# Patient Record
Sex: Female | Born: 1940 | Race: White | Hispanic: No | Marital: Married | Smoking: Never smoker
Health system: Southern US, Community
[De-identification: ages and names within clinical notes are randomized; demographics above are authoritative.]

## PROBLEM LIST (undated history)

## (undated) DIAGNOSIS — J309 Allergic rhinitis, unspecified: Secondary | ICD-10-CM

## (undated) DIAGNOSIS — K5790 Diverticulosis of intestine, part unspecified, without perforation or abscess without bleeding: Secondary | ICD-10-CM

## (undated) DIAGNOSIS — M797 Fibromyalgia: Secondary | ICD-10-CM

## (undated) DIAGNOSIS — E785 Hyperlipidemia, unspecified: Secondary | ICD-10-CM

## (undated) DIAGNOSIS — F329 Major depressive disorder, single episode, unspecified: Secondary | ICD-10-CM

## (undated) DIAGNOSIS — I2510 Atherosclerotic heart disease of native coronary artery without angina pectoris: Secondary | ICD-10-CM

## (undated) DIAGNOSIS — T887XXA Unspecified adverse effect of drug or medicament, initial encounter: Secondary | ICD-10-CM

## (undated) DIAGNOSIS — T4145XA Adverse effect of unspecified anesthetic, initial encounter: Secondary | ICD-10-CM

## (undated) DIAGNOSIS — R0989 Other specified symptoms and signs involving the circulatory and respiratory systems: Secondary | ICD-10-CM

## (undated) DIAGNOSIS — K635 Polyp of colon: Secondary | ICD-10-CM

## (undated) DIAGNOSIS — K219 Gastro-esophageal reflux disease without esophagitis: Secondary | ICD-10-CM

## (undated) DIAGNOSIS — K589 Irritable bowel syndrome without diarrhea: Secondary | ICD-10-CM

## (undated) DIAGNOSIS — R635 Abnormal weight gain: Secondary | ICD-10-CM

## (undated) DIAGNOSIS — T8859XA Other complications of anesthesia, initial encounter: Secondary | ICD-10-CM

## (undated) DIAGNOSIS — K432 Incisional hernia without obstruction or gangrene: Secondary | ICD-10-CM

## (undated) DIAGNOSIS — F32A Depression, unspecified: Secondary | ICD-10-CM

## (undated) DIAGNOSIS — G43909 Migraine, unspecified, not intractable, without status migrainosus: Secondary | ICD-10-CM

## (undated) DIAGNOSIS — R251 Tremor, unspecified: Secondary | ICD-10-CM

## (undated) HISTORY — PX: OTHER SURGICAL HISTORY: SHX169

## (undated) HISTORY — DX: Tremor, unspecified: R25.1

## (undated) HISTORY — DX: Atherosclerotic heart disease of native coronary artery without angina pectoris: I25.10

## (undated) HISTORY — PX: TONSILLECTOMY: SUR1361

## (undated) HISTORY — DX: Incisional hernia without obstruction or gangrene: K43.2

## (undated) HISTORY — DX: Abnormal weight gain: R63.5

## (undated) HISTORY — PX: CATARACT EXTRACTION: SUR2

## (undated) HISTORY — DX: Hyperlipidemia, unspecified: E78.5

## (undated) HISTORY — PX: ABDOMINAL SURGERY: SHX537

## (undated) HISTORY — DX: Migraine, unspecified, not intractable, without status migrainosus: G43.909

## (undated) HISTORY — DX: Other specified symptoms and signs involving the circulatory and respiratory systems: R09.89

## (undated) HISTORY — DX: Irritable bowel syndrome, unspecified: K58.9

## (undated) HISTORY — PX: BLADDER SURGERY: SHX569

## (undated) HISTORY — DX: Unspecified adverse effect of drug or medicament, initial encounter: T88.7XXA

## (undated) HISTORY — DX: Fibromyalgia: M79.7

## (undated) HISTORY — PX: PARTIAL HYSTERECTOMY: SHX80

## (undated) HISTORY — DX: Polyp of colon: K63.5

## (undated) HISTORY — DX: Diverticulosis of intestine, part unspecified, without perforation or abscess without bleeding: K57.90

## (undated) HISTORY — DX: Gastro-esophageal reflux disease without esophagitis: K21.9

## (undated) HISTORY — DX: Allergic rhinitis, unspecified: J30.9

---
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
## 1977-04-08 HISTORY — PX: ABDOMINAL HYSTERECTOMY: SHX81

## 1996-02-09 ENCOUNTER — Encounter: Payer: Self-pay | Admitting: Gastroenterology

## 1997-08-15 ENCOUNTER — Other Ambulatory Visit: Admission: RE | Admit: 1997-08-15 | Discharge: 1997-08-15 | Payer: Self-pay | Admitting: Obstetrics and Gynecology

## 1997-12-13 ENCOUNTER — Inpatient Hospital Stay (HOSPITAL_COMMUNITY): Admission: EM | Admit: 1997-12-13 | Discharge: 1997-12-16 | Payer: Self-pay | Admitting: Emergency Medicine

## 1998-02-07 ENCOUNTER — Encounter (HOSPITAL_COMMUNITY): Admission: RE | Admit: 1998-02-07 | Discharge: 1998-05-08 | Payer: Self-pay | Admitting: Cardiology

## 1998-05-09 ENCOUNTER — Encounter (HOSPITAL_COMMUNITY): Admission: RE | Admit: 1998-05-09 | Discharge: 1998-08-07 | Payer: Self-pay | Admitting: Cardiology

## 1998-08-24 ENCOUNTER — Other Ambulatory Visit: Admission: RE | Admit: 1998-08-24 | Discharge: 1998-08-24 | Payer: Self-pay | Admitting: Obstetrics and Gynecology

## 1998-09-13 ENCOUNTER — Emergency Department (HOSPITAL_COMMUNITY): Admission: EM | Admit: 1998-09-13 | Discharge: 1998-09-13 | Payer: Self-pay | Admitting: Emergency Medicine

## 1998-09-13 ENCOUNTER — Encounter: Payer: Self-pay | Admitting: *Deleted

## 1998-10-11 ENCOUNTER — Other Ambulatory Visit: Admission: RE | Admit: 1998-10-11 | Discharge: 1998-10-11 | Payer: Self-pay | Admitting: Gastroenterology

## 1998-10-11 ENCOUNTER — Encounter (INDEPENDENT_AMBULATORY_CARE_PROVIDER_SITE_OTHER): Payer: Self-pay | Admitting: Specialist

## 1998-10-11 ENCOUNTER — Encounter: Payer: Self-pay | Admitting: Gastroenterology

## 2000-11-16 ENCOUNTER — Encounter: Payer: Self-pay | Admitting: Internal Medicine

## 2000-11-16 ENCOUNTER — Emergency Department (HOSPITAL_COMMUNITY): Admission: EM | Admit: 2000-11-16 | Discharge: 2000-11-16 | Payer: Self-pay | Admitting: Emergency Medicine

## 2000-12-16 ENCOUNTER — Ambulatory Visit (HOSPITAL_COMMUNITY): Admission: RE | Admit: 2000-12-16 | Discharge: 2000-12-16 | Payer: Self-pay | Admitting: Gastroenterology

## 2000-12-16 ENCOUNTER — Encounter: Payer: Self-pay | Admitting: Gastroenterology

## 2000-12-23 ENCOUNTER — Encounter: Payer: Self-pay | Admitting: Gastroenterology

## 2000-12-23 ENCOUNTER — Ambulatory Visit (HOSPITAL_COMMUNITY): Admission: RE | Admit: 2000-12-23 | Discharge: 2000-12-23 | Payer: Self-pay | Admitting: Gastroenterology

## 2000-12-25 ENCOUNTER — Other Ambulatory Visit: Admission: RE | Admit: 2000-12-25 | Discharge: 2000-12-25 | Payer: Self-pay | Admitting: Obstetrics and Gynecology

## 2001-01-03 ENCOUNTER — Emergency Department (HOSPITAL_COMMUNITY): Admission: EM | Admit: 2001-01-03 | Discharge: 2001-01-04 | Payer: Self-pay | Admitting: Emergency Medicine

## 2001-01-05 ENCOUNTER — Observation Stay (HOSPITAL_COMMUNITY): Admission: EM | Admit: 2001-01-05 | Discharge: 2001-01-06 | Payer: Self-pay | Admitting: Emergency Medicine

## 2001-01-06 ENCOUNTER — Encounter: Payer: Self-pay | Admitting: Internal Medicine

## 2001-01-19 ENCOUNTER — Emergency Department (HOSPITAL_COMMUNITY): Admission: EM | Admit: 2001-01-19 | Discharge: 2001-01-19 | Payer: Self-pay | Admitting: Emergency Medicine

## 2001-01-19 ENCOUNTER — Encounter: Payer: Self-pay | Admitting: Emergency Medicine

## 2002-05-18 ENCOUNTER — Ambulatory Visit (HOSPITAL_COMMUNITY): Admission: RE | Admit: 2002-05-18 | Discharge: 2002-05-18 | Payer: Self-pay | Admitting: Gastroenterology

## 2003-03-07 ENCOUNTER — Other Ambulatory Visit: Admission: RE | Admit: 2003-03-07 | Discharge: 2003-03-07 | Payer: Self-pay | Admitting: Obstetrics and Gynecology

## 2003-05-29 ENCOUNTER — Emergency Department (HOSPITAL_COMMUNITY): Admission: EM | Admit: 2003-05-29 | Discharge: 2003-05-29 | Payer: Self-pay | Admitting: Family Medicine

## 2004-03-08 ENCOUNTER — Ambulatory Visit: Payer: Self-pay | Admitting: Internal Medicine

## 2004-04-06 ENCOUNTER — Ambulatory Visit: Payer: Self-pay | Admitting: Internal Medicine

## 2004-04-10 ENCOUNTER — Ambulatory Visit: Payer: Self-pay | Admitting: Internal Medicine

## 2004-08-29 ENCOUNTER — Ambulatory Visit: Payer: Self-pay | Admitting: *Deleted

## 2004-09-06 ENCOUNTER — Ambulatory Visit: Payer: Self-pay | Admitting: Cardiology

## 2004-09-06 ENCOUNTER — Ambulatory Visit: Payer: Self-pay

## 2004-09-24 ENCOUNTER — Encounter: Admission: RE | Admit: 2004-09-24 | Discharge: 2004-09-24 | Payer: Self-pay | Admitting: Urology

## 2004-09-26 ENCOUNTER — Ambulatory Visit (HOSPITAL_COMMUNITY): Admission: RE | Admit: 2004-09-26 | Discharge: 2004-09-26 | Payer: Self-pay | Admitting: Urology

## 2004-09-26 ENCOUNTER — Ambulatory Visit (HOSPITAL_BASED_OUTPATIENT_CLINIC_OR_DEPARTMENT_OTHER): Admission: RE | Admit: 2004-09-26 | Discharge: 2004-09-26 | Payer: Self-pay | Admitting: Urology

## 2005-01-23 ENCOUNTER — Ambulatory Visit: Payer: Self-pay | Admitting: Internal Medicine

## 2005-02-04 ENCOUNTER — Ambulatory Visit: Payer: Self-pay | Admitting: Internal Medicine

## 2005-03-05 ENCOUNTER — Ambulatory Visit: Payer: Self-pay | Admitting: Internal Medicine

## 2005-03-22 ENCOUNTER — Ambulatory Visit: Payer: Self-pay | Admitting: Internal Medicine

## 2005-06-14 ENCOUNTER — Ambulatory Visit: Payer: Self-pay | Admitting: Internal Medicine

## 2005-07-30 ENCOUNTER — Ambulatory Visit: Payer: Self-pay | Admitting: Cardiovascular Disease

## 2005-08-01 ENCOUNTER — Inpatient Hospital Stay (HOSPITAL_BASED_OUTPATIENT_CLINIC_OR_DEPARTMENT_OTHER): Admission: RE | Admit: 2005-08-01 | Discharge: 2005-08-01 | Payer: Self-pay | Admitting: Cardiology

## 2005-08-01 ENCOUNTER — Ambulatory Visit: Payer: Self-pay | Admitting: Cardiology

## 2005-08-07 ENCOUNTER — Encounter: Payer: Self-pay | Admitting: Emergency Medicine

## 2005-08-07 ENCOUNTER — Ambulatory Visit: Payer: Self-pay

## 2005-08-26 ENCOUNTER — Ambulatory Visit: Payer: Self-pay | Admitting: Cardiology

## 2005-09-23 ENCOUNTER — Ambulatory Visit: Payer: Self-pay | Admitting: Internal Medicine

## 2005-09-24 ENCOUNTER — Ambulatory Visit: Payer: Self-pay | Admitting: Internal Medicine

## 2005-10-15 ENCOUNTER — Ambulatory Visit (HOSPITAL_COMMUNITY): Admission: RE | Admit: 2005-10-15 | Discharge: 2005-10-15 | Payer: Self-pay | Admitting: Gastroenterology

## 2005-11-27 ENCOUNTER — Ambulatory Visit: Payer: Self-pay | Admitting: Cardiology

## 2006-01-15 ENCOUNTER — Ambulatory Visit: Payer: Self-pay | Admitting: Cardiology

## 2006-01-21 ENCOUNTER — Ambulatory Visit: Payer: Self-pay | Admitting: Cardiology

## 2006-02-24 ENCOUNTER — Ambulatory Visit: Payer: Self-pay | Admitting: Internal Medicine

## 2006-06-06 ENCOUNTER — Ambulatory Visit: Payer: Self-pay | Admitting: Internal Medicine

## 2006-07-09 ENCOUNTER — Ambulatory Visit: Payer: Self-pay | Admitting: Cardiology

## 2006-07-16 ENCOUNTER — Ambulatory Visit: Payer: Self-pay

## 2006-08-13 ENCOUNTER — Ambulatory Visit: Payer: Self-pay

## 2006-08-13 LAB — CONVERTED CEMR LAB
Albumin: 3.9 g/dL (ref 3.5–5.2)
Bilirubin, Direct: 0.1 mg/dL (ref 0.0–0.3)
HDL: 34.6 mg/dL — ABNORMAL LOW (ref >39.0)
LDL Cholesterol: 50 mg/dL (ref 0–99)
Total Bilirubin: 0.6 mg/dL (ref 0.3–1.2)
Total CHOL/HDL Ratio: 3.3
Total Protein: 6.6 g/dL (ref 6.0–8.3)

## 2006-09-10 ENCOUNTER — Encounter: Payer: Self-pay | Admitting: Cardiology

## 2006-09-17 ENCOUNTER — Encounter: Admission: RE | Admit: 2006-09-17 | Discharge: 2006-09-17 | Payer: Self-pay | Admitting: Gastroenterology

## 2006-10-15 ENCOUNTER — Encounter: Payer: Self-pay | Admitting: Internal Medicine

## 2006-11-03 ENCOUNTER — Encounter: Payer: Self-pay | Admitting: Internal Medicine

## 2006-11-25 ENCOUNTER — Ambulatory Visit: Payer: Self-pay | Admitting: Internal Medicine

## 2006-11-25 DIAGNOSIS — K5730 Diverticulosis of large intestine without perforation or abscess without bleeding: Secondary | ICD-10-CM

## 2006-11-25 DIAGNOSIS — K120 Recurrent oral aphthae: Secondary | ICD-10-CM | POA: Insufficient documentation

## 2006-11-26 ENCOUNTER — Telehealth (INDEPENDENT_AMBULATORY_CARE_PROVIDER_SITE_OTHER): Payer: Self-pay | Admitting: *Deleted

## 2007-02-09 ENCOUNTER — Encounter: Admission: RE | Admit: 2007-02-09 | Discharge: 2007-02-09 | Payer: Self-pay | Admitting: Neurology

## 2007-03-02 ENCOUNTER — Telehealth (INDEPENDENT_AMBULATORY_CARE_PROVIDER_SITE_OTHER): Payer: Self-pay | Admitting: *Deleted

## 2007-03-03 ENCOUNTER — Telehealth (INDEPENDENT_AMBULATORY_CARE_PROVIDER_SITE_OTHER): Payer: Self-pay | Admitting: *Deleted

## 2007-03-11 ENCOUNTER — Encounter: Payer: Self-pay | Admitting: Internal Medicine

## 2007-03-12 ENCOUNTER — Ambulatory Visit: Payer: Self-pay | Admitting: Internal Medicine

## 2007-03-12 DIAGNOSIS — J309 Allergic rhinitis, unspecified: Secondary | ICD-10-CM | POA: Insufficient documentation

## 2007-03-12 DIAGNOSIS — G43909 Migraine, unspecified, not intractable, without status migrainosus: Secondary | ICD-10-CM | POA: Insufficient documentation

## 2007-03-12 DIAGNOSIS — E860 Dehydration: Secondary | ICD-10-CM

## 2007-03-12 DIAGNOSIS — R635 Abnormal weight gain: Secondary | ICD-10-CM

## 2007-03-12 DIAGNOSIS — T887XXA Unspecified adverse effect of drug or medicament, initial encounter: Secondary | ICD-10-CM

## 2007-03-12 DIAGNOSIS — I2109 ST elevation (STEMI) myocardial infarction involving other coronary artery of anterior wall: Secondary | ICD-10-CM | POA: Insufficient documentation

## 2007-03-12 DIAGNOSIS — R0989 Other specified symptoms and signs involving the circulatory and respiratory systems: Secondary | ICD-10-CM

## 2007-03-12 DIAGNOSIS — R197 Diarrhea, unspecified: Secondary | ICD-10-CM | POA: Insufficient documentation

## 2007-03-12 DIAGNOSIS — K5900 Constipation, unspecified: Secondary | ICD-10-CM | POA: Insufficient documentation

## 2007-03-16 LAB — CONVERTED CEMR LAB: TSH: 1.92 microintl units/mL (ref 0.35–5.50)

## 2007-06-30 ENCOUNTER — Ambulatory Visit: Payer: Self-pay | Admitting: Internal Medicine

## 2007-07-07 ENCOUNTER — Telehealth (INDEPENDENT_AMBULATORY_CARE_PROVIDER_SITE_OTHER): Payer: Self-pay | Admitting: *Deleted

## 2007-07-09 ENCOUNTER — Ambulatory Visit (HOSPITAL_COMMUNITY): Admission: RE | Admit: 2007-07-09 | Discharge: 2007-07-09 | Payer: Self-pay | Admitting: Internal Medicine

## 2007-07-10 ENCOUNTER — Ambulatory Visit: Payer: Self-pay | Admitting: Internal Medicine

## 2007-07-10 DIAGNOSIS — R519 Headache, unspecified: Secondary | ICD-10-CM | POA: Insufficient documentation

## 2007-07-10 DIAGNOSIS — I2510 Atherosclerotic heart disease of native coronary artery without angina pectoris: Secondary | ICD-10-CM

## 2007-07-10 DIAGNOSIS — R51 Headache: Secondary | ICD-10-CM

## 2007-07-10 DIAGNOSIS — Z9861 Coronary angioplasty status: Secondary | ICD-10-CM

## 2007-07-16 ENCOUNTER — Ambulatory Visit: Payer: Self-pay | Admitting: Cardiology

## 2007-07-19 LAB — CONVERTED CEMR LAB
ALT: 14 units/L (ref 0–35)
Albumin: 3.6 g/dL (ref 3.5–5.2)
BUN: 11 mg/dL (ref 6–23)
Bilirubin, Direct: 0.1 mg/dL (ref 0.0–0.3)
CO2: 31 meq/L (ref 19–32)
Cholesterol: 120 mg/dL (ref 0–200)
Eosinophils Absolute: 0.1 10*3/uL (ref 0.0–0.7)
HCT: 42.3 % (ref 36.0–46.0)
MCHC: 32.2 g/dL (ref 30.0–36.0)
Monocytes Absolute: 0.7 10*3/uL (ref 0.1–1.0)
Neutro Abs: 3.1 10*3/uL (ref 1.4–7.7)
Neutrophils Relative %: 53.9 % (ref 43.0–77.0)
Platelets: 203 10*3/uL (ref 150–400)
RBC: 4.43 M/uL (ref 3.87–5.11)
TSH: 1.93 microintl units/mL (ref 0.35–5.50)
Total Bilirubin: 0.6 mg/dL (ref 0.3–1.2)
VLDL: 32 mg/dL (ref 0–40)
WBC: 5.7 10*3/uL (ref 4.5–10.5)

## 2007-07-20 ENCOUNTER — Encounter (INDEPENDENT_AMBULATORY_CARE_PROVIDER_SITE_OTHER): Payer: Self-pay | Admitting: *Deleted

## 2007-08-24 ENCOUNTER — Telehealth: Payer: Self-pay | Admitting: Internal Medicine

## 2007-08-26 DIAGNOSIS — K219 Gastro-esophageal reflux disease without esophagitis: Secondary | ICD-10-CM | POA: Insufficient documentation

## 2007-08-27 ENCOUNTER — Ambulatory Visit: Payer: Self-pay | Admitting: Internal Medicine

## 2007-08-27 DIAGNOSIS — K589 Irritable bowel syndrome without diarrhea: Secondary | ICD-10-CM | POA: Insufficient documentation

## 2007-09-01 ENCOUNTER — Ambulatory Visit: Payer: Self-pay | Admitting: Internal Medicine

## 2007-09-03 ENCOUNTER — Encounter: Payer: Self-pay | Admitting: Internal Medicine

## 2007-09-04 ENCOUNTER — Encounter: Payer: Self-pay | Admitting: Internal Medicine

## 2007-09-17 ENCOUNTER — Telehealth (INDEPENDENT_AMBULATORY_CARE_PROVIDER_SITE_OTHER): Payer: Self-pay | Admitting: *Deleted

## 2007-09-17 ENCOUNTER — Encounter: Payer: Self-pay | Admitting: Internal Medicine

## 2007-10-02 ENCOUNTER — Telehealth (INDEPENDENT_AMBULATORY_CARE_PROVIDER_SITE_OTHER): Payer: Self-pay | Admitting: *Deleted

## 2007-10-30 ENCOUNTER — Telehealth (INDEPENDENT_AMBULATORY_CARE_PROVIDER_SITE_OTHER): Payer: Self-pay | Admitting: *Deleted

## 2007-12-17 ENCOUNTER — Telehealth (INDEPENDENT_AMBULATORY_CARE_PROVIDER_SITE_OTHER): Payer: Self-pay | Admitting: *Deleted

## 2008-01-06 ENCOUNTER — Ambulatory Visit: Payer: Self-pay | Admitting: Cardiology

## 2008-01-21 ENCOUNTER — Encounter: Payer: Self-pay | Admitting: Internal Medicine

## 2008-02-04 ENCOUNTER — Encounter: Payer: Self-pay | Admitting: Internal Medicine

## 2008-02-23 ENCOUNTER — Telehealth (INDEPENDENT_AMBULATORY_CARE_PROVIDER_SITE_OTHER): Payer: Self-pay | Admitting: *Deleted

## 2008-03-15 ENCOUNTER — Ambulatory Visit: Payer: Self-pay | Admitting: Internal Medicine

## 2008-03-25 ENCOUNTER — Emergency Department (HOSPITAL_COMMUNITY): Admission: EM | Admit: 2008-03-25 | Discharge: 2008-03-25 | Payer: Self-pay | Admitting: Emergency Medicine

## 2008-03-28 ENCOUNTER — Encounter: Payer: Self-pay | Admitting: Internal Medicine

## 2008-06-08 ENCOUNTER — Telehealth: Payer: Self-pay | Admitting: Internal Medicine

## 2008-06-08 DIAGNOSIS — N6019 Diffuse cystic mastopathy of unspecified breast: Secondary | ICD-10-CM

## 2008-06-14 ENCOUNTER — Encounter: Payer: Self-pay | Admitting: Internal Medicine

## 2008-06-15 ENCOUNTER — Encounter: Payer: Self-pay | Admitting: Internal Medicine

## 2008-06-29 ENCOUNTER — Encounter: Payer: Self-pay | Admitting: Internal Medicine

## 2008-07-11 ENCOUNTER — Ambulatory Visit: Payer: Self-pay | Admitting: Cardiology

## 2008-07-11 ENCOUNTER — Encounter: Payer: Self-pay | Admitting: Cardiology

## 2008-07-11 DIAGNOSIS — E785 Hyperlipidemia, unspecified: Secondary | ICD-10-CM

## 2008-07-14 ENCOUNTER — Ambulatory Visit: Payer: Self-pay | Admitting: Internal Medicine

## 2008-07-14 DIAGNOSIS — F411 Generalized anxiety disorder: Secondary | ICD-10-CM

## 2008-07-15 ENCOUNTER — Ambulatory Visit: Payer: Self-pay | Admitting: Internal Medicine

## 2008-07-17 LAB — CONVERTED CEMR LAB
ALT: 24 units/L (ref 0–35)
AST: 20 units/L (ref 0–37)
Alkaline Phosphatase: 64 units/L (ref 39–117)
Bilirubin, Direct: 0.1 mg/dL (ref 0.0–0.3)
Direct LDL: 111.1 mg/dL
Hgb A1c MFr Bld: 5.4 % (ref 4.6–6.5)
Potassium: 4.2 meq/L (ref 3.5–5.1)
Total Protein: 6.9 g/dL (ref 6.0–8.3)
Triglycerides: 201 mg/dL — ABNORMAL HIGH (ref 0.0–149.0)
VLDL: 40.2 mg/dL — ABNORMAL HIGH (ref 0.0–40.0)

## 2008-07-18 ENCOUNTER — Encounter (INDEPENDENT_AMBULATORY_CARE_PROVIDER_SITE_OTHER): Payer: Self-pay | Admitting: *Deleted

## 2008-08-02 ENCOUNTER — Encounter: Payer: Self-pay | Admitting: Internal Medicine

## 2008-08-11 ENCOUNTER — Encounter: Payer: Self-pay | Admitting: Internal Medicine

## 2008-08-16 ENCOUNTER — Encounter: Payer: Self-pay | Admitting: Internal Medicine

## 2008-08-22 ENCOUNTER — Encounter: Payer: Self-pay | Admitting: Internal Medicine

## 2008-09-22 ENCOUNTER — Encounter: Payer: Self-pay | Admitting: Internal Medicine

## 2008-09-26 ENCOUNTER — Ambulatory Visit: Payer: Self-pay | Admitting: Internal Medicine

## 2008-09-26 DIAGNOSIS — D179 Benign lipomatous neoplasm, unspecified: Secondary | ICD-10-CM | POA: Insufficient documentation

## 2008-10-06 ENCOUNTER — Telehealth (INDEPENDENT_AMBULATORY_CARE_PROVIDER_SITE_OTHER): Payer: Self-pay | Admitting: *Deleted

## 2008-10-21 ENCOUNTER — Ambulatory Visit: Payer: Self-pay | Admitting: Internal Medicine

## 2008-10-30 LAB — CONVERTED CEMR LAB
Cholesterol: 175 mg/dL (ref 0–200)
LDL Cholesterol: 107 mg/dL — ABNORMAL HIGH (ref 0–99)
Triglycerides: 151 mg/dL — ABNORMAL HIGH (ref 0.0–149.0)
VLDL: 30.2 mg/dL (ref 0.0–40.0)

## 2008-10-31 ENCOUNTER — Telehealth: Payer: Self-pay | Admitting: Cardiology

## 2008-11-02 ENCOUNTER — Encounter (INDEPENDENT_AMBULATORY_CARE_PROVIDER_SITE_OTHER): Payer: Self-pay | Admitting: *Deleted

## 2008-11-16 ENCOUNTER — Ambulatory Visit: Payer: Self-pay | Admitting: Internal Medicine

## 2008-11-16 DIAGNOSIS — S8010XA Contusion of unspecified lower leg, initial encounter: Secondary | ICD-10-CM

## 2008-11-18 ENCOUNTER — Telehealth (INDEPENDENT_AMBULATORY_CARE_PROVIDER_SITE_OTHER): Payer: Self-pay | Admitting: *Deleted

## 2008-11-25 ENCOUNTER — Telehealth (INDEPENDENT_AMBULATORY_CARE_PROVIDER_SITE_OTHER): Payer: Self-pay | Admitting: *Deleted

## 2008-11-25 ENCOUNTER — Ambulatory Visit: Payer: Self-pay | Admitting: Internal Medicine

## 2008-11-30 ENCOUNTER — Telehealth (INDEPENDENT_AMBULATORY_CARE_PROVIDER_SITE_OTHER): Payer: Self-pay | Admitting: *Deleted

## 2008-12-15 ENCOUNTER — Telehealth (INDEPENDENT_AMBULATORY_CARE_PROVIDER_SITE_OTHER): Payer: Self-pay | Admitting: *Deleted

## 2009-01-05 ENCOUNTER — Encounter: Payer: Self-pay | Admitting: Internal Medicine

## 2009-01-30 ENCOUNTER — Ambulatory Visit: Payer: Self-pay | Admitting: Internal Medicine

## 2009-01-30 DIAGNOSIS — M79609 Pain in unspecified limb: Secondary | ICD-10-CM

## 2009-02-02 ENCOUNTER — Ambulatory Visit: Payer: Self-pay | Admitting: Internal Medicine

## 2009-02-03 ENCOUNTER — Encounter (INDEPENDENT_AMBULATORY_CARE_PROVIDER_SITE_OTHER): Payer: Self-pay | Admitting: *Deleted

## 2009-02-03 ENCOUNTER — Encounter: Payer: Self-pay | Admitting: Internal Medicine

## 2009-02-03 LAB — CONVERTED CEMR LAB
ALT: 17 units/L (ref 0–35)
Bilirubin, Direct: 0 mg/dL (ref 0.0–0.3)
Direct LDL: 86.7 mg/dL
HDL: 33.2 mg/dL — ABNORMAL LOW (ref >39.00)
Total Bilirubin: 0.5 mg/dL (ref 0.3–1.2)

## 2009-02-06 ENCOUNTER — Ambulatory Visit: Payer: Self-pay

## 2009-02-06 ENCOUNTER — Encounter: Payer: Self-pay | Admitting: Internal Medicine

## 2009-02-09 ENCOUNTER — Ambulatory Visit: Payer: Self-pay | Admitting: Internal Medicine

## 2009-02-13 ENCOUNTER — Encounter (INDEPENDENT_AMBULATORY_CARE_PROVIDER_SITE_OTHER): Payer: Self-pay | Admitting: *Deleted

## 2009-02-15 ENCOUNTER — Encounter: Payer: Self-pay | Admitting: Internal Medicine

## 2009-02-21 ENCOUNTER — Telehealth: Payer: Self-pay | Admitting: Internal Medicine

## 2009-02-22 ENCOUNTER — Telehealth: Payer: Self-pay | Admitting: Internal Medicine

## 2009-02-23 ENCOUNTER — Telehealth: Payer: Self-pay | Admitting: Internal Medicine

## 2009-02-23 ENCOUNTER — Ambulatory Visit: Payer: Self-pay | Admitting: Internal Medicine

## 2009-04-08 HISTORY — PX: COLONOSCOPY: SHX174

## 2009-04-14 ENCOUNTER — Ambulatory Visit: Payer: Self-pay | Admitting: Internal Medicine

## 2009-05-08 ENCOUNTER — Telehealth: Payer: Self-pay | Admitting: Internal Medicine

## 2009-05-11 ENCOUNTER — Telehealth: Payer: Self-pay | Admitting: Internal Medicine

## 2009-05-15 ENCOUNTER — Ambulatory Visit: Payer: Self-pay | Admitting: Internal Medicine

## 2009-05-16 LAB — CONVERTED CEMR LAB
ALT: 18 units/L (ref 0–35)
AST: 16 units/L (ref 0–37)
Albumin: 3.5 g/dL (ref 3.5–5.2)
Alkaline Phosphatase: 65 units/L (ref 39–117)
BUN: 10 mg/dL (ref 6–23)
Bilirubin, Direct: 0.1 mg/dL (ref 0.0–0.3)
HDL: 41.1 mg/dL (ref >39.00)
LDL Cholesterol: 40 mg/dL (ref 0–99)
Total Bilirubin: 0.5 mg/dL (ref 0.3–1.2)
Total CHOL/HDL Ratio: 3
Total Protein: 6.7 g/dL (ref 6.0–8.3)
VLDL: 25.2 mg/dL (ref 0.0–40.0)

## 2009-05-19 ENCOUNTER — Ambulatory Visit: Payer: Self-pay | Admitting: Internal Medicine

## 2009-05-19 DIAGNOSIS — R7309 Other abnormal glucose: Secondary | ICD-10-CM

## 2009-05-23 ENCOUNTER — Telehealth (INDEPENDENT_AMBULATORY_CARE_PROVIDER_SITE_OTHER): Payer: Self-pay | Admitting: *Deleted

## 2009-05-24 ENCOUNTER — Telehealth: Payer: Self-pay | Admitting: Cardiology

## 2009-05-31 ENCOUNTER — Ambulatory Visit: Payer: Self-pay | Admitting: Cardiology

## 2009-06-16 ENCOUNTER — Encounter: Payer: Self-pay | Admitting: Internal Medicine

## 2009-07-12 ENCOUNTER — Telehealth (INDEPENDENT_AMBULATORY_CARE_PROVIDER_SITE_OTHER): Payer: Self-pay | Admitting: Radiology

## 2009-07-12 ENCOUNTER — Encounter: Payer: Self-pay | Admitting: Cardiology

## 2009-07-17 ENCOUNTER — Encounter: Payer: Self-pay | Admitting: Internal Medicine

## 2009-08-14 ENCOUNTER — Ambulatory Visit: Payer: Self-pay | Admitting: Internal Medicine

## 2009-08-14 DIAGNOSIS — I998 Other disorder of circulatory system: Secondary | ICD-10-CM | POA: Insufficient documentation

## 2009-08-14 DIAGNOSIS — L989 Disorder of the skin and subcutaneous tissue, unspecified: Secondary | ICD-10-CM | POA: Insufficient documentation

## 2009-08-28 ENCOUNTER — Encounter: Payer: Self-pay | Admitting: Internal Medicine

## 2009-09-07 ENCOUNTER — Telehealth (INDEPENDENT_AMBULATORY_CARE_PROVIDER_SITE_OTHER): Payer: Self-pay | Admitting: *Deleted

## 2009-09-08 ENCOUNTER — Telehealth (INDEPENDENT_AMBULATORY_CARE_PROVIDER_SITE_OTHER): Payer: Self-pay | Admitting: *Deleted

## 2009-09-14 ENCOUNTER — Telehealth (INDEPENDENT_AMBULATORY_CARE_PROVIDER_SITE_OTHER): Payer: Self-pay | Admitting: *Deleted

## 2009-09-15 ENCOUNTER — Ambulatory Visit: Payer: Self-pay | Admitting: Internal Medicine

## 2009-09-18 LAB — CONVERTED CEMR LAB
BUN: 12 mg/dL (ref 6–23)
Eosinophils Absolute: 0.1 10*3/uL (ref 0.0–0.7)
Eosinophils Relative: 1.4 % (ref 0.0–5.0)
HCT: 40.7 % (ref 36.0–46.0)
Hemoglobin: 13.9 g/dL (ref 12.0–15.0)
Lymphocytes Relative: 29.2 % (ref 12.0–46.0)
Lymphs Abs: 2.3 10*3/uL (ref 0.7–4.0)
Monocytes Relative: 13.2 % — ABNORMAL HIGH (ref 3.0–12.0)
RBC: 4.07 M/uL (ref 3.87–5.11)

## 2009-09-21 ENCOUNTER — Encounter: Payer: Self-pay | Admitting: Internal Medicine

## 2009-11-09 ENCOUNTER — Telehealth: Payer: Self-pay | Admitting: Cardiology

## 2009-12-26 ENCOUNTER — Ambulatory Visit: Payer: Self-pay | Admitting: Internal Medicine

## 2009-12-27 ENCOUNTER — Telehealth: Payer: Self-pay | Admitting: Cardiology

## 2010-01-03 ENCOUNTER — Encounter (INDEPENDENT_AMBULATORY_CARE_PROVIDER_SITE_OTHER): Payer: Self-pay | Admitting: *Deleted

## 2010-01-05 ENCOUNTER — Telehealth (INDEPENDENT_AMBULATORY_CARE_PROVIDER_SITE_OTHER): Payer: Self-pay | Admitting: *Deleted

## 2010-01-16 ENCOUNTER — Ambulatory Visit: Payer: Self-pay | Admitting: Internal Medicine

## 2010-01-16 DIAGNOSIS — E559 Vitamin D deficiency, unspecified: Secondary | ICD-10-CM | POA: Insufficient documentation

## 2010-01-16 LAB — CONVERTED CEMR LAB: Rapid Strep: NEGATIVE

## 2010-01-18 ENCOUNTER — Ambulatory Visit: Payer: Self-pay | Admitting: Internal Medicine

## 2010-01-19 LAB — CONVERTED CEMR LAB
Basophils Absolute: 0 10*3/uL (ref 0.0–0.1)
Basophils Relative: 0.3 % (ref 0.0–3.0)
Eosinophils Absolute: 0.1 10*3/uL (ref 0.0–0.7)
Lymphocytes Relative: 24.5 % (ref 12.0–46.0)
Lymphs Abs: 2.3 10*3/uL (ref 0.7–4.0)
MCV: 100.7 fL — ABNORMAL HIGH (ref 78.0–100.0)
Monocytes Absolute: 1.1 10*3/uL — ABNORMAL HIGH (ref 0.1–1.0)
Monocytes Relative: 11.4 % (ref 3.0–12.0)
Platelets: 201 10*3/uL (ref 150.0–400.0)
Vit D, 25-Hydroxy: 60 ng/mL (ref 30–89)

## 2010-01-26 ENCOUNTER — Telehealth: Payer: Self-pay | Admitting: Internal Medicine

## 2010-01-30 ENCOUNTER — Telehealth: Payer: Self-pay | Admitting: Cardiology

## 2010-01-31 ENCOUNTER — Encounter: Payer: Self-pay | Admitting: Internal Medicine

## 2010-02-01 DIAGNOSIS — I517 Cardiomegaly: Secondary | ICD-10-CM

## 2010-02-07 ENCOUNTER — Telehealth: Payer: Self-pay | Admitting: Internal Medicine

## 2010-02-12 ENCOUNTER — Ambulatory Visit: Payer: Self-pay

## 2010-02-12 ENCOUNTER — Encounter: Payer: Self-pay | Admitting: Cardiology

## 2010-02-12 ENCOUNTER — Ambulatory Visit (HOSPITAL_COMMUNITY)
Admission: RE | Admit: 2010-02-12 | Discharge: 2010-02-12 | Payer: Self-pay | Source: Home / Self Care | Admitting: Cardiology

## 2010-02-12 ENCOUNTER — Telehealth (INDEPENDENT_AMBULATORY_CARE_PROVIDER_SITE_OTHER): Payer: Self-pay | Admitting: *Deleted

## 2010-02-20 ENCOUNTER — Telehealth: Payer: Self-pay | Admitting: Cardiology

## 2010-03-06 ENCOUNTER — Telehealth: Payer: Self-pay | Admitting: Cardiology

## 2010-03-07 ENCOUNTER — Encounter: Payer: Self-pay | Admitting: Cardiology

## 2010-03-16 ENCOUNTER — Telehealth: Payer: Self-pay | Admitting: Internal Medicine

## 2010-03-23 ENCOUNTER — Telehealth: Payer: Self-pay | Admitting: Internal Medicine

## 2010-05-10 NOTE — Progress Notes (Signed)
Summary: tigan refill due to eye surgery--needs to be called in today  Phone Note Refill Request Message from:  Patient on March 16, 2010 3:54 PM  Refills Requested: Medication #1:  TIGAN 300 MG  CAPS as needed nausea Had cataract surgery on Tuesday=dr Stonecipher---med tech says symptoms of vision changing can cause patient to be dizzy and nauseous---[facility] [address]  Initial call taken by: Jerolyn Shin,  March 16, 2010 3:56 PM  Follow-up for Phone Call        Patient notified. Follow-up by: Lucious Groves CMA,  March 16, 2010 4:19 PM    Prescriptions: TIGAN 300 MG  CAPS (TRIMETHOBENZAMIDE HCL) as needed nausea  #30 Each x 0   Entered by:   Lucious Groves CMA   Authorized by:   Marga Melnick MD   Signed by:   Lucious Groves CMA on 03/16/2010   Method used:   Faxed to ...       [facility]* (retail)       [address]       [place], [place]  [id number]       Ph: [phone]       Fax: [phone]   RxID:   [id number]

## 2010-05-10 NOTE — Assessment & Plan Note (Signed)
Summary: cough/cbs   Vital Signs:  Patient profile:   70 year old female Weight:      160 pounds BMI:     30.34 Temp:     99.1 degrees F oral Pulse rate:   76 / minute Resp:     15 per minute BP sitting:   112 / 68  (left arm) Cuff size:   large  Vitals Entered By: Shonna Chock CMA (December 26, 2009 12:12 PM) CC: Cough x 1 week, URI symptoms   Primary Care Provider:  Marga Melnick MD  CC:  Cough x 1 week and URI symptoms.  History of Present Illness: URI Symptoms      This is a 70 year old woman who presents with URI symptoms over 5 days.  The patient reports nasal congestion and productive cough with green sputum, but denies significant   purulent nasal discharge until  this am, sore throat, and earache.  Associated symptoms include low-grade fever (<100.5 degrees).  The patient denies dyspnea and wheezing.  The patient also reports R frontal  headache.  & R  unilateral facial pain and tooth pain.  The patient denies the following risk factors for Strep sinusitis: tender adenopathy.  Rx: Neti pot . She is on Keflex for Rosacea.  Current Medications (verified): 1)  Lorazepam 2 Mg  Tabs (Lorazepam) .Marland Kitchen.. 1 Po Am, 1 1/2 @ Night: Prescribed By Dr Jennelle Human Only 2)  Nexium 40 Mg  Pack (Esomeprazole Magnesium) .... Take One Tablet Once A Day 3)  Coq10 100mg  .... 1 By Mouth Two Times A Day 4)  Vit B .... 1 By Mouth Two Times A Day 5)  Fish Oil 1000 Mg Caps (Omega-3 Fatty Acids) .Marland Kitchen.. 1 By Mouth Once Daily 6)  Zyprexa 2.5 Mg  Tabs (Olanzapine) .... Take 1 Tablet By Mouth Once A Day Except 1/2 T/th/sat 7)  Tigan 300 Mg  Caps (Trimethobenzamide Hcl) .... As Needed Nausea 8)  Melatonin 1mg  .... As Needed 9)  Benadryl .... Prn 10)  Bupropion 300mg  .... 1 Tab Qd 11)  Calcium 500mg  .... 1 Daily 12)  Depakote 125mg  .... 1 Tab Bid 13)  Pepcid Ac .... Prn 14)  Singulair 10 Mg Tabs (Montelukast Sodium) .Marland Kitchen.. 1 Once Daily 15)  Crestor 20 Mg Tabs (Rosuvastatin Calcium) .Marland Kitchen.. 1 Once Daily Except 1/2  Tues , Th  & Sat 16)  Metformin Hcl 500 Mg Xr24h-Tab (Metformin Hcl) .Marland Kitchen.. 1 Once Daily With Largest Meal **labs Due** 17)  Diclofenac Sodium Cr 100 Mg Xr24h-Tab (Diclofenac Sodium) .Marland Kitchen.. 1 By Mouth Once Daily As Needed 18)  Zetia 10 Mg Tabs (Ezetimibe) .Marland Kitchen.. 1 By Mouth At Bedtime 19)  Tylenol Extra Strength 500 Mg Tabs (Acetaminophen) .... As Needed (Almost Daily) 20)  Excedrin Migraine 250-250-65 Mg Tabs (Aspirin-Acetaminophen-Caffeine) .... As Needed  Allergies: 1)  ! * Emycin 2)  ! * [cm 3)  ! * Pain Meds 4)  ! Zithromax 5)  ! * Dynabac 6)  ! Thorazine 7)  ! Lidocaine 8)  ! Keflex 9)  ! Levsin 10)  ! * Adderall 11)  ! * Ketek 12)  ! * Topamax 13)  ! Flagyl 14)  ! Cipro 15)  ! Penicillin 16)  ! Lipitor 17)  ! Doxycycline 18)  ! Zetia (Ezetimibe)  Physical Exam  General:  in no acute distress; alert,appropriate and cooperative throughout examination Eyes:  No corneal or conjunctival inflammation noted. EOMI. Perrla.  Vision grossly normal. Ears:  External ear exam shows no  significant lesions or deformities.  Otoscopic examination reveals clear canals, tympanic membranes are intact bilaterally without bulging, retraction, inflammation or discharge. Hearing is grossly normal bilaterally. Nose:  External nasal examination shows no deformity or inflammation. Nasal mucosa are pink and moist without lesions or exudates. Mouth:  Oral mucosa and oropharynx without lesions or exudates.  Teeth in good repair. Lungs:  Normal respiratory effort, chest expands symmetrically. Lungs are clear to auscultation, no crackles or wheezes. Skin:  Mild Rosacae of maxilla Cervical Nodes:  No lymphadenopathy noted Axillary Nodes:  No palpable lymphadenopathy   Impression & Recommendations:  Problem # 1:  SINUSITIS- ACUTE-NOS (ICD-461.9)  Her updated medication list for this problem includes:    Smz-tmp Ds 800-160 Mg Tabs (Sulfamethoxazole-trimethoprim) .Marland Kitchen... 1 two times a day with 8 oz of  water  Problem # 2:  BRONCHITIS-ACUTE (ICD-466.0)  Her updated medication list for this problem includes:    Singulair 10 Mg Tabs (Montelukast sodium) .Marland Kitchen... 1 once daily    Smz-tmp Ds 800-160 Mg Tabs (Sulfamethoxazole-trimethoprim) .Marland Kitchen... 1 two times a day with 8 oz of water  Complete Medication List: 1)  Lorazepam 2 Mg Tabs (Lorazepam) .Marland Kitchen.. 1 po am, 1 1/2 @ night: prescribed by dr cottle only 2)  Nexium 40 Mg Pack (Esomeprazole magnesium) .... Take one tablet once a day 3)  Coq10 100mg   .... 1 by mouth two times a day 4)  Vit B  .... 1 by mouth two times a day 5)  Fish Oil 1000 Mg Caps (Omega-3 fatty acids) .Marland Kitchen.. 1 by mouth once daily 6)  Zyprexa 2.5 Mg Tabs (Olanzapine) .... Take 1 tablet by mouth once a day except 1/2 t/th/sat 7)  Tigan 300 Mg Caps (Trimethobenzamide hcl) .... As needed nausea 8)  Melatonin 1mg   .... As needed 9)  Benadryl  .... Prn 10)  Bupropion 300mg   .... 1 tab qd 11)  Calcium 500mg   .... 1 daily 12)  Depakote 125mg   .... 1 tab bid 13)  Pepcid Ac  .... Prn 14)  Singulair 10 Mg Tabs (Montelukast sodium) .Marland Kitchen.. 1 once daily 15)  Crestor 20 Mg Tabs (Rosuvastatin calcium) .Marland Kitchen.. 1 once daily except 1/2 tues , th  & sat 16)  Metformin Hcl 500 Mg Xr24h-tab (Metformin hcl) .Marland Kitchen.. 1 once daily with largest meal **labs due** 17)  Diclofenac Sodium Cr 100 Mg Xr24h-tab (Diclofenac sodium) .Marland Kitchen.. 1 by mouth once daily as needed 18)  Zetia 10 Mg Tabs (Ezetimibe) .Marland Kitchen.. 1 by mouth at bedtime 19)  Tylenol Extra Strength 500 Mg Tabs (Acetaminophen) .... As needed (almost daily) 20)  Excedrin Migraine 250-250-65 Mg Tabs (Aspirin-acetaminophen-caffeine) .... As needed 21)  Smz-tmp Ds 800-160 Mg Tabs (Sulfamethoxazole-trimethoprim) .Marland Kitchen.. 1 two times a day with 8 oz of water  Patient Instructions: 1)  Hold Keflex while on Sulfa.Use Nasonex two times a day to nostrils. 2)  Drink as much NON dairy  fluid as you can tolerate for the next few days. Prescriptions: SMZ-TMP DS 800-160 MG TABS  (SULFAMETHOXAZOLE-TRIMETHOPRIM) 1 two times a day with 8 oz of water  #20 x 0   Entered and Authorized by:   Marga Melnick MD   Signed by:   Marga Melnick MD on 12/26/2009   Method used:   Faxed to ...       [facility]* (retail)       [address]       [place], [place]  [id number]       Ph: [phone]  Fax: [phone]   RxID:   [id number]

## 2010-05-10 NOTE — Assessment & Plan Note (Signed)
Summary: 1 YR F/U      Allergies Added:   Visit Type:  Follow-up 1 year fu Primary Provider:  Marga Melnick MD  CC:  pt fell and is going to have leg looked a this Friday.  History of Present Illness: The patient is 70 years old and return for management of CAD. 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 Her last catheterization was in 2007 at which time she had 70% narrowing in the ostial to proximal circumflex artery. She has normal LV function. She had a negative Myoview scan after that catheterization procedure.  She's been doing fairly well. She denied chest pain but she does have shortness breath with exertion. She has tried to start dysphagia more exercise recently.  Her other major problem is hyperlipidemia with low HDL but she had a maximum lipid profile recently. Her total cholesterol is 106, her HDL was 41, and her LDL was 40. Other problems include GERD and fibromyalgia. She's been in town for multiple medications including aspirin and Plavix.  Current Medications (verified): 1)  Lorazepam 2 Mg  Tabs (Lorazepam) .Marland Kitchen.. 1 Po Am, 1 1/2 @ Night: Prescribed By Dr Jennelle Human Only 2)  Nexium 40 Mg  Pack (Esomeprazole Magnesium) .... Take One Tablet Once A Day 3)  Otc Niacin .Marland Kitchen.. 1 Tab Daily 4)  Coq10 100mg  .... 1 By Mouth Two Times A Day 5)  Vit B .... 1 By Mouth Two Times A Day 6)  Vit D 1,000mg  .... 2 Qd 7)  Fish Oil 1000 Mg Caps (Omega-3 Fatty Acids) .Marland Kitchen.. 1 By Mouth Once Daily 8)  Gabapentin 100 Mg  Caps (Gabapentin) .... As Needed Pain Can Take Up To 3 Tablets Daily 9)  Zyprexa 2.5 Mg  Tabs (Olanzapine) .... Take 1 Tablet By Mouth Once A Day 10)  Tigan 300 Mg  Caps (Trimethobenzamide Hcl) .... As Needed Nausea 11)  Melatonin 1mg  .... As Needed 12)  Benadryl .... Prn 13)  Bupropion 300mg  .... 1 Tab Qd 14)  Calcium 500mg  .... 1 Daily 15)  Depakote 125mg  .... 1 Tab Bid 16)  Pepcid Ac .... Prn 17)  Singulair 10 Mg Tabs (Montelukast Sodium) .Marland Kitchen.. 1 Once Daily 18)  Crestor 20  Mg Tabs (Rosuvastatin Calcium) .Marland Kitchen.. 1 Once Daily Except 1/2 Tues , Th  & Sat 19)  Anusol-Hc 25 Mg  Supp (Hydrocortisone Acetate) .... Anusol One Hs 20)  Metformin Hcl 500 Mg Xr24h-Tab (Metformin Hcl) .Marland Kitchen.. 1 Once Daily With Largest Meal  Allergies (verified): 1)  ! * Emycin 2)  ! * [cm 3)  ! * Pain Meds 4)  ! Zithromax 5)  ! * Dynabac 6)  ! Thorazine 7)  ! Lidocaine 8)  ! Keflex 9)  ! Levsin 10)  ! * Adderall 11)  ! * Ketek 12)  ! * Topamax 13)  ! Flagyl 14)  ! Cipro 15)  ! Penicillin 16)  ! Lipitor 17)  ! Doxycycline 18)  ! Zetia (Ezetimibe)  Past History:  Past Medical History: Reviewed history from 05/19/2009 and no changes required. 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 2. Hyperlipidemia with low HDL. 3. INTOLERANCE TO ASPIRIN AND PLAVIX. 4. Irritable bowel syndrome. 5. Gastroesophageal reflux disease. 6. Fibromyalgia. GERD FIBROMYALGIA Irritable Bowel Syndrome  Review of Systems       ROS is negative except as outlined in HPI.   Vital Signs:  Patient profile:   70 year old female Height:      61 inches Weight:      167 pounds Pulse rate:   80 / minute BP sitting:   129 / 74  (left arm) Cuff size:   large  Vitals Entered By: Burnett Kanaris, CNA (May 31, 2009 3:29 PM)  Physical Exam  Additional Exam:  Gen. Well-nourished, in no distress   Neck: No JVD, thyroid not enlarged, no carotid bruits Lungs: No tachypnea,  clear without rales, rhonchi or wheezes Cardiovascular: Rhythm regular, PMI not displaced,  heart sounds  normal, no murmurs or gallops, no peripheral edema, pulses normal in all 4 extremities. Abdomen: BS normal, abdomen soft and non-tender without masses or organomegaly, no hepatosplenomegaly. MS: No deformities, no cyanosis or clubbing   Neuro:  No focal sns   Skin:  no lesions    Impression & Recommendations:  Problem # 1:  CAD (ICD-414.00) She had a remote DMI and has 70% stenosis in her circumflex artery at last catheterization in 2007. She does have shortness of breath and is going on a trip and we plan to evaluate her to see if this is an ischemic equivalent. We'll plan to deliver a stress adenosine Myoview scan. If this is negative we'll plan followup in a year. Orders: EKG w/ Interpretation (93000) Nuclear Stress Test (Nuc Stress Test)  Problem # 2:  HYPERLIPIDEMIA-MIXED (ICD-272.4) She has a history of low HDL but her last lipid profile was quite good with an HDL of 41. We will continue current therapy. Her updated medication list for this problem includes:    Crestor 20 Mg Tabs (Rosuvastatin calcium) .Marland Kitchen... 1 once daily except 1/2 tues , th  & sat  Patient Instructions: 1)  Your physician wants you to follow-up in: 1 year with Dr. Shirlee Latch. You will receive a reminder letter in the mail two months in advance. If you don't receive a letter, please call our office to schedule the follow-up appointment. 2)  Your physician has requested that you have an exercise stress myoview.  For further information please visit [email or web address]  Please follow instruction sheet, as given.

## 2010-05-10 NOTE — Letter (Signed)
Summary: [facility]  [facility]   Imported By: Lanelle Bal 03/29/2010 12:26:07  _____________________________________________________________________  External Attachment:    Type:   Image     Comment:   External Document

## 2010-05-10 NOTE — Progress Notes (Signed)
Summary: abdominal pain  Phone Note Call from Patient Call back at Home Phone [phone]   Caller: Patient Summary of Call: patient has had abdominal pain since monday [id number]- she had diverticulitis years ago - she also started taking metformin & it gave her dirrehea - she said she hasnt taken it since monday Initial call taken by: Okey Regal Spring,  September 14, 2009 3:37 PM  Follow-up for Phone Call        spoke with pt who has not been taking metfromin since monday; still has abdominal pain, loose stool gotten better, ov scheduled .Kandice Hams  September 14, 2009 4:08 PM  Follow-up by: Kandice Hams,  September 14, 2009 4:08 PM

## 2010-05-10 NOTE — Progress Notes (Signed)
Summary: problems w/ med   Phone Note Call from Patient   Caller: Patient Summary of Call: Patient called says that antibiotic was causing some GI upset such as reflux she only had 1 day left day she has stopped medication now feels better just w/ dry cough will continue w/ recommendations and call if no better in 1 week  Initial call taken by: Doristine Devoid CMA,  January 05, 2010 3:45 PM   New Allergies: ! * SULFAMETHOXAZOLE New Allergies: ! * SULFAMETHOXAZOLE

## 2010-05-10 NOTE — Progress Notes (Signed)
Summary: ?lab order  Phone Note Call from Patient   Summary of Call: Patient is going to lab in am for repeat lipid panel.  states that she also needs to have labs drawn before starting metformin..... does she need alt, ast?  a1c? Doristine Devoid  May 11, 2009 4:51 PM discussed with hop also bun, crea, K, hepatic Shary Decamp  May 11, 2009 5:05 PM

## 2010-05-10 NOTE — Progress Notes (Signed)
Summary: LAB RESULTS   Phone Note Call from Patient Call back at Home Phone [phone]   Caller: Patient Reason for Call: Talk to Nurse, Lab or Test Results Summary of Call: RE ECHO RESULTS Initial call taken by: Roe Coombs,  February 20, 2010 1:22 PM  Follow-up for Phone Call        Phone Call Completed PT AWARE ECHO LOOKS OKAY INFORMED NOT REVIEWED BY DR Juanda Chance  AT THIIS TIME IF ANY ADDITIONAL INFO GIVEN WILL CALL BACK PT VERBALIZED UNDERSTANDING  Follow-up by: Scherrie Bateman, LPN,  February 20, 2010 1:37 PM

## 2010-05-10 NOTE — Progress Notes (Signed)
Summary: Refill Request  Phone Note Refill Request Call back at Home Phone [phone] Message from:  Patient  Refills Requested: Medication #1:  CRESTOR 20 MG TABS 1 once daily EXCEPT 1/2 Tues [employer]   Method Requested: Fax to Local Pharmacy Initial call taken by: Shonna Chock,  September 07, 2009 4:02 PM  Follow-up for Phone Call        Patient with new insurance and needs a new RX sent to [facility] for [employer]. Patient left additional info: she has tried Pravachol and Lipitor in the past and Crestor is the only thing that works for her and her husband.  Noted Follow-up by: Shonna Chock,  September 07, 2009 4:04 PM    Prescriptions: CRESTOR 20 MG TABS (ROSUVASTATIN CALCIUM) 1 once daily EXCEPT 1/2 Tues , Th  & Sat  #30 x 5   Entered by:   Shonna Chock   Authorized by:   Marga Melnick MD   Signed by:   Shonna Chock on 09/07/2009   Method used:   Electronically to        [facility]* (retail)       [address]       [place], [place]  [address]       Ph: [phone]       Fax: [phone]   RxID:   [id number]

## 2010-05-10 NOTE — Progress Notes (Signed)
Summary: has flu symptoms--too late for Tamiflu??  Phone Note Call from Patient Call back at Home Phone [phone]   Caller: Patient Summary of Call: patient called to report that spouse had flu at beginning of week;  now she is starting symptoms at noon today =achy joints, stomach pain, diahhrea, throwing up, chills,        Is it too late for Tamiflu??    can it be called into [place] [facility] [address]  please call patient at [phone] Initial call taken by: Jerolyn Shin,  March 23, 2010 4:48 PM  Follow-up for Phone Call        Patient notified. Follow-up by: Lucious Groves CMA,  March 23, 2010 5:11 PM    New/Updated Medications: TAMIFLU 75 MG CAPS (OSELTAMIVIR PHOSPHATE) 1 by mouth two times a day for five days Prescriptions: TAMIFLU 75 MG CAPS (OSELTAMIVIR PHOSPHATE) 1 by mouth two times a day for five days  #10 x 0   Entered by:   Lucious Groves CMA   Authorized by:   Marga Melnick MD   Signed by:   Lucious Groves CMA on 03/23/2010   Method used:   Faxed to ...       [facility]* (retail)       [address]       [place], [place]  [id number]       Ph: [phone]       Fax: [phone]   RxID:   [id number]

## 2010-05-10 NOTE — Progress Notes (Signed)
Summary: clearance   Phone Note Outgoing Call   Call placed by: Sherri Rad, RN, BSN,  March 06, 2010 6:40 PM Call placed to: Patient Summary of Call: Call placed to the pt in response to a request for cardiac clearance for cataract surgery for the pt for 03/13/10. Per Dr. Juanda Chance, he would like to do a stress test prior to surgery. I have called and explained this to the pt. She states she would like to walk on the treadmill for her nuclear test, but she is having problems with her ankle. She states that when she has had the chemical test done, both her and at the [facility] clinic, that she instantly gets a severe headache. I explained we will need to review this with Dr. Juanda Chance. I explained I will be out of the office tomorrow, but would ask one of our triage nurses to call Dr. Juanda Chance at the hospital and just review this with him since her surgery is early next week. I explained she should be hearing from one of our triage nurses tomorrow about  her clearance. She verbalizes understanding and is agreeable. Initial call taken by: Sherri Rad, RN, BSN,  March 06, 2010 6:44 PM  Follow-up for Phone Call        Pt. is cleared per Dr. Juanda Chance for her eye surgery without having the planned stress test. I will fax this clearance to Dr. Larina Bras Sypher with [facility] in [place].  Whitney Maeola Sarah RN  March 07, 2010 9:20 AM  Follow-up by: Whitney Maeola Sarah RN,  March 07, 2010 9:20 AM

## 2010-05-10 NOTE — Medication Information (Signed)
Summary: Step Therapy Letter Regarding Crestor/[employer]  Step Therapy Letter Regarding Crestor/[employer]   Imported By: Lanelle Bal 09/12/2009 07:49:12  _____________________________________________________________________  External Attachment:    Type:   Image     Comment:   External Document

## 2010-05-10 NOTE — Letter (Signed)
Summary: Clearance Letter  [facility], Main Office  [address]   [place], [place] [address]   Phone: [phone]  Fax: [phone]    March 07, 2010  Re:     [name] Address:   [name]     [place], [place]  [address] DOB:     20-Feb-1941 MRN:     [id number]   To whom it may concern,  [name] is cleared from a cardiac standpoint to have cataract surgery. If you have any further questions, you may contact the above number.   Sincerely,  Whitney Maeola Sarah RN Dr. Charlies Constable  Appended Document: Clearance Letter Faxed to [facility] per Nanakuli Aaron-RN @ [phone] on 03/07/10. The pt is aware she is ok for surgery per Whitney.

## 2010-05-10 NOTE — Consult Note (Signed)
Summary: [facility]   Imported By: Lanelle Bal 07/24/2009 12:43:55  _____________________________________________________________________  External Attachment:    Type:   Image     Comment:   External Document

## 2010-05-10 NOTE — Progress Notes (Signed)
Summary: Cancelling Myoview  Phone Note Call from Patient Call back at Home Phone [phone]   Caller: Patient Summary of Call: Patient called today to cancel her Myoview sched for 4/07 due to a "migraine". This is the second time she's cancelled with the original appt. being 06/08/09. She stated she would call back to resched. whe better. Just felt you should know. Initial call taken by: Leonia Corona, RT-N,  July 12, 2009 1:48 PM     Appended Document: Winfred Burn The pt has r/s or cancelled her myoview on three occasions- 06/08/09, 06/28/09, and 07/13/09. I will forward to Dr. Juanda Chance.

## 2010-05-10 NOTE — Progress Notes (Signed)
Summary: Request for New RX: Metformin  Phone Note Refill Request Message from:  Fax from Pharmacy [address] [facility] [address] fax [phone]  metformin  Initial call taken by: Barb Merino,  May 08, 2009 12:45 PM  Follow-up for Phone Call        Please check paper fax and make sure right med keyed in, this is not on med list.  Enrique Sack pulled paper faxe and this was requested by the Patient. Follow-up by: Shonna Chock,  May 08, 2009 1:23 PM  Additional Follow-up for Phone Call Additional follow up Details #1::        Spoke with patient: Dr.Cottle sent over a message in the fall recommending Metformin. I reviewed chart and seen the note patient was referring to. I printed and placed on ledge for Dr.Lezlie Ritchey to re-review and rx metformin then rx to be sent to [facility]. Patient informed RX will be avaliable tomorrow [facility], I will handle this before the end of the day Additional Follow-up by: Shonna Chock,  May 08, 2009 2:47 PM    Additional Follow-up for Phone Call Additional follow up Details #2::    done Follow-up by: Marga Melnick MD,  May 08, 2009 6:04 PM  New/Updated Medications: METFORMIN HCL 500 MG XR24H-TAB (METFORMIN HCL) 1 once daily with largest meal Prescriptions: METFORMIN HCL 500 MG XR24H-TAB (METFORMIN HCL) 1 once daily with largest meal  #30 x 2   Entered and Authorized by:   Marga Melnick MD   Signed by:   Marga Melnick MD on 05/08/2009   Method used:   Faxed to ...       [facility]* (retail)       [address]       [place], [place]  [id number]       Ph: [phone]       Fax: [phone]   RxID:   [id number]

## 2010-05-10 NOTE — Progress Notes (Signed)
Summary: echo order    Phone Note Call from Patient Call back at Home Phone [phone]   Caller: Patient Reason for Call: Talk to Nurse Details for Reason: pt would like to have echo prior to appt with bb.  Initial call taken by: Lorne Skeens,  May 24, 2009 1:32 PM  Follow-up for Phone Call        I called and spoke with the pt. She states she would like to have a heart ultrasound. She states she has had some chest conjestion and a little more SOB. She broke her foot back in 11/10 and has been slow to recover from that. I explained to the pt I would have to discuss with Dr. Juanda Chance if this is a test he would want to order. The pt states she is going to [place] on 06/17/09 with her daughter and grandchildren and does not want to have a heart attack while she is there. I made the pt aware that a heart ultrasound mainly assesses the heart valves and chambers vs. blood flow in the arteries, that this would need to be assess with a stress test. I will review with Dr. Juanda Chance and see what he wants to do. The pt is aware that Dr. Juanda Chance is out of the office until 2/23. I also explained to her that some testing is not being covered without an office visit prior and that we would need to check on this depending on what Dr. Juanda Chance would want to order.  She has [employer]. I will call her back next week after reviewing with Dr. Juanda Chance. The pt is agreeable.  Follow-up by: Sherri Rad, RN, BSN,  May 24, 2009 2:36 PM  Additional Follow-up for Phone Call Additional follow up Details #1::        If she can exercize a stess echo would  be good to do and we can probably do before ov.  If she can't exercize, get rest echo. BB Additional Follow-up by: Lenoria Farrier, MD, [facility],  May 27, 2009 9:49 AM    Additional Follow-up for Phone Call Additional follow up Details #2::    I have been out of the office since Dr. Juanda Chance responded to this message. The pt is scheduled for her regular f/u office  visit today. We will discuss with the pt at her visit.  Follow-up by: Sherri Rad, RN, BSN,  May 31, 2009 8:42 AM

## 2010-05-10 NOTE — Progress Notes (Signed)
Summary:  dizzy spell this am    Phone Note Call from Patient   Caller: Patient Reason for Call: Talk to Nurse Summary of Call: pt had dizzy spell and felt weak about 8a this morning, feels fine now other than some discomfort in her diaphram and stomach, [place]-wants to know if she should come over for a ekg? pls advise [phone] Initial call taken by: Glynda Jaeger,  November 09, 2009 10:33 AM  Follow-up for Phone Call        [facility]. Sherri Rad, RN, BSN  November 09, 2009 12:27 PM   I called the pt to f/u on her symptoms from thursday. She did not have any more problems over the weekend. She does not check her blood sugar at home. She does not think she had eaten that morning. She had taken a claritin and a lorazepam, which she is cutting back on. I advised the pt. to keep [place] updated and let us know should her symptoms reoccur or occur more frequently.  She is agreeable. Follow-up by: Sherri Rad, RN, BSN,  November 13, 2009 5:25 PM

## 2010-05-10 NOTE — Assessment & Plan Note (Signed)
Summary: FEVER,SORE THROAT,COUGH,//FD   Vital Signs:  Patient profile:   70 year old female Height:      61 inches Weight:      169 pounds Temp:     99.1 degrees F oral Pulse rate:   87 / minute Resp:     17 per minute BP sitting:   140 / 72  (left arm)  Vitals Entered By: Jeremy Johann CMA (April 14, 2009 2:59 PM) CC: fever,cough, drainage, little soreness in throat Comments REVIEWED MED LIST, PATIENT AGREED DOSE AND INSTRUCTION CORRECT    Primary Care Provider:  Marga Melnick MD  CC:  fever, cough, drainage, and little soreness in throat.  History of Present Illness: Onset 04/10/2009 as frontal headache w/o facial pain ;temp to 100.1 & purulence as of 01/05. Rx: Neti pot, Nasonex, Afrin. No PMH of asthma  Allergies: 1)  ! * Emycin 2)  ! * [cm 3)  ! * Pain Meds 4)  ! Zithromax 5)  ! * Dynabac 6)  ! Thorazine 7)  ! Lidocaine 8)  ! Keflex 9)  ! Levsin 10)  ! * Adderall 11)  ! * Ketek 12)  ! * Topamax 13)  ! Flagyl 14)  ! Cipro 15)  ! Penicillin 16)  ! Lipitor 17)  ! Doxycycline  Review of Systems General:  Complains of chills, fever, and sweats. ENT:  Denies ear discharge and earache. Resp:  Denies chest pain with inspiration, coughing up blood, shortness of breath, and wheezing.  Physical Exam  General:  Appears tired but well-nourished,in no acute distress; alert,appropriate and cooperative throughout examination Ears:  External ear exam shows no significant lesions or deformities.  Otoscopic examination reveals clear canals, tympanic membranes are intact bilaterally without bulging, retraction, inflammation or discharge. Hearing is grossly normal bilaterally. Nose:  External nasal examination shows no deformity or inflammation. Nasal mucosa are dry  without lesions or exudates. Hyponasal speech Mouth:  Oral mucosa and oropharynx without lesions or exudates.  Teeth in good repair. Lungs:  Normal respiratory effort, chest expands symmetrically. Lungs are  clear to auscultation, no crackles or wheezes but dry cough. Cervical Nodes:  No lymphadenopathy noted Axillary Nodes:  No palpable lymphadenopathy   Impression & Recommendations:  Problem # 1:  BRONCHITIS-ACUTE (ICD-466.0)  Her updated medication list for this problem includes:    Smz-tmp Ds 800-160 Mg Tabs (Sulfamethoxazole-trimethoprim) .Marland Kitchen... 1 two times a day    Singulair 10 Mg Tabs (Montelukast sodium) .Marland Kitchen... 1 once daily    Smz-tmp Ds 800-160 Mg Tabs (Sulfamethoxazole-trimethoprim) .Marland Kitchen... 1 two times a day with 8 oz water  Problem # 2:  URI (ICD-465.9)  Complete Medication List: 1)  Lorazepam 2 Mg Tabs (Lorazepam) .Marland Kitchen.. 1 po am, 1 1/2 @ night: prescribed by dr cottle only 2)  Zetia 10 Mg Tabs (Ezetimibe) .Marland Kitchen.. 1 by mouth qd 3)  Nexium 40 Mg Pack (Esomeprazole magnesium) .... Take one tablet once a day 4)  Otc Niacin  .Marland Kitchen.. 1 tab daily 5)  Coq10 100mg   .... 1 by mouth two times a day 6)  Vit B  .... 1 by mouth two times a day 7)  Vit D 1,000mg   .... 2 qd 8)  Omega Three 1,ooomg  .... Three times a day 9)  Gabapentin 100 Mg Caps (Gabapentin) .... As needed pain can take up to 3 tablets daily 10)  Zyprexa 2.5 Mg Tabs (Olanzapine) .... Take 1 tablet by mouth once a day 11)  Tigan 300 Mg Caps (  Trimethobenzamide hcl) .... As needed nausea 12)  Melatonin 1mg   .... As needed 13)  Benadryl  .... Prn 14)  Bupropion 300mg   .... 1 tab qd 15)  Calcium 500mg   .... 1 daily 16)  Depakote 125mg   .... 1 tab bid 17)  Pepcid Ac  .... Prn 18)  Bactroban 2 % Crea (Mupirocin calcium) .... Apply once daily after cleansing 19)  Smz-tmp Ds 800-160 Mg Tabs (Sulfamethoxazole-trimethoprim) .Marland Kitchen.. 1 two times a day 20)  Singulair 10 Mg Tabs (Montelukast sodium) .Marland Kitchen.. 1 once daily 21)  Nasonex 50 Mcg/act Susp (Mometasone furoate) .Marland Kitchen.. 1 spray two times a day 22)  Crestor 20 Mg Tabs (Rosuvastatin calcium) .Marland Kitchen.. 1 once daily 23)  Anusol-hc 25 Mg Supp (Hydrocortisone acetate) .... Anusol one hs 24)  Smz-tmp Ds  800-160 Mg Tabs (Sulfamethoxazole-trimethoprim) .Marland Kitchen.. 1 two times a day with 8 oz water  Patient Instructions: 1)  Neti pot once daily - two times a day as needed followed by Nasonex. 2)  Drink as much fluid as you can tolerate for the next few days. Prescriptions: SMZ-TMP DS 800-160 MG TABS (SULFAMETHOXAZOLE-TRIMETHOPRIM) 1 two times a day with 8 oz water  #20 x 0   Entered and Authorized by:   Marga Melnick MD   Signed by:   Marga Melnick MD on 04/14/2009   Method used:   Faxed to ...       [facility]* (retail)       [address]       [place], [place]  [id number]       Ph: [phone]       Fax: [phone]   RxID:   [id number]

## 2010-05-10 NOTE — Progress Notes (Signed)
Summary: ENT REFERRAL  Phone Note Outgoing Call   Call placed by: Magdalen Spatz [facility],  February 07, 2010 9:40 AM Call placed to: Patient Summary of Call: IN REFERENCE TO ENT REFERRAL......... [facility] GAVE PATIENT APPT FOR 02-12-2010 W/DR ROSEN, I CALLED S/W PT WHO STATED SHE CXLED ABOVE APPT & WENT TO AN ALLERGIST, SHE WILL HAVE THOSE NOTES FORWARDED TO DR. HOPPER & STATES ALL HER PROBLEMS ARE ALREADY CLEARING UP.   Initial call taken by: Magdalen Spatz [facility],  February 07, 2010 9:41 AM  Follow-up for Phone Call        cancell ENT appt please Follow-up by: Marga Melnick MD,  February 07, 2010 12:51 PM  Additional Follow-up for Phone Call Additional follow up Details #1::        ALREADY CANCELLED BY PT. Magdalen Spatz [facility]  February 07, 2010 1:19 PM

## 2010-05-10 NOTE — Assessment & Plan Note (Signed)
Summary: abdominal pain, diarrhea/alr   Vital Signs:  Patient profile:   70 year old female Weight:      159.2 pounds Temp:     98.7 degrees F oral Pulse rate:   76 / minute Resp:     15 per minute BP sitting:   110 / 80  (left arm) Cuff size:   large  Vitals Entered By: Shonna Chock (September 15, 2009 12:24 PM) CC: Abdominal pain and diarrhea since Monday evening. Patient was also very bloated "Felt like I needed to pass gas but I couldnt", after diarrhea episode  on Monday no BM until today Comments REVIEWED MED LIST, PATIENT AGREED DOSE AND INSTRUCTION CORRECT    Primary Care Provider:  Marga Melnick MD  CC:  Abdominal pain and diarrhea since Monday evening. Patient was also very bloated "Felt like I needed to pass gas but I couldnt" and after diarrhea episode  on Monday no BM until today.  History of Present Illness:  Abdominal Pain      This is a 70 year old woman who presents with Abdominal pain for 5 days butit has  improved  as diarrhea has resolved & "gas started moving" .  The patient reports diarrhea until 06/07 and anorexia, but denies nausea, vomiting, constipation, melena, hematochezia, and hematemesis.  The location of the pain is in L periumbilical area but she also  had RUQ & LLQ discomfort.  The pain is   described as intermittent and dull now , but with the diarrhea it was sharp & constant.  Associated symptoms include weight loss of 5 #.  The patient denies the following symptoms: fever, dysuria, chest pain, jaundice, dark urine, and vaginal bleeding.  The pain seemingly improved  with antacids and defecation. Three soft BMs this am.On  low residue diet this week. 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 Keflex 4 weeks ago for Rosacea.  Allergies: 1)  ! * Emycin 2)  ! * [cm 3)  ! * Pain Meds 4)  ! Zithromax 5)  ! * Dynabac 6)  ! Thorazine 7)  ! Lidocaine 8)  ! Keflex 9)  ! Levsin 10)  ! * Adderall 11)  ! * Ketek 12)  ! * Topamax 13)  !  Flagyl 14)  ! Cipro 15)  ! Penicillin 16)  ! Lipitor 17)  ! Doxycycline 18)  ! Zetia (Ezetimibe)  Physical Exam  General:  well-nourished,in no acute distress; alert,appropriate and cooperative throughout examination Eyes:  No corneal or conjunctival inflammation noted. No icterus Mouth:  Oral mucosa and oropharynx without lesions or exudates.  Tongue coated but moist Lungs:  Normal respiratory effort, chest expands symmetrically. Lungs are clear to auscultation, no crackles or wheezes. Heart:  Normal rate and regular rhythm. S1 and S2 normal without gallop, murmur, click, rub . Abdomen:  Bowel sounds positive,abdomen soft  but minimally tender without masses, organomegaly or hernias noted. Skin:  Intact without suspicious lesions or rashes. No jaundice. Slight tenting Cervical Nodes:  No lymphadenopathy noted Axillary Nodes:  No palpable lymphadenopathy Psych:  memory intact for recent and remote, normally interactive, and good eye contact.     Impression & Recommendations:  Problem # 1:  ABDOMINAL PAIN, PERIUMBILICAL (ICD-789.05)  also diffuse  Orders: Venipuncture (29562) TLB-CBC Platelet - w/Differential (85025-CBCD)  Problem # 2:  DIARRHEA (ICD-787.91)  resolving  Orders: Venipuncture (13086) TLB-CBC Platelet - w/Differential (85025-CBCD) TLB-Creatinine, Blood (82565-CREA) TLB-Potassium (K+) (84132-K) TLB-BUN (Urea Nitrogen) (84520-BUN)  Complete Medication  List: 1)  Lorazepam 2 Mg Tabs (Lorazepam) .Marland Kitchen.. 1 po am, 1 1/2 @ night: prescribed by dr cottle only 2)  Nexium 40 Mg Pack (Esomeprazole magnesium) .... Take one tablet once a day 3)  Coq10 100mg   .... 1 by mouth two times a day 4)  Vit B  .... 1 by mouth two times a day 5)  Fish Oil 1000 Mg Caps (Omega-3 fatty acids) .Marland Kitchen.. 1 by mouth once daily 6)  Zyprexa 2.5 Mg Tabs (Olanzapine) .... Take 1 tablet by mouth once a day 7)  Tigan 300 Mg Caps (Trimethobenzamide hcl) .... As needed nausea 8)  Melatonin 1mg   ....  As needed 9)  Benadryl  .... Prn 10)  Bupropion 300mg   .... 1 tab qd 11)  Calcium 500mg   .... 1 daily 12)  Depakote 125mg   .... 1 tab bid 13)  Pepcid Ac  .... Prn 14)  Singulair 10 Mg Tabs (Montelukast sodium) .Marland Kitchen.. 1 once daily 15)  Crestor 20 Mg Tabs (Rosuvastatin calcium) .Marland Kitchen.. 1 once daily except 1/2 tues , th  & sat 16)  Metformin Hcl 500 Mg Xr24h-tab (Metformin hcl) .Marland Kitchen.. 1 once daily with largest meal 17)  Diclofenac Sodium Cr 100 Mg Xr24h-tab (Diclofenac sodium) .Marland Kitchen.. 1 by mouth once daily  Patient Instructions: 1)  Drink clear liquids  and food as you  tolerate them as discussed. Align once daily until bowels are normal.

## 2010-05-10 NOTE — Consult Note (Signed)
Summary: [facility]  [facility]   Imported By: Lanelle Bal 06/23/2009 12:21:52  _____________________________________________________________________  External Attachment:    Type:   Image     Comment:   External Document

## 2010-05-10 NOTE — Progress Notes (Signed)
Summary: recurring issue  Phone Note Call from Patient Call back at Home Phone [phone]   Summary of Call: Patient left message on triage stating that she has been on ABX for one month and just finished them yesterday. Today she notice yellow mucous from the left nostril. Patient would like to know what to do for this recurring issue and if she should start Prednisone. Please advise.  Patient would also like to kow if she should continue Nasonex or begine the Fluticasone? Initial call taken by: Lucious Groves CMA,  January 26, 2010 11:21 AM  Follow-up for Phone Call        use Neti pot two times a day followed by either Flonase OR Nasonex. ENT consult needed ; does she have preference Follow-up by: Marga Melnick MD,  January 26, 2010 1:12 PM  Additional Follow-up for Phone Call Additional follow up Details #1::        REFERRAL IN PROCESS, WILL NOTIFY PATIENT OF APPT WHEN FAX REC'D. Additional Follow-up by: Magdalen Spatz [facility],  January 26, 2010 2:34 PM  New Problems: SINUSITIS, MAXILLARY (ICD-473.0)   New Problems: SINUSITIS, MAXILLARY (ICD-473.0)  Appended Document: recurring issue Patient notified and will call [facility] for appt.

## 2010-05-10 NOTE — Letter (Signed)
Summary: Generic Letter  Architectural technologist, Main Office  [address]   [place], [place] [address]   Phone: [phone]  Fax: [phone]        January 03, 2010 MRN: [id number]    [name] [address] [place], [place]  [address]    Dr. Charlann Boxer,   Ms. Espino is at acceptable cardiovascular risk to undergo excision of her right knee superficial mass under local with MAC anesthesia. If you have any further questions, please contact our office.         Sincerely,  Charlies Constable, MD Sherri Rad, RN, BSN  This letter has been electronically signed by your physician.  Appended Document: Generic Letter Faxed to [facility] at Dr. Nilsa Nutting office @ [phone].

## 2010-05-10 NOTE — Progress Notes (Signed)
Summary: want to talk to Dr.Brodie regarding surgery (talk w/ BB)   Phone Note Call from Patient Call back at Home Phone [phone]   Caller: Patient Summary of Call: Pt having surgery  and want to talk to Dr.Brodie (Knee surgery) Initial call taken by: Judie Grieve,  December 27, 2009 1:17 PM  Follow-up for Phone Call        I attempted to call the pt. Line busy. I will retry. Sherri Rad, RN, BSN  December 27, 2009 2:14 PM  [facility].  Follow-up by: Sherri Rad, RN, BSN,  December 27, 2009 6:22 PM  Additional Follow-up for Phone Call Additional follow up Details #1::        pt rtn your call  I spoke with pt. She has something on her knee cap that is moving around and getting bigger. Dr. Charlann Boxer is going to remove it with a "simple" procedure per the pt. She states she will be under light sedation (she cannot have any sedation with preservatives- her dentist uses carbacaine plain). Dr. Charlann Boxer needs clearance from our office for this. I explained to the pt I will need to contact Dr. Nilsa Nutting office about the details and review with Dr. Juanda Chance. I will let her know on 9/27 if she is cleared per Dr. Juanda Chance. She is agreeable. Additional Follow-up by: Sherri Rad, RN, BSN,  December 28, 2009 4:05 PM    Additional Follow-up for Phone Call Additional follow up Details #2::    I left a message for the surgery scheduler at Dr. Nilsa Nutting office to call. Sherri Rad, RN, BSN  December 28, 2009 4:28 PM   I spoke with Cordelia Pen at Dr. Nilsa Nutting office. The pt will be having an excision of a right knee superficial mass. This will be done with choice anesthesia for about 60 minutes. This should be a local w/ MAC anesthesia- basically a "twilight." I explained ot Cordelia Pen I will review with Dr. Juanda Chance and let them know something next week about her clearance. Sherry's fax # is [phone]. Sherri Rad, RN, BSN  December 28, 2009 4:46 PM    Appended Document: want to talk to Dr.Brodie regarding  surgery (talk w/ BB) Per Dr. Juanda Chance, pt ok for surgery. Letter of clearance faxed to [facility] @ Dr. Nilsa Nutting office [phone]). I have left a message for the pt. to call.

## 2010-05-10 NOTE — Progress Notes (Signed)
Summary: Refill Request  Phone Note Refill Request Message from:  Pharmacy on [facility] Fax #: [phone]  Refills Requested: Medication #1:  CRESTOR 20 MG TABS 1 once daily EXCEPT 1/2 Tues   Dosage confirmed as above?Dosage Confirmed Mrs. Fusco believes there has been a change in directions . Please verfy and update rx. Thanks!  Initial call taken by: Harold Barban,  May 23, 2009 11:50 AM    Prescriptions: CRESTOR 20 MG TABS (ROSUVASTATIN CALCIUM) 1 once daily EXCEPT 1/2 Tues , Th  & Sat  #30 x 2   Entered by:   Shonna Chock   Authorized by:   Marga Melnick MD   Signed by:   Shonna Chock on 05/23/2009   Method used:   Electronically to        [facility]* (retail)       [address]       [place], [place]  [id number]       Ph: [phone]       Fax: [phone]   RxID:   [id number]

## 2010-05-10 NOTE — Assessment & Plan Note (Signed)
Summary: DISCUSS LABS= LIPIDS///SPH   Vital Signs:  Patient profile:   70 year old female Weight:      166.8 pounds Pulse rate:   72 / minute Resp:     15 per minute BP sitting:   120 / 70  (left arm) Cuff size:   large  Vitals Entered By: Shonna Chock (May 19, 2009 10:34 AM) CC: Follow-up visit: discuss labs  Comments REVIEWED MED LIST, PATIENT AGREED DOSE AND INSTRUCTION CORRECT    Primary Care Provider:  Marga Melnick MD  CC:  Follow-up visit: discuss labs .  History of Present Illness: Labs reviewed & risks discussed; lipids are phenomenally good on Crestor 20 mg daily. A1c is non Diabetic. Chair execises 1X/week. Diet: < 25  grams  of sugar / day from High Fructose Corn Syrup.  Allergies: 1)  ! * Emycin 2)  ! * [cm 3)  ! * Pain Meds 4)  ! Zithromax 5)  ! * Dynabac 6)  ! Thorazine 7)  ! Lidocaine 8)  ! Keflex 9)  ! Levsin 10)  ! * Adderall 11)  ! * Ketek 12)  ! * Topamax 13)  ! Flagyl 14)  ! Cipro 15)  ! Penicillin 16)  ! Lipitor 17)  ! Doxycycline 18)  ! 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 2. Hyperlipidemia with low HDL. 3. INTOLERANCE TO ASPIRIN AND PLAVIX. 4. Irritable bowel syndrome. 5. Gastroesophageal reflux disease. 6. Fibromyalgia. GERD FIBROMYALGIA Irritable Bowel Syndrome  Review of Systems CV:  Denies chest pain or discomfort, leg cramps  with exertion, palpitations, and shortness of breath with exertion. GI:  Complains of gas; Bloating present" all my life"; Gyn exam neg 2 months ago.  Physical Exam  General:  well-nourished;alert,appropriate and cooperative throughout examination Heart:  Normal rate and regular rhythm. S1 and S2 normal without gallop, murmur, click, rub. S4 with slurring Pulses:  R and L carotid,radial,dorsalis pedis and posterior tibial pulses are full and equal bilaterally Extremities:  No clubbing, cyanosis, edema. Psych:  memory intact for recent and remote, normally interactive, and good eye contact.  Focused & motivated   Impression & Recommendations:  Problem # 1:  HYPERLIPIDEMIA-MIXED (ICD-272.4)  The following medications were removed from the medication list:    Zetia 10 Mg Tabs (Ezetimibe) .Marland Kitchen... 1 by mouth qd Her updated medication list for this problem includes:    Crestor 20 Mg Tabs (Rosuvastatin calcium) .Marland Kitchen... 1 once daily except 1/2 tues , th  & sat  Problem # 2:  HYPERGLYCEMIA, FASTING (ICD-790.29) A1c NON Diabetic The following medications were removed from the medication list:    Metformin Hcl 500 Mg Tabs (Metformin hcl) .Marland Kitchen... 1 by mouth once daily Her updated medication list for this problem includes:    Metformin Hcl 500 Mg Xr24h-tab (Metformin hcl) .Marland Kitchen... 1 once daily with largest meal  Problem # 3:  CAD (ICD-414.00)  Complete Medication List: 1)  Lorazepam 2 Mg Tabs (Lorazepam) .Marland Kitchen.. 1 po am, 1 1/2 @ night: prescribed by dr cottle only 2)  Nexium  40 Mg Pack (Esomeprazole magnesium) .... Take one tablet once a day 3)  Otc Niacin  .Marland Kitchen.. 1 tab daily 4)  Coq10 100mg   .... 1 by mouth two times a day 5)  Vit B  .... 1 by mouth two times a day 6)  Vit D 1,000mg   .... 2 qd 7)  Fish Oil 1000 Mg Caps (Omega-3 fatty acids) .Marland Kitchen.. 1 by mouth once daily 8)  Gabapentin 100 Mg Caps (Gabapentin) .... As needed pain can take up to 3 tablets daily 9)  Zyprexa 2.5 Mg Tabs (Olanzapine) .... Take  1 tablet by mouth once a day 10)  Tigan 300 Mg Caps (Trimethobenzamide hcl) .... As needed nausea 11)  Melatonin 1mg   .... As needed 12)  Benadryl  .... Prn 13)  Bupropion 300mg   .... 1 tab qd 14)  Calcium 500mg   .... 1 daily 15)  Depakote 125mg   .... 1 tab bid 16)  Pepcid Ac  .... Prn 17)  Bactroban 2 % Crea (Mupirocin calcium) .... Apply once daily after cleansing 18)  Singulair 10 Mg Tabs (Montelukast sodium) .Marland Kitchen.. 1 once daily 19)  Nasonex 50 Mcg/act Susp (Mometasone furoate) .Marland Kitchen.. 1 spray two times a day 20)  Crestor 20 Mg Tabs (Rosuvastatin calcium) .Marland Kitchen.. 1 once daily except 1/2 tues , th  & sat 21)  Anusol-hc 25 Mg Supp (Hydrocortisone acetate) .... Anusol one hs 22)  Metformin Hcl 500 Mg Xr24h-tab (Metformin hcl) .Marland Kitchen.. 1 once daily with largest meal  Patient Instructions: 1)  Please schedule a follow-up appointment in 4 months. 2)  Lipid Panel prior to visit, ICD-9:272.4 3)  HbgA1C prior to visit, ICD-9:790.29. Continue < 25 grams of sugar/day from HFCS

## 2010-05-10 NOTE — Assessment & Plan Note (Signed)
Summary: FOR SORE THROAT AND COUGH//PH   Vital Signs:  Patient profile:   70 year old female Weight:      158 pounds BMI:     29.96 Temp:     98.6 degrees F oral Pulse rate:   88 / minute Resp:     17 per minute BP sitting:   122 / 78  (left arm) Cuff size:   large  Vitals Entered By: Shonna Chock CMA (January 16, 2010 10:36 AM) CC: Ongoing concerns from last OV: Patient c/o cough and sore throat dur to drainage, URI symptoms Comments Taking Robitussin and Mucinex for current symptoms   Primary Care Provider:  Marga Melnick MD  CC:  Ongoing concerns from last OV: Patient c/o cough and sore throat dur to drainage and URI symptoms.  History of Present Illness: RTI Symptoms      This is a 70 year old woman who presents with RTI symptoms unchanged over 5  months.  The patient reports nasal congestion, purulent nasal discharge, sore throat, and productive cough with tan  material, but denies earache.  Associated symptoms include  some wheezing.  The patient denies fever and dyspnea.  The patient denies headache.  Risk factors for Strep sinusitis include bilateral facial pain.  The patient denies the following risk factors for Strep sinusitis: tooth pain and tender adenopathy.  Rx: Neti pot, gargles, Robitussin, Mucinex. S/P [facility] TMP X 10 days 09/20-30.Cataract surgery being defrred until RTI issues resolved.Despite history of  PCN allergy ; she states she has taken it recently w/o adverse reaction. She is unable to provide precise nature of multiple drug "allergies" .  Current Medications (verified): 1)  Lorazepam 2 Mg  Tabs (Lorazepam) .Marland Kitchen.. 1 Po Am, 1 1/2 @ Night: Prescribed By Dr Jennelle Human Only 2)  Nexium 40 Mg  Pack (Esomeprazole Magnesium) .... Take One Tablet Once A Day 3)  Coq10 100mg  .... 1 By Mouth Two Times A Day 4)  Vit B .... 1 By Mouth Two Times A Day 5)  Fish Oil 1000 Mg Caps (Omega-3 Fatty Acids) .Marland Kitchen.. 1 By Mouth Once Daily 6)  Zyprexa 2.5 Mg  Tabs (Olanzapine) .... Take 1  Tablet By Mouth Once A Day Except 1/2 /mont/th/sat 7)  Tigan 300 Mg  Caps (Trimethobenzamide Hcl) .... As Needed Nausea 8)  Melatonin 1mg  .... As Needed 9)  Benadryl .... Prn 10)  Bupropion 300mg  .... 1 Tab Qd 11)  Calcium 500mg  .... 1 Daily 12)  Depakote 125mg  .... 1 Tab Bid 13)  Pepcid Ac .... Prn 14)  Crestor 20 Mg Tabs (Rosuvastatin Calcium) .Marland Kitchen.. 1 Once Daily Except 1/2 Tues , Th  & Sat 15)  Metformin Hcl 500 Mg Xr24h-Tab (Metformin Hcl) .Marland Kitchen.. 1 Once Daily With Largest Meal **labs Due** 16)  Diclofenac Sodium Cr 100 Mg Xr24h-Tab (Diclofenac Sodium) .Marland Kitchen.. 1 By Mouth Once Daily As Needed 17)  Zetia 10 Mg Tabs (Ezetimibe) .Marland Kitchen.. 1 By Mouth At Bedtime 18)  Tylenol Extra Strength 500 Mg Tabs (Acetaminophen) .... As Needed (Almost Daily) 19)  Excedrin Migraine 250-250-65 Mg Tabs (Aspirin-Acetaminophen-Caffeine) .... As Needed  Allergies: 1)  ! * Emycin 2)  ! * [cm 3)  ! * Pain Meds 4)  ! Zithromax 5)  ! * Dynabac 6)  ! Thorazine 7)  ! Lidocaine 8)  ! Keflex 9)  ! Levsin 10)  ! * Adderall 11)  ! * Ketek 12)  ! * Topamax 13)  ! Flagyl 14)  ! Cipro 15)  !  Penicillin 16)  ! Lipitor 17)  ! Doxycycline 18)  ! Zetia (Ezetimibe) 19)  ! * Sulfamethoxazole  Review of Systems MS:  Vit D deficiency F/U requested. Diagnosis made by Dr Rosalio Macadamia.  Physical Exam  General:  in no acute distress; alert,appropriate and cooperative throughout examination Eyes:  No corneal or conjunctival inflammation noted. Ears:  External ear exam shows no significant lesions or deformities.  Otoscopic examination reveals clear canals, tympanic membranes are intact bilaterally without bulging, retraction, inflammation or discharge. Hearing is grossly normal bilaterally. Nose:  External nasal examination shows no deformity or inflammation. Nasal mucosa are pink and moist without lesions or exudates. Mouth:  Oral mucosa and oropharynx without lesions or exudates.  Teeth in good repair. Lungs:  Normal respiratory  effort, chest expands symmetrically. Lungs are clear to auscultation, no crackles or wheezes. Skin:  Facial Rosacea Cervical Nodes:  No lymphadenopathy noted Axillary Nodes:  No palpable lymphadenopathy   Impression & Recommendations:  Problem # 1:  BRONCHITIS-ACUTE (ICD-466.0) Actually a protracted bronchitis by history The following medications were removed from the medication list:    Singulair 10 Mg Tabs (Montelukast sodium) .Marland Kitchen... 1 once daily    Smz-tmp Ds 800-160 Mg Tabs (Sulfamethoxazole-trimethoprim) .Marland Kitchen... 1 two times a day with 8 oz of water Her updated medication list for this problem includes:    Clarithromycin 250 Mg Tabs (Clarithromycin) .Marland Kitchen... 2 once daily with a meal if [place] can define no true allergic reaction to emycin  Orders: Venipuncture (04540) TLB-CBC Platelet - w/Differential (85025-CBCD) T-2 View CXR (71020TC) Specimen Handling (98119)  Problem # 2:  URI (ICD-465.9)  Her updated medication list for this problem includes:    Diclofenac Sodium Cr 100 Mg Xr24h-tab (Diclofenac sodium) .Marland Kitchen... 1 by mouth once daily as needed    Tylenol Extra Strength 500 Mg Tabs (Acetaminophen) .Marland Kitchen... As needed (almost daily)  Orders: Venipuncture (14782) TLB-CBC Platelet - w/Differential (85025-CBCD) T-Sinuses Complete (70220TC) Specimen Handling (95621)  Problem # 3:  UNSPECIFIED VITAMIN D DEFICIENCY (ICD-268.9)  Verified by  Dr Rosalio Macadamia 5 months ago;recheck requested. She is unsure of her  present vit D dose ?  Orders: T-Vitamin D (25-Hydroxy) [phone]) Specimen Handling (62952)  Complete Medication List: 1)  Lorazepam 2 Mg Tabs (Lorazepam) .Marland Kitchen.. 1 po am, 1 1/2 @ night: prescribed by dr cottle only 2)  Nexium 40 Mg Pack (Esomeprazole magnesium) .... Take one tablet once a day 3)  Coq10 100mg   .... 1 by mouth two times a day 4)  Vit B  .... 1 by mouth two times a day 5)  Fish Oil 1000 Mg Caps (Omega-3 fatty acids) .Marland Kitchen.. 1 by mouth once daily 6)  Zyprexa 2.5 Mg  Tabs (Olanzapine) .... Take 1 tablet by mouth once a day except 1/2 /mont/th/sat 7)  Tigan 300 Mg Caps (Trimethobenzamide hcl) .... As needed nausea 8)  Melatonin 1mg   .... As needed 9)  Benadryl  .... Prn 10)  Bupropion 300mg   .... 1 tab qd 11)  Calcium 500mg   .... 1 daily 12)  Depakote 125mg   .... 1 tab bid 13)  Pepcid Ac  .... Prn 14)  Crestor 20 Mg Tabs (Rosuvastatin calcium) .Marland Kitchen.. 1 once daily except 1/2 tues , th  & sat 15)  Metformin Hcl 500 Mg Xr24h-tab (Metformin hcl) .Marland Kitchen.. 1 once daily with largest meal **labs due** 16)  Diclofenac Sodium Cr 100 Mg Xr24h-tab (Diclofenac sodium) .Marland Kitchen.. 1 by mouth once daily as needed 17)  Zetia 10 Mg Tabs (Ezetimibe) .Marland KitchenMarland KitchenMarland Kitchen 1  by mouth at bedtime 18)  Tylenol Extra Strength 500 Mg Tabs (Acetaminophen) .... As needed (almost daily) 19)  Excedrin Migraine 250-250-65 Mg Tabs (Aspirin-acetaminophen-caffeine) .... As needed 20)  Clarithromycin 250 Mg Tabs (Clarithromycin) .... 2 once daily with a meal if [place] can define no true allergic reaction to emycin  Other Orders: Rapid Strep (95621)  Patient Instructions: 1)  A print out is  critical of  any verified( rash, fever) drug allergies.Please discuss with your Pharmacist. Prescriptions: CLARITHROMYCIN 250 MG TABS (CLARITHROMYCIN) 2 once daily with a meal if [facility] can define no true allergic reaction to [employer]  #20 x 0   Entered by:   Lucious Groves CMA   Authorized by:   Marga Melnick MD   Signed by:   Lucious Groves CMA on 01/16/2010   Method used:   Faxed to ...       [facility]* (retail)       [address]       [place], [place]  [address]       Ph: [phone]       Fax: [phone]   RxID:   [id number] CLARITHROMYCIN 250 MG TABS (CLARITHROMYCIN) 2 once daily with a meal if [facility] can define no true allergic reaction to Emycin  #20 x 0   Entered and Authorized by:   Marga Melnick MD   Signed by:   Marga Melnick MD on 01/16/2010   Method used:   Print then Give to  Patient   RxID:   [id number]   Laboratory Results    Other Tests  Rapid Strep: negative  [facility] pharmacy called the office stating that the patient needs the above prescription delivered, however she cannot get there to give them the prescription she was given in office. Faxed directly to pharmacy. Lucious Groves CMA  January 16, 2010 2:28 PM

## 2010-05-10 NOTE — Assessment & Plan Note (Signed)
Summary: knot on knee/spot on calf/cbs   Vital Signs:  Patient profile:   70 year old female Weight:      162.2 pounds BMI:     30.76 Temp:     98.4 degrees F oral Pulse rate:   72 / minute Resp:     16 per minute BP sitting:   104 / 68  (left arm) Cuff size:   large  Vitals Entered By: Shonna Chock (Aug 14, 2009 10:26 AM) CC: Knot on knee  x several weeks and spot on calf x several months Comments REVIEWED MED LIST, PATIENT AGREED DOSE AND INSTRUCTION CORRECT    Primary Care Provider:  Marga Melnick MD  CC:  Knot on knee  x several weeks and spot on calf x several months.  History of Present Illness: She has had a  painless , unchanging " spot" on the RLE for several months  w/o trigger, bite or injury. Rx: none. Also she has had a knot @ R patellar X 2-3 weeks. She had injured that knee 11/10/2009 in a fall on [place]  wet [address] [place]. This was was complicated by infection responsive to oral & topical antibiotics.  Allergies: 1)  ! * Emycin 2)  ! * [cm 3)  ! * Pain Meds 4)  ! Zithromax 5)  ! * Dynabac 6)  ! Thorazine 7)  ! Lidocaine 8)  ! Keflex 9)  ! Levsin 10)  ! * Adderall 11)  ! * Ketek 12)  ! * Topamax 13)  ! Flagyl 14)  ! Cipro 15)  ! Penicillin 16)  ! Lipitor 17)  ! Doxycycline 18)  ! Zetia (Ezetimibe)  Review of Systems General:  Denies chills, fever, and sweats; Weight loss on CVE & improved nutrition. MS:  Complains of joint pain; R hip bursa injections ? X 6 by Dr Dareen Piano. Derm:  Complains of changes in color of skin and lesion(s); denies itching and poor wound healing.  Physical Exam  General:  well-nourished; alert,appropriate and cooperative throughout examination Extremities:  No clubbing, cyanosis, edema, or deformity noted with normal full range of motion of all joints.   No patellar effusion.  Skin:  1X 1 cm tannish flat lesion R lateral  calf. B B sized freely moveable   transilluminating phlebolith R patellar  area   Impression & Recommendations:  Problem # 1:  OTHER SPECIFIED CIRCULATORY SYSTEM DISORDERS (ICD-459.89) Phlebolith   Problem # 2:  SKIN LESION, BENIGN (ICD-709.9)  Complete Medication List: 1)  Lorazepam 2 Mg Tabs (Lorazepam) .Marland Kitchen.. 1 po am, 1 1/2 @ night: prescribed by dr cottle only 2)  Nexium 40 Mg Pack (Esomeprazole magnesium) .... Take one tablet once a day 3)  Coq10 100mg   .... 1 by mouth two times a day 4)  Vit B  .... 1 by mouth two times a day 5)  Fish Oil 1000 Mg Caps (Omega-3 fatty acids) .Marland Kitchen.. 1 by mouth once daily 6)  Zyprexa 2.5 Mg Tabs (Olanzapine) .... Take 1 tablet by mouth once a day 7)  Tigan 300 Mg Caps (Trimethobenzamide hcl) .... As needed nausea 8)  Melatonin 1mg   .... As needed 9)  Benadryl  .... Prn 10)  Bupropion 300mg   .... 1 tab qd 11)  Calcium 500mg   .... 1 daily 12)  Depakote 125mg   .... 1 tab bid 13)  Pepcid Ac  .... Prn 14)  Singulair 10 Mg Tabs (Montelukast sodium) .Marland Kitchen.. 1 once daily 15)  Crestor 20  Mg Tabs (Rosuvastatin calcium) .Marland Kitchen.. 1 once daily except 1/2 tues , th  & sat 16)  Metformin Hcl 500 Mg Xr24h-tab (Metformin hcl) .Marland Kitchen.. 1 once daily with largest meal 17)  Diclofenac Sodium Cr 100 Mg Xr24h-tab (Diclofenac sodium) .Marland Kitchen.. 1 by mouth once daily  Patient Instructions: 1)  Go to Web MD for PHLEBOLITH.  Appended Document: knot on knee/spot on calf/cbs Patient mentioned after her appointment that she is taking Keflex x 2week, started x 1 week ago

## 2010-05-10 NOTE — Progress Notes (Signed)
Summary: Prior Auth  Phone Note Refill Request Message from:  Pharmacy on September 08, 2009 8:52 AM  Refills Requested: Medication #1:  CRESTOR 20 MG TABS 1 once daily EXCEPT 1/2 Tues   Dosage confirmed as above?Dosage Confirmed Prior Auth [phone] [facility]  Initial call taken by: Harold Barban,  September 08, 2009 8:52 AM  Follow-up for Phone Call        Nedra Hai from her company loaded a claim that she can get refills for 1 year, no need for an authorization she states. Refaxed in rx. Has to be 26 pills. Army Fossa CMA  September 08, 2009 2:14 PM     Prescriptions: CRESTOR 20 MG TABS (ROSUVASTATIN CALCIUM) 1 once daily EXCEPT 1/2 Tues , Th  & Sat  #26 x 5   Entered by:   Army Fossa CMA   Authorized by:   Loreen Freud DO   Signed by:   Army Fossa CMA on 09/08/2009   Method used:   Electronically to        [facility]* (retail)       [address]       [place], [place]  [address]       Ph: [phone]       Fax: [phone]   RxID:   [id number]

## 2010-05-10 NOTE — Progress Notes (Signed)
Summary: refill  Phone Note Refill Request Message from:  Fax from Pharmacy on February 12, 2010 1:22 PM  Refills Requested: Medication #1:  MAGIC MOUTHWASH gargle 5 cc three times a day and swallow. [place] - fax [phone]  Initial call taken by: Okey Regal [place],  February 12, 2010 1:23 PM    Prescriptions: MAGIC MOUTHWASH gargle 5 cc three times a day and swallow  #90cc x 0   Entered by:   Shonna Chock CMA   Authorized by:   Marga Melnick MD   Signed by:   Shonna Chock CMA on 02/12/2010   Method used:   Faxed to ...       [facility]* (retail)       [address]       [place], [place]  [id number]       Ph: [phone]       Fax: [phone]   RxID:   [id number]

## 2010-05-10 NOTE — Progress Notes (Signed)
Summary: has mildly enlarged heart   Phone Note Call from Patient   Caller: Patient [phone] Reason for Call: Talk to Nurse Summary of Call: pt would like a call from heather, knows it will be 10-27,chest xray done 10-14 showed  mildly enlarged heart what to do? Initial call taken by: Glynda Jaeger,  January 30, 2010 11:27 AM  Follow-up for Phone Call        I reviewed the above with Dr. Juanda Chance. Per Dr. Juanda Chance- schedule echo. The pt is aware and agreeable. Echo scheduled for 02/12/10 @ 4:00pm. 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

## 2010-06-06 ENCOUNTER — Ambulatory Visit: Payer: Self-pay | Admitting: Cardiology

## 2010-06-13 ENCOUNTER — Telehealth (INDEPENDENT_AMBULATORY_CARE_PROVIDER_SITE_OTHER): Payer: Self-pay | Admitting: *Deleted

## 2010-06-19 NOTE — Progress Notes (Signed)
Summary: med causing memory loss  Phone Note Outgoing Call   Call placed by: Doristine Devoid CMA,  June 13, 2010 2:32 PM Call placed to: Patient Summary of Call: Patient called says that she heard information that crestor may cause memory loss and would lie for cholesterol medication be changed.  Initial call taken by: Doristine Devoid CMA,  June 13, 2010 2:34 PM  Follow-up for Phone Call        The agents in question are Lipitor & Zocor, not Crestor. With her PMH of CAD , stopping Crestor would be associated with incredibly high risk of coronary event. I take Crestor 20 mg once daily . My father dropped dead of MI @ 39. I want to live long enough to have memory loss !  Follow-up by: Marga Melnick MD,  June 14, 2010 5:09 AM  Additional Follow-up for Phone Call Additional follow up Details #1::        spoke w/ patient aware of information about medication ....Marland KitchenMarland KitchenDoristine Devoid CMA  June 14, 2010 9:46 AM

## 2010-06-20 ENCOUNTER — Ambulatory Visit (INDEPENDENT_AMBULATORY_CARE_PROVIDER_SITE_OTHER): Payer: Managed Care, Other (non HMO) | Admitting: Internal Medicine

## 2010-06-20 ENCOUNTER — Encounter: Payer: Self-pay | Admitting: Internal Medicine

## 2010-06-20 DIAGNOSIS — I2510 Atherosclerotic heart disease of native coronary artery without angina pectoris: Secondary | ICD-10-CM

## 2010-06-20 DIAGNOSIS — G252 Other specified forms of tremor: Secondary | ICD-10-CM

## 2010-06-20 DIAGNOSIS — G250 Essential tremor: Secondary | ICD-10-CM

## 2010-06-20 DIAGNOSIS — N951 Menopausal and female climacteric states: Secondary | ICD-10-CM

## 2010-06-20 DIAGNOSIS — R7309 Other abnormal glucose: Secondary | ICD-10-CM

## 2010-06-26 ENCOUNTER — Other Ambulatory Visit (INDEPENDENT_AMBULATORY_CARE_PROVIDER_SITE_OTHER): Payer: PRIVATE HEALTH INSURANCE

## 2010-06-26 DIAGNOSIS — E785 Hyperlipidemia, unspecified: Secondary | ICD-10-CM

## 2010-06-26 DIAGNOSIS — G250 Essential tremor: Secondary | ICD-10-CM

## 2010-06-26 DIAGNOSIS — R7309 Other abnormal glucose: Secondary | ICD-10-CM

## 2010-06-26 DIAGNOSIS — I2510 Atherosclerotic heart disease of native coronary artery without angina pectoris: Secondary | ICD-10-CM

## 2010-06-26 LAB — T3, FREE: T3, Free: 2.2 pg/mL — ABNORMAL LOW (ref 2.3–4.2)

## 2010-06-26 LAB — T4, FREE: Free T4: 0.81 ng/dL (ref 0.60–1.60)

## 2010-06-26 NOTE — Assessment & Plan Note (Signed)
Summary: Talk to Dr. Miles Costain   Vital Signs:  Patient profile:   70 year old female Weight:      163.8 pounds BMI:     31.06 Pulse rate:   84 / minute Resp:     15 per minute BP sitting:   126 / 74  (left arm) Cuff size:   large  Vitals Entered By: Shonna Chock CMA (June 20, 2010 3:27 PM) CC: Talk with Dr   Primary Care Provider:  Marga Melnick MD  CC:  Talk with Dr.  History of Present Illness:    [name]  is concerned about her cholesterol meds causing memory loss ;she questioned stopping Crestor . I discussed risk in context of PMH of MI 68. She will be seeing Dr Shirlee Latch. Dr Sandria Manly is treating her for essential tremor. I recommended  [facility] Heart Panel to optimally assess risks & options. Debbora Dus NP has recommended TFTs for hot flashes.   Other symptoms include chest pain/pressure @ rest but not exertionally.She has been unable to complete stress test because of "allergy" to medium used. She has appt with Dr Shirlee Latch 04/15.  The patient denies the following symptoms: dypsnea ( except with extreme exertion), syncope, and pedal edema.  Compliance with medications (by patient report) has been near 100%.  Dietary compliance has been good.  The patient reports no exercise due to ankle issues.  Adjunctive measures currently used by the patient include niacin, fish oil supplements, and Co-Q 10.    Current Medications (verified): 1)  Lorazepam 2 Mg  Tabs (Lorazepam) .Marland Kitchen.. 1 Po Am, 1 1/2 @ Night: Prescribed By Dr Jennelle Human Only 2)  Nexium 40 Mg  Pack (Esomeprazole Magnesium) .... Take One Tablet Once A Day 3)  Coq10 100mg  .... 1 By Mouth Two Times A Day 4)  Vit B .... 1 By Mouth Two Times A Day 5)  Fish Oil 1000 Mg Caps (Omega-3 Fatty Acids) .Marland Kitchen.. 1 By Mouth Once Daily 6)  Zyprexa 2.5 Mg  Tabs (Olanzapine) .... Alternates Between 1 Pill and 1/2 Pill 7)  Tigan 300 Mg  Caps (Trimethobenzamide Hcl) .... As Needed Nausea 8)  Melatonin 1mg  .... As Needed 9)  Benadryl .... Prn 10)  Bupropion 300mg   .... 1 Tab Qd 11)  Calcium 500mg  .... 1 Daily 12)  Depakote 125mg  .... 1 Tab Bid 13)  Pepcid Ac .... Prn 14)  Crestor 20 Mg Tabs (Rosuvastatin Calcium) .Marland Kitchen.. 1 Once Daily Except 1/2 Tues , Th  & Sat **appointment Due 05/2010** 15)  Metformin Hcl 500 Mg Xr24h-Tab (Metformin Hcl) .Marland Kitchen.. 1 Once Daily With Largest Meal **labs Due 05/2009** 16)  Diclofenac Sodium Cr 100 Mg Xr24h-Tab (Diclofenac Sodium) .Marland Kitchen.. 1 By Mouth Once Daily As Needed 17)  Zetia 10 Mg Tabs (Ezetimibe) .Marland Kitchen.. 1 By Mouth At Bedtime 18)  Tylenol Extra Strength 500 Mg Tabs (Acetaminophen) .... As Needed (Almost Daily) 19)  Excedrin Migraine 250-250-65 Mg Tabs (Aspirin-Acetaminophen-Caffeine) .... As Needed 20)  Fluticasone Propionate 50 Mcg/act Susp (Fluticasone Propionate) .Marland Kitchen.. 1 Spray Two Times A Day Into L Nostril After Using Neti Rinse Device 21)  Magic Mouthwash .... Gargle 5 Cc Three Times A Day and Swallow  Allergies: 1)  ! * Emycin 2)  ! * [cm 3)  ! * Pain Meds 4)  ! Zithromax 5)  ! * Dynabac 6)  ! Thorazine 7)  ! Lidocaine 8)  ! Keflex 9)  ! Levsin 10)  ! * Adderall 11)  ! * Ketek 12)  ! *  Topamax 13)  ! Flagyl 14)  ! Cipro 15)  ! Penicillin 16)  ! Lipitor 17)  ! Doxycycline 18)  ! Zetia (Ezetimibe) 19)  ! * Sulfamethoxazole  Review of Systems General:  Complains of fatigue; Weight down 10# with diet. Eyes:  Denies double vision and vision loss-both eyes. ENT:  Complains of difficulty swallowing and hoarseness. CV:  Denies palpitations. GI:  Denies constipation; stool changes with Metformin. Derm:  Denies changes in nail beds, dryness, and hair loss. Neuro:  Denies numbness and tingling. Endo:  Denies cold intolerance and heat intolerance.  Physical Exam  General:  in no acute distress; alert  and cooperative throughout examination Eyes:  No corneal or conjunctival inflammation noted. EOMI. Perrla.No lid lag Neck:  No deformities, masses, or tenderness noted. Lungs:  Normal respiratory effort, chest  expands symmetrically. Lungs are clear to auscultation, no crackles or wheezes. Heart:  Normal rate and regular rhythm. S1 and S2 normal without gallop, murmur, click, rub .S4 with slurring Pulses:  R and L carotid,radial,dorsalis pedis  pulses are full and equal bilaterally. Decreased PTP Extremities:  No clubbing, cyanosis, edema. Neurologic:  alert & oriented X3 and DTRs symmetrical and 1/2+; essential tremor LUE  Skin:  Intact without suspicious lesions or rashes Psych:  memory intact for recent and remote, normally interactive, good eye contact, and subdued.     Impression & Recommendations:  Problem # 1:  CAD (ICD-414.00) S/P MI 1999  Problem # 2:  HOT FLASHES (ICD-627.2)  Problem # 3:  HYPERLIPIDEMIA-MIXED (ICD-272.4)  Her updated medication list for this problem includes:    Crestor 20 Mg Tabs (Rosuvastatin calcium) .Marland Kitchen... 1 once daily except 1/2 tues , th  & sat **appointment due 05/2010**    Zetia 10 Mg Tabs (Ezetimibe) .Marland Kitchen... 1 by mouth at bedtime  Problem # 4:  HYPERGLYCEMIA, FASTING (ICD-790.29)  Her updated medication list for this problem includes:    Metformin Hcl 500 Mg Xr24h-tab (Metformin hcl) .Marland Kitchen... 1 once daily with largest meal **labs due 05/2009**  Problem # 5:  TREMOR, ESSENTIAL (ICD-333.1)  Complete Medication List: 1)  Lorazepam 2 Mg Tabs (Lorazepam) .Marland Kitchen.. 1 po am, 1 1/2 @ night: prescribed by dr cottle only 2)  Nexium 40 Mg Pack (Esomeprazole magnesium) .... Take one tablet once a day 3)  Coq10 100mg   .... 1 by mouth two times a day 4)  Vit B  .... 1 by mouth two times a day 5)  Fish Oil 1000 Mg Caps (Omega-3 fatty acids) .Marland Kitchen.. 1 by mouth once daily 6)  Zyprexa 2.5 Mg Tabs (Olanzapine) .... Alternates between 1 pill and 1/2 pill 7)  Tigan 300 Mg Caps (Trimethobenzamide hcl) .... As needed nausea 8)  Melatonin 1mg   .... As needed 9)  Benadryl  .... Prn 10)  Bupropion 300mg   .... 1 tab qd 11)  Calcium 500mg   .... 1 daily 12)  Depakote 125mg   .... 1 tab  bid 13)  Pepcid Ac  .... Prn 14)  Crestor 20 Mg Tabs (Rosuvastatin calcium) .Marland Kitchen.. 1 once daily except 1/2 tues , th  & sat **appointment due 05/2010** 15)  Metformin Hcl 500 Mg Xr24h-tab (Metformin hcl) .Marland Kitchen.. 1 once daily with largest meal **labs due 05/2009** 16)  Diclofenac Sodium Cr 100 Mg Xr24h-tab (Diclofenac sodium) .Marland Kitchen.. 1 by mouth once daily as needed 17)  Zetia 10 Mg Tabs (Ezetimibe) .Marland Kitchen.. 1 by mouth at bedtime 18)  Tylenol Extra Strength 500 Mg Tabs (Acetaminophen) .... As needed (almost daily) 19)  Excedrin Migraine 250-250-65 Mg Tabs (Aspirin-acetaminophen-caffeine) .... As needed 20)  Fluticasone Propionate 50 Mcg/act Susp (Fluticasone propionate) .Marland Kitchen.. 1 spray two times a day into l nostril after using neti rinse device 21)  Magic Mouthwash  .... Gargle 5 cc three times a day and swallow  Patient Instructions: 1)  Please consider fasting labs: 2)  [facility] Heart Lipid Panel (1304X), ICD-9:272.4, 414.00 3)  TSH , free T3, free T4, ICD-9: 333.1; 272.4 4)  HbgA1C, ICD-9:790.29. (Note : 31 min , > 50% as counselling )   Orders Added: 1)  Est. Patient Level III [04540]

## 2010-06-30 ENCOUNTER — Encounter: Payer: Self-pay | Admitting: Cardiology

## 2010-07-02 ENCOUNTER — Telehealth: Payer: Self-pay | Admitting: Internal Medicine

## 2010-07-02 NOTE — Telephone Encounter (Signed)
Patient would like to try Prilosec.  If O.K., they need strength and directions

## 2010-07-04 ENCOUNTER — Telehealth: Payer: Self-pay | Admitting: Internal Medicine

## 2010-07-04 MED ORDER — DICYCLOMINE HCL 20 MG PO TABS: 20.0000 mg | ORAL_TABLET | Freq: Two times a day (BID) | ORAL | Status: DC

## 2010-07-04 NOTE — Telephone Encounter (Signed)
Pt with severe IBS, please try Bentyl 20 mg, #20 ,no refill,  1 po bid, stay on full liquid diet x 24 hrs

## 2010-07-04 NOTE — Telephone Encounter (Signed)
Spoke with patient and gave her Dr. Regino Schultze recommendations. Reviewed full liquid diet with patient. Rx to pharmacy,

## 2010-07-04 NOTE — Telephone Encounter (Signed)
Calling to report that since last night, she has had lower abdominal pain below her belly button that goes all across her abdomen. Rate the pain at 7/8. Also, has some pain under her ribs. States "It just hurts." She first thought it was indigestion. States she has lots of gas and is having chills. She does not have a thermometer and does not know if she has a temp. States she has been having diarrhea 3-4/day for the last week. She thought this was due to her Metformin. Denies cramps, nausea, vomiting, recent antibiotic use or travel out of the country. States she is under a lot of stress due to a positive PET scan of her husband and he possibly has cancer. Last colonoscopy- 02/23/09- mod. Diverticulosis, internal hemorrhoids.hx IBS. Please, advise.

## 2010-07-04 NOTE — Telephone Encounter (Signed)
Patient has a headache and wants to know if she can take Tylenol with the Bentyl. Patient instructed this is okay.

## 2010-07-10 ENCOUNTER — Ambulatory Visit: Payer: Self-pay | Admitting: Cardiology

## 2010-07-10 MED ORDER — OMEPRAZOLE MAGNESIUM 20 MG PO TBEC: DELAYED_RELEASE_TABLET | ORAL | Status: DC

## 2010-07-10 NOTE — Telephone Encounter (Signed)
If she still wants to try the Prilosec the usual dose is  20 mg 30 minutes before breakfast. If this fails to control her symptoms totally; it could be increased to 30 minutes before theevening  meal as well dispense #60.

## 2010-07-10 NOTE — Telephone Encounter (Signed)
Spoke w/ pt aware of information.  

## 2010-07-11 ENCOUNTER — Other Ambulatory Visit: Payer: Self-pay | Admitting: Internal Medicine

## 2010-07-17 ENCOUNTER — Encounter: Payer: Self-pay | Admitting: Internal Medicine

## 2010-08-07 ENCOUNTER — Other Ambulatory Visit: Payer: Self-pay | Admitting: Internal Medicine

## 2010-08-07 ENCOUNTER — Ambulatory Visit: Payer: Self-pay | Admitting: Cardiology

## 2010-08-21 NOTE — Assessment & Plan Note (Signed)
[facility]                            CARDIOLOGY OFFICE NOTE   OREATHA, FABRY                         MRN:          [id number]  DATE:07/16/2007                            DOB:          12-12-1940    PRIMARY CARE PHYSICIAN:  Titus Dubin. Alwyn Ren, MD,FACP,FCCP   CLINICAL HISTORY:  [name] is 70 years old and returns for follow-up  management of her coronary heart disease.  She had a diaphragmatic wall  infarction in 1990 and treated with balloon angioplasty only.  Her last  catheterization was performed in April of 2007, at which time her right  coronary was widely patent, and she had 70% narrowing in the proximal  circumflex artery.  She subsequently had a negative Myoview scan.  She  has done fairly well from the standpoint of her heart with no recent  chest pain or palpitations.  She says gets short of breath with  exertion, and she attributes some of this to her weight.   PAST MEDICAL HISTORY:  1. Irritable bowel syndrome.  2. GERD.  3. Fibromyalgia.   CURRENT MEDICATIONS:  1. Sertraline.  2. Nexium.  3. Crestor 20 mg daily.  4. Vitamin D.  5. Tums.  6. CoQ10.  7. Niacin 500 mg daily.  8. Citrucel.  9. Zetia 10 mg daily.  10.Depakote.   PHYSICAL EXAMINATION:  VITAL SIGNS:  Blood pressure is 144/82 and pulse  71 and regular.  NECK:  There was no venous tension.  The carotid pulses were full  without bruits.  CHEST:  Clear.  CARDIAC:  Rhythm was regular.  I could hear no murmurs or gallops.  ABDOMEN:  Soft with normal bowel sounds.  There was no  hepatosplenomegaly.  EXTREMITIES:  Peripheral pulses were full with no peripheral edema.   LABORATORY DATA:  Her recent lipid profile by Dr. hopper was good with  the exception of an HDL of 30.   IMPRESSION:  1. 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  2. Hyperlipidemia with low HDL.  3. INTOLERANCE TO ASPIRIN AND PLAVIX.  4. Irritable bowel syndrome.  5. Gastroesophageal reflux disease.  6. Fibromyalgia.   RECOMMENDATIONS:  I think [name] is doing reasonably well.  And talked to  her about avoiding bad carbohydrates to try and reach her HDL, as well  as weight loss and exercise.  I also talked about increasing her niacin  to a gram if she thinks she can tolerate this.  Will plan to see her  back in follow up in the year.     Bruce Elvera Lennox Juanda Chance, MD, [facility]  Electronically Signed    BRB/MedQ  DD: 07/16/2007  DT: 07/16/2007  Job #: [id number]

## 2010-08-21 NOTE — Assessment & Plan Note (Signed)
[facility]                                 ON-CALL NOTE   [name], [name]                         MRN:          [id number]  DATE:03/25/2008                            DOB:          June 23, 1940     She called today from [phone].  She is a patient Dr. Regino Schultze, with  known history of coronary artery disease and angioplasty in the past.  She has been complaining of substernal chest discomfort over 1 week in  duration on and off, becoming progressively worse today after coming in  carrying groceries.  She describes it as pain on the right side of her  chest radiating to the left with some heartburn like symptoms.  Secondary to these symptoms, she called [place] for recommendations.  I have  advised the patient that she needs to be seen in the emergency room.  She states the chest pain has been ongoing and becoming worse with  associated shortness of breath.  She was reluctant to come stating that  she probably wanted to lay down first, however, I explained to her if it  was important enough to her that she called [place]  concerning this  discomfort she should probably be seen in the emergency room.  She  wishes to wait on her husband to get home and she states that she will  go to Urgent Care and will be seen in ER.     Bettey Mare. Lyman Bishop, NP  Electronically Signed    KML/MedQ  DD: 03/25/2008  DT: 03/26/2008  Job #: [id number]

## 2010-08-21 NOTE — Assessment & Plan Note (Signed)
[facility]                         GASTROENTEROLOGY OFFICE NOTE   DARON, BREEDING                         MRN:          [id number]  DATE:06/30/2007                            DOB:          [birth year]    [name] is a 70 year old white female with multiple medical and  psychological problems, migraine headaches, fibromyalgia, depression,  followed by several gastroenterologists in the past, initially Dr.  Jarold Motto until 2002 then Dr. Loreta Ave, whom she saw briefly, and then Dr.  Sherin Quarry for about 3 years.  She is now seeking another opinion of her  irritable bowel syndrome.  She has had numerous studies which I have  reviewed.  She had at least 2 sets of GI evaluations which both included  upper endoscopy and colonoscopy last done by Dr. Sherin Quarry in 2004.  She  would be due for another colonoscopy in 2014.  She has also multiple  intolerances to medications, laxatives, antispasmodics, psychotropic  agents.  She seeks another opinion, but her main complaint is lower  abdominal discomfort, gas, difficult evacuation of the stool.  Patient  describes a recent trip to [place] which lasted 5 days, during  which she did not have any bowel movement.  After returning home she has  had multiple bowel movements and currently is feeling much better.  She  had vaginal repair of cystocele in the past, rectocele repair, partial  hysterectomy in 1999 with anterior repair.  She also had hemorrhoidal  banding in the past.  She has to use manual abdominal pressure to  evacuate.  She does not do much exercise because she has some feet  problem.  Patient says she is unable to tolerate MiraLax because it is  too strong for her.  She takes Milk of Magnesia, as a standby, about  once a week.  She uses manual disimpaction at times.   MEDICATIONS:  1. Mucomyst.  2. Crestor 20 mg daily.  3. Vitamin D 1000 mg daily.  4. Tums.  5. CoQ 150 mg daily.  6. Niacin.  7.  Citrucel two tablets daily.  8. Zetia 10 mg daily.  9. Fluticasone.  10.Propionate Nasal Spray.  11.Lorazepam 2 mg daily.  12.Colace p.r.n.  13.Excedrin.  14.Tigan.  15.Lorazepam.   PAST HISTORY:  1. Diaphragmatic MI status post PTCA in 1999.  2. Chronic anxiety.  3. Depression.  4. Chronic headaches.  5. Allergies.   SURGERIES:  1. Hemorrhoidal banding.  2. Angioplasty.  3. Vaginal hysterectomy with anterior repair.  4. Bladder sling operation.  5. Sacropexy.   FAMILY HISTORY:  Positive for:  1. Heart disease in father, brother and sister.  2. Diabetes in half sister.  3. Alcoholism in sister.   SOCIAL HISTORY:  1. Married with 3 children.  2. She has Bachelor's Degree from [employer] in Teaching/Education.  3. Patient does not smoke or drink.   REVIEW OF SYSTEMS:  Patient is overweight.  She is failing to lose  weight.  Eyeglasses.  Allergies.  Urination changes.  Arthritic  complaints.  Shortness of breath.  Leakage of urine.  Confusion.  PHYSICAL EXAM:  Blood pressure 126/74, pulse 84, weight 170 pounds.  The  patient was rather anxious but alert and oriented, cooperative.  She was  clearly overweight.  Sclera nonicteric.  NECK:  Supple, no adenopathy.  LUNGS:  Clear to auscultation.  COR:  With quiet precordium, normal S1, normal S2.  ABDOMEN:  Obese but soft, somewhat protuberant with normoactive bowel  sounds.  There was significant tenderness in the left lower and left  middle quadrants.  No palpable mass or rebound.  RECTAL EXAM:  Normal, no external hemorrhoids.  Stool was soft,  Hemoccult negative.   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  She already had  pelvic surgery to tack up her bladder as well as rectum and this is  probably a recurrence of the symptoms.  She still is anxious and  depressed which  augments her problem together with the fibromyalgia.  She is up to date on her colonoscopy.   PLAN:  I had a long discussion with the patient about expectations from  my consultation.  She is aware of the fact that she has been difficult,  that her symptoms are likely to be chronic and that we will not be able  to completely eradicate them.  We will proceed with Sitzmarks study to  assess the colonic motility.  1. Do a Sitzmarks study.  2. Start Amitiza 24 mcg a day, she may increase it to two a day as      needed.  3. High fiber diet.  Continue all other medications.  4. I would like to see her again in approximately 2 months and      reassess her symptoms.     Janet Morton. Juanda Chance, MD  Electronically Signed    DMB/MedQ  DD: 06/30/2007  DT: 06/30/2007  Job #: [id number]   cc:   Titus Dubin. Alwyn Ren, MD,FACP,FCCP

## 2010-08-21 NOTE — Assessment & Plan Note (Signed)
[facility]                            CARDIOLOGY OFFICE NOTE   Janet, Walls                         MRN:          [id number]  DATE:01/06/2008                            DOB:          [birth year]    PRIMARY CARDIOLOGIST:  Everardo Beals. Juanda Chance, MD, [facility]   HISTORY OF PRESENT ILLNESS:  This is a 70 year old white female patient  of Dr. Charlies Constable who has a history of coronary artery disease status  post PMI in 1990  treated with balloon angioplasty only.  Her last cath  was in April 2007, at which time, her RCA was widely patent, and she had  70% narrowing in her proximal circumflex.  Subsequent Myoview was  negative.  She last saw Dr. Charlies Constable in April 2009,  at which time  he placed her on Plavix.  She has been intolerant to aspirin and has  never taken Plavix because, she thought it works same as aspirin.  She  did not fill this prescription initially and probably did not start  until May.  She comes in today because of 2 bruises, she has a bruise on  each breast.  This just came up yesterday and she does not remember  bumping or anything.  She has no bruises anywhere else.  She does have a  severe migraine today and her blood pressure is elevated, but she did  take some Excedrin Migraine before she arrived.   CURRENT MEDICATIONS:  1. Calcium 600 mg daily.  2. Gaviscon p.r.n.  3. B complex daily.  4. Stool softener daily.  5. Fish oil daily.  6. Bupropion 300 mg daily.  7. Pravastatin 20 mg nightly.  8. Plavix 75 mg daily.  9. Depakote 125 mg b.i.d.  10.Zyprexa 2.5 mg nightly.  11.Afrin nasal spray p.r.n.  12.Sodium chloride 5% p.r.n.  13.Claritin 10 mg daily.   PHYSICAL EXAMINATION:  GENERAL:  This is a pleasant 70 year old white  female, in no acute distress.  VITAL SIGNS:  Blood pressure is 148/99, pulse 80, weight 169.  NECK:  Without JVD, HJR bruit, or thyroid enlargement.  LUNGS:  Clear anterior, posterior, and lateral.  HEART:   Regular rate and rhythm at 80 beats per minute.  Normal S1 and  S2.  No murmur, rub, bruit, thrill or heave noted.  BREASTS:  She has a bruise on each breast blue and yellow very small in  size.  ABDOMEN:  Soft without organomegaly, masses, lesions, or abnormal  tenderness.  EXTREMITIES:  Without cyanosis, clubbing, or edema.  She has good distal  pulses.   EKG normal sinus rhythm, nonspecific ST-T wave changes.  No acute  change.   IMPRESSION:  1. Bruising on Plavix.  2. Coronary artery disease status post myocardial infarction in 1999      treated with balloon angioplasty with last catheterization in 2007.      3.  Nonobstructive disease, 70% proximal circumflex  3. Hypertension today, most likely secondary to migraine headache.  4. Hyperlipidemia.  5. Intolerance to aspirin.  6. Irritable bowel syndrome.  7. Gastroesophageal reflux  disease.  8. Fibromyalgia.   PLAN:  The bruising on her breast are very slight.  I do not think it  warrant stopping the Plavix at this time.  I have asked her watch for  continued bruising elsewhere and to call if there is any further  problems.  She will come back for blood pressure check next week, as she  has never had hypertension and I do suspect it is due to her migraine  headaches that she is currently having.  She will follow up with Dr.  Juanda Chance has previously scheduled or sooner if needed.      Jacolyn Reedy, PA-C  Electronically Signed      Everardo Beals. Juanda Chance, MD, [facility]  Electronically Signed   ML/MedQ  DD: 01/06/2008  DT: 01/07/2008  Job #: [id number]

## 2010-08-24 NOTE — Op Note (Signed)
NAME:  [name], [name]                  ACCOUNT NO.:  [id number]   MEDICAL RECORD NO.:  [id number]          PATIENT TYPE:  AMB   LOCATION:  [facility]                         FACILITY:  [facility]   PHYSICIAN:  Ronald L. Earlene Plater, M.D.  DATE OF BIRTH:  [birth year]   DATE OF PROCEDURE:  09/26/2004  DATE OF DISCHARGE:                                 OPERATIVE REPORT   DIAGNOSES:  Type 3 stress urinary incontinence.   OPERATIVE PROCEDURE:  Placement of [employer] suprapubic sling and  cystourethroscopy.   SURGEON:  Lucrezia Starch. Earlene Plater, M.D.   ANESTHESIA:  General endotracheal.   ESTIMATED BLOOD LOSS:  50 cc.   TUBES:  None, except Vaginal pack.   COMPLICATIONS:  None.   INDICATIONS FOR PROCEDURE:  [name] is a lovely 70 year old white female  with a complicated urologic history. She had abdominal cystoenterocele  repair with a Burch procedure in [place], [place], at the [facility].  She also had an vaginal sacropexy abdominally at that time. She has had  significant leakage, has been treated either two to three treatments with  Durasphere. It did help some, but the incontinence continued. She has been  worked up previously, and a sling has been recommended; however, she  underwent urodynamics which revealed a Valsalva leak point pressure of 19 cm  of water. She had fairly low sensation but a maximum bladder capacity of 486  cc and had a compliant bladder throughout the study, with no uninhibited  bladder contractions. After understanding the risks and alternatives, she  would like to proceed with a pubovaginal sling.   PROCEDURE IN DETAIL:  The patient was placed in supine position. After  proper general endotracheal anesthesia, was placed in the dorsal lithotomy  position, prepped and draped with Betadine in a sterile fashion. The labia  minora was sutured laterally with silk. A narrow Deaver was used as a  retractor. A 16-French Foley catheter was placed in the bladder, and the  bladder was drained. Approximately 1 cm vaginal incision was made vertically  at the urethrovesical junction after saline had been injected  subcutaneously. Small flaps were created laterally to the endopelvic fascia  bilaterally. Punch holes were then obtained one fingerbreadth superior and  two fingerbreadths lateral to the midline pubic symphysis, and utilizing the  passer needles, needles were passed to fingertip in both right and left  sides through the endopelvic fascia. Cystourethroscopy was then performed  with a 22.5-French Olympus panendoscope. Utilizing a 12 and 70 degree lens,  his bladder was completely filled and no punch holes or inner bladder injury  was noted. The sling was then placed in position with the proper tension so  that a hemostat could be admitted behind it. It was noted to be at the  urethrovesical junction. Again, good hemostasis was noted to be present. The  vaginal flap was closed with a running 2-0 Vicryl suture with a UR6 needle.  Cystourethroscopy was again  performed, and again the bladder was noted to have no perforations, and the  urethra appeared to be clear also. Bladder was drained. The  tape was cut at  the skin level. Skin closed and secured with Dermabond. A vaginal pack with  was placed with bacitracin ointment, and the patient was taken to the  recovery room stable.       RLD/MEDQ  D:  09/26/2004  T:  09/26/2004  Job:  [id number]

## 2010-08-24 NOTE — Discharge Summary (Signed)
[place]  Patient:    ADABELLE, GRIFFITHS Visit Number: [id number] MRN: [id number]          Service Type: EMS Location: [facility] Attending Physician:  Hanley Seamen Admit Date:  01/19/2001 Discharge Date: 01/19/2001   CC:         Titus Dubin. Alwyn Ren, M.D. [facility]  [facility]  Bruce R. Juanda Chance, M.D. [facility]  Candy Sledge, M.D.  Dr. Griffith Citron, [facility], [place], [phone]   Discharge Summary  DATE OF BIRTH:  03-02-1941  DISCHARGE DIAGNOSES: 1. Chronic nausea and vomiting. 2. Dehydration. 3. Hypokalemia. 4. Diarrhea. 5. Weakness. 6. Anxiety. 7. Coronary artery disease. 8. Probable irritable bowel syndrome.  DISCHARGE MEDICATIONS:  Resume home medicines.  CONSULTING PHYSICIANS:  GI, Dr. Marina Goodell.  PROCEDURE:  Small bowel follow through.  HISTORY OF PRESENT ILLNESS:  The patient is a 70 year old female with a history of chronic nausea and vomiting, food avoidance since August of 2002. She has numerous drug intolerances.  For further details, please see the rest of my history and physical.  HOSPITAL COURSE:  During the course of hospitalization, she was admitted for IV fluids and potassium replacement.  GI consult was called.  Dr. Marina Goodell saw her in consultation.  Small bowel follow through was done on January 06, 2001, and was normal.  She was found to be hypokalemic and this is to be replaced. She was feeling better and asked to be discharged so she could go to [facility] in [place] tomorrow for an appointment with a GI specialist specializing in nausea.  I will replace her potassium IV and I will be able to discharge her tonight.  In Dr. Broadus John opinion her nausea, weight loss associated with food avoidance was related to a functional disorder (anxiety and depression).  Formal psychiatry evaluation was suggested.  On the day of discharge, she is feeling better.  Her temperature was 98.1, blood pressure 100/52, pulse  70, respirations 20, O2 saturation 96% on room air.  Lungs clear, heart regular, HEENT moist, abdomen soft, sensitive diffusely.  No rebound and no masses.  The lower extremities are without edema. She was alert and oriented and cooperative.  LABORATORY DATA:  TSH 1.705, GGT 7, B12 460, folate 13.4, total protein 6.7, cholesterol total 204, LDL 148.  Her random cortisol was 7.1, CK 31, MB 0.3, troponin normal.  Her EKG showed normal sinus rhythm.  Small bowel follow through normal study.  Potassium 3.0.  Amylase and lipase normal.  Stool studies for Cryptospyridium, Giardia, ova and parasites pending. Attending Physician:  Hanley Seamen DD:  01/06/01 TD:  01/06/01 Job: [id number]

## 2010-08-24 NOTE — Assessment & Plan Note (Signed)
[facility]                            CARDIOLOGY OFFICE NOTE   [name], [name]                         MRN:          [id number]  DATE:07/09/2006                            DOB:          03-31-1941    PRIMARY CARE PHYSICIAN:  Titus Dubin. Alwyn Ren, MD,FACP,FCCP   CLINICAL HISTORY:  [name] is 70 years old and returns for management  of her coronary heart disease.  She had a diaphragmatic wall infarct in  1999 treated with balloon angioplasty.  In April of 2007, she had  exertional dyspnea and had a catheterization which showed 70 percent  stenosis in the circumflex artery.  She subsequently had a negative  Myoview scan.   She had done reasonably well from the standpoint of her heart since that  time.  However, she has been bothered by migraines over the last several  months and been on several medications which have caused side effects  and during this period she has been very inactive and gained weight.  She says she has shortness of breath with exertion and has occasional  chest pains.   PAST MEDICAL HISTORY:  1. Irritable bowel syndrome.  2. GERD.  3. Fibromyalgia.   ALLERGIES:  ASPIRIN, PLAVIX, AND MULTIPLE OTHER MEDICATIONS.   She has been able to take Crestor recently.   CURRENT MEDICATIONS:  Oxycodone, Flexeril, Nexium, Excedrin, Tigan,  lorazepam, Crestor, niacin, Citrucel.   PHYSICAL EXAMINATION:  VITAL SIGNS:  Blood pressure is 141/90 and the  pulse 82 and regular.  NECK:  There was no venous distention.  The carotid pulses were full  without bruits.  CHEST:  Clear.  CARDIAC:  Rhythm was regular.  No murmurs or gallops.  ABDOMEN:  Soft without organomegaly.  EXTREMITIES:  Peripheral pulses were full.  There is no peripheral  edema.   An electrocardiogram showed small inferior Q waves and nonspecific ST-T  changes.   IMPRESSION:  1. Coronary artery disease status post diaphragmatic wall infarct in      1999, treated with  balloon angioplasty with catheterization in 2007      showing 70 percent stenosis in the circumflex now stable.  2. Hyperlipidemia.  3. Intolerance to aspirin and Plavix.  4. Irritable bowel syndrome.  5. Gastroesophageal reflux disease.  6. Fibromyalgia.  7. Recent migraine headaches.   RECOMMENDATIONS:  [name] would like to start on an exercise program to try  and lose weight, and she is concerned about her coronary blockage.  We  will plan to evaluate her with an exercise rest/stress Myoview scan.  She also just restarted Crestor and we will plan to get a followup lipid  and liver profile in four to six weeks.  Her blood pressure is up  slightly today and we will get a blood pressure check with those visits.  If these tests are okay, I plan to see her back in followup in a year.     [name] Janet Chance, MD, [facility]  Electronically Signed    BRB/MedQ  DD: 07/09/2006  DT: 07/09/2006  Job #: [id number]   cc:  Titus Dubin. Alwyn Ren, MD,FACP,FCCP  Genene Churn. Love, M.D.

## 2010-08-24 NOTE — Cardiovascular Report (Signed)
NAME:  [name], [name] NO.:  [id number]   MEDICAL RECORD NO.:  [id number]          PATIENT TYPE:  OIB   LOCATION:  1965                         FACILITY:  [facility]   PHYSICIAN:  Charlies Constable, M.D. 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  She has done well since that time, but recently she  developed symptoms of exertional dyspnea with some discomfort in her chest.  She is seen by [name] __________ and [name] in the office and  scheduled for evaluation with catheterization and outpatient laboratory  today.   PROCEDURE:  The procedure was performed by the right femoral artery, an  arterial sheath, and 4-French preformed coronary catheters.  A front wall  anterior puncture was performed and Omnipaque contrast was used.  The  patient tolerated the procedure well and left the laboratory in satisfactory  condition.   RESULTS:  THE LEFT MAIN CORONARY ARTERY:  The left main coronary artery is  free of significant disease.   THE LEFT ANTERIOR DESCENDING ARTERY:  The left anterior descending artery  gave rise to 4 diagonals, 4 septal perforators and 1 distal diagonal branch.  These vessels were free of significant disease.   THE CIRCUMFLEX ARTERY:  The circumflex artery gave rise to a moderately  large ramus branch, a small very early marginal branch, a second fairly  early marginal branch, and a posterolateral branch.  There was 70% narrowing  after the first small marginal branch and just before the second larger  marginal branch.  There is also an ostial lesion in the AV portion of the  circumflex artery at the second marginal branch which is also about 70%.   THE RIGHT CORONARY ARTERY:  The right coronary artery  is a moderate-sized  vessel, gave rise to a right ventricular conus branch, a right ventricular  branch, a posterior descending branch and 2 posterolateral branches.  There  is 40% narrowing in the proximal right coronary.   THE LEFT VENTRICULOGRAM:  The left ventriculogram was performed to the RAO  which showed good wall motion with no areas of hypokinesis.  The estimated  ejection fraction was 60%.   Aortic pressure was 127/71 with a mean of 94, and left ventricular pressure  was 127/16.   CONCLUSION:  Coronary artery disease, status post prior diaphragmatic wall  infarction treated with PTCA of the right coronary with 50% narrowing in the  mid left anterior descending artery, 70% narrowing in the proximal  circumflex artery, and 40% narrowing in the proximal right coronary artery  with normal LV function.   RECOMMENDATIONS:  The lesion in the circumflex artery is a little bit  unusual.  There is a abrupt change in caliber in the vessel after the ramus  and very early marginal branch.  Depending on whether the lesion is compared  to  the proximal reference diameter or distal reference diameter greatly  affects the severity of the lesion, but we estimate it to be about 70%.  I  am not certain if this a flow-limiting lesions or not, and we will plan to  evaluate with  an exercise Cardiolite scan and then make a decision about intervention.  Intervention may be somewhat difficult since we have to cross a second  diagonal branch and the distal reference vessel is not extremely large.  She  also has a problem with statins and we will have to see what we can do to  encourage continued statin compliance.           ______________________________  Charlies Constable, M.D. [facility]     BB/MEDQ  D:  08/01/2005  T:  08/02/2005  Job:  [id number]   cc:   Titus Dubin. Alwyn Ren, M.D. [facility]  [phone] [place]  [place] [address]   Charlies Constable, M.D. [facility]  [address]  [address]  [place]  [place]  [address]

## 2010-08-24 NOTE — Assessment & Plan Note (Signed)
[name]                              CARDIOLOGY OFFICE NOTE   [name], [name]                         MRN:          [id number]  DATE:11/27/2005                            DOB:          03-05-1941    PRIMARY CARE PHYSICIAN:  [name].   CLINICAL HISTORY:  [name] is 70 year old and had a diaphragmatic wall  infarction in 1999, treated with balloon angioplasty.  She recently  developed symptoms of dyspnea on exertion and had a catheterization done  which showed 70% lesion in the proximal circumflex artery.  She subsequently  had a Cardiolite scan which showed no ischemia, and we have been managing  this medically.   She has hyperlipidemia, and she had been on Crestor, and her LDL had come  down nicely to 70, but she developed side effects from this and had to stop  this initially and then cut back to 1/2 of a 20 mg tablet every other day.  She had a lipid profile in June 2007 when she was off of the statin, and her  LDL was 144.  It had gotten down to 64 when she was taking the Crestor.  Also, her CRP had gone from 4 down to less than 1, so we think that the 4  may have been related to an infection at the time.   PAST MEDICAL HISTORY:  1. Irritable bowel syndrome.  2. GERD.  3. Fibromyalgia.  4. She has an intolerance to ASPIRIN.   PHYSICAL EXAMINATION:  VITAL SIGNS:  Blood pressure was 118/66, and the  pulse 68 and regular.  NECK:  There is no venous distention.  The carotid pulses were full, without  bruits.  CHEST:  Clear.  CARDIAC:  Rhythm was regular.  No murmurs or gallops.  ABDOMEN:  Soft, without organomegaly.  EXTREMITIES:  Peripheral pulses full.  There is no peripheral edema.   IMPRESSION:  1. Coronary artery disease, status post diaphragmatic wall infarction in      1999, treated with percutaneous transluminal coronary angioplasty, with      recent catheterization showing 70% stenosis in the  circumflex artery.  2. Hyperlipidemia.  3. Intolerance to ASPIRIN.  4. Irritable bowel syndrome.  5. Gastroesophageal reflux disease.  6. Fibromyalgia.   RECOMMENDATIONS:  [name] is back on her Crestor 20 mg 1/2 tablet every other  day, so we will recheck a lipid profile next week and also get a liver  profile.  It does not look like she will be able to tolerate a higher dose  than this, so hopefully this will give some improvement.  She is working  with a nutritionist named [name].  She also has been working out  at a fitness [facility] 6 days a week.  Will plan to see her back in October  2007 at her request.  [name] is getting married down near  [facility] next month.  Janet Elvera Lennox Juanda Chance, MD, [facility]    BRB/MedQ  DD:  11/27/2005  DT:  11/28/2005  Job #:  [id number]

## 2010-08-24 NOTE — Assessment & Plan Note (Signed)
[facility]                              CARDIOLOGY OFFICE NOTE   CAROLENE, GITTO                         MRN:          [id number]  DATE:01/21/2006                            DOB:          1940/12/08    PRIMARY CARE PHYSICIAN:  Marga Melnick, M.D.   CLINICAL HISTORY:  Ms. Romberg is 70 years old and returns for management of  her coronary heart disease.  She had a diaphragmatic wall infarction in 1999  treated with balloon angioplasty.  She had a recent catheterization  performed in April of this year because of symptoms of exertional dyspnea  and was found to have a 70% lesion in the circumflex artery.  She  subsequently had a negative Cardiolite and she has been managed medically.   She had had a great deal of difficulty with tolerance of medications.  Currently she is able to take Crestor 40 mg one-half tablet every-other day  for her hyperlipidemia.  She is allergic to ASPIRIN and is currently on  Plavix, although she ran out recently.   She said she had been doing fairly well from the standpoint of her heart.  She has had some shortness of breath with exertion and has gained about 4  pounds and attributes this to that partly.  She has had no chest pain, no  palpitations.   PAST MEDICAL HISTORY:  Significant for:  1. Irritable bowel syndrome.  2. GERD.  3. Fibromyalgia.   She is intolerant to ASPIRIN and other medications.   SOCIAL HISTORY:  Her daughter, French Ana, just recently got married.  She has  another son, Riley Lam, who works for the family business.   CURRENT MEDICATIONS:  Include lorazepam, Zyprexa, Nexium, Wellbutrin,  Citrucel, niacin, fish oil, Crestor, and Plavix.   EXAMINATION TODAY:  VITAL SIGNS:  The blood pressure is 136/83 and the pulse  59 and regular.  NECK:  There was no venous distention.  Carotid pulses were full without  bruits.  CHEST:  Clear.  CARDIAC:  Rhythm was regular.  I hear no murmurs or gallops.  ABDOMEN:  Soft without organomegaly.  EXTREMITIES:  Peripheral pulses are full.  There is no peripheral edema.   Electrocardiogram was normal.   IMPRESSION:  1. Coronary artery disease status post diaphragmatic wall infarction in      1999 treated with balloon angioplasty with recent catheterization      showing 70% stenosis of the circumflex artery, now stable.  2. Hyperlipidemia.  3. Intolerance to ASPIRIN.  4. Irritable bowel syndrome.  5. Gastroesophageal reflux disease.  6. Fibromyalgia.   RECOMMENDATIONS:  I think Donette is doing fairly well from her heart but her  secondary risk factor modification is not ideal.  I will encourage her to  continue with her current Crestor as this has made some improvement with an  LDL from 177 down to 120.  We will give her a prescription so she can resume  her Plavix since she cannot take aspirin.  Encouraged her to lose weight and  get back into a regular exercise program.  We will see her back in 6 months.   ADDENDUM:  She indicates she had some symptoms of altered mental status  where she is driving along and suddenly does not know where she is.  She has  had some intermittent confusion.  She requests a neurological appointment.  She has seen Dr. Shireen Quan in the past and requested with him and we just  learned he is in [place] and will discuss whether she wants to go there or  see the neurology group here.            ______________________________  Everardo Beals. Juanda Chance, MD, [facility]     BRB/MedQ  DD:  01/21/2006  DT:  01/22/2006  Job #:  [id number]

## 2010-08-24 NOTE — Assessment & Plan Note (Signed)
[facility]                                 ON-CALL NOTE   [name], [name]                         MRN:          [id number]  DATE:01/24/2008                            DOB:          03/17/1941    PRIMARY CARDIOLOGIST:  Everardo Beals. Juanda Chance, MD, [facility]   I was contacted by the ER staff this evening regarding [name].  She  had come in with chest pain felt to be atypical and had negative cardiac  enzymes.  The ER physician requested an outpatient followup.  I will  leave a message with the office regarding this.  She is currently  painfree and instructed to come back to the emergency room or call 911  if symptoms recur.      Theodore Demark, PA-C  Electronically Signed      Luis Abed, MD, [facility]  Electronically Signed   RB/MedQ  DD: 03/25/2008  DT: 03/26/2008  Job #: [id number]

## 2010-08-24 NOTE — H&P (Signed)
[place]  Patient:    [name], [name] Visit Number: [id number] MRN: [id number]          Service Type: MED Location: 1800 [id number] [id number] Attending Physician:  Sandi Raveling Dictated by:   Sonda Primes, M.D. [facility] Admit Date:  01/05/2001   CC:         Titus Dubin. Alwyn Ren, M.D. [facility], Jani Gravel. Juanda Chance, M.D. [facility]  Dora M. Juanda Chance, M.D. [facility]  Candy Sledge, M.D.   History and Physical  CHIEF COMPLAINT:  Headache, nausea, diarrhea, blood in the stool, abdominal pain.  HISTORY OF PRESENT ILLNESS:  The patient is a 70 year old white female with multiple medical complaints who presents to the ER for the fourth time with the complaint of nausea, inability to eat, weight loss, abdominal burning, and others.  She dates back her illness to August 2002, when she started to have diarrhea, nausea, and was not able to continue eating.  She has been getting progressively worse.  She took Imodium and Pepto-Bismol for several days for diarrhea and became constipated.  Yesterday she took Dulcolax and started to have loose stools today with blood.  Also, they were dark due to recent Pepto-Bismol.  PAST MEDICAL HISTORY:  1. Incontinence surgery in April 2002, which she says was unsuccessful, and     she has to get an additional procedure.  2. Migraine headaches.  3. Hysterectomy.  4. Coronary disease.  5. PTCA to RCA in 1999.  6. Hyperlipidemia.  7. History of diaphragmatic MI.  8. History of colitis when she was pregnant.  9. GERD. 10. Hyperlipidemia.  CURRENT MEDICATIONS:  1. Tranxene 7.5 mg b.i.d., t.i.d., q.i.d. p.r.n.  2. Vivelle-Dot 0.05 change every three days.  She has been on it for three     years.  ALLERGIES:  PHENERGAN, THORAZINE, COMPAZINE, PROTONIX, PENICILLIN, TIGAN, XANAX, ACIPHEX, ROBINUL, LEVSIN, DOXYCYCLINE, DYNABAC, ERYTHROMYCIN, GUAIFENEX, ASPIRIN, NIASPAN, PREVACID, FLAGYL.  She also has food allergies such as OATS, MILK,  CHICKEN, CAFFEINE (those are intolerances).  She states she was able to take in the past Fioricet, Carafate.  Does not recall if she has any reaction to morphine.  Tylenol does nothing for her headache.  SOCIAL HISTORY:  Does not smoke or drink alcohol.  FAMILY HISTORY:  Positive for GERD in many relatives.  Brother with many years disease.  No history of colitis in the family.  REVIEW OF SYSTEMS:  Weight loss of 30 pounds since August.  Constant nausea, indigestion, unable to eat.  Anxiety relieved with Tranxene.  Denies being depressed or denies any major stress in her life going on now.  Diarrhea alternating with constipation.  Migraine today.  Dark stools today, likely due to Pepto-Bismol.  Blood in the stool today.  She feels weak and dizzy.  There is no chest pain.  The rest is as above or negative.  PHYSICAL EXAMINATION:  VITAL SIGNS:  Temperature 97.7, blood pressure 151/87, heart rate 88, respirations 20, O2 saturation 98% on room air.  GENERAL:  She looks tired.  HEENT:  Slightly dryish oral mucosa.  Pupils are reactive.  Ears normal exam. Nose and throat normal.  NECK:  Supple.  No thyromegaly or bruit.  LUNGS:  Clear to auscultation and percussion.  HEART:  S1, S2.  No murmur, no gallop.  Slight tachycardia.  ABDOMEN:  Soft.  Sensitive in the lower one-half on both sides.  No masses felt.  RECTAL:  Deferred.  EXTREMITIES:  Lower extremities without  edema.  SKIN:  Clear.  No jaundice.  NEUROLOGIC:  Alert, oriented, cooperative.  There are no meningeal signs.  No icterus.  LABORATORY DATA:  Nothing available at this point.  ASSESSMENT AND PLAN: 1. Dehydration:  Will treat with IV fluids. 2. Abdominal pain:  Obtain GI consult.  The etiology remains unclear.  She had    a CT scan at [facility] at some time this summer.  Will obtain    the copy. 3. Weight loss of 30 pounds, inability to eat:  Will try Carafate, and try on    clear liquids. 4.  Coronary artery disease:  No chest pain.  Will rule out an MI.  Obtain    CK x 3, troponin x 3.  Obtain EKG. 5. Multiple drug/food intolerances:  Obtain laboratory work.  I wonder if she    could have celiac disease. 6. Migraine:  Will treat with IV morphine.  May try Fioricet in the future. 7. Multiple, multifactorial:  Plan as above.  Obtain TSH, B12, cortisol, and    other laboratories. 8. Nausea:  May need to try Zofran IV. 9. Gastroesophageal reflux disease:  Try Carafate q.i.d. and GI consult to    rule out peptic ulcer disease, celiac disease, etc. Dictated by:   Sonda Primes, M.D. [facility] Attending Physician:  Sandi Raveling DD:  01/05/01 TD:  01/05/01 Job: [id number]

## 2010-08-27 ENCOUNTER — Ambulatory Visit (INDEPENDENT_AMBULATORY_CARE_PROVIDER_SITE_OTHER): Payer: PRIVATE HEALTH INSURANCE | Admitting: Cardiology

## 2010-08-27 ENCOUNTER — Encounter: Payer: Self-pay | Admitting: Cardiology

## 2010-08-27 DIAGNOSIS — I371 Nonrheumatic pulmonary valve insufficiency: Secondary | ICD-10-CM | POA: Insufficient documentation

## 2010-08-27 DIAGNOSIS — E785 Hyperlipidemia, unspecified: Secondary | ICD-10-CM

## 2010-08-27 DIAGNOSIS — J984 Other disorders of lung: Secondary | ICD-10-CM

## 2010-08-27 DIAGNOSIS — I2510 Atherosclerotic heart disease of native coronary artery without angina pectoris: Secondary | ICD-10-CM

## 2010-08-27 DIAGNOSIS — E7800 Pure hypercholesterolemia, unspecified: Secondary | ICD-10-CM

## 2010-08-27 MED ORDER — ROSUVASTATIN CALCIUM 20 MG PO TABS: 20.0000 mg | ORAL_TABLET | Freq: Every day | ORAL | Status: DC

## 2010-08-27 MED ORDER — CLOPIDOGREL BISULFATE 75 MG PO TABS: 75.0000 mg | ORAL_TABLET | Freq: Every day | ORAL | Status: DC

## 2010-08-27 NOTE — Patient Instructions (Signed)
Your physician recommends that you schedule a follow-up appointment in: 1 year with Dr. Shirlee Latch Your physician has recommended you make the following change in your medication: Take Crestor 20 mg by mouth every day. Start Plavix 75 mg by mouth daily. Your physician recommends that you return for lab work in: 2 months--Lipid,Liver profile-272.0 Your physician has requested that you have an echocardiogram. Echocardiography is a painless test that uses sound waves to create images of your heart. It provides your doctor with information about the size and shape of your heart and how well your heart's chambers and valves are working. This procedure takes approximately one hour. There are no restrictions for this procedure. To be done in November 2012

## 2010-08-27 NOTE — Progress Notes (Signed)
PCP: Dr. Alwyn Ren  70 yo with history of CAD s/p inferior MI in 1999 and left heart cath in 4/07 with 70% proximal CFX stenosis but normal myoview presents for cardiology followup.  She has been seen by Dr. Juanda Chance in the past and sees me for the first time today.  She has been stable medically recently.  Her husband recently had surgery for lung cancer and is still weak with some shortness of breath.  Patient is not on aspirin as she has not tolerated even low dose aspirin due to GI intolerance.  She gets aching lower central chest pain at night when she is lying in bed periodically and about 30-60 minutes after meals.  No exertional chest pain.  She is mildly short of breath when climbing a flight of steps.  No dyspnea walking on flat ground.  No orthopnea or PND.    ECG: NSR, LAE, nonspecific T wave flattening  Labs (3/12): TSH normal, free T3 low, free T4 normal, LDL 90  PMH: 1. CAD: Inferior MI in 1999 treated with PTCA.  Left heart cath in 4/07 showed 70% proximal CFX stenosis with EF 60%.  Myoview was done post-cath and showed no ischemia or infarction.  Intolerant to even low dose ASA due to GI discomfort.  2. Echo (11/11): EF 60-65%, moderate pulmonic insufficiency. 3. Hyperlipidemia 4. GERD 5. Fibromyalgia 6. C-spine stenosis 7. Migraines 8. IBS  Allergies  Allergen Reactions  . Adderall   . Atorvastatin   . Azithromycin   . Cephalexin   . Chlorpromazine Hcl   . Ciprofloxacin     REACTION: SEVERE PAIN,DEHYDRATION,DIARRHEA  . Doxycycline   . Ezetimibe     REACTION: JOINT/MUSCLE ACHES  . Hyoscyamine Sulfate   . Lidocaine   . Metronidazole     REACTION: SORES IN MOUTH  . Penicillins   . Sulfamethoxazole     REACTION: GI upset-acid reflux  . Telithromycin   . Topiramate    SH: Married, husband has lung cancer.  3 children.  Never smoked.  Lives in [place].   FH: Father with MI.   ROS: All systems reviewed and negative except as per HPI.   Current Outpatient  Prescriptions  Medication Sig Dispense Refill  . Acetaminophen-Aspirin Buffered (EXCEDRIN BACK & BODY) 250-250 MG tablet Take 1 tablet by mouth every 4 (four) hours as needed.        . ACUVAIL 0.45 % SOLN Place 1 drop into the left eye 2 (two) times daily.       Marland Kitchen buPROPion (WELLBUTRIN XL) 300 MG 24 hr tablet Take 300 mg by mouth daily.        . calcium carbonate (TUMS - DOSED IN MG ELEMENTAL CALCIUM) 500 MG chewable tablet Chew 1 tablet by mouth daily.        . Coenzyme Q10 (COQ10) 100 MG CAPS 1 tab po qd       . DEPAKOTE SPRINKLES 125 MG capsule Take 125 mg by mouth every morning.       . diclofenac (VOLTAREN) 50 MG EC tablet Take 50 mg by mouth 2 (two) times daily.       Marland Kitchen dicyclomine (BENTYL) 20 MG tablet Take 1 tablet (20 mg total) by mouth 2 (two) times daily.  20 tablet  0  . diphenhydrAMINE (BENADRYL) 25 mg capsule Take 25 mg by mouth every 6 (six) hours as needed.        . DUREZOL 0.05 % EMUL       . famotidine (  PEPCID) 20 MG tablet Take 20 mg by mouth as needed.        Marland Kitchen LORazepam (ATIVAN) 2 MG tablet Take 2 mg by mouth every 6 (six) hours as needed.        . Melatonin 5 MG TABS Take 1 tablet by mouth at bedtime as needed.        Marland Kitchen OLANZapine (ZYPREXA) 2.5 MG tablet 1 tab po qd except Monday. Thursday and Saturday 1/2 tab       . Omega-3 Fatty Acids (FISH OIL) 1000 MG CAPS 1 tab po qd       . omeprazole (PRILOSEC OTC) 20 MG tablet Take one tablet 30 min prior to breakfast and may take 1 tablet at evening meal  60 tablet  1  . Polyethyl Glycol-Propyl Glycol (SYSTANE) 0.4-0.3 % SOLN Place 3 drops into the left eye 3 (three) times daily.        . rosuvastatin (CRESTOR) 20 MG tablet Take 1 tablet (20 mg total) by mouth daily.  30 tablet  11  . DISCONTD: CRESTOR 20 MG tablet TAKE 1 TABLET ONCE DAILY EXCEPT TAKE 1/2 TABLETDAILY ON TUESDAY AND     THURSDAY, AND SAT.  26 each  0  . clopidogrel (PLAVIX) 75 MG tablet Take 1 tablet (75 mg total) by mouth daily.  30 tablet  11  . DISCONTD:  esomeprazole (NEXIUM) 40 MG capsule Take 40 mg by mouth daily before breakfast.        . DISCONTD: ezetimibe (ZETIA) 10 MG tablet Take 10 mg by mouth daily.        Marland Kitchen DISCONTD: metFORMIN (GLUMETZA) 500 MG (MOD) 24 hr tablet Take 500 mg by mouth daily with breakfast.        . DISCONTD: omeprazole (PRILOSEC) 20 MG capsule         BP 114/76  Pulse 87  Ht 5' 2.5" (1.588 m)  Wt 163 lb 12.8 oz (74.299 kg)  BMI 29.48 kg/m2 General: NAD Neck: No JVD, no thyromegaly or thyroid nodule.  Lungs: Clear to auscultation bilaterally with normal respiratory effort. CV: Nondisplaced PMI.  Heart regular S1/S2, no S3/S4, no murmur.  No peripheral edema.  No carotid bruit.  Normal pedal pulses.  Abdomen: Soft, nontender, no hepatosplenomegaly, no distention.  Skin: Intact without lesions or rashes.  Neurologic: Alert and oriented x 3.  Psych: Normal affect. Extremities: No clubbing or cyanosis.  HEENT: Normal.

## 2010-08-27 NOTE — Assessment & Plan Note (Signed)
Moderate pulmonic insufficiency on last echo in 11/11.  I will get a repeat echo to follow this in 11/12. She does not have a loud pulmonic area murmur.

## 2010-08-27 NOTE — Assessment & Plan Note (Signed)
Patient has atypical chest pain that has been long-term and seems to be most likely to be due to GERD.  No exertional chest pain.  She has not been able to tolerate even low dose ASA due to GI upset.  Plavix is not on her medication intolerance list, and she has no history of CNS or GI bleeding.  Therefore, I will have her start Plavix in lieu of aspirin.  She will continue on statin.

## 2010-08-27 NOTE — Assessment & Plan Note (Signed)
LDL is above goal (<70) with patient taking Crestor 20 alternating with 10.  I will have her increase Crestor to 20 mg daily every day. Lipids/LFTs in 2 months.

## 2010-10-22 ENCOUNTER — Other Ambulatory Visit (INDEPENDENT_AMBULATORY_CARE_PROVIDER_SITE_OTHER): Payer: PRIVATE HEALTH INSURANCE | Admitting: *Deleted

## 2010-10-22 ENCOUNTER — Telehealth: Payer: Self-pay | Admitting: *Deleted

## 2010-10-22 ENCOUNTER — Other Ambulatory Visit: Payer: Self-pay | Admitting: Cardiology

## 2010-10-22 DIAGNOSIS — I2510 Atherosclerotic heart disease of native coronary artery without angina pectoris: Secondary | ICD-10-CM

## 2010-10-22 LAB — HEPATIC FUNCTION PANEL
AST: 19 U/L (ref 0–37)
Alkaline Phosphatase: 83 U/L (ref 39–117)
Total Bilirubin: 0.1 mg/dL — ABNORMAL LOW (ref 0.3–1.2)

## 2010-10-22 LAB — LIPID PANEL
Total CHOL/HDL Ratio: 4
VLDL: 49 mg/dL — ABNORMAL HIGH (ref 0.0–40.0)

## 2010-10-22 LAB — LDL CHOLESTEROL, DIRECT: Direct LDL: 87.5 mg/dL

## 2010-10-22 NOTE — Telephone Encounter (Signed)
Add on form faxed. Pt notified.

## 2010-10-22 NOTE — Telephone Encounter (Signed)
Add TSH 272.4

## 2010-10-22 NOTE — Telephone Encounter (Signed)
Pt left message stating that she just left cards and had labs drawn. Pt notes that she was supposed to have thyroid done due to her hot flashes. She was advised that our office should call over and add on the testing because it was not on the order. I see this was done in March with no recommended repeat date. Please advise.

## 2010-10-23 ENCOUNTER — Encounter: Payer: Self-pay | Admitting: Internal Medicine

## 2010-10-25 ENCOUNTER — Telehealth: Payer: Self-pay | Admitting: *Deleted

## 2010-10-25 DIAGNOSIS — E7800 Pure hypercholesterolemia, unspecified: Secondary | ICD-10-CM

## 2010-10-25 DIAGNOSIS — I2510 Atherosclerotic heart disease of native coronary artery without angina pectoris: Secondary | ICD-10-CM

## 2010-10-25 MED ORDER — CLOPIDOGREL BISULFATE 75 MG PO TABS: 75.0000 mg | ORAL_TABLET | Freq: Every day | ORAL | Status: DC

## 2010-10-25 MED ORDER — ROSUVASTATIN CALCIUM 40 MG PO TABS: 40.0000 mg | ORAL_TABLET | Freq: Every day | ORAL | Status: DC

## 2010-10-25 NOTE — Telephone Encounter (Signed)
Notes Recorded by Jacqlyn Krauss, RN on 10/25/2010 at 10:11 AM I discussed results with pt. Pt will increase Crestor to 40mg  daily and increase Fish Oil to 1000mg  twice a day. Pt will return for fasting lipid/liver profile 12/25/10. ------  Notes Recorded by Marca Ancona, MD on 10/22/2010 at 4:31 PM LDL better, still not at goal. If taking Crestor 20 mg daily would increase to 40 mg daily. Suggest 2000 mg fish oil daily for triglycerides and watch diet. Repeat lipids/LFTs in 2 months.

## 2010-10-29 ENCOUNTER — Other Ambulatory Visit: Payer: Managed Care, Other (non HMO) | Admitting: *Deleted

## 2010-11-08 ENCOUNTER — Other Ambulatory Visit: Payer: Self-pay | Admitting: Internal Medicine

## 2010-11-28 ENCOUNTER — Other Ambulatory Visit: Payer: Self-pay | Admitting: Internal Medicine

## 2010-11-28 ENCOUNTER — Other Ambulatory Visit: Payer: Self-pay | Admitting: *Deleted

## 2010-11-28 DIAGNOSIS — I2510 Atherosclerotic heart disease of native coronary artery without angina pectoris: Secondary | ICD-10-CM

## 2010-11-28 DIAGNOSIS — E7800 Pure hypercholesterolemia, unspecified: Secondary | ICD-10-CM

## 2010-11-28 MED ORDER — ROSUVASTATIN CALCIUM 40 MG PO TABS: 40.0000 mg | ORAL_TABLET | Freq: Every day | ORAL | Status: DC

## 2010-12-25 ENCOUNTER — Other Ambulatory Visit: Payer: PRIVATE HEALTH INSURANCE | Admitting: *Deleted

## 2010-12-27 ENCOUNTER — Emergency Department (HOSPITAL_COMMUNITY)
Admission: EM | Admit: 2010-12-27 | Discharge: 2010-12-27 | Disposition: A | Discharge status: Home / Self Care | Payer: PRIVATE HEALTH INSURANCE | Attending: Emergency Medicine | Admitting: Emergency Medicine

## 2010-12-27 ENCOUNTER — Telehealth: Payer: Self-pay | Admitting: *Deleted

## 2010-12-27 DIAGNOSIS — E785 Hyperlipidemia, unspecified: Secondary | ICD-10-CM | POA: Insufficient documentation

## 2010-12-27 DIAGNOSIS — R1032 Left lower quadrant pain: Secondary | ICD-10-CM | POA: Insufficient documentation

## 2010-12-27 DIAGNOSIS — I2510 Atherosclerotic heart disease of native coronary artery without angina pectoris: Secondary | ICD-10-CM | POA: Insufficient documentation

## 2010-12-27 DIAGNOSIS — K5732 Diverticulitis of large intestine without perforation or abscess without bleeding: Secondary | ICD-10-CM | POA: Insufficient documentation

## 2010-12-27 DIAGNOSIS — K219 Gastro-esophageal reflux disease without esophagitis: Secondary | ICD-10-CM | POA: Insufficient documentation

## 2010-12-27 DIAGNOSIS — Z79899 Other long term (current) drug therapy: Secondary | ICD-10-CM | POA: Insufficient documentation

## 2010-12-27 LAB — URINALYSIS, ROUTINE W REFLEX MICROSCOPIC
Bilirubin Urine: NEGATIVE
Glucose, UA: NEGATIVE mg/dL
Specific Gravity, Urine: 1.015 (ref 1.005–1.030)
pH: 6.5 (ref 5.0–8.0)

## 2010-12-27 LAB — DIFFERENTIAL
Basophils Relative: 0 % (ref 0–1)
Lymphocytes Relative: 23 % (ref 12–46)
Lymphs Abs: 1.8 10*3/uL (ref 0.7–4.0)
Monocytes Absolute: 1 10*3/uL (ref 0.1–1.0)
Monocytes Relative: 12 % (ref 3–12)
Neutro Abs: 5 10*3/uL (ref 1.7–7.7)

## 2010-12-27 LAB — CBC
HCT: 39.8 % (ref 36.0–46.0)
Hemoglobin: 13.7 g/dL (ref 12.0–15.0)
MCH: 32.8 pg (ref 26.0–34.0)
MCHC: 34.4 g/dL (ref 30.0–36.0)
MCV: 95.2 fL (ref 78.0–100.0)

## 2010-12-27 LAB — URINE MICROSCOPIC-ADD ON

## 2010-12-27 LAB — POCT I-STAT, CHEM 8
BUN: 9 mg/dL (ref 6–23)
Chloride: 103 mEq/L (ref 96–112)
Creatinine, Ser: 0.8 mg/dL (ref 0.50–1.10)
Sodium: 141 mEq/L (ref 135–145)
TCO2: 26 mmol/L (ref 0–100)

## 2010-12-27 NOTE — Telephone Encounter (Signed)
Pt c/o left lower abdominal/side pain with tenderness to touch. Pt notes that pain started early this AM. Pt denies any swelling, nausea, vomiting or fever. Pt advised ED for evaluation. Pt ok stating that she will go to [place].

## 2011-01-02 ENCOUNTER — Encounter: Payer: Self-pay | Admitting: Physician Assistant

## 2011-01-02 ENCOUNTER — Ambulatory Visit (INDEPENDENT_AMBULATORY_CARE_PROVIDER_SITE_OTHER): Payer: PRIVATE HEALTH INSURANCE | Admitting: Physician Assistant

## 2011-01-02 ENCOUNTER — Telehealth: Payer: Self-pay | Admitting: Internal Medicine

## 2011-01-02 VITALS — BP 122/60 | HR 80 | Ht 62.0 in | Wt 159.0 lb

## 2011-01-02 DIAGNOSIS — K5792 Diverticulitis of intestine, part unspecified, without perforation or abscess without bleeding: Secondary | ICD-10-CM

## 2011-01-02 DIAGNOSIS — K5732 Diverticulitis of large intestine without perforation or abscess without bleeding: Secondary | ICD-10-CM

## 2011-01-02 MED ORDER — HYDROCODONE-ACETAMINOPHEN 5-500 MG PO TABS: 1.0000 | ORAL_TABLET | Freq: Four times a day (QID) | ORAL | Status: DC | PRN

## 2011-01-02 MED ORDER — CEPHALEXIN 500 MG PO CAPS: 500.0000 mg | ORAL_CAPSULE | Freq: Four times a day (QID) | ORAL | Status: AC

## 2011-01-02 NOTE — Telephone Encounter (Signed)
Spoke with patient ans she went to the ED on 12/27/10 with left sided abdominal pain and was told it was diverticulitis and was given Keflex QID which she has been taking but is not feeling better. State she is still having left sided abdominal pain above the belly button that is worse when standing or moving. She is having constipation alternating with diarrhea. States yesterday she had cramps and diarrhea. She took Imodium which did help. Denies bleeding or fever.Scheduled patient with Mike Gip, PA today at 2:30 PM.

## 2011-01-02 NOTE — Progress Notes (Addendum)
Subjective:    Patient ID: [name], female    DOB: 11/25/1940, 70 y.o.   MRN: [id number]  HPI [name] is a pleasant 70 year old white female known to Dr. Lina Sar with history of IBS, diverticulosis, and hemorrhoids. She also has history of coronary artery disease is status post MI. Her last colonoscopy was done November 2010 showing a tortuous colon and moderate diverticulosis in the sigmoid, no polyps found, she did have internal hemorrhoids.  Patient comes in today with a one-week history of left lower quadrant pain radiating through to her back. Says this has been fairly constant over the past week and worse with standing walking sitting or any abrupt change in position. She says she will get sharp stabbing pains intermittently. She had not had any fever or chills, no nausea or vomiting. She has had some more frequent stools, but no overt diarrhea, no melena or hematochezia. She did go to the emergency room at [facility]  on Thursday 9/0, had labs done which were unremarkable as was urinalysis and she was treated with Keflex 500 mg 4 times daily for diverticulitis. She did not have any imaging done. She has also been taking Vicodin as needed for pain and eating very light.  Overall she feels that she is improving though her symptoms have not resolved. She says that it's worse her pain was a 7 or 8 and now she is at a 3 or 4 range.    Review of Systems  Constitutional: Negative.   HENT: Negative.   Eyes: Negative.   Respiratory: Negative.   Gastrointestinal: Positive for abdominal pain and diarrhea.  Genitourinary: Negative.   Musculoskeletal: Positive for back pain.  Skin: Negative.   Neurological: Positive for tremors.  Hematological: Negative.   Psychiatric/Behavioral: Negative.    Outpatient Prescriptions Prior to Visit  Medication Sig Dispense Refill  . acetaminophen (TYLENOL) 500 MG tablet Take 500 mg by mouth as needed. AS NEEDED ALMOST DAILY       . B Complex-C (B-COMPLEX  WITH VITAMIN C) tablet Take 1 tablet by mouth 2 (two) times daily.        Marland Kitchen buPROPion (WELLBUTRIN XL) 300 MG 24 hr tablet Take 300 mg by mouth daily.        . Coenzyme Q10 (COQ10) 100 MG CAPS 1 tab po qd       . DEPAKOTE SPRINKLES 125 MG capsule Take 125 mg by mouth every morning.       . diphenhydrAMINE (BENADRYL) 25 mg capsule Take 25 mg by mouth every 6 (six) hours as needed.        Marland Kitchen esomeprazole (NEXIUM) 40 MG capsule Take 40 mg by mouth daily.        . Famotidine (PEPCID AC PO) Take by mouth as needed.        . fish oil-omega-3 fatty acids 1000 MG capsule Take 2 capsules (2 g total) by mouth daily.      . Melatonin 5 MG TABS Take 1 tablet by mouth at bedtime as needed.        Marland Kitchen OLANZapine (ZYPREXA) 2.5 MG tablet Take 2.5 mg by mouth. As directed      . omeprazole (PRILOSEC OTC) 20 MG tablet Take one tablet 30 min prior to breakfast and may take 1 tablet at evening meal  60 tablet  1  . trimethobenzamide (TIGAN) 300 MG capsule Take 300 mg by mouth as needed.        . Acetaminophen-Aspirin Buffered (EXCEDRIN BACK &  BODY) 250-250 MG tablet Take 1 tablet by mouth every 4 (four) hours as needed.        . ACUVAIL 0.45 % SOLN Place 1 drop into the left eye 2 (two) times daily.       . Alum & Mag Hydroxide-Simeth (MAGIC MOUTHWASH) SOLN Take by mouth. GARGLE 5cc three times a day and swallow       . calcium carbonate (TUMS - DOSED IN MG ELEMENTAL CALCIUM) 500 MG chewable tablet Chew 1 tablet by mouth daily.        . clopidogrel (PLAVIX) 75 MG tablet Take 1 tablet (75 mg total) by mouth daily.  30 tablet  6  . diclofenac (VOLTAREN) 50 MG EC tablet Take 50 mg by mouth 2 (two) times daily.       . Diclofenac Sodium CR 100 MG 24 hr tablet Take 100 mg by mouth daily.        Marland Kitchen dicyclomine (BENTYL) 20 MG tablet Take 1 tablet (20 mg total) by mouth 2 (two) times daily.  20 tablet  0  . DUREZOL 0.05 % EMUL       . ezetimibe (ZETIA) 10 MG tablet Take 10 mg by mouth at bedtime.        . famotidine (PEPCID)  20 MG tablet Take 20 mg by mouth as needed.        . fluticasone (FLONASE) 50 MCG/ACT nasal spray Place 1 spray into the nose 2 (two) times daily. IN LEFT NOSTRIL AFTER USING nETI RINSE DEVICE       . LORazepam (ATIVAN) 2 MG tablet Take 2 mg by mouth every 6 (six) hours as needed.        . Melatonin 1 MG TABS Take 1 mg by mouth as needed.        . metFORMIN (GLUCOPHAGE-XR) 500 MG 24 hr tablet TAKE 1 TABLET ONCE DAILY WITH LARGEST MEAL.  30 tablet  7  . metFORMIN (GLUMETZA) 500 MG (MOD) 24 hr tablet Take 500 mg by mouth daily. 1 once daily with largest meal**LABS DUE 05/2009**       . Polyethyl Glycol-Propyl Glycol (SYSTANE) 0.4-0.3 % SOLN Place 3 drops into the left eye 3 (three) times daily.        Marland Kitchen PRILOSEC 20 MG capsule TAKE ONE CAPSULE 30 MINUTES PRIOR TO BREAKFAST AND MAY TAKE 1 CAP AT EVENING MEAL.  60 each  5  . rosuvastatin (CRESTOR) 40 MG tablet Take 1 tablet (40 mg total) by mouth daily.  30 tablet  3      Allergies  Allergen Reactions  . Adderall   . Atorvastatin   . Azithromycin   . Cephalexin   . Chlorpromazine Hcl   . Ciprofloxacin     REACTION: SEVERE PAIN,DEHYDRATION,DIARRHEA  . Doxycycline   . Ezetimibe     REACTION: JOINT/MUSCLE ACHES  . Hyoscyamine Sulfate   . Lidocaine   . Metronidazole     REACTION: SORES IN MOUTH  . Penicillins   . Sulfamethoxazole     REACTION: GI upset-acid reflux  . Telithromycin   . Topiramate      Objective:   Physical Exam Well-developed white female in no acute distress, blood pressure 122/60 pulse 80, alert and oriented x3, pleasant, HEENT; nontraumatic normocephalic EOMI PERRLA sclera anicteric Neck; supple no JVD, Cardiovascular; regular rate and rhythm with S1-S2 no murmur rub or gallop Pulmonary; clear bilaterally, Abdomen ;soft bowel sounds active, no palpable mass or hepatosplenomegaly, she is tender in the left  lower quadrant and mildly in the suprapubic region there is no guarding, no rebound, Rectal; not done, extremities  no clubbing cyanosis or edema, skin warm and dry benign, Psych; mood and affect normal an appropriate.        Assessment & Plan:  #35 70 year old female with acute diverticulitis, stable and resolving on current regimen of antibiotics. She has no worrisome signs or symptoms.  Plan; will extend her antibiotic course out through 14 days of Keflex 500 mg 4 times daily. Gradual diet advancement Patient advised to call back if her symptoms have not completely resolved when she finishes the antibiotics on that and will decide at that time if she needs imaging. Followup with Dr. Juanda Chance as needed  #2 IBS  Reviewed and agree with management. Carie Caddy. Pyrtle, M.D.  01/07/2011

## 2011-01-02 NOTE — Patient Instructions (Signed)
We sent the Keflex prescription electronically to [facility]. We did print the Hydrocodone-acetaminophen (VICODIN) 5/500 mg. Amy Esterwood PA-C did sign this and I did fax it to [employer].  If you are not completely better after finishing the Keflex please call us at [phone].

## 2011-01-02 NOTE — Telephone Encounter (Signed)
I agree with an OV

## 2011-01-04 ENCOUNTER — Ambulatory Visit (INDEPENDENT_AMBULATORY_CARE_PROVIDER_SITE_OTHER)
Admission: RE | Admit: 2011-01-04 | Discharge: 2011-01-04 | Disposition: A | Discharge status: Home / Self Care | Payer: PRIVATE HEALTH INSURANCE | Source: Ambulatory Visit | Attending: Physician Assistant | Admitting: Physician Assistant

## 2011-01-04 ENCOUNTER — Telehealth: Payer: Self-pay | Admitting: Physician Assistant

## 2011-01-04 DIAGNOSIS — R197 Diarrhea, unspecified: Secondary | ICD-10-CM

## 2011-01-04 MED ORDER — IOHEXOL 300 MG/ML  SOLN
100.0000 mL | Freq: Once | INTRAMUSCULAR | Status: AC | PRN
  Administered 2011-01-04: 100 mL via INTRAVENOUS

## 2011-01-04 NOTE — Telephone Encounter (Signed)
Telephone call 2 patient to discuss CT findings. CT of the abdomen and pelvis was done today, this shows extensive diverticulosis involving the sigmoid colon and a short segment of inflammatory change in the mid sigmoid consistent with acute diverticulitis no perforation or abscess she also has an 8 cm segment of proximal descending colon with wall thickening and hyperemia consistent with focal segmental colitis.   When patient was seen in the office on 926 she was not having any problems with diarrhea and says that she had onset of diarrhea last night. She says this started with a lot of abdominal cramping and urgency for bowel movement and inability to pass any stool and then finally had several episodes of soft to loose stools. She said she was up much of the night with these symptoms and eventually did see mucousy stool stool with" strings of blood". She has not had any fever or chills no nausea or vomiting. This morning she did have some diarrhea which is decreasing in frequency. She denies any severe abdominal pain just says she is "uncomfortable". She does feel that she can keep down fluids. She has the Flexible sigmoidoscopy home and also has Vicodin.  I suspect she had an episode of mild segmental ischemic colitis on I cannot rule out C. difficile colitis though thus far her course does not sound typical. I advised to stay on a full liquid diet, stay home and rest, continue the Keflex and use Vicodin as needed for pain. We will call and reassess her on Monday. She was advised in the interim that if she continues to have large volume diarrhea, and/or continued bleeding then she should call the doctor on call this weekend and may require admission.

## 2011-01-04 NOTE — Telephone Encounter (Signed)
Spoke with patient and she started with diarrhea, cramps, left sided abdominal pain and nausea last night. States she "stayed on the toilet all night." Also, states she is shaking a like a leaf. Denies any blood in stool. Spoke with Mike Gip, PA . CT abd, pelvis ordered r/o refactory diverticulitis. Scheduled at [facility] [address] CT at 2:30 PM today. NPO after 10:30 AM. Drink contrast at 12:30 PM and 1:30 PM. Patient aware and will go to CT to pick up her contrast since that is closer for her.

## 2011-01-07 ENCOUNTER — Telehealth: Payer: Self-pay | Admitting: *Deleted

## 2011-01-07 NOTE — Telephone Encounter (Signed)
Spoke with patient and she is resting a lot. The pain is not as bad as it was last week. She has eaten a little solid food today and thinks she has "cleaned out her colon from the dye last week.'

## 2011-01-07 NOTE — Telephone Encounter (Signed)
Message copied by Daphine Deutscher on Mon Jan 07, 2011  1:17 PM ------      Message from: [place], [place] S      Created: Fri Jan 04, 2011  3:17 PM       Please call pt Monday  For progress  report.thanks

## 2011-01-09 ENCOUNTER — Telehealth: Payer: Self-pay | Admitting: Physician Assistant

## 2011-01-09 NOTE — Telephone Encounter (Signed)
Patient states she has not had a bowel movement since her CT on 01/04/11. She will try Miralax daily or BID as needed.

## 2011-01-10 LAB — BASIC METABOLIC PANEL
Calcium: 9.1 mg/dL (ref 8.4–10.5)
GFR calc Af Amer: >60 mL/min (ref >60)
GFR calc non Af Amer: >60 mL/min (ref >60)
Sodium: 133 mEq/L — ABNORMAL LOW (ref 135–145)

## 2011-01-10 LAB — DIFFERENTIAL
Basophils Absolute: 0 10*3/uL (ref 0.0–0.1)
Lymphocytes Relative: 26 % (ref 12–46)
Monocytes Absolute: 0.9 10*3/uL (ref 0.1–1.0)
Monocytes Relative: 11 % (ref 3–12)
Neutro Abs: 5.1 10*3/uL (ref 1.7–7.7)
Neutrophils Relative %: 62 % (ref 43–77)

## 2011-01-10 LAB — PROTIME-INR: INR: 0.9 (ref 0.00–1.49)

## 2011-01-10 LAB — POCT CARDIAC MARKERS
Troponin i, poc: <0.05 ng/mL (ref 0.00–0.09)
Troponin i, poc: <0.05 ng/mL (ref 0.00–0.09)

## 2011-01-10 LAB — CBC
Hemoglobin: 13.9 g/dL (ref 12.0–15.0)
RBC: 4.17 MIL/uL (ref 3.87–5.11)

## 2011-01-10 LAB — APTT: aPTT: 29 seconds (ref 24–37)

## 2011-01-10 NOTE — Telephone Encounter (Signed)
Ok

## 2011-02-18 ENCOUNTER — Ambulatory Visit (HOSPITAL_COMMUNITY): Payer: PRIVATE HEALTH INSURANCE | Attending: Cardiology | Admitting: Radiology

## 2011-02-18 DIAGNOSIS — I252 Old myocardial infarction: Secondary | ICD-10-CM | POA: Insufficient documentation

## 2011-02-18 DIAGNOSIS — E7800 Pure hypercholesterolemia, unspecified: Secondary | ICD-10-CM

## 2011-02-18 DIAGNOSIS — I379 Nonrheumatic pulmonary valve disorder, unspecified: Secondary | ICD-10-CM | POA: Insufficient documentation

## 2011-02-18 DIAGNOSIS — I079 Rheumatic tricuspid valve disease, unspecified: Secondary | ICD-10-CM | POA: Insufficient documentation

## 2011-02-18 DIAGNOSIS — E785 Hyperlipidemia, unspecified: Secondary | ICD-10-CM | POA: Insufficient documentation

## 2011-02-18 DIAGNOSIS — R011 Cardiac murmur, unspecified: Secondary | ICD-10-CM | POA: Insufficient documentation

## 2011-02-18 DIAGNOSIS — I059 Rheumatic mitral valve disease, unspecified: Secondary | ICD-10-CM | POA: Insufficient documentation

## 2011-03-04 ENCOUNTER — Encounter: Payer: Self-pay | Admitting: Internal Medicine

## 2011-03-04 ENCOUNTER — Ambulatory Visit (INDEPENDENT_AMBULATORY_CARE_PROVIDER_SITE_OTHER): Payer: Self-pay | Admitting: Internal Medicine

## 2011-03-04 DIAGNOSIS — H109 Unspecified conjunctivitis: Secondary | ICD-10-CM

## 2011-03-04 DIAGNOSIS — J321 Chronic frontal sinusitis: Secondary | ICD-10-CM

## 2011-03-04 LAB — CBC WITH DIFFERENTIAL/PLATELET
Basophils Absolute: 0 10*3/uL (ref 0.0–0.1)
Basophils Relative: 0.2 % (ref 0.0–3.0)
Eosinophils Relative: 0.7 % (ref 0.0–5.0)
HCT: 39.8 % (ref 36.0–46.0)
Hemoglobin: 13.6 g/dL (ref 12.0–15.0)
Lymphocytes Relative: 28.5 % (ref 12.0–46.0)
Lymphs Abs: 2.6 10*3/uL (ref 0.7–4.0)
Monocytes Relative: 10.1 % (ref 3.0–12.0)
Neutro Abs: 5.4 10*3/uL (ref 1.4–7.7)
RBC: 4.01 Mil/uL (ref 3.87–5.11)
RDW: 13.5 % (ref 11.5–14.6)

## 2011-03-04 MED ORDER — CEFUROXIME AXETIL 500 MG PO TABS: 500.0000 mg | ORAL_TABLET | Freq: Two times a day (BID) | ORAL | Status: AC

## 2011-03-04 NOTE — Patient Instructions (Signed)
Plain Mucinex for thick secretions ;force NON dairy fluids. Use a Neti pot daily as needed for sinus congestion  

## 2011-03-04 NOTE — Progress Notes (Signed)
  Subjective:    Patient ID: [name], female    DOB: [birth year], 70 y.o.   MRN: [id number]  HPI Eye Problem: Onset/symptoms:discomfort since cataract surgery 1 year ago Progression of symptoms:eryhthema & edema with crusting Treatments/response: She has been treated by Dr. Randon Goldsmith group ( Dr. Jimmey Ralph) and has also seen  Dr. Dione Booze. The ophthalmologist question relationship to possible sinus issues. Present symptoms: Fever/chills/sweats:yes Frontal headache:L frontal  Facial pain:no Nasal purulence:yes, green after use of Neti pot Sore throat:no Dental pain:no Lymphadenopathy:no Cough/sputum; scant white to yellow Associated extrinsic/allergic symptoms:itchy eyes/ sneezing:frequent sneezing in past 6 months Past medical history: Seasonal allergies; no but perennial allergies/asthma:no Smoking history:never           Review of Systems  Her dermatologist has prescribed Keflex without ill effect.     Objective:   Physical Exam General appearance is of good health and nourishment; no acute distress or increased work of breathing is present.  No  lymphadenopathy about the head, neck, or axilla noted.   Eyes: Conjunctival inflammation w/o  lid edema is present. There is mild edema of the left upper lid. There is marked tearing of the eye. Extraocular motion is intact, but with accommodation there is slight drift of the right eye to the right. There is no scleral icterus. She is able to read the wall chart at 6 feet without lenses. Ears:  External ear exam shows no significant lesions or deformities.  Otoscopic examination reveals clear canals, tympanic membranes are intact bilaterally without bulging, retraction, inflammation or discharge.  Nose:  External nasal examination shows no deformity or inflammation. Nasal mucosa are pink and moist without lesions or exudates. No septal dislocation .No obstruction to airflow.   Oral exam: Dental hygiene is good; lips and gums are healthy  appearing.There is no oropharyngeal erythema or exudate noted. Osteoma of hard palate  Neck:  No deformities, thyromegaly, masses, or tenderness noted.   Supple with full range of motion without pain.   Heart:  Normal rate and regular rhythm. S1 and S2 normal without gallop,  click, rub or other extra sounds.  Grade 1/6 systolic murmur  Lungs:Chest clear to auscultation; no wheezes, rhonchi,rales ,or rubs present.No increased work of breathing.    Extremities:  No cyanosis, edema, or clubbing  noted   Neuro: oriented X 3; slight tremor L hand   Skin: Warm & dry w/o jaundice          Assessment & Plan:  #1 protracted conjunctivitis following cataract surgery; possible concomitant sinusitis was questioned  #2 history as noted may suggest left frontal sinusitis  #3 history of multiple drug allergies; she has been tolerant of cephalexin. This should allow employment cefuroxime for possible sinusitis.

## 2011-03-05 ENCOUNTER — Ambulatory Visit (INDEPENDENT_AMBULATORY_CARE_PROVIDER_SITE_OTHER)
Admission: RE | Admit: 2011-03-05 | Discharge: 2011-03-05 | Disposition: A | Discharge status: Home / Self Care | Payer: PRIVATE HEALTH INSURANCE | Source: Ambulatory Visit | Attending: Internal Medicine | Admitting: Internal Medicine

## 2011-03-05 DIAGNOSIS — J321 Chronic frontal sinusitis: Secondary | ICD-10-CM

## 2011-03-05 DIAGNOSIS — H109 Unspecified conjunctivitis: Secondary | ICD-10-CM

## 2011-03-07 ENCOUNTER — Other Ambulatory Visit: Payer: Self-pay

## 2011-03-25 ENCOUNTER — Telehealth: Payer: Self-pay

## 2011-03-25 ENCOUNTER — Ambulatory Visit (INDEPENDENT_AMBULATORY_CARE_PROVIDER_SITE_OTHER): Payer: PRIVATE HEALTH INSURANCE | Admitting: Internal Medicine

## 2011-03-25 DIAGNOSIS — J069 Acute upper respiratory infection, unspecified: Secondary | ICD-10-CM

## 2011-03-25 DIAGNOSIS — J209 Acute bronchitis, unspecified: Secondary | ICD-10-CM

## 2011-03-25 MED ORDER — BUDESONIDE-FORMOTEROL FUMARATE 160-4.5 MCG/ACT IN AERO: 2.0000 | INHALATION_SPRAY | Freq: Two times a day (BID) | RESPIRATORY_TRACT | Status: DC

## 2011-03-25 NOTE — Patient Instructions (Signed)
Symbicort one inhalation every 12 hours; gargle and spit after use .

## 2011-03-25 NOTE — Telephone Encounter (Signed)
[facility] Triage Call Report Triage Record Num: [id number] Operator: Griselda Miner Patient Name: [name] Call Date & Time: 03/23/2011 3:40:56PM Patient Phone: [phone] PCP: Marga Melnick Patient Gender: Female PCP Fax : [phone] Patient DOB: 01/19/1941 Practice Name: [facility] Reason for Call: Caller: [place]/Patient; PCP: Marga Melnick; CB#: [phone]; Call regarding Cough/Congestion; Afebrile, feels like having "weird noises" from chest-sounds like water mixing with bubbles and or like angry kittens., denies chest pain, eating and drinking well. Has been lying around feeling bad. Missed appt to see provider yesterday. State congestion is deep and is coughing up a little yellow white mucus. Is taking mucinex.Is on Doxycycline for eye infection. Denies emergent symptoms. Guideline used was cough adult with dispositon to see provider in 24 hours.RN encouraged patient with the chest congestion and sound discriptions to go be seen--patients says wants to see her regular provider on Monday. Protocol(s) Used: Cough - Adult Recommended Outcome per Protocol: See Provider within 24 hours Reason for Outcome: Productive cough with colored sputum (other than clear or white sputum) Care Advice: ~ Use a cool mist humidifier to moisten air. Be sure to clean according to manufacturer's instructions. Limit or avoid exposure to irritants and allergens (e.g. air pollution, smoke/smoking, chemicals, dust, pollen, pet dander, etc.) ~ Call provider if fever greater than 101.5 F (38.6 C) or 100.5 F (38.1C) in an immunocompromised patient (such as diabetes, HIV/AIDS, renal disease, chemotherapy, organ transplant, or chronic steroid use) has not improved in 24 hours. ~ Increase fluids to 8-12 eight oz (1.6 to 2.4 liters) glasses per day, half of them to be water. Soups, popsicles, fruit juices, non-caffeinated sodas (unless restricting sodium intake), jello, broths, decaf  teas, etc. are all okay. Warm fluids can be soothing. ~ ~ HEALTH PROMOTION / MAINTENANCE ~ SYMPTOM / CONDITION MANAGEMENT ~ CAUTIONS Coughing up mucus or phlegm helps to get rid of an infection. A productive cough should not be stopped. A cough medicine with guaifenesin (Robitussin, Mucinex) can help loosen the mucus. Cough medicine with dextromethorphan (DM) should be avoided. Drinking lots of fluids can help loosen the mucus too, especially warm fluids. ~ 03/23/2011 4:10:49PM Page 1 of 1 CAN_[email or web address]_  Patient was seen today

## 2011-03-25 NOTE — Progress Notes (Signed)
  Subjective:    Patient ID: [name], female    DOB: 01-Jan-1941, 70 y.o.   MRN: [id number]  HPI  Respiratory Tract Infection: Onset of exacerbation:12/13 as nasal congestion & frontal headache Triggers (environmental, infectious, allergic):no other than chronic eye infection Severity/impact:wheezing & coughing "all day long" Rescue inhaler use:no Maintenance medications/ response:no Nocturnal dyspnea:some Associated symptoms: Allergic:No itchy eyes, sneezing, postnasal drainage,  angioedema Constitution:No fever, chills, sweats ENT: No facial pain,  sore throat, dental pain. Purulence nasally Cardiovascular: No palpitations, edema, calf swelling/ pain Respiratory: Yellow sputum. No pleuritic chest pain GI: Minor  Heartburn/dyspepsia,no dysphagia Derm: No rash, urticaria/hives Heme/lymph: No lymphadenopathy  Past medical history: Seasonal allergies :yes ; age of onset of asthma:no Smoking history:second hand smoke   Review of Systems     Objective:   Physical Exam General appearance is of good health and nourishment; no acute distress or increased work of breathing is present.  No  lymphadenopathy about the head, neck, or axilla noted.   Eyes: No conjunctival inflammation or lid edema is present. .  Ears:  External ear exam shows no significant lesions or deformities.  Otoscopic examination reveals clear canals, tympanic membranes are intact bilaterally without bulging, retraction, inflammation or discharge.  Nose:  External nasal examination shows no deformity or inflammation. Nasal mucosa are pink and moist without lesions or exudates. No septal dislocation .No obstruction to airflow.   Oral exam: Dental hygiene is good; lips and gums are healthy appearing.There is no oropharyngeal erythema or exudate noted. Slightly hoarse    Heart:  Normal rate and regular rhythm. S1 and S2 normal without gallop, murmur, click, rub or other extra sounds.   Lungs:Chest clear to  auscultation; no wheezes, rhonchi,rales ,or rubs present.No increased work of breathing.  Dry cough  Extremities:  No cyanosis, edema, or clubbing  noted    Skin: Warm & dry           Assessment & Plan:  #1 bronchitis, subjectively she noted wheezing. Clinically no reactive airways is present. By history she has done well with inhaled Symbicort.  #2 upper respiratory infection, rule out rhinosinusitis  #3 multiple drug intolerances which limit therapeutic options  Plan: See orders and recommendations.

## 2011-04-26 ENCOUNTER — Other Ambulatory Visit: Payer: Self-pay | Admitting: Internal Medicine

## 2011-05-22 ENCOUNTER — Telehealth: Payer: Self-pay | Admitting: Internal Medicine

## 2011-05-22 NOTE — Telephone Encounter (Signed)
Before she sees Amy, please put on bowl rwst, full liquids  And obtain CBC with diff

## 2011-05-22 NOTE — Telephone Encounter (Signed)
Patient calling to report diarrhea alternating with constipation for last couple weeks. Today, she started to have left sided abdominal pain and bloating.She is worried she is having a diverticulitis flare. Denies fever, nausea or vomiting. Hx diverticulitis, IBS. Scheduled to see Mike Gip, PA on 05/23/11 at 1:30 PM.

## 2011-05-23 ENCOUNTER — Encounter: Payer: Self-pay | Admitting: Physician Assistant

## 2011-05-23 ENCOUNTER — Other Ambulatory Visit (INDEPENDENT_AMBULATORY_CARE_PROVIDER_SITE_OTHER): Payer: PRIVATE HEALTH INSURANCE

## 2011-05-23 ENCOUNTER — Ambulatory Visit (INDEPENDENT_AMBULATORY_CARE_PROVIDER_SITE_OTHER): Payer: PRIVATE HEALTH INSURANCE | Admitting: Physician Assistant

## 2011-05-23 DIAGNOSIS — R109 Unspecified abdominal pain: Secondary | ICD-10-CM

## 2011-05-23 DIAGNOSIS — K5900 Constipation, unspecified: Secondary | ICD-10-CM

## 2011-05-23 DIAGNOSIS — R197 Diarrhea, unspecified: Secondary | ICD-10-CM

## 2011-05-23 LAB — CBC WITH DIFFERENTIAL/PLATELET
Basophils Absolute: 0 10*3/uL (ref 0.0–0.1)
Eosinophils Absolute: 0.1 10*3/uL (ref 0.0–0.7)
HCT: 39.2 % (ref 36.0–46.0)
Hemoglobin: 13.2 g/dL (ref 12.0–15.0)
Lymphs Abs: 1.9 10*3/uL (ref 0.7–4.0)
MCHC: 33.8 g/dL (ref 30.0–36.0)
MCV: 99.7 fl (ref 78.0–100.0)
Monocytes Absolute: 0.7 10*3/uL (ref 0.1–1.0)
Neutro Abs: 3.3 10*3/uL (ref 1.4–7.7)
Platelets: 181 10*3/uL (ref 150.0–400.0)
RDW: 12.6 % (ref 11.5–14.6)

## 2011-05-23 MED ORDER — DICYCLOMINE HCL 10 MG PO CAPS: 10.0000 mg | ORAL_CAPSULE | Freq: Two times a day (BID) | ORAL | Status: DC

## 2011-05-23 MED ORDER — ALIGN PO CAPS: 1.0000 | ORAL_CAPSULE | Freq: Every day | ORAL | Status: DC

## 2011-05-23 NOTE — Telephone Encounter (Signed)
Spoke with patient and gave her Dr. Regino Schultze recommendations. She will have lab prior to OV.

## 2011-05-23 NOTE — Progress Notes (Signed)
Subjective:    Patient ID: [name], female    DOB: 25-Jul-1940, 71 y.o.   MRN: [id number]  HPI [name] is a pleasant 71 year old female known to Dr. Lina Walls. She does have history of IBS diverticulosis and diverticulitis. She has history of coronary artery disease and is status post MI. She last underwent colonoscopy in November of 2010 at that time with finding of moderate diverticulosis in the left colon and a tortuous sigmoid. She was last seen here in September of 2012 at which time she had an acute episode of diverticulitis which was documented on CT scan. She was also felt to have a segment of segmental colitis in the proximal descending colon. She was treated with a long course of Keflex and her symptoms resolved.  She comes in today with complaints of lower abdominal pain and diarrhea intermittently over the past 2 weeks. She says she is having episodes of 2-3 days worth of loose stools with 4-5 bowel movements per day and then may have a day where she doesn't have any bowel movement or has small round formed  stools. On the days that she is having diarrhea she usually has pain ,on the other days feels better. She has not had any documented fever though yesterday says she had a temp of 99 8 at home. Her appetite has been decreased and she's been somewhat nauseated especially in the mornings. 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 She is being seen by an ophthalmologist Dr. Delaney Walls who has treated her with 3 courses of doxycycline. She finished her last course of doxycycline several days ago and says that her I has started flaring up again over the past couple of days. She also feels that she's having some ongoing problems with sinusitis    Review of Systems  Constitutional: Positive for appetite change.  HENT: Positive for congestion.   Eyes: Positive for discharge.  Respiratory: Negative.     Cardiovascular: Negative.   Gastrointestinal: Positive for abdominal pain and diarrhea.  Genitourinary: Negative.   Musculoskeletal: Negative.   Hematological: Negative.   Psychiatric/Behavioral: Negative.    Outpatient Prescriptions Prior to Visit  Medication Sig Dispense Refill  . Alum Hydroxide-Mag Carbonate (GAVISCON PO) Take by mouth as needed.        . Coenzyme Q10 (COQ10) 100 MG CAPS 1 tab po qd       . DEPAKOTE SPRINKLES 125 MG capsule Take 125 mg by mouth every morning.       . Famotidine (PEPCID AC PO) Take by mouth as needed.        . fish oil-omega-3 fatty acids 1000 MG capsule Take 2 capsules (2 g total) by mouth daily.      . Melatonin 5 MG TABS Take 1 tablet by mouth at bedtime as needed.        Marland Kitchen NEXIUM 40 MG capsule TAKE (1) CAPSULE DAILY.  30 each  5  . OLANZapine (ZYPREXA) 2.5 MG tablet Take 2.5 mg by mouth. As directed      . rosuvastatin (CRESTOR) 20 MG tablet Take 40 mg by mouth daily.       Marland Kitchen acetaminophen (TYLENOL) 500 MG tablet Take 500 mg by mouth as needed. AS NEEDED ALMOST DAILY       . B Complex-C (B-COMPLEX WITH VITAMIN C) tablet Take 1 tablet by mouth 2 (two) times daily.        Marland Kitchen  budesonide-formoterol (SYMBICORT) 160-4.5 MCG/ACT inhaler Inhale 2 puffs into the lungs 2 (two) times daily.  1 Inhaler  3  . buPROPion (WELLBUTRIN XL) 300 MG 24 hr tablet Take 300 mg by mouth daily.        . diphenhydrAMINE (BENADRYL) 25 mg capsule Take 25 mg by mouth every 6 (six) hours as needed.         Allergies  Allergen Reactions  . Amphetamine-Dextroamphet Er   . Atorvastatin   . Azithromycin   . Cephalexin   . Chlorpromazine Hcl   . Ciprofloxacin     REACTION: SEVERE PAIN,DEHYDRATION,DIARRHEA  . Doxycycline   . Ezetimibe     REACTION: JOINT/MUSCLE ACHES  . Hyoscyamine Sulfate   . Lidocaine   . Metronidazole     REACTION: SORES IN MOUTH  . Penicillins   . Sulfamethoxazole     REACTION: GI upset-acid reflux  . Telithromycin   . Topiramate    Active  Ambulatory Problems    Diagnosis Date Noted  . LIPOMAS, MULTIPLE 09/26/2008  . UNSPECIFIED VITAMIN D DEFICIENCY 01/16/2010  . HYPERLIPIDEMIA-MIXED 07/11/2008  . DEHYDRATION 03/12/2007  . ANXIETY STATE, UNSPECIFIED 07/14/2008  . Unspecified Migraine without Mention of Intractable Migraine 03/12/2007  . ACUT MI ANTEROLAT WALL SUBSQT EPIS CARE 03/12/2007  . CAD 07/10/2007  . CARDIOMEGALY, MILD 02/01/2010  . OTHER SPECIFIED CIRCULATORY SYSTEM DISORDERS 08/14/2009  . ALLERGIC RHINITIS 03/12/2007  . 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 GERD 08/26/2007  . DIVERTICULOSIS 11/25/2006  . CONSTIPATION 03/12/2007  . IRRITABLE BOWEL SYNDROME 08/27/2007  . FIBROCYSTIC BREAST DISEASE 06/08/2008  . SKIN LESION, BENIGN 08/14/2009  . LEG PAIN 01/30/2009  . HEADACHE 07/10/2007  . ABDOMINAL BRUIT 03/12/2007  . HYPERGLYCEMIA, FASTING 05/19/2009  . CONTUSION, LOWER LEG, RIGHT 11/16/2008  . UNS ADVRS EFF UNS RX MEDICINAL&BIOLOGICAL SBSTNC 03/12/2007  . TREMOR, ESSENTIAL 06/20/2010  . HOT FLASHES 06/20/2010  . Pulmonic insufficiency 08/27/2010   Resolved Ambulatory Problems    Diagnosis Date Noted  . WEIGHT GAIN 03/12/2007  . Diarrhea 03/12/2007   Past Medical History  Diagnosis Date  . Abdominal bruit   . Diverticular disease   . CAD (coronary artery disease)   . Hyperlipemia   . IBS (irritable bowel syndrome)   . GERD (gastroesophageal reflux disease)   . Fibromyalgia   . Aspirin allergy   . Diabetes mellitus        Objective:   Physical Exam well-developed white female in no acute distress, pleasant. ; nontraumatic normocephalic EOMI PERRLA she does have erythema and some edema of the conjunctiva of the left eye including the base of the eyelashes with minimal amount of exudate. ;supple no JVD, Cardiovascular; regular rate and rhythm with S1-S2 no murmur gallop, Pulmonary; clear bilaterally, Abdomen ;soft bowel sounds are present she  has tenderness bilaterally in the lower quadrants no guarding  no rebound no palpable masses or hepatosplenomegaly, Rectal not done, Extremities no clubbing cyanosis or edema, Psych;l affect normal and appropriate        Assessment & Plan:  #71 year old female with history of IBS and diverticulosis with 2 week history of intermittent abdominal pain and diarrhea in the setting of prolonged antibiotic use (doxycycline) for an eye infection. Do not feel clinically at this time that she has diverticulitis though cannot  absolutely rule out out, I suspect she is having an exacerbation of IBS possibly exacerbated by the antibiotics. Will also rule out C. difficile. Plan; stool for lactoferrinand and C. difficile PCR Start Align   1  by mouth daily x30 days Start trial of Bentyl 10 mg 2-3 times daily as needed and needed for cramping and diarrhea Advised patient to see either her ophthalmologist or were primary care tomorrow for further treatment of her recurrent eye infection and possible sinusitis. Have asked her to call us back early next week with a progress report.

## 2011-05-23 NOTE — Patient Instructions (Signed)
Amy would like you to go down to the basement to get some lab work done today.  We have sent the following medications to your pharmacy for you to pick up: Bentyl and Align; take both as prescribed.  Amy would like to see you back in the office early next month for a follow up

## 2011-05-23 NOTE — Telephone Encounter (Signed)
Left a message for patient to call me. 

## 2011-05-24 ENCOUNTER — Telehealth: Payer: Self-pay | Admitting: *Deleted

## 2011-05-24 NOTE — Progress Notes (Signed)
Agree with initial assessment and plans 

## 2011-05-24 NOTE — Telephone Encounter (Signed)
Message copied by Daphine Deutscher on Fri May 24, 2011  8:52 AM ------      Message from: Hart Carwin      Created: Thu May 23, 2011 10:18 PM       please call pt with normal CBC

## 2011-05-24 NOTE — Telephone Encounter (Signed)
Spoke with patient and gave her results as per Dr. Brodie. 

## 2011-05-27 ENCOUNTER — Telehealth: Payer: Self-pay | Admitting: Cardiology

## 2011-05-27 ENCOUNTER — Telehealth: Payer: Self-pay | Admitting: Internal Medicine

## 2011-05-27 DIAGNOSIS — E7800 Pure hypercholesterolemia, unspecified: Secondary | ICD-10-CM

## 2011-05-27 DIAGNOSIS — I2510 Atherosclerotic heart disease of native coronary artery without angina pectoris: Secondary | ICD-10-CM

## 2011-05-27 NOTE — Telephone Encounter (Signed)
Spoke with patient and gave her last lipid results and BP on 05/23/11.

## 2011-05-27 NOTE — Telephone Encounter (Signed)
New problem:  Patient calling, filling out policy for insurance the question regarding  angio plasty & valve leakage.

## 2011-05-27 NOTE — Telephone Encounter (Signed)
Talked with pt. Pt asking about information from records from 1999. Referred pt to HIM. Pt requesting appt for liver profile. Pt states Dr Dareen Piano has requested  a liver profile every 6 months. Pt was due for fasting lipid liver profile in Sept 2012. I will make appt for pt for fasting lipid /liver profile this Friday Feb 22.

## 2011-05-29 ENCOUNTER — Other Ambulatory Visit: Payer: PRIVATE HEALTH INSURANCE

## 2011-05-29 DIAGNOSIS — R197 Diarrhea, unspecified: Secondary | ICD-10-CM

## 2011-05-31 ENCOUNTER — Other Ambulatory Visit: Payer: PRIVATE HEALTH INSURANCE

## 2011-05-31 ENCOUNTER — Other Ambulatory Visit (INDEPENDENT_AMBULATORY_CARE_PROVIDER_SITE_OTHER): Payer: PRIVATE HEALTH INSURANCE

## 2011-05-31 DIAGNOSIS — E7800 Pure hypercholesterolemia, unspecified: Secondary | ICD-10-CM

## 2011-05-31 DIAGNOSIS — I2510 Atherosclerotic heart disease of native coronary artery without angina pectoris: Secondary | ICD-10-CM

## 2011-05-31 LAB — LIPID PANEL: Total CHOL/HDL Ratio: 4

## 2011-05-31 LAB — LDL CHOLESTEROL, DIRECT: Direct LDL: 83.9 mg/dL

## 2011-05-31 LAB — HEPATIC FUNCTION PANEL
AST: 24 U/L (ref 0–37)
Total Bilirubin: 0.5 mg/dL (ref 0.3–1.2)

## 2011-05-31 LAB — CLOSTRIDIUM DIFFICILE BY PCR: Toxigenic C. Difficile by PCR: NOT DETECTED

## 2011-06-06 ENCOUNTER — Other Ambulatory Visit: Payer: Self-pay | Admitting: Dermatology

## 2011-06-06 ENCOUNTER — Telehealth: Payer: Self-pay | Admitting: *Deleted

## 2011-06-06 MED ORDER — ROSUVASTATIN CALCIUM 20 MG PO TABS: 20.0000 mg | ORAL_TABLET | Freq: Every day | ORAL | Status: DC

## 2011-06-06 NOTE — Telephone Encounter (Signed)
Notes Recorded by Jacqlyn Krauss, RN on 06/06/2011 at 5:50 PM Discussed with pt. She agreed to take fish oil 2000mg  daily. ------  Notes Recorded by Jacqlyn Krauss, RN on 06/06/2011 at 11:05 AM NA ------  Notes Recorded by Jacqlyn Krauss, RN on 06/05/2011 at 2:29 PM [facility] ------  Notes Recorded by Marca Ancona, MD on 06/02/2011 at 11:24 PM TGs high, would take fish oil 2000 mg daily. LDL is acceptable.

## 2011-07-15 ENCOUNTER — Encounter: Payer: Self-pay | Admitting: Internal Medicine

## 2011-07-15 ENCOUNTER — Ambulatory Visit (INDEPENDENT_AMBULATORY_CARE_PROVIDER_SITE_OTHER): Payer: PRIVATE HEALTH INSURANCE | Admitting: Internal Medicine

## 2011-07-15 VITALS — BP 126/78 | HR 80 | Temp 98.8°F | Resp 14 | Wt 160.0 lb

## 2011-07-15 DIAGNOSIS — H109 Unspecified conjunctivitis: Secondary | ICD-10-CM

## 2011-07-15 DIAGNOSIS — J209 Acute bronchitis, unspecified: Secondary | ICD-10-CM

## 2011-07-15 DIAGNOSIS — J019 Acute sinusitis, unspecified: Secondary | ICD-10-CM

## 2011-07-15 MED ORDER — CLARITHROMYCIN ER 500 MG PO TB24: 1000.0000 mg | ORAL_TABLET | Freq: Every day | ORAL | Status: AC

## 2011-07-15 NOTE — Progress Notes (Signed)
  Subjective:    Patient ID: [name], female    DOB: Feb 20, 1941, 71 y.o.   MRN: [id number]  HPI Respiratory tract infection Onset/symptoms:07/10/11 as head congestion & ST Exposures (illness/environmental/extrinsic):husband ill Progression of symptoms:to ear pressure & eye redness Treatments/response:Neti pot , Tylenol, Robitussin DM , Mucinex with partial response Present symptoms: Fever/chills/sweats:temp to 101 on 4/7 Frontal headache:yes Facial pain:yes Nasal purulence:green Dental pain:some Lymphadenopathy:bilaterally Wheezing/shortness of breath:no Cough/sputum/hemoptysis:green sputum            Review of Systems She describes some scant green from the left eye with crusting. She's had blurred vision but no double vision or frank loss of vision.  She's been treated by her ophthalmologist with 3 rounds of doxycycline without complete resolution of the "follicular conjunctivitis".  She received "mycin" eyedrops as well for 2 weeks. She does have some mycin drops left. She has a followup ophthalmologic visit in one month      Objective:   Physical Exam General appearance:good health ;well nourished; no acute distress or increased work of breathing is present.  No  lymphadenopathy about the head, neck, or axilla noted.   Eyes: OS  conjunctival inflammation  is present. There is mild erythema and ptosis of the left upper. Extraocular motion is intact as is vision.  Ears:  External ear exam shows no significant lesions or deformities.  Otoscopic examination reveals clear canals, tympanic membranes are intact bilaterally without bulging, retraction, inflammation or discharge.  Nose:  External nasal examination shows no deformity or inflammation. Nasal mucosa are dry without lesions or exudates. No septal dislocation or deviation.No obstruction to airflow.   Oral exam: Dental hygiene is good; lips and gums are healthy appearing.There is no oropharyngeal erythema or exudate  noted.   Neck:  No deformities, masses, or tenderness noted.   Supple with full range of motion without pain.   Heart:  Normal rate and regular rhythm. S1 and S2 normal without gallop, murmur, click, rub.S4 with slurring at  LSB  Lungs:Chest clear to auscultation; no wheezes, rhonchi,rales ,or rubs present.No increased work of breathing.    Extremities:  No cyanosis, edema, or clubbing  noted    Skin: Warm & dry           Assessment & Plan:  #1 upper respiratory infection with pharyngitis, rhinosinusitis #2 conjunctivitis # 3 bronchitis , acute w/o bronchospasm  #4 probable true allergic reactions with metronidazole and penicillin. "Reactions" to their agents. The more GI intolerance.  Plan: See orders and recommendations

## 2011-07-15 NOTE — Patient Instructions (Addendum)
Plain Mucinex for thick secretions ;force NON dairy fluids . Use a Neti pot daily as needed for sinus congestion .Nasal cleansing in the shower as discussed. Make sure that all residual soap is removed to prevent irritation.  Please try to go on My Chart within the next 24 hours to allow me to release the results directly to you.

## 2011-07-17 ENCOUNTER — Ambulatory Visit (INDEPENDENT_AMBULATORY_CARE_PROVIDER_SITE_OTHER)
Admission: RE | Admit: 2011-07-17 | Discharge: 2011-07-17 | Disposition: A | Discharge status: Home / Self Care | Payer: PRIVATE HEALTH INSURANCE | Source: Ambulatory Visit | Attending: Internal Medicine | Admitting: Internal Medicine

## 2011-07-17 DIAGNOSIS — H109 Unspecified conjunctivitis: Secondary | ICD-10-CM

## 2011-07-17 DIAGNOSIS — J019 Acute sinusitis, unspecified: Secondary | ICD-10-CM

## 2011-08-01 ENCOUNTER — Encounter: Payer: Self-pay | Admitting: Internal Medicine

## 2011-08-01 ENCOUNTER — Ambulatory Visit (INDEPENDENT_AMBULATORY_CARE_PROVIDER_SITE_OTHER): Payer: PRIVATE HEALTH INSURANCE | Admitting: Internal Medicine

## 2011-08-01 VITALS — BP 120/80 | HR 73 | Temp 98.7°F | Wt 160.8 lb

## 2011-08-01 DIAGNOSIS — H6980 Other specified disorders of Eustachian tube, unspecified ear: Secondary | ICD-10-CM

## 2011-08-01 DIAGNOSIS — J309 Allergic rhinitis, unspecified: Secondary | ICD-10-CM

## 2011-08-01 MED ORDER — FLUTICASONE PROPIONATE 50 MCG/ACT NA SUSP: 1.0000 | Freq: Two times a day (BID) | NASAL | Status: DC | PRN

## 2011-08-01 NOTE — Progress Notes (Signed)
  Subjective:    Patient ID: [name], female    DOB: [birth year], 71 y.o.   MRN: [id number]  HPI onset of symptoms: 4-5 days ago with sinus congestion and sore throat Progression of symptoms: sinus congestion, remaining sore throat, right ear pain, frontal pressure occasionally, sinus pain and pressure, pt states it is difficult to tell if sputum is from chest or post nasal drip and has some yellow coloring to it, dry hacking cough with some production. Pt relates current symptoms to eye drop use.   Treatment and Response: tried Afrin nose spray - helps congestion  Fever/chills/sweats: pt denies Wheezing/Shortness of breath: pt states she had had some wheezing and becomes short of breath when coughing spells occur and pt can not stop coughing. hemoptysis: pt denies   PMH (ex: seasonal allergies, asthma): Pt has seasonal allergies and uses Claritin, pt has had to use Claritin year long. Pt denies asthma.  Review of Systems Pt has been on eye drops for approximately 2 weeks, pt has one more week of taking all the prescribed eye drops. Pt able to stop som eye drops as directed however, Restaisis must be taken life-long and Acuval must be taken until allergy season is over. Pt states Acuval burns eye post administration currently, when at first, it was soothing.  General: Pt states she has been tired but relates this to not feeling good and being on so many medications such as the recent antibiotic and eye drops. Abd pain/bowel changes: Pt denies    Objective:   Physical Exam General appearance:good health ;well nourished; no acute distress or increased work of breathing is present.  No  lymphadenopathy about the head, neck, or axilla noted.   Eyes: No conjunctival inflammation or lid edema is present. Extraocular motion is intact. Vision is normal with testing by wall chart   Ears:  External ear exam shows no significant lesions or deformities.  Otoscopic examination reveals clear canals,  tympanic membranes are intact bilaterally without bulging, retraction, inflammation or discharge.  Nose:  External nasal examination shows no deformity or inflammation. Nasal mucosa are pink and moist without lesions or exudates. No septal dislocation or deviation.No obstruction to airflow.   Oral exam: Dental hygiene is good; lips and gums are healthy appearing.There is no oropharyngeal erythema or exudate noted.     Heart:  Normal rate and regular rhythm. S1 and S2 normal without gallop, murmur, click, rub or other extra sounds.   Lungs:Chest clear to auscultation; no wheezes, rhonchi,rales ,or rubs present.No increased work of breathing.   Dry cough  Extremities:  No cyanosis, edema, or clubbing  noted    Skin: Warm & dry            Assessment & Plan:   #1 rhinitis, extrinsic with inflammatory changes on CAT scan. No evidence of bacterial rhinosinusitis  #2 eustachian tube dysfunction  Plan: See orders and recommendations

## 2011-08-01 NOTE — Patient Instructions (Signed)
Plain Mucinex for thick secretions ;force NON dairy fluids . Use a Neti pot daily as needed for sinus congestion; going from open side to congested side . Nasal cleansing in the shower as discussed. Make sure that all residual soap is removed to prevent irritation. Fluticasone 1 spray in each nostril twice a day as needed. Use the "crossover" technique as discussed. Plain  Claritin 10 mg or Allegra 160 daily as needed for itchy eyes & sneezing. Go to [facility] for eustachian tube dysfunction. Drink thin  fluids liberally through the day and chew sugarless gum . Do the Valsalva maneuver several times a day to "pop" ears open.

## 2011-08-06 ENCOUNTER — Encounter: Payer: PRIVATE HEALTH INSURANCE | Admitting: Internal Medicine

## 2011-09-13 ENCOUNTER — Ambulatory Visit: Payer: PRIVATE HEALTH INSURANCE | Admitting: Internal Medicine

## 2011-09-16 ENCOUNTER — Encounter: Payer: Self-pay | Admitting: Internal Medicine

## 2011-09-16 ENCOUNTER — Telehealth: Payer: Self-pay | Admitting: *Deleted

## 2011-09-16 ENCOUNTER — Ambulatory Visit (INDEPENDENT_AMBULATORY_CARE_PROVIDER_SITE_OTHER): Payer: BC Managed Care – PPO | Admitting: Internal Medicine

## 2011-09-16 VITALS — BP 120/78 | HR 75 | Temp 98.7°F | Wt 159.0 lb

## 2011-09-16 DIAGNOSIS — J209 Acute bronchitis, unspecified: Secondary | ICD-10-CM

## 2011-09-16 MED ORDER — MOXIFLOXACIN HCL 400 MG PO TABS: ORAL_TABLET | ORAL | Status: DC

## 2011-09-16 MED ORDER — FLUTICASONE-SALMETEROL 250-50 MCG/DOSE IN AEPB: 1.0000 | INHALATION_SPRAY | Freq: Two times a day (BID) | RESPIRATORY_TRACT | Status: DC

## 2011-09-16 NOTE — Patient Instructions (Signed)
Advair one inhalation every 12 hours; gargle and spit after use .  Use Avelox sample before filling the prescription

## 2011-09-16 NOTE — Telephone Encounter (Signed)
Prior authorization form for Crestor completed and signed by Dr Shirlee Latch faxed to [place] [facility] [place] [phone]. Ph [phone].Original form to HIM.

## 2011-09-16 NOTE — Progress Notes (Signed)
  Subjective:    Patient ID: [name], female    DOB: 1940/05/21, 71 y.o.   MRN: [id number]  HPI 10 days ago she began to have rhinitis type symptoms followed by a cough. It can be hacking and is productive of green sputum in the mornings. She initially used decongestants and has also been using Mucinex with partial response. She's had sweats but no chills or fever. She denies shortness of breath or wheezing. There is no past history of asthma  Her husband has had bronchitis recently. She is on doxycycline one daily from her ophthalmologist.    Review of Systems she denies persistent frontal headache, facial pain, nasal purulence, earache or discharge.  Dr. Jennelle Human has started her on low-dose Aricept recently     Objective:   Physical Exam General appearance:good health ;well nourished; no acute distress or increased work of breathing is present.  No  lymphadenopathy about the head, neck, or axilla noted.   Eyes: No conjunctival inflammation or lid edema is present.   Ears:  External ear exam shows no significant lesions or deformities.  Otoscopic examination reveals clear canals, tympanic membranes are intact bilaterally without bulging, retraction, inflammation or discharge.  Nose:  External nasal examination shows no deformity or inflammation. Nasal mucosa are pink and moist without lesions or exudates. No septal dislocation or deviation.No obstruction to airflow.   Oral exam: Dental hygiene is good; lips and gums are healthy appearing.There is no oropharyngeal erythema or exudate noted.     Heart:  Normal rate and regular rhythm. S1 and S2 normal without gallop, murmur, click, rub or other extra sounds.   Lungs: Minor, low-grade scattered wheezing.No increased work of breathing.    Extremities:  No cyanosis, edema, or clubbing  noted    Skin: Warm & dry w/o jaundice or tenting.          Assessment & Plan:  #1 bronchitis with low-grade bronchospasm  Plan: See orders  and recommendations

## 2011-09-17 NOTE — Telephone Encounter (Signed)
Received approval for Crestor 09/16/11-06/11/14  Ph [phone]

## 2011-10-12 ENCOUNTER — Encounter (HOSPITAL_COMMUNITY): Payer: Self-pay | Admitting: *Deleted

## 2011-10-12 ENCOUNTER — Emergency Department (HOSPITAL_COMMUNITY)
Admission: EM | Admit: 2011-10-12 | Discharge: 2011-10-12 | Disposition: A | Discharge status: Home / Self Care | Payer: BC Managed Care – PPO | Attending: Emergency Medicine | Admitting: Emergency Medicine

## 2011-10-12 ENCOUNTER — Emergency Department (HOSPITAL_COMMUNITY): Payer: BC Managed Care – PPO

## 2011-10-12 DIAGNOSIS — K5900 Constipation, unspecified: Secondary | ICD-10-CM | POA: Insufficient documentation

## 2011-10-12 DIAGNOSIS — R5381 Other malaise: Secondary | ICD-10-CM | POA: Insufficient documentation

## 2011-10-12 DIAGNOSIS — M549 Dorsalgia, unspecified: Secondary | ICD-10-CM | POA: Insufficient documentation

## 2011-10-12 DIAGNOSIS — R42 Dizziness and giddiness: Secondary | ICD-10-CM | POA: Insufficient documentation

## 2011-10-12 DIAGNOSIS — K589 Irritable bowel syndrome without diarrhea: Secondary | ICD-10-CM | POA: Insufficient documentation

## 2011-10-12 DIAGNOSIS — Z79899 Other long term (current) drug therapy: Secondary | ICD-10-CM | POA: Insufficient documentation

## 2011-10-12 DIAGNOSIS — R630 Anorexia: Secondary | ICD-10-CM | POA: Insufficient documentation

## 2011-10-12 DIAGNOSIS — E119 Type 2 diabetes mellitus without complications: Secondary | ICD-10-CM | POA: Insufficient documentation

## 2011-10-12 DIAGNOSIS — R110 Nausea: Secondary | ICD-10-CM | POA: Insufficient documentation

## 2011-10-12 DIAGNOSIS — R51 Headache: Secondary | ICD-10-CM | POA: Insufficient documentation

## 2011-10-12 DIAGNOSIS — K5792 Diverticulitis of intestine, part unspecified, without perforation or abscess without bleeding: Secondary | ICD-10-CM

## 2011-10-12 DIAGNOSIS — K5732 Diverticulitis of large intestine without perforation or abscess without bleeding: Secondary | ICD-10-CM | POA: Insufficient documentation

## 2011-10-12 DIAGNOSIS — K219 Gastro-esophageal reflux disease without esophagitis: Secondary | ICD-10-CM | POA: Insufficient documentation

## 2011-10-12 DIAGNOSIS — R109 Unspecified abdominal pain: Secondary | ICD-10-CM

## 2011-10-12 DIAGNOSIS — E785 Hyperlipidemia, unspecified: Secondary | ICD-10-CM | POA: Insufficient documentation

## 2011-10-12 DIAGNOSIS — I252 Old myocardial infarction: Secondary | ICD-10-CM | POA: Insufficient documentation

## 2011-10-12 DIAGNOSIS — R1032 Left lower quadrant pain: Secondary | ICD-10-CM | POA: Insufficient documentation

## 2011-10-12 DIAGNOSIS — I2510 Atherosclerotic heart disease of native coronary artery without angina pectoris: Secondary | ICD-10-CM | POA: Insufficient documentation

## 2011-10-12 LAB — URINALYSIS, ROUTINE W REFLEX MICROSCOPIC
Glucose, UA: NEGATIVE mg/dL
Protein, ur: NEGATIVE mg/dL
Specific Gravity, Urine: 1.022 (ref 1.005–1.030)
pH: 5.5 (ref 5.0–8.0)

## 2011-10-12 LAB — URINE MICROSCOPIC-ADD ON

## 2011-10-12 LAB — CBC
HCT: 41.1 % (ref 36.0–46.0)
Hemoglobin: 13.8 g/dL (ref 12.0–15.0)
MCH: 33.2 pg (ref 26.0–34.0)
MCHC: 33.6 g/dL (ref 30.0–36.0)

## 2011-10-12 LAB — POCT I-STAT, CHEM 8
BUN: 9 mg/dL (ref 6–23)
Calcium, Ion: 1.19 mmol/L (ref 1.13–1.30)
Glucose, Bld: 126 mg/dL — ABNORMAL HIGH (ref 70–99)
HCT: 42 % (ref 36.0–46.0)
Hemoglobin: 14.3 g/dL (ref 12.0–15.0)
Potassium: 4 mEq/L (ref 3.5–5.1)
Sodium: 139 mEq/L (ref 135–145)

## 2011-10-12 MED ORDER — MORPHINE SULFATE 4 MG/ML IJ SOLN
4.0000 mg | Freq: Once | INTRAMUSCULAR | Status: AC
  Administered 2011-10-12: 4 mg via INTRAVENOUS
  Filled 2011-10-12: qty 1

## 2011-10-12 MED ORDER — ONDANSETRON HCL 4 MG/2ML IJ SOLN
4.0000 mg | Freq: Once | INTRAMUSCULAR | Status: AC
  Administered 2011-10-12: 4 mg via INTRAVENOUS
  Filled 2011-10-12: qty 2

## 2011-10-12 MED ORDER — SODIUM CHLORIDE 0.9 % IV BOLUS (SEPSIS)
1000.0000 mL | Freq: Once | INTRAVENOUS | Status: AC
  Administered 2011-10-12: 1000 mL via INTRAVENOUS

## 2011-10-12 MED ORDER — MORPHINE SULFATE 2 MG/ML IJ SOLN
2.0000 mg | Freq: Once | INTRAMUSCULAR | Status: AC
  Administered 2011-10-12: 2 mg via INTRAVENOUS

## 2011-10-12 MED ORDER — OXYCODONE-ACETAMINOPHEN 5-325 MG PO TABS: 1.0000 | ORAL_TABLET | ORAL | Status: DC | PRN

## 2011-10-12 MED ORDER — POLYETHYLENE GLYCOL 3350 17 G PO PACK: 17.0000 g | PACK | Freq: Every day | ORAL | Status: DC

## 2011-10-12 MED ORDER — IOHEXOL 300 MG/ML  SOLN
20.0000 mL | INTRAMUSCULAR | Status: AC
  Administered 2011-10-12: 20 mL via ORAL

## 2011-10-12 MED ORDER — MOXIFLOXACIN HCL 400 MG PO TABS: 400.0000 mg | ORAL_TABLET | Freq: Every day | ORAL | Status: DC

## 2011-10-12 MED ORDER — IOHEXOL 300 MG/ML  SOLN
100.0000 mL | Freq: Once | INTRAMUSCULAR | Status: AC | PRN
  Administered 2011-10-12: 100 mL via INTRAVENOUS

## 2011-10-12 MED ORDER — DOCUSATE SODIUM 100 MG PO CAPS
100.0000 mg | ORAL_CAPSULE | Freq: Two times a day (BID) | ORAL | Status: DC
Start: 2011-10-12 — End: 2011-10-23

## 2011-10-12 MED ORDER — MORPHINE SULFATE 4 MG/ML IJ SOLN
4.0000 mg | Freq: Once | INTRAMUSCULAR | Status: DC
  Filled 2011-10-12: qty 1

## 2011-10-12 NOTE — ED Notes (Signed)
Pt reports eating out on Tuesday. States that she had a steak that "smelled funny". That evening she began having nausea and ever since her abdomin has been hurting worse and worse. States that the pain radiates to her back. Pt. States she hasn't been able to eat or drink much. Pt. Feels weak and somewhat dizzy. Pt. bp 91 systolic.

## 2011-10-12 NOTE — ED Provider Notes (Signed)
History     CSN: [id number]  Arrival date & time 10/12/11  1024   First MD Initiated Contact with Patient 10/12/11 1306      Chief Complaint  Patient presents with  . Abdominal Pain    (Consider location/radiation/quality/duration/timing/severity/associated sxs/prior treatment) HPI  H/o GERD, diet controlled DM, diverticulitis pw abdominal pain x 4-5 days. Diffuse abdominal pain > LLQ. Constant. Cramping. No diarrhea. Last BM 6 days ago. Denies blood in stool. +Nausea without vomiting. Dec appetite. States that this does radiate to her back. Generalized weakness. Mild headache and lightheadedness. Denies hematuria/dysuria/freq/urgency. No vaginal discharge. States her sx began after she ate out and had "steak that tasted bad at the time". Nobody with similar meal or sx at home.  H/o colitis in past (71yo) and diverticulitis as well. Colonoscopy in past with diverticulosis  PMD_ Marga Melnick    ED Notes, ED Provider Notes from 10/12/11 0000 to 10/12/11 10:51:57       Emelie J Spinks, RN 10/12/2011 10:51      Pt reports eating out on Tuesday. States that she had a steak that "smelled funny". That evening she began having nausea and ever since her abdomin has been hurting worse and worse. States that the pain radiates to her back. Pt. States she hasn't been able to eat or drink much. Pt. Feels weak and somewhat dizzy. Pt. bp 91 systolic.     Past Medical History  Diagnosis Date  . Abnormal weight gain   . Unspecified adverse effect of unspecified drug, medicinal and biological substance   . Migraine, unspecified, without mention of intractable migraine without mention of status migrainosus   . Abdominal bruit   . Allergic rhinitis, cause unspecified   . Dehydration   . Diarrhea   . Acute myocardial infarction of anterolateral wall, subsequent episode of care   . Diverticular disease   . CAD (coronary artery disease)     diaphragmatic wall infarction in 1999, treated with balloon  angioplasty with nonobstructive disease at ast catheterization in 2007 as described above  . Hyperlipemia     with low HDL.  . IBS (irritable bowel syndrome)   . GERD (gastroesophageal reflux disease)   . Fibromyalgia   . Aspirin allergy     INTOLERANCE ALSO TO PLAVIX  . Diabetes mellitus     Diet Control    Past Surgical History  Procedure Date  . Abdominal surgery      @ [facility]  in [place] in 2002  for ptosis of organs; mersilene mesh sling, cystocopy  by Dr  Earlene Plater, Urologist  . Colonoscopy     tics hemorrhoids  . Tonsillectomy   . [place]     RECTOCOELE  . Bladder surgery     sling  . Cataract extraction     Family History  Problem Relation Age of Onset  . Heart attack Brother   . Heart disease Brother 13    1/2 BROTHER MI  . Bipolar disorder Sister   . Heart disease Sister 29    MI  . Heart attack Father   . Heart disease Father 12    MI  . Myelodysplastic syndrome Sister   . Stroke Sister     CVA...1/2 SISTER  . Alcohol abuse Mother   . Colon cancer Neg Hx     History  Substance Use Topics  . Smoking status: Never Smoker   . Smokeless tobacco: Never Used  . Alcohol Use: No    OB History  Grav Para Term Preterm Abortions TAB SAB Ect Mult Living                  Review of Systems  All other systems reviewed and are negative.   except as noted HPI   Allergies  Metronidazole; Penicillins; Amphetamine-dextroamphetamine; Atorvastatin; Azithromycin; Cephalexin; Chlorpromazine hcl; Ciprofloxacin; Doxycycline; Ezetimibe; Hyoscyamine sulfate; Lactose intolerance (gi); Lidocaine; Sulfamethoxazole; Telithromycin; and Topiramate  Home Medications   Current Outpatient Rx  Name Route Sig Dispense Refill  . ACETAMINOPHEN ER 650 MG PO TBCR Oral Take 1,300 mg by mouth 2 (two) times daily.    Marland Kitchen GAVISCON PO Oral Take 2 tablets by mouth as needed. For upset stomach/heartburn    . BACITRACIN 500 UNIT/GM OP OINT Left Eye Place 1 application into the left eye at  bedtime.    . BUPROPION HCL ER (XL) 300 MG PO TB24 Oral Take 300 mg by mouth daily.    . COQ10 100 MG PO CAPS Oral Take 1 capsule by mouth daily.     . CYCLOSPORINE 0.05 % OP EMUL Both Eyes Place 1 drop into both eyes 2 (two) times daily.    Marland Kitchen DEPAKOTE SPRINKLES 125 MG PO CPSP Oral Take 125 mg by mouth every morning.     Marland Kitchen DICLOFENAC SODIUM 50 MG PO TBEC Oral Take 50 mg by mouth 2 (two) times daily.     Marland Kitchen DICYCLOMINE HCL 10 MG PO CAPS Oral Take 10 mg by mouth daily.    . DONEPEZIL HCL 10 MG PO TABS Oral Take 10 mg by mouth Daily.    Marland Kitchen ESOMEPRAZOLE MAGNESIUM 40 MG PO CPDR Oral Take 40 mg by mouth at bedtime.    . OMEGA-3 FATTY ACIDS 1000 MG PO CAPS Oral Take 2 g by mouth daily.     Marland Kitchen FLUTICASONE PROPIONATE 50 MCG/ACT NA SUSP Nasal Place 1 spray into the nose 2 (two) times daily as needed for rhinitis. 16 g 2  . LORAZEPAM 0.5 MG PO TABS Oral Take 0.5 mg by mouth at bedtime.    Marland Kitchen MELATONIN 5 MG PO TABS Oral Take 1 tablet by mouth at bedtime.     Marland Kitchen METFORMIN HCL ER 500 MG PO TB24 Oral Take 500 mg by mouth Daily.    Marland Kitchen OLANZAPINE 2.5 MG PO TABS Oral Take 2.5 mg by mouth at bedtime. As directed    . ROSUVASTATIN CALCIUM 20 MG PO TABS Oral Take 1 tablet (20 mg total) by mouth at bedtime. 90 tablet 1    BP 111/56  Pulse 77  Temp 99.8 F (37.7 C) (Oral)  Resp 18  SpO2 97%  Physical Exam  Nursing note and vitals reviewed. Constitutional: She is oriented to person, place, and time. She appears well-developed.  HENT:  Head: Atraumatic.  Mouth/Throat: Oropharynx is clear and moist.  Eyes: Conjunctivae and EOM are normal. Pupils are equal, round, and reactive to light.  Neck: Normal range of motion. Neck supple.  Cardiovascular: Normal rate, regular rhythm, normal heart sounds and intact distal pulses.   Pulmonary/Chest: Effort normal and breath sounds normal. No respiratory distress. She has no wheezes. She has no rales.  Abdominal: Soft. She exhibits no distension. There is tenderness. There is  no rebound and no guarding.       Diffuse lower abd ttp > LLQ  Musculoskeletal: Normal range of motion.  Neurological: She is alert and oriented to person, place, and time.  Skin: Skin is warm and dry. No rash noted.  Psychiatric: She  has a normal mood and affect.    ED Course  Procedures (including critical care time)  Labs Reviewed  CBC - Abnormal; Notable for the following:    WBC 12.5 (*)     All other components within normal limits  URINALYSIS, ROUTINE W REFLEX MICROSCOPIC - Abnormal; Notable for the following:    Color, Urine AMBER (*)  BIOCHEMICALS MAY BE AFFECTED BY COLOR   Hgb urine dipstick SMALL (*)     Bilirubin Urine SMALL (*)     Ketones, ur 15 (*)     Leukocytes, UA SMALL (*)     All other components within normal limits  POCT I-STAT, CHEM 8 - Abnormal; Notable for the following:    Glucose, Bld 126 (*)     All other components within normal limits  URINE MICROSCOPIC-ADD ON - Abnormal; Notable for the following:    Squamous Epithelial / LPF FEW (*)     All other components within normal limits  OCCULT BLOOD, POC DEVICE  OCCULT BLOOD X 1 CARD TO LAB, STOOL   No results found.   No diagnosis found.   MDM  Abdominal pain, constipation. DDx broad including bowel obstruction, diverticulitis v/s other infectious etiology. Pain controlled in ED with morphine. Given IVF and zofran as well. Labs remarkable for mild leukocytosis. Microscopic hematuria, small LE. Contaminated sample, Do not suspect UTI clinically. CT A/P pending for further evaluation. Dispo pending results. Pt signed out to Dr. Alto Denver.   BP 111/56  Pulse 77  Temp 99.8 F (37.7 C) (Oral)  Resp 18  SpO2 97%    Forbes Cellar, MD 10/12/11 1622

## 2011-10-14 ENCOUNTER — Ambulatory Visit (INDEPENDENT_AMBULATORY_CARE_PROVIDER_SITE_OTHER): Payer: BC Managed Care – PPO | Admitting: Internal Medicine

## 2011-10-14 ENCOUNTER — Encounter: Payer: Self-pay | Admitting: Internal Medicine

## 2011-10-14 VITALS — BP 122/80 | HR 77 | Temp 98.4°F | Resp 14 | Ht 60.5 in | Wt 153.2 lb

## 2011-10-14 DIAGNOSIS — K5732 Diverticulitis of large intestine without perforation or abscess without bleeding: Secondary | ICD-10-CM

## 2011-10-14 DIAGNOSIS — K5792 Diverticulitis of intestine, part unspecified, without perforation or abscess without bleeding: Secondary | ICD-10-CM

## 2011-10-14 DIAGNOSIS — R319 Hematuria, unspecified: Secondary | ICD-10-CM

## 2011-10-14 DIAGNOSIS — R8290 Unspecified abnormal findings in urine: Secondary | ICD-10-CM

## 2011-10-14 DIAGNOSIS — R7309 Other abnormal glucose: Secondary | ICD-10-CM

## 2011-10-14 DIAGNOSIS — R102 Pelvic and perineal pain: Secondary | ICD-10-CM

## 2011-10-14 DIAGNOSIS — R82998 Other abnormal findings in urine: Secondary | ICD-10-CM

## 2011-10-14 DIAGNOSIS — R109 Unspecified abdominal pain: Secondary | ICD-10-CM

## 2011-10-14 LAB — POCT URINALYSIS DIPSTICK
Glucose, UA: NEGATIVE
Leukocytes, UA: NEGATIVE
Nitrite, UA: NEGATIVE
Spec Grav, UA: >=1.03
Urobilinogen, UA: 0.2

## 2011-10-14 LAB — HEMOGLOBIN A1C: Hgb A1c MFr Bld: 5.7 % (ref 4.6–6.5)

## 2011-10-14 MED ORDER — PHENAZOPYRIDINE HCL 200 MG PO TABS: 200.0000 mg | ORAL_TABLET | Freq: Three times a day (TID) | ORAL | Status: AC | PRN

## 2011-10-14 NOTE — Patient Instructions (Addendum)
Stay on clear liquids for 48-72 hours or until bowels are normal.This would include  jello, sherbert (NOT ice cream), Lipton's chicken noodle soup(NOT cream based soups),Gatorade Lite, flat Ginger ale (without High Fructose Corn Syrup),dry toast or crackers, baked potato.No milk , dairy or grease until bowels are formed. Report increasing pain, fever or rectal bleeding  Please try to go on My Chart within the next 24 hours to allow me to release the results directly to you.

## 2011-10-14 NOTE — Progress Notes (Signed)
  Subjective:    Patient ID: [name], female    DOB: 01-08-1941, 71 y.o.   MRN: [id number]  HPI She was scheduled for Medicare wellness visit; but she has an acute issue. She was seen in emergency room 10/12/11 with apparent diverticulitis. Those records were reviewed. Urinalysis revealed a small amount of blood and bilirubin as well as leukocytes. Nonfasting glucose is 126. White count was elevated at 12,500. There was no anemia. She was given a prescription for Avelox; she has multiple drug allergies/intolerances which precluded use of metronidazole and Cipro. Her low-grade fever has resolved as has her nausea. She remains on clear liquid diet. The left lower quadrant discomfort has improved but she continues to have intermittent severe  cramping in the suprapubic area which wakes her to void. Voiding does improve the cramping significantly. Past medical history/family history/social history were all reviewed and updated. Pertinent data: Her last colonoscopy was 2011; diverticulosis was present. She was told it should not need another colonoscopy for 10 years.  CT scan 01/04/11 revealed extensive diverticulosis involving the sigmoid colon. There was a short segment of inflammatory change involving the mid sigmoid colon consistent with acute diverticulitis.There was also some proximal descending colon wall thickening and hyperemia suggesting localized colitis. She has a past history of recurrent cystitis for which she saw her gynecologist, Dr. Rosalio Macadamia. She also has seen Dr McDiarmid, Urology  Her ophthalmologist had her on doxycycline 50 mg which she stopped 10 days ago because of some vaginitis      Review of Systems  Nausea/Vomiting: see above; no vomiting Diarrhea: no Constipation: no but Dr Hyman Hopes ER MD prescribed Miralax Melena/BRBPR: no ; FOB negative in ER Hematemesis: no Anorexia: improving  Fever/Chills:not now Dysuria/hematuria/pyuria: no but nocturia X 5 last night Wt loss:  no EtOH use: no NSAIDs/ASA: Diclofenac Vaginal bleeding: no       Objective:   Physical Exam General appearance is one of good health and nourishment w/o distress.  Eyes: No conjunctival inflammation or scleral icterus is present.  Oral exam: Dental hygiene is good; lips and gums are healthy appearing.There is no oropharyngeal erythema or exudate noted. Small osteoma of hard palate  Heart:  Normal rate and regular rhythm. S1 and S2 normal without gallop, murmur, click, rub . S 4  Lungs:Chest clear to auscultation; no wheezes, rhonchi,rales ,or rubs present.No increased work of breathing.   Abdomen: bowel sounds normal, soft . She exhibits some tenderness in the left lower quadrant and in the suprapubic area without masses, organomegaly or hernias noted.  No guarding or rebound   No tenderness percussion over the posterior flanks  Skin:Warm & dry.  Intact without suspicious lesions or rashes ; no jaundice or tenting  Lymphatic: No lymphadenopathy is noted about the head, neck, axilla areas.   Neuro/psychiatric: She is alert and oriented and interactive             Assessment & Plan:  #1 diverticulitis left lower quadrant, recurrent  #2 suprapubic pain with small hemoglobin and leukocytes on urinalysis. Rule out cystitis  #3 hyperglycemia; she is not on metformin  Plan: She should continue the clear liquids until she is pain free. She is on Avelox because of multiple drug intolerances and allergies. This includes metronidazole and ciprofloxacin. The urine will be cultured; Lab will be notified that she has taken 2 doses of Avelox

## 2011-10-15 ENCOUNTER — Telehealth: Payer: Self-pay | Admitting: Internal Medicine

## 2011-10-15 MED ORDER — MAGIC MOUTHWASH: ORAL | Status: DC

## 2011-10-15 NOTE — Telephone Encounter (Signed)
Hopper pt please advise 

## 2011-10-15 NOTE — Telephone Encounter (Signed)
Dr.Hopper please advise and send to Triage Assistant Burnard Leigh)

## 2011-10-15 NOTE — Telephone Encounter (Signed)
Magic mouthwash 5 cc (1 teaspoon) gargled well  and swallowed  3 times a day .90 cc

## 2011-10-15 NOTE — Telephone Encounter (Signed)
Rx done/SLS 

## 2011-10-15 NOTE — Telephone Encounter (Signed)
Caller: [name]/Patient; PCP: Marga Melnick; CB#: [phone]; Call regarding Mouth Sores; requests Magic Mouthwash.  States she is on atbx for diverticulitis and has sx of UTI.  Seen  on 10/14/11 and urine sample collected for culture.  Mouth sores onset 10/14/11; increased in number and discomfort.  "These little spots are tender"  Onset after starting Avelox for diverticulitis.  Per Mouth Lesions protocol see in 72 hours. Caller requests Rx be called in before 1030 if possible as pharmacy makes deliveries at 1100; she prefers delivery.

## 2011-10-16 LAB — URINE CULTURE: Colony Count: 9000

## 2011-10-19 ENCOUNTER — Other Ambulatory Visit: Payer: Self-pay | Admitting: Internal Medicine

## 2011-10-22 ENCOUNTER — Telehealth: Payer: Self-pay | Admitting: Internal Medicine

## 2011-10-22 NOTE — Telephone Encounter (Signed)
Patient was seen in the ER on 10/14/11 for LLQ pain she was diagnosed with Diverticulitis and started on Avelox due to multiple allergies.  She ate her 1st solid meal last night and now this am has abdominal cramping and terrible diarrhea.  She is offered an appt for today with Willette Cluster RNP, but she does not have a ride and doesn't feel she can drive herself.  She will come in tomorrow at 10:00 and see Willette Cluster RNP at 10;00

## 2011-10-23 ENCOUNTER — Encounter: Payer: Self-pay | Admitting: Nurse Practitioner

## 2011-10-23 ENCOUNTER — Ambulatory Visit (INDEPENDENT_AMBULATORY_CARE_PROVIDER_SITE_OTHER): Payer: BC Managed Care – PPO | Admitting: Nurse Practitioner

## 2011-10-23 ENCOUNTER — Other Ambulatory Visit: Payer: BC Managed Care – PPO

## 2011-10-23 VITALS — BP 124/70 | HR 88 | Temp 98.7°F | Ht 61.0 in | Wt 151.2 lb

## 2011-10-23 DIAGNOSIS — K5732 Diverticulitis of large intestine without perforation or abscess without bleeding: Secondary | ICD-10-CM

## 2011-10-23 DIAGNOSIS — R197 Diarrhea, unspecified: Secondary | ICD-10-CM

## 2011-10-23 DIAGNOSIS — K5792 Diverticulitis of intestine, part unspecified, without perforation or abscess without bleeding: Secondary | ICD-10-CM

## 2011-10-23 DIAGNOSIS — K589 Irritable bowel syndrome without diarrhea: Secondary | ICD-10-CM

## 2011-10-23 NOTE — Patient Instructions (Addendum)
Please go to the basement level to have your labs drawn.  Stay on a bland diet for next 7-10 days.  We have given you a list of foods. You can take Imodium twice daily as needed for the diarrhea. We have made a follow up appointment with Dr. Juanda Chance for you on 11-26-2011 at 3:30 PM.

## 2011-10-23 NOTE — Progress Notes (Signed)
[name] [id number] 1940-10-29   HISTORY OR PRESENT ILLNESS : Patient is a 71 year old female followed by Dr. Juanda Chance for history of irritable bowel syndrome, diverticulosis and diverticulitis (CTscan September 2012). Scan also showed an 8 cm segment of proximal descending colon with abnormal wall thickening and hyperemia consistent with focal segmental colitis.She has multiple medical problems, is on multiple medications and has multiple allergies. Patient had a normal upper endoscopy May 2009 for epigastric pain. Her last colonoscopy was November 2010 and revealed moderate diverticulosis, tortuous colon and internal hemorrhoids.   We saw the patient last in February of this year at which time she was having intermittent abdominal pain and diarrhea in the setting of prolonged antibiotic use. We did not think she had diverticulitis at that time. Stool studies were checked, patient was started on Align and bowel antispasmodics. C. Diff by PCR was negative.    Patient here for evaluation of abdominal pain and diarrhea. She seen in the ER on 10/12/11 for LLQ pain. CT scan showed extensive diverticulosis of the sigmoid colon and mild peri-colic inflammatory changes in that region consistent with low-level diverticulitis. Because of her multiple allergies, patient was given Avelox. She completed her tenth day of Avelox 2 days ago, but does not feel that she is "out of the woods". Her abdominal pain is better but not gone. She is having diarrhea. Yesterday she had several watery bowel movements, took Imodium and today her stools are just loose, not watery. No fever. She cannot tolerate solid food secondary to diarrhea.    Current Medications, Allergies, Past Medical History, Past Surgical History, Family History and Social History were reviewed in [employer] record.   PHYSICAL EXAMINATION : General:  Well developed  female in no acute distress Head: Normocephalic and  atraumatic Eyes:  sclerae anicteric,conjunctive pink. Ears: Normal auditory acuity Neck: Supple, no masses.  Lungs: Clear throughout to auscultation Heart: Regular rate and rhythm; no murmurs heard Abdomen: Soft, nondistended, nontender. No masses or hepatomegaly noted. Normal bowel sounds Rectal: not done Musculoskeletal: Symmetrical with no gross deformities  Skin: No lesions on visible extremities Extremities: No edema or deformities noted Neurological: Oriented x 4, grossly nonfocal Cervical Nodes:  No significant cervical adenopathy Psychological:  Alert and cooperative. Normal mood and affect  ASSESSMENT AND PLAN :   1. Diarrhea, possibly antibiotic induced, possibly secondary to C. difficile. She also has underlying IBS. Will check stool studies in setting of recent Avelox. She should continue a bland diet for the next 7-10 days then advance slowly as tolerated. If she patient continues to have persistent diarrhea and stool studies are negative then she may need further workup. She cannot take Align. Advised Imodium 1 to 2 times a day as needed. Patient will followup with Dr. Juanda Chance. In the meantime we will call her with stool study results and further recommendations.   2. Recurrent diverticulitis, resolving. Low-grade diverticulitis on CT scan in the emergency department 10 days ago. Her pain has significantly improved on Avelox though she does not feel back to baseline. Abdominal exam is not overly concerning. I don't think additional antibiotics warranted at this time

## 2011-10-24 DIAGNOSIS — K5792 Diverticulitis of intestine, part unspecified, without perforation or abscess without bleeding: Secondary | ICD-10-CM | POA: Insufficient documentation

## 2011-10-24 NOTE — Progress Notes (Signed)
Reviewed and agree with management plan. Byron Tipping T. Lennox Dolberry MD FACG 

## 2011-11-07 ENCOUNTER — Encounter: Payer: Self-pay | Admitting: Nurse Practitioner

## 2011-11-07 ENCOUNTER — Telehealth: Payer: Self-pay | Admitting: Internal Medicine

## 2011-11-07 ENCOUNTER — Ambulatory Visit (INDEPENDENT_AMBULATORY_CARE_PROVIDER_SITE_OTHER): Payer: BC Managed Care – PPO | Admitting: Nurse Practitioner

## 2011-11-07 VITALS — BP 124/80 | HR 84 | Ht 61.5 in | Wt 147.4 lb

## 2011-11-07 DIAGNOSIS — K5732 Diverticulitis of large intestine without perforation or abscess without bleeding: Secondary | ICD-10-CM

## 2011-11-07 DIAGNOSIS — K5792 Diverticulitis of intestine, part unspecified, without perforation or abscess without bleeding: Secondary | ICD-10-CM

## 2011-11-07 NOTE — Telephone Encounter (Signed)
DB's patient. Needs REV to evaluate. Can she see Gunnar Fusi today or tomorrow?

## 2011-11-07 NOTE — Patient Instructions (Addendum)
  You have been scheduled for a CT scan of the abdomen and pelvis at [facility] ([address]---this is in the same building as [facility]).   You are scheduled on 11-08-2011 at 1:00 PM  You should arrive at 12:45 PM  prior to your appointment time for registration. Please follow the written instructions below on the day of your exam:  WARNING: IF YOU ARE ALLERGIC TO IODINE/X-RAY DYE, PLEASE NOTIFY RADIOLOGY IMMEDIATELY AT [phone]! YOU WILL BE GIVEN A 13 HOUR PREMEDICATION PREP.  1) Do not eat or drink anything after 9:00 Am  (4 hours prior to your test) 2) You have been given 2 bottles of oral contrast to drink. The solution may taste better if refrigerated, but do NOT add ice or any other liquid to this solution. Shake  well before drinking.    Drink 1 bottle of contrast @ 11:00 A M (2 hours prior to your exam)  Drink 1 bottle of contrast @ 12:00 Noon (1 hour prior to your exam)  You may take any medications as prescribed with a small amount of water except for the following: Metformin, Glucophage, Glucovance, Avandamet, Riomet, Fortamet, Actoplus Met, Janumet, Glumetza or Metaglip. The above medications must be held the day of the exam AND 48 hours after the exam.  The purpose of you drinking the oral contrast is to aid in the visualization of your intestinal tract. The contrast solution may cause some diarrhea. Before your exam is started, you will be given a small amount of fluid to drink. Depending on your individual set of symptoms, you may also receive an intravenous injection of x-ray contrast/dye. Plan on being at [facility] for 30 minutes or long, depending on the type of exam you are having performed.  If you have any questions regarding your exam or if you need to reschedule, you may call the CT department at [phone] between the hours of 8:00 am and 5:00 pm,  Monday-Friday.  ________________________________________________________________________

## 2011-11-07 NOTE — Telephone Encounter (Signed)
Pt with hx of IBS, Diverticulosis and Diverticulitis. Pt saw Willette Cluster, NP on 10/23/11 after several weeks of LLQ abdominal pain. Pt had CT scan on 10/12/11 for ER visit showing resolving inflammation. Paula placed pt on Avelox for possible resolving diverticulitis. Today, pt reports she is still having the LLQ abdominal pain. She is having diarrhea, but no obvious blood is noted. She reports a fever on and off. Please advise. Thanks.

## 2011-11-07 NOTE — Progress Notes (Signed)
[name] [id number] [birth year]   HISTORY OR PRESENT ILLNESS :  [name] is a 71 year old female followed by Dr. Juanda Chance for IBS and chronic abdominal pain. She was seen here several days ago after having been treated by ED for documented diverticulitis. Patient has multiple drug allergies, she was treated with a course of Avelox. She is back in with recurrent abdominal pain and diarrhea after trying to advance  her diet. She takes 2 Immodium at start of loose stool and usually doesn't have any further diarrhea for the day but the lower abdominal pain is unrelenting. No fevers. Stool studies several days ago were negative.     Current Medications, Allergies, Past Medical History, Past Surgical History, Family History and Social History were reviewed in [employer] record.   PHYSICAL EXAMINATION : General:  Well developed  female in no acute distress Head: Normocephalic and atraumatic Eyes:  sclerae anicteric,conjunctive pink. Ears: Normal auditory acuity Neck: Supple, no masses.  Lungs: Clear throughout to auscultation Heart: Regular rate and rhythm; no murmurs heard Abdomen: Soft, nondistended, moderate diffuse lower abdominal tenderness. No masses or hepatomegaly noted. Normal bowel sounds Rectal: not done Musculoskeletal: Symmetrical with no gross deformities  Skin: No lesions on visible extremities Extremities: No edema or deformities noted Neurological: Oriented x 4, grossly nonfocal Cervical Nodes:  No significant cervical adenopathy Psychological:  Alert and cooperative. Flat affect.   ASSESSMENT AND PLAN :  Acute on chronic abdominal pain. Patient treated with Avelox by ED for documented diverticulitis. She is having recurrent lower abdominal pain. She has multiple, multiple allergies. She also has chronic abdominal pain / IBS with diarrhea. Without a repeat CTscan I am hesitant to give her additional antibiotics given all her allergies. Will obtain  repeat CTscan to see if diverticulitis has resolved. She cannot tolerate Hyoscyamine or Align for IBS. We will call her with scan results and further recommendations.

## 2011-11-07 NOTE — Telephone Encounter (Signed)
Pt scheduled to see Willette Cluster, NP today.

## 2011-11-08 ENCOUNTER — Ambulatory Visit (INDEPENDENT_AMBULATORY_CARE_PROVIDER_SITE_OTHER)
Admission: RE | Admit: 2011-11-08 | Discharge: 2011-11-08 | Disposition: A | Discharge status: Home / Self Care | Payer: BC Managed Care – PPO | Source: Ambulatory Visit | Attending: Nurse Practitioner | Admitting: Nurse Practitioner

## 2011-11-08 DIAGNOSIS — K5792 Diverticulitis of intestine, part unspecified, without perforation or abscess without bleeding: Secondary | ICD-10-CM

## 2011-11-08 DIAGNOSIS — K5732 Diverticulitis of large intestine without perforation or abscess without bleeding: Secondary | ICD-10-CM

## 2011-11-08 MED ORDER — IOHEXOL 300 MG/ML  SOLN
100.0000 mL | Freq: Once | INTRAMUSCULAR | Status: AC | PRN
  Administered 2011-11-08: 100 mL via INTRAVENOUS

## 2011-11-11 ENCOUNTER — Encounter: Payer: Self-pay | Admitting: Nurse Practitioner

## 2011-11-12 ENCOUNTER — Encounter: Payer: Self-pay | Admitting: *Deleted

## 2011-11-12 ENCOUNTER — Telehealth: Payer: Self-pay | Admitting: *Deleted

## 2011-11-12 MED ORDER — CEPHALEXIN 500 MG PO CAPS: ORAL_CAPSULE | ORAL | Status: DC

## 2011-11-12 NOTE — Telephone Encounter (Signed)
She was on Keflex 500 mg po qid by Amy on9/2012 x 10 days.ibuprofen don't see anywhere That she could not tolerate it. ----- Message ----- From: Daphine Deutscher, RN Sent: 11/11/2011 1:00 PM To: Hart Carwin, MD Dr. Lynder Parents saw this patient and got a CT. She had me forward it to you. She wanted you to review and suggest which antibiotic patient would need. Patient has multiple allergies and she is not sure what to put her on. [name] Per Willette Cluster, NP , Keflex 500 mg po QID x 10 days. Rx sent and patient notified.

## 2011-11-18 ENCOUNTER — Telehealth: Payer: Self-pay | Admitting: Nurse Practitioner

## 2011-11-18 DIAGNOSIS — R197 Diarrhea, unspecified: Secondary | ICD-10-CM

## 2011-11-18 DIAGNOSIS — K5792 Diverticulitis of intestine, part unspecified, without perforation or abscess without bleeding: Secondary | ICD-10-CM

## 2011-11-18 NOTE — Telephone Encounter (Signed)
Xifaxin 500 mg, 1 po bid x 10 days, she may pick up samples if I have any in my office. Please schedule for colonoscopy. Last exam 2010, she has had too many flare ups recently, we need to know if she is developing obstruction,

## 2011-11-18 NOTE — Telephone Encounter (Signed)
Patient started on Keflex 6 days ago for diverticulitis. She was feeling better until this weekend. She started having diarrhea over the weekend. States she has had too many diarrhea stools to count the # per day. She is having cramping with diarrhea. She has not taken anything for the diarrhea. She did not take her Keflex dose this AM. Please, advise.

## 2011-11-18 NOTE — Progress Notes (Signed)
Agree with initial assessment and plans. Gunnar Fusi to follow up on CT and arrange appropriate follow up based on the results

## 2011-11-19 ENCOUNTER — Telehealth: Payer: Self-pay | Admitting: Nurse Practitioner

## 2011-11-19 ENCOUNTER — Other Ambulatory Visit: Payer: Self-pay | Admitting: *Deleted

## 2011-11-19 ENCOUNTER — Encounter: Payer: Self-pay | Admitting: Gastroenterology

## 2011-11-19 MED ORDER — RIFAXIMIN 550 MG PO TABS: ORAL_TABLET | ORAL | Status: DC

## 2011-11-19 MED ORDER — RIFAXIMIN 550 MG PO TABS: 550.0000 mg | ORAL_TABLET | Freq: Two times a day (BID) | ORAL | Status: DC

## 2011-11-19 NOTE — Telephone Encounter (Signed)
Patient calling back and she is worried about taking the Xifaxan because she has so many antibiotic allergies. States she "read about it and is afraid."Patient now states she does not think diarrhea was as bad as she thought. She wants to complete the Keflex instead. Please, advise.

## 2011-11-19 NOTE — Telephone Encounter (Signed)
Spoke with patient and she wants the rx called to [facility]. States she cannot pick up samples. Scheduled colonoscopy on 01/01/12 at 3:30 PM. Patient to have teaching at [facility] on 11/26/11.

## 2011-11-19 NOTE — Telephone Encounter (Signed)
OK to give Keflex 250 mg, @28 , 1 po gid, no refill

## 2011-11-20 NOTE — Telephone Encounter (Signed)
Per Dr. Juanda Chance ok to finish up Keflex. Patient notified.

## 2011-11-26 ENCOUNTER — Encounter: Payer: Self-pay | Admitting: Internal Medicine

## 2011-11-26 ENCOUNTER — Ambulatory Visit (INDEPENDENT_AMBULATORY_CARE_PROVIDER_SITE_OTHER): Payer: BC Managed Care – PPO | Admitting: Internal Medicine

## 2011-11-26 VITALS — BP 120/68 | HR 80 | Ht 61.5 in | Wt 146.0 lb

## 2011-11-26 DIAGNOSIS — R1032 Left lower quadrant pain: Secondary | ICD-10-CM

## 2011-11-26 DIAGNOSIS — K5732 Diverticulitis of large intestine without perforation or abscess without bleeding: Secondary | ICD-10-CM

## 2011-11-26 DIAGNOSIS — K5792 Diverticulitis of intestine, part unspecified, without perforation or abscess without bleeding: Secondary | ICD-10-CM

## 2011-11-26 MED ORDER — MOVIPREP 100 G PO SOLR: ORAL | Status: DC

## 2011-11-26 NOTE — Patient Instructions (Addendum)
You have been scheduled for a colonoscopy with propofol. Please follow written instructions given to you at your visit today.  Please pick up your prep kit at the pharmacy within the next 1-3 days. If you use inhalers (even only as needed), please bring them with you on the day of your procedure. CC: Dr Marga Melnick

## 2011-11-26 NOTE — Progress Notes (Signed)
[name] [birth year] MRN [id number]   History of Present Illness:  This is a 71 year old white female with recurrent attacks of diverticulitis. She also has a history of irritable bowel syndrome. She had diverticulitis on a CT scan in September 2012 with focal segmental colitis. Her last colonoscopy in 2010 showed moderately severe diverticulosis with a tortuous colon and internal hemorrhoids. She had a recurrent flareup of left lower quadrant abdominal pain in July 2013 and a CT scan on 10/12/2011 for left lower quadrant abdominal pain showed mild pericolic inflammation. She was treated with Avelox for 10 days. She was intolerant to [employer]. A repeat CT scan of the abdomen on 11/07/2011 after she had a recurrence of diarrhea and pain showed persistent mild diverticulitis involving the sigmoid colon with persistent mild wall thickening and pericolonic inflammation. Her C. difficile toxin was negative. She has multiple antibiotic allergies and intolerances. Her most recent antibiotic was Keflex 250 mg 4 times a day. She completed a 10 day course of it this weekend. She denies fever or rectal bleeding. Her problem is diarrhea.   Past Medical History  Diagnosis Date  . Abnormal weight gain   . Unspecified adverse effect of unspecified drug, medicinal and biological substance   . Migraine, unspecified, without mention of intractable migraine without mention of status migrainosus   . Abdominal bruit   . Allergic rhinitis, cause unspecified   . Dehydration   . Acute myocardial infarction of anterolateral wall, subsequent episode of care   . Diverticular disease     acute diverticulitis 10/13/11  . CAD (coronary artery disease)     diaphragmatic wall infarction in 1999, treated with balloon angioplasty with nonobstructive disease at ast catheterization in 2007 as described above  . Hyperlipemia     with low HDL.  . IBS (irritable bowel syndrome)   . GERD (gastroesophageal reflux disease)     . Fibromyalgia   . Aspirin allergy     INTOLERANCE ALSO TO PLAVIX  . Diabetes mellitus     Diet Control   Past Surgical History  Procedure Date  . Abdominal surgery      @ [facility]  in [place] in 2002  for ptosis of organs; mersilene mesh sling, cystocopy  by Dr  Earlene Plater, Urologist  . Colonoscopy 2011    Diverticulosis & hemorrhoids; Dr Juanda Chance  . Tonsillectomy   . Vagiocoele     RECTOCOELE  . Bladder surgery     sling  . Cataract extraction     left eye  . Partial hysterectomy     reports that she has never smoked. She has never used smokeless tobacco. She reports that she does not drink alcohol or use illicit drugs. family history includes Alcohol abuse in her mother; Bipolar disorder in her sister; Heart attack in her brother and father; Heart attack (age of onset:54) in her sister; Heart attack (age of onset:57) in her brother; Heart attack (age of onset:72) in her father; Stroke in her sister; and Ulcers in her father.  There is no history of Colon cancer. Allergies  Allergen Reactions  . Metronidazole     REACTION: SORES IN MOUTH, severe diarrhea  . Penicillins     RASH  . Amphetamine-Dextroamphetamine   . Atorvastatin   . Azithromycin   . Cephalexin   . Chlorpromazine Hcl   . Ciprofloxacin     REACTION: SEVERE PAIN,DEHYDRATION,DIARRHEA  . Doxycycline   . Ezetimibe     REACTION: JOINT/MUSCLE ACHES  . Hyoscyamine Sulfate   .  Lactose Intolerance (Gi)   . Lidocaine Swelling  . Sulfamethoxazole     REACTION: GI upset-acid reflux  . Telithromycin   . Topiramate         Review of Systems: Occasional nausea. No dysphagia or vomiting. Positive for weight loss of 20 pounds  The remainder of the 10 point ROS is negative except as outlined in H&P   Physical Exam: General appearance  Well developed, in no distress. Eyes- non icteric. HEENT nontraumatic, normocephalic. Mouth no lesions, tongue papillated, no cheilosis. Neck supple without adenopathy, thyroid not enlarged,  no carotid bruits, no JVD. Lungs Clear to auscultation bilaterally. Cor normal S1, normal S2, regular rhythm, no murmur,  quiet precordium. Abdomen: Protuberant soft with minimal tenderness in left lower quadrant. Rectal: Not done. Extremities no pedal edema. Skin no lesions. Neurological alert and oriented x 3. Psychological normal mood and affect.  Assessment and Plan:  Problem #56 71 year old white female with radiographic evidence of diverticulitis on 3 CT scans in the past 12 months. She has had poor response to antibiotics and has multiple allergies and intolerances to multiple medications so the treatment is rather limited. We have scheduled a colonoscopy to assess her for nonspecific colitis and to rule out the possibility of chronic thickening of the left colon resembling diverticulitis. She has known irritable bowel syndrome. She is at high risk for C. difficile colitis because of multiple broad-spectrum antibiotics. She can take Imodium when necessary for diarrhea and should stay on a low residue diet.   11/26/2011 [name]

## 2011-11-27 ENCOUNTER — Encounter: Payer: Self-pay | Admitting: Internal Medicine

## 2011-12-03 ENCOUNTER — Encounter: Payer: Self-pay | Admitting: *Deleted

## 2011-12-03 ENCOUNTER — Encounter: Payer: Self-pay | Admitting: Gastroenterology

## 2011-12-03 ENCOUNTER — Telehealth: Payer: Self-pay | Admitting: *Deleted

## 2011-12-03 NOTE — Telephone Encounter (Signed)
Spoke with patient and mailed new instructions to patient.

## 2011-12-20 ENCOUNTER — Ambulatory Visit (AMBULATORY_SURGERY_CENTER): Payer: BC Managed Care – PPO | Admitting: Internal Medicine

## 2011-12-20 ENCOUNTER — Other Ambulatory Visit: Payer: Self-pay | Admitting: Internal Medicine

## 2011-12-20 ENCOUNTER — Encounter: Payer: Self-pay | Admitting: Internal Medicine

## 2011-12-20 VITALS — BP 114/80 | HR 71 | Temp 99.0°F | Resp 21 | Ht 61.0 in | Wt 146.0 lb

## 2011-12-20 DIAGNOSIS — K599 Functional intestinal disorder, unspecified: Secondary | ICD-10-CM

## 2011-12-20 DIAGNOSIS — K5732 Diverticulitis of large intestine without perforation or abscess without bleeding: Secondary | ICD-10-CM

## 2011-12-20 DIAGNOSIS — D126 Benign neoplasm of colon, unspecified: Secondary | ICD-10-CM

## 2011-12-20 DIAGNOSIS — K635 Polyp of colon: Secondary | ICD-10-CM

## 2011-12-20 HISTORY — DX: Polyp of colon: K63.5

## 2011-12-20 MED ORDER — SODIUM CHLORIDE 0.9 % IV SOLN: 500.0000 mL | INTRAVENOUS | Status: DC

## 2011-12-20 NOTE — Progress Notes (Addendum)
Pt c/o pain in abdomen. Rates it an 8 out of 10.  Pt discussed with Dr Juanda Chance.  States that she came in with this pain.  She states that it started while taking the prep.  Hyoscyomine is listed as an allergy.  Per Dr Juanda Chance, will give 2 Hyoscyomine SL for abd pain.  Dr Juanda Chance discussed with pt that her sigmoid area is irritated and this is what is causing her pain.  Pt instructed to pass air to ease pain.  Will wait to see if Levsin relieves pain.

## 2011-12-20 NOTE — Op Note (Signed)
[facility] [address] [place], [address]   COLONOSCOPY PROCEDURE REPORT  PATIENT: [name], [name]  MR#: [id number] BIRTHDATE: [birth year] , 71  yrs. old GENDER: Female ENDOSCOPIST: Hart Carwin, MD REFERRED BY: PROCEDURE DATE:  12/20/2011 PROCEDURE:   Colonoscopy with cold biopsy polypectomy and Colonoscopy with biopsy ASA CLASS:   Class II INDICATIONS:chronic LLQ abd pain associated with radiographic evidence of diverticulitis vs thickened sigmoid colon, multiple antibiotic allergies. MEDICATIONS: MAC sedation, administered by CRNA and Propofol (Diprivan) 170 mg IV  DESCRIPTION OF PROCEDURE:   After the risks and benefits and of the procedure were explained, informed consent was obtained.  A digital rectal exam revealed no abnormalities of the rectum.    The LB [id number]-[id number]  endoscope was introduced through the anus and advanced to the cecum, which was identified by both the appendix and ileocecal valve .  The quality of the prep was good, using MoviPrep .  The instrument was then slowly withdrawn as the colon was fully examined.     COLON FINDINGS: There was severe diverticulosis noted in the sigmoid colon with associated muscular hypertrophy, luminal narrowing and petechiae. Mucosa was edematous , biopsies were taaken to r/o colitis, the abnormalities were confined to 20-30 cm in the sigmoid colon No bleeding was noted from the diverticulosis.  5 mm polyp at 20 cm removed with cold biopsies   Retroflexed views revealed internal hemorrhoids.     The scope was then withdrawn from the patient and the procedure completed.  COMPLICATIONS: There were no complications. ENDOSCOPIC IMPRESSION: There was severe diverticulosis noted in the sigmoid colon Edema and hypertrophy  between 20-30 cm in the sigmoid colon, biopsies taken to r/o segmental colitis 5 mm sigmoid polyp removed from 20 cm  RECOMMENDATIONS: await pathology results low residue diet,  pt allergic to Levsin, may try again bentyl consider segmental sigmoid resection  CT scan abnormalities which refer to the stranding in the left colon may represent chronic state of the sigmoid colon, she is very limited in what antibiotics she can tolerate, surgery may be considered to avoid repaeted courses of antibiotics    REPEAT EXAM: In 5 year(s)  for Colonoscopy.  XB:[id number] Minerva Ends, MD  _______________________________ eSigned:  Hart Carwin, MD 12/20/2011 12:30 PM     PATIENT NAME:  [name], [name] MR#: [id number]

## 2011-12-20 NOTE — Progress Notes (Signed)
Upon arrival to recovery, pt stated that she was nauseated and produced a small amount of yellowish liquid.  States that she feels some better but c/o pain in lower abdomen.

## 2011-12-20 NOTE — Patient Instructions (Addendum)
Severe Diverticulosis in the sigmoid colon.  1 polyp removed.  Biopsies taken to r/o colitis.  See procedure report for additional information.  YOU HAD AN ENDOSCOPIC PROCEDURE TODAY AT THE [facility]: Refer to the procedure report that was given to you for any specific questions about what was found during the examination.  If the procedure report does not answer your questions, please call your gastroenterologist to clarify.  If you requested that your care partner not be given the details of your procedure findings, then the procedure report has been included in a sealed envelope for you to review at your convenience later.  YOU SHOULD EXPECT: Some feelings of bloating in the abdomen. Passage of more gas than usual.  Walking can help get rid of the air that was put into your GI tract during the procedure and reduce the bloating. If you had a lower endoscopy (such as a colonoscopy or flexible sigmoidoscopy) you may notice spotting of blood in your stool or on the toilet paper. If you underwent a bowel prep for your procedure, then you may not have a normal bowel movement for a few days.  DIET: Your first meal following the procedure should be a light meal and then it is ok to progress to your normal diet.  A half-sandwich or bowl of soup is an example of a good first meal.  Heavy or fried foods are harder to digest and may make you feel nauseous or bloated.  Likewise meals heavy in dairy and vegetables can cause extra gas to form and this can also increase the bloating.  Drink plenty of fluids but you should avoid alcoholic beverages for 24 hours.  ACTIVITY: Your care partner should take you home directly after the procedure.  You should plan to take it easy, moving slowly for the rest of the day.  You can resume normal activity the day after the procedure however you should NOT DRIVE or use heavy machinery for 24 hours (because of the sedation medicines used during the test).    SYMPTOMS  TO REPORT IMMEDIATELY: A gastroenterologist can be reached at any hour.  During normal business hours, 8:30 AM to 5:00 PM Monday through Friday, call [phone].  After hours and on weekends, please call the GI answering service at [phone] who will take a message and have the physician on call contact you.   Following lower endoscopy (colonoscopy or flexible sigmoidoscopy):  Excessive amounts of blood in the stool  Significant tenderness or worsening of abdominal pains  Swelling of the abdomen that is new, acute  Fever of 100F or higher  FOLLOW UP: If any biopsies were taken you will be contacted by phone or by letter within the next 1-3 weeks.  Call your gastroenterologist if you have not heard about the biopsies in 3 weeks.  Our staff will call the home number listed on your records the next business day following your procedure to check on you and address any questions or concerns that you may have at that time regarding the information given to you following your procedure. This is a courtesy call and so if there is no answer at the home number and we have not heard from you through the emergency physician on call, we will assume that you have returned to your regular daily activities without incident.  SIGNATURES/CONFIDENTIALITY: You and/or your care partner have signed paperwork which will be entered into your electronic medical record.  These signatures attest to the fact  that that the information above on your After Visit Summary has been reviewed and is understood.  Full responsibility of the confidentiality of this discharge information lies with you and/or your care-partner.

## 2011-12-20 NOTE — Progress Notes (Signed)
1250-Pt turned to right side, knees to chest.  Able to pass medium amt of air.  Still c/o abd pain, as a "7."   1255- Up to BR, passing air.  Still c/o pain.   1310- Pt states, "I've passed air.  I think it's just irritation from the prep.  I'm ready to go home."  Rates abd pain as a "6"

## 2011-12-20 NOTE — Progress Notes (Signed)
Patient did not experience any of the following events: a burn prior to discharge; a fall within the facility; wrong site/side/patient/procedure/implant event; or a hospital transfer or hospital admission upon discharge from the facility. (G8907) Patient did not have preoperative order for IV antibiotic SSI prophylaxis. (G8918)  

## 2011-12-23 ENCOUNTER — Other Ambulatory Visit: Payer: Self-pay | Admitting: Physician Assistant

## 2011-12-23 ENCOUNTER — Other Ambulatory Visit: Payer: Self-pay | Admitting: *Deleted

## 2011-12-23 ENCOUNTER — Telehealth: Payer: Self-pay | Admitting: Internal Medicine

## 2011-12-23 ENCOUNTER — Telehealth: Payer: Self-pay

## 2011-12-23 DIAGNOSIS — K5790 Diverticulosis of intestine, part unspecified, without perforation or abscess without bleeding: Secondary | ICD-10-CM

## 2011-12-23 NOTE — Telephone Encounter (Signed)
Spoke with patient and she would like to schedule the surgery Dr. Juanda Chance recommended as soon as possible.Please, advise.

## 2011-12-23 NOTE — Telephone Encounter (Signed)
She states she does not have a Careers adviser.

## 2011-12-23 NOTE — Telephone Encounter (Signed)
Scheduled barium enema full column at [facility] radiology on 12/27/11 at 9:15/9:30 AM. Patient will need to pick up prep 24 hours prior at least. Referral placed in EPIC for [facility] and left [place] a message. Patient given Dr. Regino Schultze recommendations and date for barium enema and instructions to pick up prep.

## 2011-12-23 NOTE — Telephone Encounter (Signed)
  Follow up Call-  Call back number 12/20/2011  Post procedure Call Back phone  # [phone]  Permission to leave phone message No     Patient questions:  Do you have a fever, pain , or abdominal swelling? no Pain Score  0 *  Have you tolerated food without any problems? yes  Have you been able to return to your normal activities? yes  Do you have any questions about your discharge instructions: Diet   no Medications  no Follow up visit  no  Do you have questions or concerns about your Care? no  Actions: * If pain score is 4 or above: No action needed, pain <4.

## 2011-12-23 NOTE — Telephone Encounter (Signed)
I have told her at colonoscopy, that her sigmoid colon is not functioning and the pain she is having is not always from diverticulitis but from being thickened and not distensible. I told her to come to see me to discuss possible sigmoid resection. I guess she wants to skip the discussion. Please find out if she has a Careers adviser . Also before she sees a Careers adviser, she will need BEnema, full column ( not with air), for the surgeon to know how much colon is involved.Please let me know  the name of a surgeon.

## 2011-12-23 NOTE — Telephone Encounter (Signed)
OK, call Dr Abbey Chatters or Dr Michaell Cowing, Dr Daphine Deutscher, Dr Johna Sheriff, Dr Derrell Lolling if any of them can see her. There is no emergency.

## 2011-12-24 ENCOUNTER — Encounter: Payer: Self-pay | Admitting: Internal Medicine

## 2011-12-24 ENCOUNTER — Telehealth: Payer: Self-pay | Admitting: Internal Medicine

## 2011-12-24 MED ORDER — DICYCLOMINE HCL 10 MG PO CAPS: 10.0000 mg | ORAL_CAPSULE | Freq: Two times a day (BID) | ORAL | Status: DC | PRN

## 2011-12-24 NOTE — Telephone Encounter (Signed)
Advised patient that I will give her a 1 month supply of dicyclomine until she can see the surgeon on 01/07/12. She verbalizes understanding.

## 2011-12-24 NOTE — Telephone Encounter (Signed)
Spoke with Elane Fritz at [facility] and patient is scheduled with Dr. Michaell Cowing on Oct 1 at 1:15/1:45 PM. Patient given date and time of appointment

## 2011-12-26 ENCOUNTER — Encounter: Payer: Self-pay | Admitting: Cardiology

## 2011-12-26 ENCOUNTER — Telehealth: Payer: Self-pay | Admitting: *Deleted

## 2011-12-26 ENCOUNTER — Ambulatory Visit (INDEPENDENT_AMBULATORY_CARE_PROVIDER_SITE_OTHER): Payer: BC Managed Care – PPO | Admitting: Cardiology

## 2011-12-26 VITALS — BP 128/66 | HR 63 | Resp 18 | Ht 61.0 in | Wt 146.0 lb

## 2011-12-26 DIAGNOSIS — I2510 Atherosclerotic heart disease of native coronary artery without angina pectoris: Secondary | ICD-10-CM

## 2011-12-26 DIAGNOSIS — E785 Hyperlipidemia, unspecified: Secondary | ICD-10-CM

## 2011-12-26 NOTE — Patient Instructions (Addendum)
Start aspirin 81mg  daily. Take this with food.   Your physician recommends that you return for a FASTING lipid profile / liver profile/BMET.  Your physician wants you to follow-up in: 1 year with Dr Shirlee Latch. (September 2014).  You will receive a reminder letter in the mail two months in advance. If you don't receive a letter, please call our office to schedule the follow-up appointment.

## 2011-12-26 NOTE — Telephone Encounter (Signed)
Patient wants to have her surgical appointment changed to Dr. Derrell Lolling. States she talked with friends and they recommend him. Spoke with [facility] and rescheduled her to Dr. Derrell Lolling on 01/15/12 9/9:30 AM.

## 2011-12-26 NOTE — Progress Notes (Signed)
Patient ID: [name], female   DOB: [birth year], 71 y.o.   MRN: [id number] PCP: Dr. Alwyn Ren  71 y.o. with history of CAD s/p inferior MI in 1999 and left heart cath in 4/07 with 70% proximal CFX stenosis but normal myoview presents for cardiology followup.  Patient is not on aspirin due to GI intolerance.  At last appointment, I started her on Plavix but she stopped this on her own accord due to easy bruising.  No chest pain.  She is mildly short of breath when walking up a hill.  She can walk up a flight of steps with no problem.  No dyspnea walking on flat ground.  No orthopnea or PND.  She has had episodes of diverticulitis and has chronic LLQ pain.  She is going to be undergoing surgical evaluation for sigmoid colectomy.   ECG: NSR, LAE, nonspecific T wave flattening  Labs (3/12): TSH normal, free T3 low, free T4 normal, LDL 90 Labs (2/13): TGs 255, HDL 43, LDL 84  PMH: 1. CAD: Inferior MI in 1999 treated with PTCA.  Left heart cath in 4/07 showed 70% proximal CFX stenosis with EF 60%.  Myoview was done post-cath and showed no ischemia or infarction.   2. Echo (11/11): EF 60-65%, moderate pulmonic insufficiency. Echo (11/12) with EF 55-60%, normal RV, mild PI.   3. Hyperlipidemia 4. GERD 5. Fibromyalgia 6. C-spine stenosis 7. Migraines 8. IBS 9. Recurrent diverticulitis 10. Essential tremor   Allergies  Allergen Reactions  . Metronidazole     REACTION: SORES IN MOUTH, severe diarrhea  . Penicillins     RASH  . Amphetamine-Dextroamphetamine   . Atorvastatin   . Azithromycin   . Cephalexin   . Chlorpromazine Hcl   . Ciprofloxacin     REACTION: SEVERE PAIN,DEHYDRATION,DIARRHEA  . Doxycycline   . Ezetimibe     REACTION: JOINT/MUSCLE ACHES  . Hyoscyamine Sulfate   . Lactose Intolerance (Gi)   . Lidocaine Swelling  . Sulfamethoxazole     REACTION: GI upset-acid reflux  . Telithromycin   . Topiramate    SH: Married, husband has lung cancer.  3 children.  Never smoked.  Lives  in [place].   FH: Father with MI.   ROS: All systems reviewed and negative except as per HPI.   Current Outpatient Prescriptions  Medication Sig Dispense Refill  . acetaminophen (TYLENOL) 650 MG CR tablet Take 1,300 mg by mouth 2 (two) times daily.      Marland Kitchen buPROPion (WELLBUTRIN XL) 150 MG 24 hr tablet       . Coenzyme Q10 (COQ10) 100 MG CAPS Take 1 capsule by mouth daily.       . cycloSPORINE (RESTASIS) 0.05 % ophthalmic emulsion Place 1 drop into both eyes 2 (two) times daily.      Marland Kitchen DEPAKOTE SPRINKLES 125 MG capsule Take 125 mg by mouth every morning.       . diclofenac (VOLTAREN) 50 MG EC tablet Take 50 mg by mouth daily.       Marland Kitchen dicyclomine (BENTYL) 10 MG capsule Take 1 capsule (10 mg total) by mouth 2 (two) times daily as needed.  60 capsule  0  . donepezil (ARICEPT) 10 MG tablet Take 10 mg by mouth Daily.      . fish oil-omega-3 fatty acids 1000 MG capsule Take 2 g by mouth daily.       . fluticasone (FLONASE) 50 MCG/ACT nasal spray Place 1 spray into the nose 2 (two) times daily as  needed for rhinitis.  16 g  2  . LORazepam (ATIVAN) 0.5 MG tablet Take 0.5 mg by mouth. 1 IN AM, 3 BY MOUTH QHS      . Melatonin 5 MG TABS Take 1 tablet by mouth at bedtime.       Marland Kitchen NEXIUM 40 MG capsule TAKE (1) CAPSULE DAILY.  30 each  5  . OLANZapine (ZYPREXA) 2.5 MG tablet Take 2.5 mg by mouth at bedtime. As directed      . rosuvastatin (CRESTOR) 20 MG tablet Take 1 tablet (20 mg total) by mouth at bedtime.  90 tablet  1  . Alum & Mag Hydroxide-Simeth (MAGIC MOUTHWASH) SOLN Take 5 cc (1 teaspoon) gargled well and swallowed 3 times a day.  10 mL  0  . aspirin EC 81 MG tablet Take 1 tablet (81 mg total) by mouth daily.      . propranolol (INDERAL) 10 MG tablet Take 1 tablet (10 mg total) by mouth 2 (two) times daily.        BP 128/66  Pulse 63  Resp 18  Ht 5\' 1"  (1.549 m)  Wt 146 lb (66.225 kg)  BMI 27.59 kg/m2  SpO2 93% General: NAD Neck: No JVD, no thyromegaly or thyroid nodule.  Lungs:  Clear to auscultation bilaterally with normal respiratory effort. CV: Nondisplaced PMI.  Heart regular S1/S2, no S3/S4, no murmur.  No peripheral edema.  No carotid bruit.  Normal pedal pulses.  Abdomen: Soft, nontender, no hepatosplenomegaly, no distention.  Neurologic: Alert and oriented x 3.  Psych: Normal affect. Extremities: No clubbing or cyanosis.   Assessment/Plan: 1. CAD: Stable with no ischemic symptoms.  She stopped taking Plavix due to bruising.  She had GI intolerance to 325 mg ASA.  I am going to have her take ASA 81 mg daily instead.  She is on Nexium for GI protection.  Continue statin and beta blocker.  2. Hyperlipidemia: Check lipids with goal LDL < 70.  3. Preoperative evaluation: Patient will need a sigmoid colectomy.  She has reasonable exercise tolerance with no ischemic symptoms.  She can undergo surgery with no further testing.  She should continue her beta blocker (propranolol) around the time surgery.    Marca Ancona 12/26/2011

## 2011-12-27 ENCOUNTER — Other Ambulatory Visit (HOSPITAL_COMMUNITY): Payer: BC Managed Care – PPO

## 2011-12-27 NOTE — Telephone Encounter (Signed)
Patient notified of appointment date and time. 

## 2011-12-31 ENCOUNTER — Ambulatory Visit (HOSPITAL_COMMUNITY)
Admission: RE | Admit: 2011-12-31 | Discharge: 2011-12-31 | Disposition: A | Discharge status: Home / Self Care | Payer: BC Managed Care – PPO | Source: Ambulatory Visit | Attending: Internal Medicine | Admitting: Internal Medicine

## 2011-12-31 DIAGNOSIS — K5730 Diverticulosis of large intestine without perforation or abscess without bleeding: Secondary | ICD-10-CM | POA: Insufficient documentation

## 2011-12-31 DIAGNOSIS — K5790 Diverticulosis of intestine, part unspecified, without perforation or abscess without bleeding: Secondary | ICD-10-CM

## 2011-12-31 DIAGNOSIS — K5732 Diverticulitis of large intestine without perforation or abscess without bleeding: Secondary | ICD-10-CM | POA: Insufficient documentation

## 2012-01-01 ENCOUNTER — Encounter: Payer: BC Managed Care – PPO | Admitting: Internal Medicine

## 2012-01-06 ENCOUNTER — Ambulatory Visit (INDEPENDENT_AMBULATORY_CARE_PROVIDER_SITE_OTHER): Payer: BC Managed Care – PPO | Admitting: General Surgery

## 2012-01-07 ENCOUNTER — Other Ambulatory Visit (INDEPENDENT_AMBULATORY_CARE_PROVIDER_SITE_OTHER): Payer: BC Managed Care – PPO

## 2012-01-07 ENCOUNTER — Ambulatory Visit (INDEPENDENT_AMBULATORY_CARE_PROVIDER_SITE_OTHER): Payer: BC Managed Care – PPO | Admitting: Surgery

## 2012-01-07 DIAGNOSIS — E785 Hyperlipidemia, unspecified: Secondary | ICD-10-CM

## 2012-01-07 DIAGNOSIS — I2510 Atherosclerotic heart disease of native coronary artery without angina pectoris: Secondary | ICD-10-CM

## 2012-01-07 LAB — BASIC METABOLIC PANEL
BUN: 17 mg/dL (ref 6–23)
CO2: 27 mEq/L (ref 19–32)
Glucose, Bld: 110 mg/dL — ABNORMAL HIGH (ref 70–99)
Potassium: 3.7 mEq/L (ref 3.5–5.1)
Sodium: 141 mEq/L (ref 135–145)

## 2012-01-07 LAB — HEPATIC FUNCTION PANEL
AST: 21 U/L (ref 0–37)
Bilirubin, Direct: 0 mg/dL (ref 0.0–0.3)
Total Bilirubin: 0.2 mg/dL — ABNORMAL LOW (ref 0.3–1.2)

## 2012-01-07 LAB — LDL CHOLESTEROL, DIRECT: Direct LDL: 67.9 mg/dL

## 2012-01-07 LAB — LIPID PANEL
HDL: 30.3 mg/dL — ABNORMAL LOW (ref >39.00)
VLDL: 40.6 mg/dL — ABNORMAL HIGH (ref 0.0–40.0)

## 2012-01-15 ENCOUNTER — Ambulatory Visit (INDEPENDENT_AMBULATORY_CARE_PROVIDER_SITE_OTHER): Payer: BC Managed Care – PPO | Admitting: Surgery

## 2012-01-21 ENCOUNTER — Ambulatory Visit (INDEPENDENT_AMBULATORY_CARE_PROVIDER_SITE_OTHER): Payer: BC Managed Care – PPO | Admitting: General Surgery

## 2012-01-21 ENCOUNTER — Encounter (INDEPENDENT_AMBULATORY_CARE_PROVIDER_SITE_OTHER): Payer: Self-pay | Admitting: General Surgery

## 2012-01-21 VITALS — BP 130/68 | HR 70 | Temp 96.9°F | Resp 16 | Ht 61.0 in | Wt 142.0 lb

## 2012-01-21 DIAGNOSIS — K5792 Diverticulitis of intestine, part unspecified, without perforation or abscess without bleeding: Secondary | ICD-10-CM

## 2012-01-21 DIAGNOSIS — K5732 Diverticulitis of large intestine without perforation or abscess without bleeding: Secondary | ICD-10-CM

## 2012-01-21 NOTE — Progress Notes (Signed)
Patient ID: [name], female   DOB: 25-Apr-1940, 71 y.o.   MRN: [id number]  Chief Complaint  Patient presents with  . Diverticulitis    HPI [name] is a 71 y.o. female.  She is referred to me by Dr. Lina Walls for consideration of sigmoid colectomy because of recurrent diverticulitis.  This woman has had irritable bowel syndrome for 3-4 years. With diarrhea alternating with normal bowel movements.She has had episodes of diverticulitis requiring outpatient treatment with antibiotics. She thinks she has had at least 6 episodes. Her symptoms have been worse over the past 4 months with pain and diarrhea. She says the pain is in her lower abdomen. CT scan in September 2012 shows inflammatory changes in the sigmoid colon. Colonoscopy in 2010 shows tortuosity and numerous diverticula of the sigmoid luminal narrowing. Apparently a repeat colonoscopy was done last month which showed precancerous polyps and luminal narrowing in the sigmoid. CT scan 10/11/2001 shows mild inflammatory changes she was treated with antibiotics. Followup CT scan on 11/07/2011 shows mild inflammatory changes. Because of diarrhea a C. Difficile was checked and was negative. She's had a barium enema recently which shows numerous diverticula of the sigmoid colon extending slightly into the distal descending colon with luminal narrowing and tortuosity. Proximally the colon looks pretty good.  Because of accelerating symptoms and frustration she was referred for consideration of sigmoid colectomy she would like to have that done. She is here with her husband today.  Past history is significant for myocardial infarction 1999 treated  with PTCA. Myoview in 2007 was negative. Recent evaluation by Dr. Marca Walls Was done and he feels she is an acceptable cardiac risk for sigmoid colon resection. Ejection fraction 60-65%. She also had carried a diagnosis of cervical spinal stenosis and fibromyalgia. Surgically she had some type or  procedure at the [facility] in [place] for ptosis of internal organs with the Mersilene mesh sling. She's also had a rectocele repair and a bladder sling and cataracts. She had a vaginal hysterectomy. Apparently she's only had 1 operation on her abdomen which was the Mersilene sling. HPI  Past Medical History  Diagnosis Date  . Abnormal weight gain   . Unspecified adverse effect of unspecified drug, medicinal and biological substance   . Migraine, unspecified, without mention of intractable migraine without mention of status migrainosus   . Abdominal bruit   . Allergic rhinitis, cause unspecified   . Dehydration   . Acute myocardial infarction of anterolateral wall, subsequent episode of care   . Diverticular disease     acute diverticulitis 10/13/11  . CAD (coronary artery disease)     diaphragmatic wall infarction in 1999, treated with balloon angioplasty with nonobstructive disease at ast catheterization in 2007 as described above  . Hyperlipemia     with low HDL.  . IBS (irritable bowel syndrome)   . GERD (gastroesophageal reflux disease)   . Fibromyalgia   . Aspirin allergy     INTOLERANCE ALSO TO PLAVIX  . Diabetes mellitus     Diet Control    Past Surgical History  Procedure Date  . Abdominal surgery      @ [facility]  in [place] in 2002  for ptosis of organs; mersilene mesh sling, cystocopy  by Dr  [name], Urologist  . Colonoscopy 2011    Diverticulosis & hemorrhoids; Dr [name]  . Tonsillectomy   . Vagiocoele     RECTOCOELE  . Bladder surgery patient unsure of date  sling  . Cataract extraction     left eye  . Partial hysterectomy   . Abdominal hysterectomy 1979    partial    Family History  Problem Relation Age of Onset  . Heart attack Brother 62    1/2 BROTHER  . Bipolar disorder Sister   . Heart attack Sister 61  . Heart attack Father 32  . Ulcers Father   . Stroke Sister     1/2 SISTER  . Alcohol abuse Mother   . Colon cancer Neg Hx     Social  History History  Substance Use Topics  . Smoking status: Never Smoker   . Smokeless tobacco: Never Used   Comment: second hand smoke for decades  . Alcohol Use: No    Allergies  Allergen Reactions  . Metronidazole     REACTION: SORES IN MOUTH, severe diarrhea  . Penicillins     RASH  . Amphetamine-Dextroamphetamine   . Atorvastatin   . Azithromycin   . Cephalexin   . Chlorpromazine Hcl   . Ciprofloxacin     REACTION: SEVERE PAIN,DEHYDRATION,DIARRHEA  . Doxycycline   . Ezetimibe     REACTION: JOINT/MUSCLE ACHES  . Hyoscyamine Sulfate   . Lactose Intolerance (Gi)   . Lidocaine Swelling  . Sulfamethoxazole     REACTION: GI upset-acid reflux  . Telithromycin   . Topiramate     Current Outpatient Prescriptions  Medication Sig Dispense Refill  . acetaminophen (TYLENOL) 650 MG CR tablet Take 1,300 mg by mouth 2 (two) times daily.      . Alum & Mag Hydroxide-Simeth (MAGIC MOUTHWASH) SOLN Take 5 cc (1 teaspoon) gargled well and swallowed 3 times a day.  10 mL  0  . aspirin EC 81 MG tablet Take 1 tablet (81 mg total) by mouth daily.      [name] buPROPion (WELLBUTRIN XL) 150 MG 24 hr tablet       . Coenzyme Q10 (COQ10) 100 MG CAPS Take 1 capsule by mouth daily.       . cycloSPORINE (RESTASIS) 0.05 % ophthalmic emulsion Place 1 drop into both eyes 2 (two) times daily.      [name] DEPAKOTE SPRINKLES 125 MG capsule Take 125 mg by mouth every morning.       . diclofenac (VOLTAREN) 50 MG EC tablet Take 50 mg by mouth daily.       [name] dicyclomine (BENTYL) 10 MG capsule Take 1 capsule (10 mg total) by mouth 2 (two) times daily as needed.  60 capsule  0  . donepezil (ARICEPT) 10 MG tablet Take 10 mg by mouth Daily.      . fish oil-omega-3 fatty acids 1000 MG capsule Take 2 g by mouth daily.       . fluticasone (FLONASE) 50 MCG/ACT nasal spray Place 1 spray into the nose 2 (two) times daily as needed for rhinitis.  16 g  2  . LORazepam (ATIVAN) 0.5 MG tablet Take 0.5 mg by mouth. 1 IN AM, 3 BY MOUTH  QHS      . Melatonin 5 MG TABS Take 1 tablet by mouth at bedtime.       [name] NEXIUM 40 MG capsule TAKE (1) CAPSULE DAILY.  30 each  5  . OLANZapine (ZYPREXA) 2.5 MG tablet Take 2.5 mg by mouth at bedtime. As directed      . propranolol (INDERAL) 10 MG tablet Take 10 mg by mouth 3 (three) times daily.       [name]  rosuvastatin (CRESTOR) 20 MG tablet Take 1 tablet (20 mg total) by mouth at bedtime.  90 tablet  1    Review of Systems Review of Systems  Constitutional: Positive for unexpected weight change. Negative for fever and chills.       18 pound weight loss in 6 months.  HENT: Positive for neck pain and neck stiffness. Negative for hearing loss, congestion, sore throat, trouble swallowing and voice change.   Eyes: Negative for visual disturbance.  Respiratory: Negative for cough and wheezing.   Cardiovascular: Negative for chest pain, palpitations and leg swelling.  Gastrointestinal: Positive for abdominal pain and diarrhea. Negative for nausea, vomiting, constipation, blood in stool, abdominal distention and anal bleeding.  Genitourinary: Negative for hematuria, vaginal bleeding and difficulty urinating.  Musculoskeletal: Negative for arthralgias.  Skin: Negative for rash and wound.  Neurological: Negative for seizures, syncope and headaches.  Hematological: Negative for adenopathy. Does not bruise/bleed easily.  Psychiatric/Behavioral: Negative for confusion. The patient is nervous/anxious.     Blood pressure 130/68, pulse 70, temperature 96.9 F (36.1 C), temperature source Temporal, resp. rate 16, height 5\' 1"  (1.549 m), weight 142 lb (64.411 kg).  Physical Exam Physical Exam  Constitutional: She is oriented to person, place, and time. She appears well-developed and well-nourished. No distress.  HENT:  Head: Normocephalic and atraumatic.  Nose: Nose normal.  Mouth/Throat: No oropharyngeal exudate.  Eyes: Conjunctivae normal and EOM are normal. Pupils are equal, round, and reactive  to light. Left eye exhibits no discharge. No scleral icterus.  Neck: Neck supple. No JVD present. No tracheal deviation present. No thyromegaly present.  Cardiovascular: Normal rate, regular rhythm, normal heart sounds and intact distal pulses.   No murmur heard. Pulmonary/Chest: Effort normal and breath sounds normal. No respiratory distress. She has no wheezes. She has no rales. She exhibits no tenderness.  Abdominal: Soft. Bowel sounds are normal. She exhibits no distension and no mass. There is tenderness. There is no rebound and no guarding.         Subjectively she is a little tender in the lower abdomen, left greater than right, but there is no mass and no guarding and no rebound. Lower midline scar and a slight transverse scar in the suprapubic area. No hernia  Musculoskeletal: She exhibits no edema and no tenderness.  Lymphadenopathy:    She has no cervical adenopathy.  Neurological: She is alert and oriented to person, place, and time. She exhibits normal muscle tone. Coordination normal.  Skin: Skin is warm. No rash noted. She is not diaphoretic. No erythema. No pallor.  Psychiatric: Her behavior is normal. Judgment and thought content normal.       She is nervous and a little bit tremulous.  she admits to being more anxious today than usual.    Data Reviewed I have reviewed her colonoscopy report, CT scans, barium enema, recent GI evaluation, and recent cardiac evaluation.  Assessment    Accelerating bouts of sigmoid diverticulitis, complicated by luminal narrowing and tortuosity of the colon. She is at increased risk for complications including obstruction, abscess, and perforation. Elective  sigmoid colectomy is appropriate in this setting.  Coronary artery disease, stable. History MI in 1999. Preserved left ventricular function  History laparotomy and Mersilene mesh sling at [facility], details unknown  History bladder suspension   History rectocele repair  History of  vaginal hysterectomy  Anxiety depression  Cervical spine stenosis  Fibromyalgia  GERD    Plan    I had a long  discussion with the patient and her husband regarding the diagnosis of diverticulitis, future risks and natural history of the disease, indications for surgery, techniques for surgery, risks of surgery, temporary disabilities and so forth. She seems to understand these issues and expresses desire to have elective sigmoid colectomy. Her husband agrees  She will be scheduled for elective laparoscopic-assisted sigmoid colectomy, possible open. She is aware this may have to be done open if adhesions from her previous operation are bad. She is also aware the splenic flexure will be mobilized.  Discontinue aspirin 5 days preop  She'll undergo bowel prep at home  Diet and hydration and use of MiraLAX or discussed  I discussed the indications, details, techniques, and numerous risks of the surgery with the patient and her husband. She is aware that she may need a temporary diverting loop ileostomy. She is aware of the complications of bowel injury and anastomotic leak as well as numerous other risks. She understands these issues. Her questions were answered. She agrees with this plan.       Angelia Mould. Derrell Lolling, M.D., [facility] General and Minimally invasive Surgery Breast and Colorectal Surgery Office:   [phone] Pager:   [phone]  01/21/2012, 3:42 PM

## 2012-01-21 NOTE — Patient Instructions (Signed)
You will be scheduled for an operation: Laparoscopic assisted sigmoid colectomy, possible open.  You will be given a bowel prep to take the day before surgery.  Discontinue aspirin 5 days preop  Go on a  liquid diet 2 days before surgery.  In the meantime follow a  high-fiber, low-fat diet and drink lots of extra water. Take MiraLAX if necessary to keep her bowels moving freely.  Read over the patient information booklet that I gave you.

## 2012-01-30 ENCOUNTER — Ambulatory Visit (INDEPENDENT_AMBULATORY_CARE_PROVIDER_SITE_OTHER): Payer: BC Managed Care – PPO | Admitting: General Surgery

## 2012-02-11 ENCOUNTER — Telehealth: Payer: Self-pay | Admitting: Internal Medicine

## 2012-02-11 ENCOUNTER — Other Ambulatory Visit: Payer: Self-pay | Admitting: Cardiology

## 2012-02-11 NOTE — Telephone Encounter (Signed)
Patient needs an appointment for 02/12/12 - Please see note below. Need to call her @ [phone]. Thank You.   Caller: Monisha/Patient; Patient Name: [name]; PCP: Marga Melnick; Best Callback Phone Number: [phone]. 02/11/12 - Patient has been weak for several weeks, tremor has gotten worse and so has her coordiantion. Afebrile. Patient is seeing a Psychiatrist for some of the issues, but wants to see Dr. Alwyn Ren to see if anything else is going on. Patient refused Triage and wants to be seen at the office tomorrow. Unable to find an appropriate office appointment with Dr. Alwyn Ren. Advised that the office would call her back to make the appointment for her. 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

## 2012-02-11 NOTE — Telephone Encounter (Signed)
Please contact patient and offer her one of our same day slots for tomorrow. IF neither time fits patient's schedule please offer appointment for Thursday (ok to use same day slot) to be of the best benefit to our patient.

## 2012-02-11 NOTE — Telephone Encounter (Signed)
Called both numbers listed, no answer.

## 2012-02-12 ENCOUNTER — Ambulatory Visit (INDEPENDENT_AMBULATORY_CARE_PROVIDER_SITE_OTHER): Payer: BC Managed Care – PPO | Admitting: Internal Medicine

## 2012-02-12 ENCOUNTER — Encounter: Payer: Self-pay | Admitting: Internal Medicine

## 2012-02-12 VITALS — BP 110/66 | HR 51 | Temp 97.8°F | Wt 146.0 lb

## 2012-02-12 DIAGNOSIS — G252 Other specified forms of tremor: Secondary | ICD-10-CM

## 2012-02-12 DIAGNOSIS — G259 Extrapyramidal and movement disorder, unspecified: Secondary | ICD-10-CM

## 2012-02-12 DIAGNOSIS — R5381 Other malaise: Secondary | ICD-10-CM

## 2012-02-12 DIAGNOSIS — R7309 Other abnormal glucose: Secondary | ICD-10-CM

## 2012-02-12 DIAGNOSIS — G250 Essential tremor: Secondary | ICD-10-CM

## 2012-02-12 DIAGNOSIS — R531 Weakness: Secondary | ICD-10-CM

## 2012-02-12 DIAGNOSIS — R5383 Other fatigue: Secondary | ICD-10-CM

## 2012-02-12 LAB — CBC WITH DIFFERENTIAL/PLATELET
Basophils Relative: 0.2 % (ref 0.0–3.0)
Eosinophils Absolute: 0.1 10*3/uL (ref 0.0–0.7)
HCT: 39.9 % (ref 36.0–46.0)
Hemoglobin: 13.1 g/dL (ref 12.0–15.0)
Lymphs Abs: 2.5 10*3/uL (ref 0.7–4.0)
MCHC: 32.9 g/dL (ref 30.0–36.0)
MCV: 97.8 fl (ref 78.0–100.0)
Monocytes Absolute: 1 10*3/uL (ref 0.1–1.0)
Neutro Abs: 8 10*3/uL — ABNORMAL HIGH (ref 1.4–7.7)
RBC: 4.08 Mil/uL (ref 3.87–5.11)

## 2012-02-12 LAB — HEMOGLOBIN A1C: Hgb A1c MFr Bld: 5.4 % (ref 4.6–6.5)

## 2012-02-12 NOTE — Patient Instructions (Addendum)
Review and correct the record as indicated. Please share record with all medical staff seen.   If you activate My Chart; the results can be released to you as soon as they populate from the lab. If you choose not to use this program; the labs have to be reviewed, copied & mailed   causing a delay in getting the results to you.  

## 2012-02-12 NOTE — Telephone Encounter (Signed)
Patient scheduled for 1:00p today.

## 2012-02-12 NOTE — Progress Notes (Signed)
  Subjective:    Patient ID: [name], female    DOB: 1940-12-18, 71 y.o.   MRN: [id number]  HPI  For 3 weeks she describes diffuse weakness with imbalance and discoordination. It's severe enough that she has difficulty feeding herself or even brushing her teeth. She has a history of "essential tremor" for which she saw [name]. She is seeing Dr. Jennelle Walls, psychiatrist who has her on multiple agents. She states that regimen has helped improve the tremor and has been effective in treating insomnia.  Her most recent labs were 01/07/12; glucose was 110, shortness rest 203, HDL 30.3,    Review of Systems Sigmoid colectomy is  scheduled for 02/26/12; she has had accelerated bouts of diverticulitis. This has been associated with luminal narrowing and tortuosity. [name] surgical evaluation was reviewed     Objective:   Physical Exam She appears adequately nourished & in no distress. She appears younger than her stated age  She exhibits some facial masking; she does have some tremor of the lip.  She has no lymphadenopathy about the neck or axilla.  Rhythm is regular; she has a grade 1 systolic murmur  There is no increased work of breathing.  There is increased tone without true cogwheeling.  Deep tendon reflexes are equal.  She exhibits a resting tremor of the left upper extremity which has somewhat of a pill-rolling character to it.  Her gait is somewhat choppy; she turns slowly  There is an accentuated curvature over the upper thoracic spine.  Affect is flat; she is oriented x3.        Assessment & Plan:  #1 weakness, diffuse  #2 discoordination; movement disorder versus drug effects of polypharmacy must be assessed.  #3 recurrent diverticulitis with luminal narrowing; sigmoid colectomy plan 11/20.  Plan: selected labs will be rechecked. Consultation with a movement disorder specialist is recommended as soon as possible because of the impending surgery.

## 2012-02-13 ENCOUNTER — Telehealth: Payer: Self-pay

## 2012-02-13 NOTE — Telephone Encounter (Signed)
Spoke with patient's daughter, gave number to MyChart. Per Dr.Hopper the Neurologist will help with movement, patient's daughter verbalized understanding.  Patient's daughter also noted that patient appears to be worse today than yesterday.

## 2012-02-13 NOTE — Telephone Encounter (Signed)
Pt daughter called left message on triage line requesting help with activation for my chart. Pt also asked was Hop able to make appt for pt to see movement specialist?  Plz advise     MW

## 2012-02-18 ENCOUNTER — Ambulatory Visit (INDEPENDENT_AMBULATORY_CARE_PROVIDER_SITE_OTHER): Payer: BC Managed Care – PPO | Admitting: Neurology

## 2012-02-18 ENCOUNTER — Encounter: Payer: Self-pay | Admitting: Neurology

## 2012-02-18 VITALS — BP 118/70 | HR 58 | Temp 97.9°F | Resp 16 | Wt 137.0 lb

## 2012-02-18 DIAGNOSIS — F329 Major depressive disorder, single episode, unspecified: Secondary | ICD-10-CM

## 2012-02-18 DIAGNOSIS — G219 Secondary parkinsonism, unspecified: Secondary | ICD-10-CM

## 2012-02-18 DIAGNOSIS — T43591A Poisoning by other antipsychotics and neuroleptics, accidental (unintentional), initial encounter: Secondary | ICD-10-CM

## 2012-02-18 DIAGNOSIS — G2401 Drug induced subacute dyskinesia: Secondary | ICD-10-CM

## 2012-02-18 NOTE — Patient Instructions (Signed)
1.  If you get worse, I need you to call me back 2.  If you get better but not completely better, I need to see you in 6 months

## 2012-02-18 NOTE — Progress Notes (Signed)
[name] was seen today in the movement disorders clinic for neurologic consultation at the request of Marga Melnick, MD.   This patient is accompanied in the office by her spouse who supplements the history.  The consultation is for the evaluation of tremor.  The patient has seen Dr. Sandria Manly in the past.  She was diagnosed with essential tremor.  She last saw Dr. Sandria Manly a few years ago.  She was also seeing Dr. Sandria Manly for migraine and she was getting VPA for prophylaxis.  She sees Dr. Jennelle Human as well and he began writing the RX for VPA so she quit seeing Dr Sandria Manly.  She has been on the VPA for about 6 years.  Zyprexa was added about 6 months ago for dx of depression.  She thinks that it has been helpful for mood but apparently her clinical situation has greatly changed over the last few months.   The patient reports that tremor has gotten much worse.  It shakes more than in the past it was only on the L side.  Now, it is in her jaw and the "rest" of the body.  In addtion, she has become unsteady.  She is anxious about the surgery and her husband thinks that it contributes.  she has been on propranolol since September 2013, also prescribed by Dr. Jennelle Human for "mood" per patient.  The patient is scheduled to have a colectomy next week.    Specific Symptoms:  Tremor: yes, as above Voice: speech is slurry and soft Sleep: trouble initiating and maintaining sleep  Vivid Dreams:  no  Acting out dreams:  no Wet Pillows: yes Postural symptoms:  yes  Falls?  yes, just one a few weeks ago.  She was turning and tripped over feet. Bradykinesia symptoms: slurred or difficult speech, slow and small handwriting and difficulty getting out of a chair, shuffling gait per husband Loss of smell:  no Loss of taste:  no Urinary Incontinence:  nov Difficulty Swallowing:  no Handwriting, micrographia: yes Trouble with ADL's:  yes  Trouble buttoning clothing: yes, on occasion Depression:  yes Memory changes:  yes, both  short and long term.  Still drives.  Able to drive and distribute meds Hallucinations:  no  visual distortions: no N/V:  no Lightheaded:  no  Syncope: no Diplopia:  no  Neuroimaging has previously been performed.  It is unavailable for my review today but the patient reports that this was last done greater than 10 years ago.  PREVIOUS MEDICATIONS: none to date  ALLERGIES:   Allergies  Allergen Reactions  . Metronidazole     REACTION: SORES IN MOUTH, severe diarrhea  . Penicillins     RASH  . Amphetamine-Dextroamphetamine   . Atorvastatin   . Azithromycin   . Cephalexin   . Chlorpromazine Hcl   . Ciprofloxacin     REACTION: SEVERE PAIN,DEHYDRATION,DIARRHEA  . Doxycycline   . Ezetimibe     REACTION: JOINT/MUSCLE ACHES  . Hyoscyamine Sulfate   . Lactose Intolerance (Gi)   . Lidocaine Swelling  . Sulfamethoxazole     REACTION: GI upset-acid reflux  . Telithromycin   . Topiramate     CURRENT MEDICATIONS:  Current Outpatient Prescriptions on File Prior to Visit  Medication Sig Dispense Refill  . acetaminophen (TYLENOL) 650 MG CR tablet Take 1,300 mg by mouth 2 (two) times daily.      Marland Kitchen aspirin EC 81 MG tablet Take 1 tablet (81 mg total) by mouth daily.      Marland Kitchen  buPROPion (WELLBUTRIN XL) 150 MG 24 hr tablet Take 150 mg by mouth daily.       . clonazePAM (KLONOPIN) 0.5 MG tablet Take 0.5 mg by mouth. 1 BY MOUTH TWICE DAILY AND 1 AT BEDTIME      . cloNIDine (CATAPRES) 0.1 MG tablet Take 0.1 mg by mouth. 1 BY MOUTH IN THE AM, 2 BY MOUTH IN THE PM      . Coenzyme Q10 (COQ10) 100 MG CAPS Take 1 capsule by mouth daily.       . CRESTOR 20 MG tablet TAKE ONE TABLET AT BEDTIME.  90 tablet  3  . DEPAKOTE SPRINKLES 125 MG capsule Take 125 mg by mouth every morning.       . diclofenac (VOLTAREN) 50 MG EC tablet Take 50 mg by mouth as needed.       . dicyclomine (BENTYL) 10 MG capsule Take 1 capsule (10 mg total) by mouth 2 (two) times daily as needed.  60 capsule  0  . fish oil-omega-3  fatty acids 1000 MG capsule Take 2 g by mouth daily.       . fluticasone (FLONASE) 50 MCG/ACT nasal spray Place 1 spray into the nose 2 (two) times daily as needed for rhinitis.  16 g  2  . Melatonin 5 MG TABS Take 1 tablet by mouth at bedtime.       Marland Kitchen NEXIUM 40 MG capsule TAKE (1) CAPSULE DAILY.  30 each  5  . OLANZapine (ZYPREXA) 15 MG tablet Take 15 mg by mouth. 1/2 BY MOUTH AT BEDTIME, DR.COTTLE      . propranolol (INDERAL) 10 MG tablet Take 10 mg by mouth 3 (three) times daily.         PAST MEDICAL HISTORY:   Past Medical History  Diagnosis Date  . Abnormal weight gain   . Unspecified adverse effect of unspecified drug, medicinal and biological substance   . Migraine, unspecified, without mention of intractable migraine without mention of status migrainosus   . Abdominal bruit   . Allergic rhinitis, cause unspecified   . Dehydration   . Acute myocardial infarction of anterolateral wall, subsequent episode of care   . Diverticular disease     acute diverticulitis 10/13/11  . CAD (coronary artery disease)     diaphragmatic wall infarction in 1999, treated with balloon angioplasty with nonobstructive disease at ast catheterization in 2007 as described above  . Hyperlipemia     with low HDL.  . IBS (irritable bowel syndrome)   . GERD (gastroesophageal reflux disease)   . Fibromyalgia   . Aspirin allergy     INTOLERANCE ALSO TO PLAVIX  . Diabetes mellitus     Diet Control  . Tremor     PAST SURGICAL HISTORY:   Past Surgical History  Procedure Date  . Abdominal surgery      @ [facility]  in [place] in 2002  for ptosis of organs; mersilene mesh sling, cystocopy  by Dr  Earlene Plater, Urologist  . Colonoscopy 2011    Diverticulosis & hemorrhoids; Dr Juanda Chance  . Tonsillectomy   . Vagiocoele     RECTOCOELE  . Bladder surgery patient unsure of date    sling  . Cataract extraction     left eye  . Partial hysterectomy   . Abdominal hysterectomy 1979    partial    SOCIAL HISTORY:   History    Social History  . Marital Status: Married    Spouse Name: N/A  Number of Children: N/A  . Years of Education: N/A   Occupational History  . Retired Runner, broadcasting/film/video    Social History Main Topics  . Smoking status: Never Smoker   . Smokeless tobacco: Never Used     Comment: second hand smoke for decades  . Alcohol Use: No  . Drug Use: No  . Sexually Active: Not on file   Other Topics Concern  . Not on file   Social History Narrative   High fiber diet    FAMILY HISTORY:   Family Status  Relation Status Death Age  . Father Deceased 55    MI  . Mother Deceased 96    COPD  . Son      SEEING dR.mCKINNEY FOR MENTAL HEALTH ISSUES  . Brother Deceased     "brain tumor"  . Brother Alive     healthy  . Daughter Alive     2, alive and well    ROS:  A complete 10 system review of systems was obtained and was unremarkable apart from what is mentioned above.  PHYSICAL EXAMINATION:    VITALS:   Filed Vitals:   02/18/12 1032  BP: 118/70  Pulse: 58  Temp: 97.9 F (36.6 C)  Resp: 16  Weight: 137 lb (62.143 kg)    GEN:  The patient appears stated age and is in NAD.  Her affect is very flat. HEENT:  Normocephalic, atraumatic.  The mucous membranes are moist. The superficial temporal arteries are without ropiness or tenderness. CV:  RRR Lungs:  CTAB Neck/HEME:  There are no carotid bruits bilaterally.  Neurological examination:  Orientation: The patient is alert and oriented x3. Fund of knowledge is appropriate.  Recent and remote memory are intact.  Attention and concentration are normal.    Able to name objects and repeat phrases. Cranial nerves: There is good facial symmetry. There is significant facial hypomimia.  Pupils are equal round and reactive to light bilaterally. Fundoscopic exam is attempted but the disc margins are not well visualized bilaterally. Extraocular muscles are intac tbut there is mild upgaze paresis. The visual fields are full to confrontational  testing. The speech is fluent and clear. The patient is able to make the gutteral sounds without difficulty.  Soft palate rises symmetrically and there is no tongue deviation. Hearing is intact to conversational tone. Sensation: Sensation is intact to light and pinprick throughout (facial, trunk, extremities). Vibration is intact at the bilateral big toe. There is no extinction with double simultaneous stimulation. There is no sensory dermatomal level identified. Motor: Strength is 5/5 in the bilateral upper and lower extremities.   Shoulder shrug is equal and symmetric.  There is no pronator drift. Deep tendon reflexes: Deep tendon reflexes are 2/4 at the bilateral biceps, triceps, brachioradialis, patella and achilles. Plantar responses are downgoing bilaterally.  Movement examination: Tone: There is increased tone in the bilateral upper extremities, right greater than left.   There is moderate rigidity in the right upper extremity that increases with activation.  There is mild rigidity in the left upper extremity.  The tone in the lower extremities is normal. Abnormal movements: there is a left upper extremity resting tremor.  This increases with ambulation.  There is a jaw tremor and evidence of orobuccolingual dyskinesia. Coordination:  There is definite decremation with RAM's, including with alternating supination and pronation of the forearm, hand opening and closing, finger taps, heel taps and toe taps. Gait and Station: The patient has no difficulty arising out of  a deep-seated chair without the use of the hands. The patient's stride length is decreased.  The patient has decreased arm swing bilaterally and increase in left upper extremity resting tremor with ambulation.  The patient has a positive pull test.      ASSESSMENT:   1.  Parkinsonism.  I suspect that this is all due to the Zyprexa.  I did tell her that we cannot be 100% positive that she does not have Parkinson's disease because she  is on Zyprexa, but the time course would suggest that this is due to antipsychotic medication. 2.  Mild and early tardive dyskinesia. 3.  History of essential tremor. 4.  History of migraine.  PLAN:   1.  I talked to the patient about the fact that both Depakote and Zyprexa can cause tremor, but the Zyprexa would be the medication that is causing the rigidity and bradykinesia and likely a resting tremor as well.  If she is able and her psychiatrist is agreeable, then I would recommend changing the Zyprexa to Seroquel.  she has an appointment with her psychiatrist today and I wrote her a letter to take to him. 2.  I did tell the patient that if the parkinsonism is from Zyprexa, and if the Zyprexa is removed and she continues to have this stiffness and tremor 6 months after the medication is discontinued, then I will need to see her back for a reevaluation.  However, it is possible that the jaw tremor and slight tongue movement will continue because tardive dyskinesia can continue even in the absence of the medication. 3.  We discussed the diagnosis as well as pathophysiology of the disease.  We discussed treatment options as well as prognostic indicators.  Patient education was provided. 4.  Time in room was 65 min with greater than 50% of the visit in counseling, as above. 5.  Return in about 6 months (around 08/17/2012).

## 2012-02-21 ENCOUNTER — Encounter (HOSPITAL_COMMUNITY): Payer: Self-pay | Admitting: Pharmacy Technician

## 2012-02-21 ENCOUNTER — Other Ambulatory Visit (HOSPITAL_COMMUNITY): Payer: Self-pay | Admitting: General Surgery

## 2012-02-21 NOTE — Progress Notes (Signed)
12-20-11 EKG in Epic

## 2012-02-24 ENCOUNTER — Encounter (HOSPITAL_COMMUNITY)
Admission: RE | Admit: 2012-02-24 | Discharge: 2012-02-24 | Disposition: A | Discharge status: Home / Self Care | Payer: BC Managed Care – PPO | Source: Ambulatory Visit | Attending: General Surgery | Admitting: General Surgery

## 2012-02-24 ENCOUNTER — Encounter (HOSPITAL_COMMUNITY): Payer: Self-pay

## 2012-02-24 ENCOUNTER — Ambulatory Visit (HOSPITAL_COMMUNITY)
Admission: RE | Admit: 2012-02-24 | Discharge: 2012-02-24 | Disposition: A | Discharge status: Home / Self Care | Payer: BC Managed Care – PPO | Source: Ambulatory Visit | Attending: General Surgery | Admitting: General Surgery

## 2012-02-24 DIAGNOSIS — Z01818 Encounter for other preprocedural examination: Secondary | ICD-10-CM | POA: Insufficient documentation

## 2012-02-24 HISTORY — DX: Depression, unspecified: F32.A

## 2012-02-24 HISTORY — DX: Major depressive disorder, single episode, unspecified: F32.9

## 2012-02-24 LAB — COMPREHENSIVE METABOLIC PANEL
ALT: 19 U/L (ref 0–35)
AST: 21 U/L (ref 0–37)
Alkaline Phosphatase: 89 U/L (ref 39–117)
CO2: 27 mEq/L (ref 19–32)
Calcium: 9.9 mg/dL (ref 8.4–10.5)
Chloride: 99 mEq/L (ref 96–112)
GFR calc non Af Amer: 53 mL/min — ABNORMAL LOW (ref >90)
Potassium: 3.7 mEq/L (ref 3.5–5.1)
Sodium: 137 mEq/L (ref 135–145)

## 2012-02-24 LAB — SURGICAL PCR SCREEN: Staphylococcus aureus: NEGATIVE

## 2012-02-24 LAB — CBC WITH DIFFERENTIAL/PLATELET
Basophils Absolute: 0 10*3/uL (ref 0.0–0.1)
Lymphocytes Relative: 35 % (ref 12–46)
Lymphs Abs: 2.2 10*3/uL (ref 0.7–4.0)
Neutro Abs: 3.3 10*3/uL (ref 1.7–7.7)
Neutrophils Relative %: 52 % (ref 43–77)
Platelets: 223 10*3/uL (ref 150–400)
RBC: 4.51 MIL/uL (ref 3.87–5.11)
RDW: 13 % (ref 11.5–15.5)
WBC: 6.3 10*3/uL (ref 4.0–10.5)

## 2012-02-24 LAB — ABO/RH: ABO/RH(D): O POS

## 2012-02-24 LAB — URINALYSIS, ROUTINE W REFLEX MICROSCOPIC
Hgb urine dipstick: NEGATIVE
Ketones, ur: 15 mg/dL — AB
Specific Gravity, Urine: 1.035 — ABNORMAL HIGH (ref 1.005–1.030)
Urobilinogen, UA: 0.2 mg/dL (ref 0.0–1.0)

## 2012-02-24 NOTE — Progress Notes (Signed)
ua results routed to dr Derrell Lolling by epic

## 2012-02-24 NOTE — Patient Instructions (Signed)
20 KIAMESHA SAMET  02/24/2012   Your procedure is scheduled on:  02-26-2012  Report to [facility] at 0630 AM.  Call this number if you have problems the morning of surgery: [phone]   Remember:   Do not eat food or drink liquids:After Midnight.    Take these medicines the morning of surgery with A SIP OF WATER: wellbutrin, klonopin, clonidine, aricept, claritin, proponol   Do not wear jewelry or make up.  Do not wear lotions, powders, or perfumes. You may wear deodorant.    Do not bring valuables to the hospital.  Contacts, dentures or bridgework may not be worn into surgery.  Leave suitcase in the car. After surgery it may be brought to your room.  For patients admitted to the hospital, checkout time is 11:00 AM the day of discharge                             Patients discharged the day of surgery will not be allowed to drive home. If going home same day of surgery, you must have someone stay with you the first 24 hours at home and arrange for some one to drive you home from hospital.    Special Instructions: See [facility] Preparing for Surgery instruction sheet. Women do not shave legs or underarms for 12 hours before showers. Men may shave face morning of surgery.    Please read over the following fact sheets that you were given: MRSA Information, blood fact sheet  [facility] pre op nurse phone number [phone], call if needed

## 2012-02-25 NOTE — Anesthesia Preprocedure Evaluation (Addendum)
Anesthesia Evaluation  Patient identified by MRN, date of birth, ID band Patient awake  General Assessment Comment:Past Medical History    Diagnosis  Date    .  Abnormal weight gain      .  Unspecified adverse effect of unspecified drug, medicinal and biological substance      .  Migraine, unspecified, without mention of intractable migraine without mention of status migrainosus      .  Abdominal bruit      .  Allergic rhinitis, cause unspecified      .  Dehydration      .  Acute myocardial infarction of anterolateral wall, subsequent episode of care      .  Diverticular disease          acute diverticulitis 10/13/11    .  CAD (coronary artery disease)          diaphragmatic wall infarction in 1999, treated with balloon angioplasty with nonobstructive disease at ast catheterization in 2007 as described above    .  Hyperlipemia          with low HDL.    .  IBS (irritable bowel syndrome)      .  GERD (gastroesophageal reflux disease)      .  Fibromyalgia      .  Aspirin allergy          INTOLERANCE ALSO TO PLAVIX    .  Diabetes mellitus          Diet Control     Reviewed: Allergy & Precautions, H&P , NPO status , Patient's Chart, lab work & pertinent test results, reviewed documented beta blocker date and time   Airway Mallampati: II TM Distance: >3 FB Neck ROM: full    Dental No notable dental hx.    Pulmonary neg pulmonary ROS,  breath sounds clear to auscultation  Pulmonary exam normal       Cardiovascular Exercise Tolerance: Good hypertension, On Medications and On Home Beta Blockers + CAD and + Past MI Rhythm:regular Rate:Normal     Neuro/Psych  Headaches, PSYCHIATRIC DISORDERS Anxiety Depression  Neuromuscular disease negative neurological ROS  negative psych ROS   GI/Hepatic negative GI ROS, Neg liver ROS, GERD-  ,  Endo/Other  negative endocrine ROS  Renal/GU negative Renal ROS  negative genitourinary     Musculoskeletal negative musculoskeletal ROS (+) Fibromyalgia -  Abdominal   Peds negative pediatric ROS (+)  Hematology negative hematology ROS (+)   Anesthesia Other Findings   Reproductive/Obstetrics negative OB ROS                          Anesthesia Physical Anesthesia Plan  ASA: III  Anesthesia Plan: General ETT   Post-op Pain Management:    Induction: Intravenous  Airway Management Planned: Oral ETT  Additional Equipment:   Intra-op Plan:   Post-operative Plan: Extubation in OR  Informed Consent: I have reviewed the patients History and Physical, chart, labs and discussed the procedure including the risks, benefits and alternatives for the proposed anesthesia with the patient or authorized representative who has indicated his/her understanding and acceptance.   Dental Advisory Given  Plan Discussed with: CRNA  Anesthesia Plan Comments:        Anesthesia Quick Evaluation

## 2012-02-25 NOTE — H&P (Signed)
[name]    MRN: [id number]   Description: 71 year old female  Provider: Ernestene Mention, MD  Department: [facility]       Diagnoses     Diverticulitis   - Primary    562.11        Vitals    BP Pulse Temp Resp Ht Wt    130/68 70 96.9 F (36.1 C) (Temporal) 16 5\' 1"  (1.549 m) 142 lb (64.411 kg)    BMI - 26.83 kg/m2                 History and Physical     Ernestene Mention, MD   Patient ID: [name], female   DOB: [birth year], 71 y.o.   MRN: [id number]              HPI [name] is a 71 y.o. female.  She is referred to me by Dr. Lina Sar for consideration of sigmoid colectomy because of recurrent diverticulitis.   This woman has had irritable bowel syndrome for 3-4 years. With diarrhea alternating with normal bowel movements.She has had episodes of diverticulitis requiring outpatient treatment with antibiotics. She thinks she has had at least 6 episodes. Her symptoms have been worse over the past 4 months with pain and diarrhea. She says the pain is in her lower abdomen. CT scan in September 2012 shows inflammatory changes in the sigmoid colon. Colonoscopy in 2010 shows tortuosity and numerous diverticula of the sigmoid luminal narrowing. Apparently a repeat colonoscopy was done last month which showed precancerous polyps and luminal narrowing in the sigmoid. CT scan 10/11/2001 shows mild inflammatory changes she was treated with antibiotics. Followup CT scan on 11/07/2011 shows mild inflammatory changes. Because of diarrhea a C. Difficile was checked and was negative. She's had a barium enema recently which shows numerous diverticula of the sigmoid colon extending slightly into the distal descending colon with luminal narrowing and tortuosity. Proximally the colon looks pretty good.   Because of accelerating symptoms and frustration she was referred for consideration of sigmoid colectomy she would like to have that done. She is here with her husband  today.   Past history is significant for myocardial infarction 1999 treated  with PTCA. Myoview in 2007 was negative. Recent evaluation by Dr. Marca Ancona Was done and he feels she is an acceptable cardiac risk for sigmoid colon resection. Ejection fraction 60-65%. She also had carried a diagnosis of cervical spinal stenosis and fibromyalgia. Surgically she had some type or procedure at the [facility] in [place] for ptosis of internal organs with the Mersilene mesh sling. She's also had a rectocele repair and a bladder sling and cataracts. She had a vaginal hysterectomy. Apparently she's only had 1 operation on her abdomen which was the Mersilene sling.       Past Medical History   Diagnosis  Date   .  Abnormal weight gain     .  Unspecified adverse effect of unspecified drug, medicinal and biological substance     .  Migraine, unspecified, without mention of intractable migraine without mention of status migrainosus     .  Abdominal bruit     .  Allergic rhinitis, cause unspecified     .  Dehydration     .  Acute myocardial infarction of anterolateral wall, subsequent episode of care     .  Diverticular disease         acute diverticulitis 10/13/11   .  CAD (coronary artery disease)         diaphragmatic wall infarction in 1999, treated with balloon angioplasty with nonobstructive disease at ast catheterization in 2007 as described above   .  Hyperlipemia         with low HDL.   .  IBS (irritable bowel syndrome)     .  GERD (gastroesophageal reflux disease)     .  Fibromyalgia     .  Aspirin allergy         INTOLERANCE ALSO TO PLAVIX   .  Diabetes mellitus         Diet Control       Past Surgical History   Procedure  Date   .  Abdominal surgery          @ [facility]  in [place] in 2002  for ptosis of organs; mersilene mesh sling, cystocopy  by Dr  Earlene Plater, Urologist   .  Colonoscopy  2011       Diverticulosis & hemorrhoids; Dr Juanda Chance   .  Tonsillectomy     .  Vagiocoele          RECTOCOELE   .  Bladder surgery  patient unsure of date       sling   .  Cataract extraction         left eye   .  Partial hysterectomy     .  Abdominal hysterectomy  1979       partial       Family History   Problem  Relation  Age of Onset   .  Heart attack  Brother  65       1/2 BROTHER   .  Bipolar disorder  Sister     .  Heart attack  Sister  60   .  Heart attack  Father  76   .  Ulcers  Father     .  Stroke  Sister         1/2 SISTER   .  Alcohol abuse  Mother     .  Colon cancer  Neg Hx        Social History History   Substance Use Topics   .  Smoking status:  Never Smoker    .  Smokeless tobacco:  Never Used     Comment: second hand smoke for decades   .  Alcohol Use:  No       Allergies   Allergen  Reactions   .  Metronidazole         REACTION: SORES IN MOUTH, severe diarrhea   .  Penicillins         RASH   .  Amphetamine-Dextroamphetamine     .  Atorvastatin     .  Azithromycin     .  Cephalexin     .  Chlorpromazine Hcl     .  Ciprofloxacin         REACTION: SEVERE PAIN,DEHYDRATION,DIARRHEA   .  Doxycycline     .  Ezetimibe         REACTION: JOINT/MUSCLE ACHES   .  Hyoscyamine Sulfate     .  Lactose Intolerance (Gi)     .  Lidocaine  Swelling   .  Sulfamethoxazole         REACTION: GI upset-acid reflux   .  Telithromycin     .  Topiramate  Current Outpatient Prescriptions   Medication  Sig  Dispense  Refill   .  acetaminophen (TYLENOL) 650 MG CR tablet  Take 1,300 mg by mouth 2 (two) times daily.         .  Alum & Mag Hydroxide-Simeth (MAGIC MOUTHWASH) SOLN  Take 5 cc (1 teaspoon) gargled well and swallowed 3 times a day.   10 mL   0   .  aspirin EC 81 MG tablet  Take 1 tablet (81 mg total) by mouth daily.         Marland Kitchen  buPROPion (WELLBUTRIN XL) 150 MG 24 hr tablet           .  Coenzyme Q10 (COQ10) 100 MG CAPS  Take 1 capsule by mouth daily.          .  cycloSPORINE (RESTASIS) 0.05 % ophthalmic emulsion  Place 1 drop into both eyes 2  (two) times daily.         Marland Kitchen  DEPAKOTE SPRINKLES 125 MG capsule  Take 125 mg by mouth every morning.          .  diclofenac (VOLTAREN) 50 MG EC tablet  Take 50 mg by mouth daily.          Marland Kitchen  dicyclomine (BENTYL) 10 MG capsule  Take 1 capsule (10 mg total) by mouth 2 (two) times daily as needed.   60 capsule   0   .  donepezil (ARICEPT) 10 MG tablet  Take 10 mg by mouth Daily.         .  fish oil-omega-3 fatty acids 1000 MG capsule  Take 2 g by mouth daily.          .  fluticasone (FLONASE) 50 MCG/ACT nasal spray  Place 1 spray into the nose 2 (two) times daily as needed for rhinitis.   16 g   2   .  LORazepam (ATIVAN) 0.5 MG tablet  Take 0.5 mg by mouth. 1 IN AM, 3 BY MOUTH QHS         .  Melatonin 5 MG TABS  Take 1 tablet by mouth at bedtime.          Marland Kitchen  NEXIUM 40 MG capsule  TAKE (1) CAPSULE DAILY.   30 each   5   .  OLANZapine (ZYPREXA) 2.5 MG tablet  Take 2.5 mg by mouth at bedtime. As directed         .  propranolol (INDERAL) 10 MG tablet  Take 10 mg by mouth 3 (three) times daily.          .  rosuvastatin (CRESTOR) 20 MG tablet  Take 1 tablet (20 mg total) by mouth at bedtime.   90 tablet   1      Review of Systems   Constitutional: Positive for unexpected weight change. Negative for fever and chills.        18 pound weight loss in 6 months.  HENT: Positive for neck pain and neck stiffness. Negative for hearing loss, congestion, sore throat, trouble swallowing and voice change.   Eyes: Negative for visual disturbance.  Respiratory: Negative for cough and wheezing.   Cardiovascular: Negative for chest pain, palpitations and leg swelling.  Gastrointestinal: Positive for abdominal pain and diarrhea. Negative for nausea, vomiting, constipation, blood in stool, abdominal distention and anal bleeding.  Genitourinary: Negative for hematuria, vaginal bleeding and difficulty urinating.  Musculoskeletal: Negative for arthralgias.  Skin: Negative for rash and wound.  Neurological:  Negative for  seizures, syncope and headaches.  Hematological: Negative for adenopathy. Does not bruise/bleed easily.  Psychiatric/Behavioral: Negative for confusion. The patient is nervous/anxious.     Blood pressure 130/68, pulse 70, temperature 96.9 F (36.1 C), temperature source Temporal, resp. rate 16, height 5\' 1"  (1.549 m), weight 142 lb (64.411 kg).   Physical Exam  Constitutional: She is oriented to person, place, and time. She appears well-developed and well-nourished. No distress.  HENT:   Head: Normocephalic and atraumatic.   Nose: Nose normal.   Mouth/Throat: No oropharyngeal exudate.  Eyes: Conjunctivae normal and EOM are normal. Pupils are equal, round, and reactive to light. Left eye exhibits no discharge. No scleral icterus.  Neck: Neck supple. No JVD present. No tracheal deviation present. No thyromegaly present.  Cardiovascular: Normal rate, regular rhythm, normal heart sounds and intact distal pulses.    No murmur heard. Pulmonary/Chest: Effort normal and breath sounds normal. No respiratory distress. She has no wheezes. She has no rales. She exhibits no tenderness.  Abdominal: Soft. Bowel sounds are normal. She exhibits no distension and no mass. There is tenderness. There is no rebound and no guarding.         Subjectively she is a little tender in the lower abdomen, left greater than right, but there is no mass and no guarding and no rebound. Lower midline scar and a slight transverse scar in the suprapubic area. No hernia  Musculoskeletal: She exhibits no edema and no tenderness.  Lymphadenopathy:    She has no cervical adenopathy.  Neurological: She is alert and oriented to person, place, and time. She exhibits normal muscle tone. Coordination normal.  Skin: Skin is warm. No rash noted. She is not diaphoretic. No erythema. No pallor.  Psychiatric: Her behavior is normal. Judgment and thought content normal.       She is nervous and a little bit tremulous.  she admits to  being more anxious today than usual.    Data Reviewed I have reviewed her colonoscopy report, CT scans, barium enema, recent GI evaluation, and recent cardiac evaluation.   Assessment Accelerating bouts of sigmoid diverticulitis, complicated by luminal narrowing and tortuosity of the colon. She is at increased risk for complications including obstruction, abscess, and perforation. Elective  sigmoid colectomy is appropriate in this setting.   Coronary artery disease, stable. History MI in 1999. Preserved left ventricular function   History laparotomy and Mersilene mesh sling at [facility], details unknown   History bladder suspension    History rectocele repair   History of vaginal hysterectomy   Anxiety depression   Cervical spine stenosis   Fibromyalgia   GERD   Plan I had a long discussion with the patient and her husband regarding the diagnosis of diverticulitis, future risks and natural history of the disease, indications for surgery, techniques for surgery, risks of surgery, temporary disabilities and so forth. She seems to understand these issues and expresses desire to have elective sigmoid colectomy. Her husband agrees   She will be scheduled for elective laparoscopic-assisted sigmoid colectomy, possible open. She is aware this may have to be done open if adhesions from her previous operation are bad. She is also aware the splenic flexure will be mobilized.   Discontinue aspirin 5 days preop   She'll undergo bowel prep at home   Diet and hydration and use of MiraLAX or discussed   I discussed the indications, details, techniques, and numerous risks of the surgery with the patient and her husband. She  is aware that she may need a temporary diverting loop ileostomy. She is aware of the complications of bowel injury and anastomotic leak as well as numerous other risks. She understands these issues. Her questions were answered. She agrees with this  plan.       Angelia Mould. Derrell Lolling, M.D., [facility] General and Minimally invasive Surgery Breast and Colorectal Surgery Office:   [phone] Pager:   [phone]

## 2012-02-26 ENCOUNTER — Encounter (HOSPITAL_COMMUNITY): Payer: Self-pay | Admitting: Anesthesiology

## 2012-02-26 ENCOUNTER — Encounter (HOSPITAL_COMMUNITY): Payer: Self-pay | Admitting: *Deleted

## 2012-02-26 ENCOUNTER — Inpatient Hospital Stay (HOSPITAL_COMMUNITY)
Admission: RE | Admit: 2012-02-26 | Discharge: 2012-03-04 | DRG: 148 | Disposition: A | Discharge status: Home / Self Care | Payer: BC Managed Care – PPO | Source: Ambulatory Visit | Attending: General Surgery | Admitting: General Surgery

## 2012-02-26 ENCOUNTER — Inpatient Hospital Stay (HOSPITAL_COMMUNITY): Payer: BC Managed Care – PPO | Admitting: Anesthesiology

## 2012-02-26 ENCOUNTER — Encounter (HOSPITAL_COMMUNITY)
Admission: RE | Disposition: A | Discharge status: Home / Self Care | Payer: Self-pay | Source: Ambulatory Visit | Attending: General Surgery

## 2012-02-26 DIAGNOSIS — R404 Transient alteration of awareness: Secondary | ICD-10-CM | POA: Diagnosis present

## 2012-02-26 DIAGNOSIS — G250 Essential tremor: Secondary | ICD-10-CM | POA: Diagnosis present

## 2012-02-26 DIAGNOSIS — Z5331 Laparoscopic surgical procedure converted to open procedure: Secondary | ICD-10-CM

## 2012-02-26 DIAGNOSIS — K589 Irritable bowel syndrome without diarrhea: Secondary | ICD-10-CM | POA: Diagnosis present

## 2012-02-26 DIAGNOSIS — R0602 Shortness of breath: Secondary | ICD-10-CM | POA: Diagnosis not present

## 2012-02-26 DIAGNOSIS — R4182 Altered mental status, unspecified: Secondary | ICD-10-CM | POA: Diagnosis not present

## 2012-02-26 DIAGNOSIS — I371 Nonrheumatic pulmonary valve insufficiency: Secondary | ICD-10-CM

## 2012-02-26 DIAGNOSIS — I252 Old myocardial infarction: Secondary | ICD-10-CM

## 2012-02-26 DIAGNOSIS — Z884 Allergy status to anesthetic agent status: Secondary | ICD-10-CM

## 2012-02-26 DIAGNOSIS — Y836 Removal of other organ (partial) (total) as the cause of abnormal reaction of the patient, or of later complication, without mention of misadventure at the time of the procedure: Secondary | ICD-10-CM | POA: Diagnosis not present

## 2012-02-26 DIAGNOSIS — Z7982 Long term (current) use of aspirin: Secondary | ICD-10-CM

## 2012-02-26 DIAGNOSIS — D649 Anemia, unspecified: Secondary | ICD-10-CM | POA: Diagnosis not present

## 2012-02-26 DIAGNOSIS — Y9210 Unspecified residential institution as the place of occurrence of the external cause: Secondary | ICD-10-CM | POA: Diagnosis not present

## 2012-02-26 DIAGNOSIS — Z881 Allergy status to other antibiotic agents status: Secondary | ICD-10-CM

## 2012-02-26 DIAGNOSIS — F419 Anxiety disorder, unspecified: Secondary | ICD-10-CM | POA: Diagnosis present

## 2012-02-26 DIAGNOSIS — K929 Disease of digestive system, unspecified: Secondary | ICD-10-CM | POA: Diagnosis not present

## 2012-02-26 DIAGNOSIS — K219 Gastro-esophageal reflux disease without esophagitis: Secondary | ICD-10-CM | POA: Diagnosis present

## 2012-02-26 DIAGNOSIS — I2510 Atherosclerotic heart disease of native coronary artery without angina pectoris: Secondary | ICD-10-CM | POA: Diagnosis present

## 2012-02-26 DIAGNOSIS — F3289 Other specified depressive episodes: Secondary | ICD-10-CM | POA: Diagnosis present

## 2012-02-26 DIAGNOSIS — J309 Allergic rhinitis, unspecified: Secondary | ICD-10-CM | POA: Diagnosis present

## 2012-02-26 DIAGNOSIS — Z888 Allergy status to other drugs, medicaments and biological substances status: Secondary | ICD-10-CM

## 2012-02-26 DIAGNOSIS — G252 Other specified forms of tremor: Secondary | ICD-10-CM

## 2012-02-26 DIAGNOSIS — F411 Generalized anxiety disorder: Secondary | ICD-10-CM | POA: Diagnosis present

## 2012-02-26 DIAGNOSIS — E785 Hyperlipidemia, unspecified: Secondary | ICD-10-CM | POA: Diagnosis present

## 2012-02-26 DIAGNOSIS — E8779 Other fluid overload: Secondary | ICD-10-CM | POA: Diagnosis not present

## 2012-02-26 DIAGNOSIS — T398X5A Adverse effect of other nonopioid analgesics and antipyretics, not elsewhere classified, initial encounter: Secondary | ICD-10-CM | POA: Diagnosis not present

## 2012-02-26 DIAGNOSIS — I10 Essential (primary) hypertension: Secondary | ICD-10-CM | POA: Diagnosis present

## 2012-02-26 DIAGNOSIS — K560 Paralytic ileus: Secondary | ICD-10-CM | POA: Diagnosis not present

## 2012-02-26 DIAGNOSIS — K5730 Diverticulosis of large intestine without perforation or abscess without bleeding: Secondary | ICD-10-CM | POA: Diagnosis present

## 2012-02-26 DIAGNOSIS — D126 Benign neoplasm of colon, unspecified: Secondary | ICD-10-CM | POA: Diagnosis present

## 2012-02-26 DIAGNOSIS — K5732 Diverticulitis of large intestine without perforation or abscess without bleeding: Secondary | ICD-10-CM

## 2012-02-26 DIAGNOSIS — R159 Full incontinence of feces: Secondary | ICD-10-CM | POA: Diagnosis not present

## 2012-02-26 DIAGNOSIS — E119 Type 2 diabetes mellitus without complications: Secondary | ICD-10-CM | POA: Diagnosis present

## 2012-02-26 DIAGNOSIS — Z9049 Acquired absence of other specified parts of digestive tract: Secondary | ICD-10-CM

## 2012-02-26 DIAGNOSIS — J9819 Other pulmonary collapse: Secondary | ICD-10-CM | POA: Diagnosis not present

## 2012-02-26 DIAGNOSIS — Z9861 Coronary angioplasty status: Secondary | ICD-10-CM

## 2012-02-26 DIAGNOSIS — F329 Major depressive disorder, single episode, unspecified: Secondary | ICD-10-CM | POA: Diagnosis present

## 2012-02-26 DIAGNOSIS — K5792 Diverticulitis of intestine, part unspecified, without perforation or abscess without bleeding: Secondary | ICD-10-CM

## 2012-02-26 DIAGNOSIS — F0390 Unspecified dementia without behavioral disturbance: Secondary | ICD-10-CM | POA: Diagnosis present

## 2012-02-26 DIAGNOSIS — R410 Disorientation, unspecified: Secondary | ICD-10-CM

## 2012-02-26 DIAGNOSIS — M4802 Spinal stenosis, cervical region: Secondary | ICD-10-CM | POA: Diagnosis present

## 2012-02-26 DIAGNOSIS — Z886 Allergy status to analgesic agent status: Secondary | ICD-10-CM

## 2012-02-26 DIAGNOSIS — IMO0001 Reserved for inherently not codable concepts without codable children: Secondary | ICD-10-CM | POA: Diagnosis present

## 2012-02-26 DIAGNOSIS — Z880 Allergy status to penicillin: Secondary | ICD-10-CM

## 2012-02-26 DIAGNOSIS — H538 Other visual disturbances: Secondary | ICD-10-CM | POA: Diagnosis not present

## 2012-02-26 DIAGNOSIS — E782 Mixed hyperlipidemia: Secondary | ICD-10-CM | POA: Diagnosis present

## 2012-02-26 DIAGNOSIS — Z882 Allergy status to sulfonamides status: Secondary | ICD-10-CM

## 2012-02-26 HISTORY — PX: PARTIAL COLECTOMY: SHX5273

## 2012-02-26 HISTORY — PX: LAPAROSCOPIC SIGMOID COLECTOMY: SHX5928

## 2012-02-26 LAB — CREATININE, SERUM
Creatinine, Ser: 0.72 mg/dL (ref 0.50–1.10)
GFR calc non Af Amer: 84 mL/min — ABNORMAL LOW (ref >90)

## 2012-02-26 LAB — TYPE AND SCREEN: ABO/RH(D): O POS

## 2012-02-26 SURGERY — COLECTOMY, SIGMOID, LAPAROSCOPIC
Anesthesia: General | Site: Abdomen | Wound class: Clean

## 2012-02-26 MED ORDER — SODIUM CHLORIDE 0.9 % IJ SOLN: 9.0000 mL | INTRAMUSCULAR | Status: DC | PRN

## 2012-02-26 MED ORDER — POTASSIUM CHLORIDE IN NACL 20-0.9 MEQ/L-% IV SOLN
INTRAVENOUS | Status: AC
  Filled 2012-02-26: qty 1000

## 2012-02-26 MED ORDER — BUPIVACAINE-EPINEPHRINE 0.5% -1:200000 IJ SOLN
INTRAMUSCULAR | Status: DC | PRN
  Administered 2012-02-26: 50 mL

## 2012-02-26 MED ORDER — LACTATED RINGERS IV SOLN
INTRAVENOUS | Status: DC | PRN
  Administered 2012-02-26 (×3): via INTRAVENOUS

## 2012-02-26 MED ORDER — ONDANSETRON HCL 4 MG/2ML IJ SOLN
4.0000 mg | Freq: Four times a day (QID) | INTRAMUSCULAR | Status: DC | PRN
  Administered 2012-03-04: 4 mg via INTRAVENOUS
  Filled 2012-02-26: qty 2

## 2012-02-26 MED ORDER — ACETAMINOPHEN 10 MG/ML IV SOLN
INTRAVENOUS | Status: DC | PRN
  Administered 2012-02-26: 1000 mg via INTRAVENOUS

## 2012-02-26 MED ORDER — DIPHENHYDRAMINE HCL 12.5 MG/5ML PO ELIX: 12.5000 mg | ORAL_SOLUTION | Freq: Four times a day (QID) | ORAL | Status: DC | PRN

## 2012-02-26 MED ORDER — POTASSIUM CHLORIDE IN NACL 20-0.9 MEQ/L-% IV SOLN
INTRAVENOUS | Status: DC
  Administered 2012-02-26 – 2012-02-27 (×2): via INTRAVENOUS
  Administered 2012-02-28: 125 mL via INTRAVENOUS
  Administered 2012-02-28: 13:00:00 via INTRAVENOUS
  Filled 2012-02-26 (×9): qty 1000

## 2012-02-26 MED ORDER — SODIUM CHLORIDE 0.9 % IV SOLN
1.0000 g | INTRAVENOUS | Status: AC
  Administered 2012-02-27: 1 g via INTRAVENOUS
  Filled 2012-02-26: qty 1

## 2012-02-26 MED ORDER — 0.9 % SODIUM CHLORIDE (POUR BTL) OPTIME
TOPICAL | Status: DC | PRN
  Administered 2012-02-26: 1000 mL

## 2012-02-26 MED ORDER — NEOSTIGMINE METHYLSULFATE 1 MG/ML IJ SOLN
INTRAMUSCULAR | Status: DC | PRN
  Administered 2012-02-26: 3 mg via INTRAVENOUS

## 2012-02-26 MED ORDER — PROPOFOL 10 MG/ML IV BOLUS
INTRAVENOUS | Status: DC | PRN
  Administered 2012-02-26: 130 mg via INTRAVENOUS

## 2012-02-26 MED ORDER — QUETIAPINE FUMARATE ER 50 MG PO TB24
150.0000 mg | ORAL_TABLET | Freq: Every day | ORAL | Status: DC
  Filled 2012-02-26 (×2): qty 3

## 2012-02-26 MED ORDER — ONDANSETRON HCL 4 MG/2ML IJ SOLN: 4.0000 mg | Freq: Four times a day (QID) | INTRAMUSCULAR | Status: DC | PRN

## 2012-02-26 MED ORDER — CLONIDINE HCL 0.2 MG PO TABS
0.2000 mg | ORAL_TABLET | Freq: Every day | ORAL | Status: DC
  Administered 2012-02-26 – 2012-02-27 (×2): 0.2 mg via ORAL
  Filled 2012-02-26 (×8): qty 1

## 2012-02-26 MED ORDER — DIPHENHYDRAMINE HCL 50 MG/ML IJ SOLN: 12.5000 mg | Freq: Four times a day (QID) | INTRAMUSCULAR | Status: DC | PRN

## 2012-02-26 MED ORDER — LACTATED RINGERS IR SOLN
Status: DC | PRN
  Administered 2012-02-26: 1000 mL

## 2012-02-26 MED ORDER — ROCURONIUM BROMIDE 100 MG/10ML IV SOLN
INTRAVENOUS | Status: DC | PRN
  Administered 2012-02-26 (×4): 10 mg via INTRAVENOUS
  Administered 2012-02-26: 40 mg via INTRAVENOUS
  Administered 2012-02-26: 10 mg via INTRAVENOUS

## 2012-02-26 MED ORDER — ONDANSETRON HCL 4 MG PO TABS: 4.0000 mg | ORAL_TABLET | Freq: Four times a day (QID) | ORAL | Status: DC | PRN

## 2012-02-26 MED ORDER — NALOXONE HCL 0.4 MG/ML IJ SOLN: 0.4000 mg | INTRAMUSCULAR | Status: DC | PRN

## 2012-02-26 MED ORDER — PROMETHAZINE HCL 25 MG/ML IJ SOLN: 6.2500 mg | INTRAMUSCULAR | Status: DC | PRN

## 2012-02-26 MED ORDER — SODIUM CHLORIDE 0.9 % IV SOLN
1.0000 g | INTRAVENOUS | Status: AC
  Administered 2012-02-26: 1 g via INTRAVENOUS

## 2012-02-26 MED ORDER — HYDROMORPHONE HCL PF 1 MG/ML IJ SOLN: 0.2500 mg | INTRAMUSCULAR | Status: DC | PRN

## 2012-02-26 MED ORDER — CLONIDINE HCL 0.1 MG PO TABS
0.1000 mg | ORAL_TABLET | Freq: Every day | ORAL | Status: DC
  Administered 2012-02-27 – 2012-02-29 (×3): 0.1 mg via ORAL
  Filled 2012-02-26 (×4): qty 1

## 2012-02-26 MED ORDER — OXYCODONE-ACETAMINOPHEN 5-325 MG PO TABS: 1.0000 | ORAL_TABLET | ORAL | Status: DC | PRN

## 2012-02-26 MED ORDER — PROPRANOLOL HCL 10 MG PO TABS
10.0000 mg | ORAL_TABLET | Freq: Three times a day (TID) | ORAL | Status: DC
  Administered 2012-02-26 – 2012-02-29 (×10): 10 mg via ORAL
  Filled 2012-02-26 (×14): qty 1

## 2012-02-26 MED ORDER — CLONAZEPAM 0.5 MG PO TABS
0.5000 mg | ORAL_TABLET | Freq: Three times a day (TID) | ORAL | Status: DC
  Administered 2012-02-26: 0.5 mg via ORAL
  Filled 2012-02-26: qty 1

## 2012-02-26 MED ORDER — ALVIMOPAN 12 MG PO CAPS
12.0000 mg | ORAL_CAPSULE | Freq: Once | ORAL | Status: AC
  Administered 2012-02-26: 12 mg via ORAL
  Filled 2012-02-26: qty 1

## 2012-02-26 MED ORDER — MELATONIN 5 MG PO TABS: 1.0000 | ORAL_TABLET | Freq: Every day | ORAL | Status: DC

## 2012-02-26 MED ORDER — ALVIMOPAN 12 MG PO CAPS
12.0000 mg | ORAL_CAPSULE | Freq: Two times a day (BID) | ORAL | Status: DC
  Administered 2012-02-27 – 2012-02-29 (×4): 12 mg via ORAL
  Filled 2012-02-26 (×10): qty 1

## 2012-02-26 MED ORDER — GLYCOPYRROLATE 0.2 MG/ML IJ SOLN
INTRAMUSCULAR | Status: DC | PRN
  Administered 2012-02-26: 0.4 mg via INTRAVENOUS

## 2012-02-26 MED ORDER — HYDROMORPHONE 0.3 MG/ML IV SOLN
INTRAVENOUS | Status: DC
  Administered 2012-02-26: 1.2 mg via INTRAVENOUS
  Administered 2012-02-26: 0.3 mg via INTRAVENOUS
  Administered 2012-02-27 (×2): 0.6 mg via INTRAVENOUS
  Administered 2012-02-27 (×2): 0.3 mg via INTRAVENOUS
  Administered 2012-02-27: 0.9 mg via INTRAVENOUS
  Administered 2012-02-28: 0.3 mg via INTRAVENOUS
  Administered 2012-02-28: 1.8 mg via INTRAVENOUS

## 2012-02-26 MED ORDER — DEXAMETHASONE SODIUM PHOSPHATE 10 MG/ML IJ SOLN
INTRAMUSCULAR | Status: DC | PRN
  Administered 2012-02-26: 10 mg via INTRAVENOUS

## 2012-02-26 MED ORDER — LIDOCAINE HCL (CARDIAC) 20 MG/ML IV SOLN
INTRAVENOUS | Status: DC | PRN
  Administered 2012-02-26: 50 mg via INTRAVENOUS

## 2012-02-26 MED ORDER — FENTANYL CITRATE 0.05 MG/ML IJ SOLN
INTRAMUSCULAR | Status: DC | PRN
  Administered 2012-02-26 (×2): 50 ug via INTRAVENOUS
  Administered 2012-02-26: 25 ug via INTRAVENOUS
  Administered 2012-02-26: 50 ug via INTRAVENOUS
  Administered 2012-02-26: 25 ug via INTRAVENOUS

## 2012-02-26 MED ORDER — EPHEDRINE SULFATE 50 MG/ML IJ SOLN
INTRAMUSCULAR | Status: DC | PRN
  Administered 2012-02-26 (×2): 10 mg via INTRAVENOUS

## 2012-02-26 MED ORDER — PANTOPRAZOLE SODIUM 40 MG PO TBEC
40.0000 mg | DELAYED_RELEASE_TABLET | Freq: Every day | ORAL | Status: DC
  Administered 2012-02-27 – 2012-03-03 (×6): 40 mg via ORAL
  Filled 2012-02-26 (×7): qty 1

## 2012-02-26 MED ORDER — DONEPEZIL HCL 10 MG PO TABS
10.0000 mg | ORAL_TABLET | Freq: Every morning | ORAL | Status: DC
  Administered 2012-02-27 – 2012-03-03 (×6): 10 mg via ORAL
  Filled 2012-02-26 (×7): qty 1

## 2012-02-26 MED ORDER — HEPARIN SODIUM (PORCINE) 5000 UNIT/ML IJ SOLN
5000.0000 [IU] | Freq: Once | INTRAMUSCULAR | Status: AC
  Administered 2012-02-26: 5000 [IU] via SUBCUTANEOUS
  Filled 2012-02-26: qty 1

## 2012-02-26 MED ORDER — BUPROPION HCL ER (XL) 150 MG PO TB24
150.0000 mg | ORAL_TABLET | Freq: Every morning | ORAL | Status: DC
  Filled 2012-02-26 (×2): qty 1

## 2012-02-26 MED ORDER — HEPARIN SODIUM (PORCINE) 5000 UNIT/ML IJ SOLN
5000.0000 [IU] | Freq: Three times a day (TID) | INTRAMUSCULAR | Status: DC
  Administered 2012-02-27 – 2012-03-04 (×19): 5000 [IU] via SUBCUTANEOUS
  Filled 2012-02-26 (×24): qty 1

## 2012-02-26 MED ORDER — ONDANSETRON HCL 4 MG/2ML IJ SOLN
INTRAMUSCULAR | Status: DC | PRN
  Administered 2012-02-26: 4 mg via INTRAVENOUS

## 2012-02-26 MED ORDER — SUCCINYLCHOLINE CHLORIDE 20 MG/ML IJ SOLN
INTRAMUSCULAR | Status: DC | PRN
  Administered 2012-02-26: 100 mg via INTRAVENOUS

## 2012-02-26 MED ORDER — MIDAZOLAM HCL 5 MG/5ML IJ SOLN
INTRAMUSCULAR | Status: DC | PRN
  Administered 2012-02-26: 2 mg via INTRAVENOUS

## 2012-02-26 MED ORDER — CLONIDINE HCL 0.1 MG PO TABS
0.1000 mg | ORAL_TABLET | Freq: Two times a day (BID) | ORAL | Status: DC
  Filled 2012-02-26: qty 2

## 2012-02-26 SURGICAL SUPPLY — 88 items
APPLICATOR COTTON TIP 6IN STRL (MISCELLANEOUS) ×2 IMPLANT
APPLIER CLIP 5 13 M/L LIGAMAX5 (MISCELLANEOUS)
APPLIER CLIP ROT 10 11.4 M/L (STAPLE)
APR CLP MED LRG 11.4X10 (STAPLE)
APR CLP MED LRG 5 ANG JAW (MISCELLANEOUS)
BLADE EXTENDED COATED 6.5IN (ELECTRODE) ×2 IMPLANT
BLADE HEX COATED 2.75 (ELECTRODE) ×2 IMPLANT
BLADE SURG SZ10 CARB STEEL (BLADE) ×2 IMPLANT
CANISTER SUCTION 2500CC (MISCELLANEOUS) ×3 IMPLANT
CANNULA ENDOPATH XCEL 11M (ENDOMECHANICALS) ×1 IMPLANT
CELLS DAT CNTRL 66122 CELL SVR (MISCELLANEOUS) ×1 IMPLANT
CLAMP ENDO BABCK 10MM (STAPLE) ×1 IMPLANT
CLIP APPLIE 5 13 M/L LIGAMAX5 (MISCELLANEOUS) ×1 IMPLANT
CLIP APPLIE ROT 10 11.4 M/L (STAPLE) ×1 IMPLANT
CLIP TI LARGE 6 (CLIP) IMPLANT
CLOTH BEACON ORANGE TIMEOUT ST (SAFETY) ×2 IMPLANT
COVER MAYO STAND STRL (DRAPES) ×2 IMPLANT
DECANTER SPIKE VIAL GLASS SM (MISCELLANEOUS) ×1 IMPLANT
DEVICE TROCAR PUNCTURE CLOSURE (ENDOMECHANICALS) ×1 IMPLANT
DRAIN CHANNEL 19F RND (DRAIN) ×1 IMPLANT
DRAPE LAPAROSCOPIC ABDOMINAL (DRAPES) ×2 IMPLANT
DRAPE LG THREE QUARTER DISP (DRAPES) ×3 IMPLANT
DRAPE POUCH INSTRU U-SHP 10X18 (DRAPES) ×1 IMPLANT
DRAPE UTILITY 15X26 (DRAPE) ×1 IMPLANT
DRAPE WARM FLUID 44X44 (DRAPE) ×3 IMPLANT
DRSG PAD ABDOMINAL 8X10 ST (GAUZE/BANDAGES/DRESSINGS) IMPLANT
ELECT REM PT RETURN 9FT ADLT (ELECTROSURGICAL) ×2
ELECTRODE REM PT RTRN 9FT ADLT (ELECTROSURGICAL) ×1 IMPLANT
ENSEAL DEVICE STD TIP 35CM (ENDOMECHANICALS) ×1 IMPLANT
EVACUATOR SILICONE 100CC (DRAIN) ×1 IMPLANT
GLOVE BIOGEL PI IND STRL 7.0 (GLOVE) ×1 IMPLANT
GLOVE BIOGEL PI INDICATOR 7.0 (GLOVE) ×2
GLOVE ECLIPSE 8.0 STRL XLNG CF (GLOVE) ×2 IMPLANT
GLOVE EUDERMIC 7 POWDERFREE (GLOVE) ×2 IMPLANT
GLOVE SURG SS PI 7.0 STRL IVOR (GLOVE) ×8 IMPLANT
GOWN STRL NON-REIN LRG LVL3 (GOWN DISPOSABLE) ×1 IMPLANT
GOWN STRL REIN XL XLG (GOWN DISPOSABLE) ×6 IMPLANT
GRASPER LAPSCPC 5X35 EPIX (ENDOMECHANICALS) ×2 IMPLANT
HAND ACTIVATED (MISCELLANEOUS) ×2 IMPLANT
KIT BASIN OR (CUSTOM PROCEDURE TRAY) ×2 IMPLANT
LEGGING LITHOTOMY PAIR STRL (DRAPES) ×3 IMPLANT
LIGASURE IMPACT 36 18CM CVD LR (INSTRUMENTS) ×2 IMPLANT
NS IRRIG 1000ML POUR BTL (IV SOLUTION) ×4 IMPLANT
PACK GENERAL/GYN (CUSTOM PROCEDURE TRAY) ×1 IMPLANT
PENCIL BUTTON HOLSTER BLD 10FT (ELECTRODE) ×2 IMPLANT
RETRACTOR WND ALEXIS 18 MED (MISCELLANEOUS) ×1 IMPLANT
RTRCTR WOUND ALEXIS 18CM MED (MISCELLANEOUS) ×2
SCISSORS LAP 5X35 DISP (ENDOMECHANICALS) ×2 IMPLANT
SET IRRIG TUBING LAPAROSCOPIC (IRRIGATION / IRRIGATOR) ×2 IMPLANT
SLEEVE XCEL OPT CAN 5 100 (ENDOMECHANICALS) ×1 IMPLANT
SOLUTION ANTI FOG 6CC (MISCELLANEOUS) ×2 IMPLANT
SPONGE GAUZE 4X4 12PLY (GAUZE/BANDAGES/DRESSINGS) ×1 IMPLANT
SPONGE LAP 18X18 X RAY DECT (DISPOSABLE) ×5 IMPLANT
STAPLER CIRC ILS CVD 25MM (STAPLE) ×1 IMPLANT
STAPLER CUT CVD 40MM GREEN (STAPLE) ×1 IMPLANT
STAPLER CUT RELOAD GREEN (STAPLE) ×1 IMPLANT
STAPLER VISISTAT 35W (STAPLE) ×2 IMPLANT
STRIP CLOSURE SKIN 1/2X4 (GAUZE/BANDAGES/DRESSINGS) ×1 IMPLANT
SUCTION POOLE TIP (SUCTIONS) ×2 IMPLANT
SUT NOV 1 T60/GS (SUTURE) IMPLANT
SUT NOVA NAB DX-16 0-1 5-0 T12 (SUTURE) IMPLANT
SUT NOVA T20/GS 25 (SUTURE) IMPLANT
SUT PDS AB 1 CT1 27 (SUTURE) ×2 IMPLANT
SUT PDS AB 1 TP1 96 (SUTURE) ×4 IMPLANT
SUT PROLENE 0 SH 30 (SUTURE) ×1 IMPLANT
SUT SILK 2 0 (SUTURE) ×2
SUT SILK 2 0 SH CR/8 (SUTURE) ×2 IMPLANT
SUT SILK 2 0SH CR/8 30 (SUTURE) IMPLANT
SUT SILK 2-0 18XBRD TIE 12 (SUTURE) ×1 IMPLANT
SUT SILK 2-0 30XBRD TIE 12 (SUTURE) IMPLANT
SUT SILK 3 0 (SUTURE) ×2
SUT SILK 3 0 SH CR/8 (SUTURE) ×3 IMPLANT
SUT SILK 3-0 18XBRD TIE 12 (SUTURE) ×2 IMPLANT
SYR BULB IRRIGATION 50ML (SYRINGE) ×1 IMPLANT
TAPE CLOTH SURG 4X10 WHT LF (GAUZE/BANDAGES/DRESSINGS) ×1 IMPLANT
TOWEL OR 17X26 10 PK STRL BLUE (TOWEL DISPOSABLE) ×4 IMPLANT
TOWEL OR NON WOVEN STRL DISP B (DISPOSABLE) ×1 IMPLANT
TRAY FOLEY CATH 14FRSI W/METER (CATHETERS) ×1 IMPLANT
TRAY LAP CHOLE (CUSTOM PROCEDURE TRAY) ×2 IMPLANT
TROCAR ADV FIXATION 5X100MM (TROCAR) ×1 IMPLANT
TROCAR BLADELESS OPT 5 100 (ENDOMECHANICALS) ×1 IMPLANT
TROCAR BLADELESS OPT 5 75 (ENDOMECHANICALS) ×2 IMPLANT
TROCAR XCEL BLUNT TIP 100MML (ENDOMECHANICALS) ×2 IMPLANT
TROCAR XCEL NON-BLD 11X100MML (ENDOMECHANICALS) ×1 IMPLANT
TUBING INSUFFLATION 10FT LAP (TUBING) ×2 IMPLANT
WATER STERILE IRR 1500ML POUR (IV SOLUTION) ×1 IMPLANT
YANKAUER SUCT BULB TIP 10FT TU (MISCELLANEOUS) ×2 IMPLANT
YANKAUER SUCT BULB TIP NO VENT (SUCTIONS) ×2 IMPLANT

## 2012-02-26 NOTE — Interval H&P Note (Signed)
History and Physical Interval Note:  02/26/2012 8:19 AM  [name]  has presented today for surgery, with the diagnosis of diverticulitis  The goals and the various methods of treatment have been discussed with the patient and family. After consideration of risks, benefits and other options for treatment, the patient has consented to  Procedure(s) (LRB) with comments: LAPAROSCOPIC SIGMOID COLECTOMY (N/A) - Laparoscopic Assisted Sigmoid Colectomy PARTIAL COLECTOMY (N/A) - Sigmoid Colectomy as a surgical intervention .  The patient's history has been reviewed, patient examined today, no change in status, stable for surgery.  I have reviewed the patient's chart and labs.  Questions were answered to the patient's satisfaction.     Ernestene Mention

## 2012-02-26 NOTE — Progress Notes (Signed)

## 2012-02-26 NOTE — Progress Notes (Signed)
Spoke with Dr.Thomas about family concerns regarding patients slow mentation.  Patient is able to follow commands however has difficulty at times with recall and having questions repeated several times before answered.  We discussed holding patients dose of Seroquel and Klonopin this evening in order to continue to assess patient progress post anesthesia.  Family  made aware, [name], pt daughter at bedside.

## 2012-02-26 NOTE — Transfer of Care (Signed)
Immediate Anesthesia Transfer of Care Note  Patient: [name]  Procedure(s) Performed: Procedure(s) (LRB) with comments: LAPAROSCOPIC SIGMOID COLECTOMY (N/A) - Laparoscopic Assisted Sigmoid Colectomy PARTIAL COLECTOMY (N/A) - Sigmoid Colectomy  Patient Location: PACU  Anesthesia Type:General  Level of Consciousness: sedated  Airway & Oxygen Therapy: Patient Spontanous Breathing and Patient connected to face mask oxygen  Post-op Assessment: Report given to PACU RN and Post -op Vital signs reviewed and stable  Post vital signs: Reviewed and stable  Complications: No apparent anesthesia complications

## 2012-02-26 NOTE — Op Note (Signed)
Patient Name:           [name]   Date of Surgery:        02/26/2012  Pre op Diagnosis:      Recurrent diverticulitis  Post op Diagnosis:    Same  Procedure:                 Laparoscopic takedown of splenic flexure, laparoscopic low anterior resection  Surgeon:                     Angelia Mould. Derrell Lolling, M.D., FACS  Assistant:                      Lodema Pilot,  M.D.  Operative Indications:   [name] is a 71 y.o. female. She is referred to me by Dr. Lina Sar for consideration of sigmoid colectomy because of recurrent diverticulitis.  This woman has had irritable bowel syndrome for 3-4 years, with diarrhea alternating with normal bowel movements.She has had episodes of diverticulitis requiring outpatient treatment with antibiotics. She thinks she has had at least 6 episodes. Her symptoms have been worse over the past 4 months with pain and diarrhea. She says the pain is in her lower abdomen. CT scan in September 2012 shows inflammatory changes in the sigmoid colon. Colonoscopy in 2010 shows tortuosity and numerous diverticula of the sigmoid luminal narrowing. Apparently a repeat colonoscopy was done last month which showed precancerous polyps and luminal narrowing in the sigmoid. CT scan 10/11/2001 shows mild inflammatory changes she was treated with antibiotics. Followup CT scan on 11/07/2011 shows mild inflammatory changes. Because of diarrhea a C. Difficile was checked and was negative. She's had a barium enema recently which shows numerous diverticula of the sigmoid colon extending slightly into the distal descending colon with luminal narrowing and tortuosity. Proximally the colon looks pretty good.  Because of accelerating symptoms and frustration she was referred for consideration of sigmoid colectomy she would like to have that done.She was interviewed, examined, and counseled as an outpatient. She elected to proceed with sigmoid colectomy at this time Past history is significant for  myocardial infarction 1999 treated with PTCA. Myoview in 2007 was negative. Recent evaluation by Dr. Marca Ancona was done and he feels she is an acceptable cardiac risk for sigmoid colon resection. Ejection fraction 60-65%. She also had carried a diagnosis of cervical spinal stenosis and fibromyalgia. Surgically she had some type or procedure at the [facility] in [place] for ptosis of internal organs with the Mersilene mesh sling. She's also had a rectocele repair and a bladder sling and cataracts. She had a vaginal hysterectomy. Apparently she's only had 1 operation on her abdomen which was the Mersilene sling.  The patient underwent a mechanical and antibiotic bowel prep at home over the past 24 hours.   Operative Findings:       There was about a 12 inch segment of sigmoid colon which was thickened, with numerous diverticula and luminal narrowing. The proximal colon and rectum were of small caliber but were soft. Deep in the pelvis there were some palpable thickening, which might be related to her previous Mersilene sling operation. There were significant adhesions of omentum and small bowel to the anterior abdominal wall from her previous operation.  Procedure in Detail:   Following the induction of general endotracheal anesthesia a Foley catheter was placed. The patient was placed in a modified lithotomy position in rigid padded stirrups,  and the abdomen and perineum were prepped and draped in a sterile fashion. Intravenous antibiotics were given. Surgical time out was performed. An 11 mm Hassan trocar was placed at the upper rim of the umbilicus with an open technique. Pneumoperitoneum was created the camera was inserted. Just below the umbilicus we could see dense omental adhesions and to  the right and left we could see some soft small bowel adhesions on  the abdominal wall. I placed a 5 mm trocar in the right upper quadrant, 5 mm took down all the small bowel adhesions. This was done  slowly  and very carefully with cold scissors. There was minimal bleeding. I then examined the proximal rectum sigmoid colon, descending colon and transverse colon with findings as described above. I mobilized the sigmoid colon and descending colon by dividing the lateral peritoneal attachments. I mobilized extensively. I took down the splenic flexure using scissors and harmonic scalpel until I had the splenic flexure completely mobilized over to the mid transverse colon. We had good mobility at this point. I took the dissection back to the left lower quadrant. I continued to mobilize sigmoid colon medially. I identified the ureter from the left side of which was uninjured. I carried the dissection down into the pelvis incising the peritoneum on the right and left of the distal sigmoid and  proximal rectum. I got down to the peritoneal flexion there fairly dense adhesions. This point I converted to an open procedure.  The pneumoperitoneum was released. The trocars were removed. A lower midline incision was made approximately 8 cm in length. The fascia was incised in the midline and the abdominal cavity entered under direct vision. A wound protractor was placed. After assessing the situation I could tell the extent of her diverticular disease. I divided her colon with a contour stapler just proximal to the diseased area at the descending and sigmoid junction. Using a LigaSure device I took down the sigmoid mesentery staying very close to the colon. With the dissection down into the pelvis to the peritoneal reflection. I then very carefully mobilized the mid rectum at this point because there were some chronic thickening and scarring from the Mersilene sling, but  the mid rectum was quite soft and healthy. I divided the mid rectum with a contour stapling device and removed the colon specimen marking it distally with a silk suture. I went back to the proximal colon segment and amputated the staple line and placed a  pursestring suture of 0 Prolene. I found that the colon would take no more than a 25 mm EEA stapler. Dr. Biagio Quint went down to the peroneal exposure and a proctoscopy and then inserted a 25 mm EEA stapler. I  previously placed the anvil in the proximal segment and tied the suture down.  Dr. Biagio Quint passed the stapler up through the rectum until I could see the stapler we clearly just anterior to  the staple line. The spike of the stapler was opened. The proximal colon was put onto the spike being careful not to twist the proximal colon. The stapler was closed fired opened and removed. We had 2 good donuts of tissue. I clamped the proximal bowel and filled the pelvis with a little bit of saline. Dr. Biagio Quint performed a proctoscopy and saw little bit of blood. Insufflated the colon until it was fairly tight, and it insufflated proximal to the anastomosis but there were no air bubbles. The air was evacuated and the proctoscope removed. At this point we  changed our instruments and gloves and suction devices. We then irrigated out the pelvis. A 19 [place] Blake drain was placed into the pelvis anterior to the anastomosis and brought out to the right lower quadrant trocar site, sutured the skin and connected to suction bulb. We irrigated out further. All laparotomy sponges were accounted for. We brought the transverse colon and omentum down to the midline fascia was closed with a running suture of #1 double-stranded PDS and skin closed loosely with a few skin staples and Telfa wicks. The trocar site above the umbilicus was closed with 0 Vicryl sutures and also closed with staples and Telfa wicks. Bandages were placed and the patient taken to recovery in stable condition. EBL 150 cc. Complications none. Counts correct.            Angelia Mould. Derrell Lolling, M.D., FACS General and Minimally Invasive Surgery Breast and Colorectal Surgery  02/26/2012 12:16 PM

## 2012-02-26 NOTE — Anesthesia Postprocedure Evaluation (Signed)
  Anesthesia Post-op Note  Patient: [name]  Procedure(s) Performed: Procedure(s) (LRB): LAPAROSCOPIC SIGMOID COLECTOMY (N/A) PARTIAL COLECTOMY (N/A)  Patient Location: PACU  Anesthesia Type: General  Level of Consciousness: awake and alert   Airway and Oxygen Therapy: Patient Spontanous Breathing  Post-op Pain: mild  Post-op Assessment: Post-op Vital signs reviewed, Patient's Cardiovascular Status Stable, Respiratory Function Stable, Patent Airway and No signs of Nausea or vomiting  Last Vitals:  Filed Vitals:   02/26/12 1315  BP: 133/62  Pulse: 48  Temp:   Resp: 15    Post-op Vital Signs: stable   Complications: No apparent anesthesia complications

## 2012-02-27 LAB — BASIC METABOLIC PANEL
BUN: 9 mg/dL (ref 6–23)
Calcium: 8.8 mg/dL (ref 8.4–10.5)
GFR calc Af Amer: >90 mL/min (ref >90)
GFR calc non Af Amer: 84 mL/min — ABNORMAL LOW (ref >90)
Glucose, Bld: 101 mg/dL — ABNORMAL HIGH (ref 70–99)
Sodium: 136 mEq/L (ref 135–145)

## 2012-02-27 LAB — CBC
MCH: 33 pg (ref 26.0–34.0)
MCHC: 35 g/dL (ref 30.0–36.0)
Platelets: 206 10*3/uL (ref 150–400)
RBC: 3.79 MIL/uL — ABNORMAL LOW (ref 3.87–5.11)

## 2012-02-27 NOTE — Care Management Note (Addendum)
    Page 1 of 2   03/03/2012     12:31:10 PM   CARE MANAGEMENT NOTE 03/03/2012  Patient:  [name], [name]   Account Number:  [id number]  Date Initiated:  02/27/2012  Documentation initiated by:  Lorenda Ishihara  Subjective/Objective Assessment:   71 yo female admitted s/p lap colectomy. PTA lived at home with dtr.     Action/Plan:   home when stable   Anticipated DC Date:  03/03/2012   Anticipated DC Plan:  HOME W HOME HEALTH SERVICES  In-house referral  Clinical Social Worker      DC Associate Professor  CM consult      PAC Choice  DURABLE MEDICAL EQUIPMENT  HOME HEALTH   Choice offered to / List presented to:  C-3 Spouse   DME arranged  [facility]  3-N-1      DME agency  [facility]     HH arranged  HH-2 PT  HH-3 OT  HH-6 SOCIAL WORKER      Status of service:  Completed, signed off Medicare Important Message given?   (If response is "NO", the following Medicare IM given date fields will be blank) Date Medicare IM given:   Date Additional Medicare IM given:    Discharge Disposition:  HOME W HOME HEALTH SERVICES  Per UR Regulation:  Reviewed for med. necessity/level of care/duration of stay  If discussed at Long Length of Stay Meetings, dates discussed:    Comments:  03-03-12 Lorenda Ishihara RN CM 1200 Spoke with patient and spouse at length yesterday about d/c plans. Patient declines SNF for rehab at this time. Spouse is supportive of taking patient home. States has [facility] available for 8hrs/day and they will cover the other times, stressed the need for 24/7 care for at least a week since patient is deconditioned and needing assistance with ADL's. Provided husband with list of HH agencies for choice. Contacted Dr. Michaell Cowing today who indicates patient is medically ready for d/c today, understands that she is declining SNF. Will arrange for [facility] PT/OT/SW at d/c for continued home support. Discussed HH choice with husband today and he has chosen [facility].  Also requesting RW and beside commode. Contacted Darl Pikes with [facility] to arrange. Awaiting final HH orders and d/c orders. Spouse is contacting home instead to notify them of d/c and need for service, he has been in touch with them and they are awaiting his call. Spouse plans to come soon for patient transport home. RN to notify Dr. Michaell Cowing of arrangements.

## 2012-02-27 NOTE — Progress Notes (Signed)
Discussed with daughter and spouse about foley removal and the task of in and out cath per Dr. Derrell Lolling.

## 2012-02-27 NOTE — Progress Notes (Signed)
1 Day Post-Op  Subjective: Stable and alert. More confused and disoriented than baseline last bite but getting better this morning according to daughter who spent the night. Chronic tremor perhaps slightly worse. Pain seems under control. No nausea or vomiting. Vital signs stable.  Glucose 101, potassium 4.3, hemoglobin 12.5.  Objective: Vital signs in last 24 hours: Temp:  [95.8 F (35.4 C)-99.8 F (37.7 C)] 99.8 F (37.7 C) (11/21 0600) Pulse Rate:  [48-88] 72  (11/21 0600) Resp:  [10-20] 20  (11/21 0600) BP: (102-153)/(57-98) 102/68 mmHg (11/21 0600) SpO2:  [96 %-100 %] 98 % (11/21 0600) Weight:  [134 lb 14.4 oz (61.19 kg)] 134 lb 14.4 oz (61.19 kg) (11/20 1500)    Intake/Output from previous day: 11/20 0701 - 11/21 0700 In: 4797.5 [P.O.:60; I.V.:4737.5] Out: 3300 [Urine:2975; Drains:125; Blood:200] Intake/Output this shift:    General appearance: awake and alert. Flat affect. Verbalizes normally.In no distress. Skin warm and dry. Resp: clear to auscultation bilaterally GI: abdomen is soft and flat. Incision looks good. Not distended. JP draining serosanguineous. Neurologic: Chronic tremor at rest of upper extremities and facial mandibular muscles. Awake, alert, flat affect. No gross motor or sensory deficits.  Lab Results:  Results for orders placed during the hospital encounter of 02/26/12 (from the past 24 hour(s))  CREATININE, SERUM     Status: Abnormal   Collection Time   02/26/12  3:55 PM      Component Value Range   Creatinine, Ser 0.72  0.50 - 1.10 mg/dL   GFR calc non Af Amer 84 (*) >90 mL/min   GFR calc Af Amer >90  >90 mL/min  BASIC METABOLIC PANEL     Status: Abnormal   Collection Time   02/27/12  4:31 AM      Component Value Range   Sodium 136  135 - 145 mEq/L   Potassium 4.3  3.5 - 5.1 mEq/L   Chloride 101  96 - 112 mEq/L   CO2 24  19 - 32 mEq/L   Glucose, Bld 101 (*) 70 - 99 mg/dL   BUN 9  6 - 23 mg/dL   Creatinine, Ser 4.09  0.50 - 1.10 mg/dL   Calcium 8.8  8.4 - 81.1 mg/dL   GFR calc non Af Amer 84 (*) >90 mL/min   GFR calc Af Amer >90  >90 mL/min  CBC     Status: Abnormal   Collection Time   02/27/12  4:31 AM      Component Value Range   WBC 13.9 (*) 4.0 - 10.5 K/uL   RBC 3.79 (*) 3.87 - 5.11 MIL/uL   Hemoglobin 12.5  12.0 - 15.0 g/dL   HCT 91.4 (*) 78.2 - 95.6 %   MCV 94.2  78.0 - 100.0 fL   MCH 33.0  26.0 - 34.0 pg   MCHC 35.0  30.0 - 36.0 g/dL   RDW 21.3  08.6 - 57.8 %   Platelets 206  150 - 400 K/uL     Studies/Results: @RISRSLT24 @     . [EXPIRED] 0.9 % NaCl with KCl 20 mEq / L      . [COMPLETED] alvimopan  12 mg Oral Once  . alvimopan  12 mg Oral BID  . buPROPion  150 mg Oral q morning - 10a  . cloNIDine  0.1 mg Oral Daily  . cloNIDine  0.2 mg Oral QHS  . donepezil  10 mg Oral q morning - 10a  . [COMPLETED] ertapenem  1 g Intravenous On Call  to OR  . ertapenem (INVANZ) IV  1 g Intravenous Q24H  . [COMPLETED] heparin  5,000 Units Subcutaneous Once  . heparin  5,000 Units Subcutaneous Q8H  . HYDROmorphone PCA 0.3 mg/mL   Intravenous Q4H  . pantoprazole  40 mg Oral Daily  . propranolol  10 mg Oral TID  . [DISCONTINUED] clonazePAM  0.5 mg Oral TID  . [DISCONTINUED] cloNIDine  0.1-0.2 mg Oral BID  . [DISCONTINUED] Melatonin  1 tablet Oral QHS  . [DISCONTINUED] QUEtiapine Fumarate  150 mg Oral QHS     Assessment/Plan: s/p Procedure(s): LAPAROSCOPIC SIGMOID COLECTOMY PARTIAL COLECTOMY  POD #1, laparoscopic assisted low anterior resection for diverticulitis. Stable. Begin clear liquids Mobilize  Neuropsychiatric disorder, followed by Dr. Vonna Drafts and by a psychiatrist. On numerous medications including Aricept, Wellbutrin, Seroquel, Clonopin.  We'll hold Seroquel and Clonopin today because of concerns of increasing disorientation.  Coronary artery disease, history of MI 1999, preserved left ventricular function, stable. Continue beta blockers  GERD. Continue proton pump inhibitors    LOS: 1 day     Demari Kropp M. Derrell Lolling, M.D., [facility] General and Minimally invasive Surgery Breast and Colorectal Surgery Office:   [phone] Pager:   [phone]  02/27/2012  . .prob

## 2012-02-28 ENCOUNTER — Encounter (HOSPITAL_COMMUNITY): Payer: Self-pay | Admitting: General Surgery

## 2012-02-28 ENCOUNTER — Inpatient Hospital Stay (HOSPITAL_COMMUNITY): Payer: BC Managed Care – PPO

## 2012-02-28 DIAGNOSIS — I10 Essential (primary) hypertension: Secondary | ICD-10-CM | POA: Diagnosis present

## 2012-02-28 DIAGNOSIS — R410 Disorientation, unspecified: Secondary | ICD-10-CM | POA: Diagnosis present

## 2012-02-28 DIAGNOSIS — F419 Anxiety disorder, unspecified: Secondary | ICD-10-CM | POA: Diagnosis present

## 2012-02-28 LAB — BLOOD GAS, ARTERIAL
Drawn by: 235321
O2 Content: 2 L/min
O2 Saturation: 96.9 %
Patient temperature: 98.6
pH, Arterial: 7.404 (ref 7.350–7.450)

## 2012-02-28 MED ORDER — ALBUTEROL SULFATE (5 MG/ML) 0.5% IN NEBU
2.5000 mg | INHALATION_SOLUTION | Freq: Four times a day (QID) | RESPIRATORY_TRACT | Status: DC | PRN
  Administered 2012-02-28: 2.5 mg via RESPIRATORY_TRACT

## 2012-02-28 MED ORDER — POLYVINYL ALCOHOL 1.4 % OP SOLN
1.0000 [drp] | OPHTHALMIC | Status: DC | PRN
Start: 2012-02-28 — End: 2012-03-04
  Administered 2012-02-28: 2 [drp] via OPHTHALMIC
  Filled 2012-02-28 (×2): qty 15

## 2012-02-28 MED ORDER — MORPHINE SULFATE 2 MG/ML IJ SOLN
1.0000 mg | INTRAMUSCULAR | Status: DC | PRN
  Administered 2012-02-28: 1 mg via INTRAVENOUS
  Filled 2012-02-28: qty 1

## 2012-02-28 NOTE — Progress Notes (Addendum)
2 Days Post-Op  Subjective: Has been awake and stable during the night. Somnolent currently due to recent dose of Dilaudid via PCA. No nausea or vomiting. Good urine output. Foley catheter just discontinued.  Maximum temp 100.3. Vital signs stable. Her pathology report shows diverticulitis, no cancer seen.  Objective: Vital signs in last 24 hours: Temp:  [97.9 F (36.6 C)-100.3 F (37.9 C)] 98.8 F (37.1 C) (11/22 0617) Pulse Rate:  [78-98] 86  (11/22 0617) Resp:  [18-27] 19  (11/22 0617) BP: (108-175)/(68-117) 148/82 mmHg (11/22 0617) SpO2:  [95 %-100 %] 97 % (11/22 0617)    Intake/Output from previous day: 11/21 0701 - 11/22 0700 In: 480 [P.O.:480] Out: 2640 [Urine:2600; Drains:40] Intake/Output this shift: Total I/O In: -  Out: 1270 [Urine:1250; Drains:20]  General appearance: somnolent. Arousable. Opens eyes and looked around. Verbalizes a little. Resp: clear to auscultation bilaterally GI: abdomen is soft. Midline incision is clean. A little bit of dried blood on bandage. JP drainage serosanguineous, much less than yesterday.  Lab Results:  No results found for this or any previous visit (from the past 24 hour(s)).   Studies/Results: @RISRSLT24 @     . alvimopan  12 mg Oral BID  . buPROPion  150 mg Oral q morning - 10a  . cloNIDine  0.1 mg Oral Daily  . cloNIDine  0.2 mg Oral QHS  . donepezil  10 mg Oral q morning - 10a  . [COMPLETED] ertapenem (INVANZ) IV  1 g Intravenous Q24H  . heparin  5,000 Units Subcutaneous Q8H  . HYDROmorphone PCA 0.3 mg/mL   Intravenous Q4H  . pantoprazole  40 mg Oral Daily  . propranolol  10 mg Oral TID  . [DISCONTINUED] clonazePAM  0.5 mg Oral TID  . [DISCONTINUED] QUEtiapine Fumarate  150 mg Oral QHS     Assessment/Plan: s/p Procedure(s): LAPAROSCOPIC SIGMOID COLECTOMY PARTIAL COLECTOMY  POD #2, laparoscopic assisted low anterior resection for diverticulitis.  Stable.  Seems a little over sedated. Will cut back on narcotics  and discontinue PCA.  clear liquids  Mobilize Decrease IV fluids a bit. Check lab work tomorrow morning.    Neuropsychiatric disorder, early dementia, followed by Dr. Alwyn Ren and by a psychiatrist. On numerous medications including Aricept, Wellbutrin, Seroquel, Clonopin. We'll hold Seroquel and Klonopin  because of concerns of increasing disorientation.Spoke with daughter who thinks mental status worse than baseline. Will ask [facility] to see to evaluate altered mental status.   Coronary artery disease, history of MI 1999, preserved left ventricular function, stable. Continue beta blockers   GERD. Continue proton pump inhibitors  I will be gone for the next 9 days. My partners in [place] surgery will care for her.     LOS: 2 days    Cana Mignano M. Derrell Lolling, M.D., [facility] General and Minimally invasive Surgery Breast and Colorectal Surgery Office:   [phone] Pager:   [phone]  02/28/2012  . .prob

## 2012-02-28 NOTE — Progress Notes (Signed)
Patient ID: [name], female   DOB: [birth year], 71 y.o.   MRN: [id number] 2 Days Post-Op  Subjective: I was asked to see patient by nursing do to increased somnolence and increased respiratory difficulty. She is 2 days post laparoscopic low anterior resection of the colon. This morning she was noted by Dr. Derrell Lolling to be more somnolent possibly secondary to narcotics. All narcotics and sedatives have been stopped. She has remained somewhat somnolent. She was noted to have some audible wheezing and rales this evening. Respiratory therapy saw the patient and rapid response team was called. The patient is very somnolent but does respond to voice. She denies pain in her chest or abdomen. She does state she somewhat short of breath.  Objective: Vital signs in last 24 hours: Temp:  [98.1 F (36.7 C)-100.1 F (37.8 C)] 98.9 F (37.2 C) (11/22 2128) Pulse Rate:  [73-111] 73  (11/22 2128) Resp:  [18-26] 18  (11/22 2128) BP: (139-175)/(82-96) 172/83 mmHg (11/22 2128) SpO2:  [93 %-100 %] 99 % (11/22 2128)    Intake/Output from previous day: 11/21 0701 - 11/22 0700 In: 480 [P.O.:480] Out: 2640 [Urine:2600; Drains:40] Intake/Output this shift: Total I/O In: -  Out: 450 [Urine:450]  General appearance: fatigued, mild distress and somnolent but arouses to voice Resp: rhonchi bilaterally, wheezes bilaterally and poor respiratory effort with shallow ventilation. Cardio: regular rate and rhythm GI: normal findings: soft, non-tender Extremities: no significant edema Skin: Skin color, texture, turgor normal. No rashes or lesions or wwarm and dry Incision/Wound: dressings intact without bleeding or drain  Lab Results:   Basename 02/27/12 0431  WBC 13.9*  HGB 12.5  HCT 35.7*  PLT 206   BMET  Basename 02/27/12 0431 02/26/12 1555  NA 136 --  K 4.3 --  CL 101 --  CO2 24 --  GLUCOSE 101* --  BUN 9 --  CREATININE 0.73 0.72  CALCIUM 8.8 --   ABGs: PH 7.404 PCO2 42.5 PO2 82.9 on nasal  O2  Studies/Results: No results found.  Anti-infectives: Anti-infectives     Start     Dose/Rate Route Frequency Ordered Stop   02/27/12 0900   ertapenem (INVANZ) 1 g in sodium chloride 0.9 % 50 mL IVPB        1 g 100 mL/hr over 30 Minutes Intravenous Every 24 hours 02/26/12 1458 02/27/12 0930   02/26/12 0713   ertapenem (INVANZ) 1 g in sodium chloride 0.9 % 50 mL IVPB        1 g 100 mL/hr over 30 Minutes Intravenous On call to O.R. 02/26/12 2952 02/26/12 0849          Assessment/Plan: s/p Procedure(s): LAPAROSCOPIC SIGMOID COLECTOMY PARTIAL COLECTOMY Persistent somnolence and increasing respiratory difficulty. This may represent secretions she is unable to clear or possibly pulmonary edema.  Portable chest x-ray has been ordered and is pending. Check EKG and cardiac enzymes.  Transfer patient to the ICU for close monitoring and ask pulmonary/CCM to evaluate. She denies abdominal pain in her abdomen seems benign and at this point I do not see evidence for an abdominal source of her decline. Discussed situation with the patient's daughter.   LOS: 2 days    Rufina Kimery T 02/28/2012

## 2012-02-28 NOTE — Consult Note (Signed)
Patient's PCP: Marga Melnick, MD Consulting physician: Dr. Derrell Lolling  Chief Complaint/reason for the consult: Altered mental status  History of Present Illness: [name] is a 71 y.o. Caucasian female history of diverticular disease with multiple episodes of diverticulitis requiring antibiotics, irritable bowel syndrome, depression, anxiety, fibromyalgia, GERD, essential tremors, hyperlipidemia, and left eye cataract surgery who on 02/26/2012 had laparoscopic take down of splenic flexure and low anterior resection for diverticulitis.  This morning patient was found to be somnolent and confused, Dilaudid PCA was discontinued and the hospitalist service was consult for further care and management.  Daughter and husband in the room.  Daughter noted that this morning patient had trouble recognizing her and called out her other daughter's name.  Reports feeling anxious, denies any chest pain or shortness of breath.  Abdominal pain under control.  Complaining of blurry vision.  Past Medical History  Diagnosis Date  . Abnormal weight gain   . Unspecified adverse effect of unspecified drug, medicinal and biological substance   . Migraine, unspecified, without mention of intractable migraine without mention of status migrainosus   . Abdominal bruit   . Allergic rhinitis, cause unspecified   . Dehydration   . Diverticular disease     acute diverticulitis 10/13/11  . CAD (coronary artery disease)     diaphragmatic wall infarction in 1999, treated with balloon angioplasty with nonobstructive disease at ast catheterization in 2007 as described above  . Hyperlipemia     with low HDL.  . IBS (irritable bowel syndrome)   . GERD (gastroesophageal reflux disease)   . Fibromyalgia   . Tremor   . Acute myocardial infarction of anterolateral wall, subsequent episode of care 1999  . Depression    Past Surgical History  Procedure Date  . Abdominal surgery      @ [facility]  in [place] in 2002  for ptosis of organs;  mersilene mesh sling, cystocopy  by Dr  Earlene Plater, Urologist  . Colonoscopy 2011    Diverticulosis & hemorrhoids; Dr Juanda Chance  . [place]le     RECTOCOELE  . Bladder surgery patient unsure of date    sling  . Cataract extraction     left eye  . Partial hysterectomy   . Tonsillectomy as child  . Abdominal hysterectomy 1979    partial   Family History  Problem Relation Age of Onset  . Heart attack Brother 33    1/2 BROTHER  . Bipolar disorder Sister   . Heart attack Sister 57  . Heart attack Father 25  . Ulcers Father   . Stroke Sister     1/2 SISTER  . Alcohol abuse Mother   . Colon cancer Neg Hx    History   Social History  . Marital Status: Married    Spouse Name: N/A    Number of Children: N/A  . Years of Education: N/A   Occupational History  . Retired Runner, broadcasting/film/video    Social History Main Topics  . Smoking status: Never Smoker   . Smokeless tobacco: Never Used     Comment: second hand smoke for decades  . Alcohol Use: No  . Drug Use: No  . Sexually Active: Not on file   Other Topics Concern  . Not on file   Social History Narrative   High fiber diet   Allergies: Amphetamine-dextroamphetamine; Metronidazole; Penicillins; Atorvastatin; Azithromycin; Cephalexin; Chlorpromazine hcl; Ciprofloxacin; Doxycycline; Ezetimibe; Hyoscyamine sulfate; Lactose intolerance (gi); Lidocaine; Oatmeal; Sulfamethoxazole; Telithromycin; Topiramate; and Latex  Home Meds: Prior to Admission  medications   Medication Sig Start Date End Date Taking? Authorizing Provider  acetaminophen (TYLENOL) 650 MG CR tablet Take 1,300 mg by mouth 2 (two) times daily.   Yes Historical Provider, MD  buPROPion (WELLBUTRIN XL) 150 MG 24 hr tablet Take 150 mg by mouth every morning.  11/25/11  Yes Historical Provider, MD  clonazePAM (KLONOPIN) 0.5 MG tablet Take 0.5 mg by mouth 3 (three) times daily.    Yes Historical Provider, MD  cloNIDine (CATAPRES) 0.1 MG tablet Take 0.1-0.2 mg by mouth 2 (two) times  daily. 1 BY MOUTH IN THE AM, 2 BY MOUTH IN THE PM   Yes Historical Provider, MD  Coenzyme Q10 (COQ10) 100 MG CAPS Take 1 capsule by mouth daily.    Yes Historical Provider, MD  diclofenac (VOLTAREN) 50 MG EC tablet Take 50 mg by mouth every morning.    Yes Historical Provider, MD  donepezil (ARICEPT) 10 MG tablet Take 10 mg by mouth every morning.   Yes Historical Provider, MD  esomeprazole (NEXIUM) 40 MG capsule Take 40 mg by mouth at bedtime.   Yes Historical Provider, MD  fish oil-omega-3 fatty acids 1000 MG capsule Take 2 g by mouth daily.  10/25/10  Yes Laurey Morale, MD  loratadine (CLARITIN) 10 MG tablet Take 10 mg by mouth every morning.    Yes Historical Provider, MD  Melatonin 5 MG TABS Take 1 tablet by mouth at bedtime.    Yes Historical Provider, MD  polyethylene glycol (MIRALAX / GLYCOLAX) packet Take 17 g by mouth at bedtime.   Yes Historical Provider, MD  propranolol (INDERAL) 10 MG tablet Take 10 mg by mouth 3 (three) times daily.  12/26/11  Yes Laurey Morale, MD  QUEtiapine Fumarate (SEROQUEL XR) 150 MG 24 hr tablet Take 150 mg by mouth at bedtime.   Yes Historical Provider, MD  rosuvastatin (CRESTOR) 20 MG tablet Take 20 mg by mouth at bedtime.   Yes Historical Provider, MD  aspirin EC 81 MG tablet Take 81 mg by mouth every morning.  12/26/11   Laurey Morale, MD    Review of Systems: All systems reviewed with the patient and positive as per history of present illness, otherwise all other systems are negative.  Physical Exam: Blood pressure 139/86, pulse 111, temperature 98.1 F (36.7 C), temperature source Oral, resp. rate 20, height 5' (1.524 m), weight 61.19 kg (134 lb 14.4 oz), SpO2 94.00%. General: Awake, Oriented to self, and city, No acute distress.  Not oriented to time.  Knew president was [employer]. HEENT: EOMI, Moist mucous membranes Neck: Supple CV: S1 and S2 Lungs: Clear to ascultation bilaterally Abdomen: Soft, Nontender, Nondistended, +bowel  sounds. Ext: Good pulses. Trace edema. No clubbing or cyanosis noted. Neuro: Cranial Nerves II-XII grossly intact. Has 5/5 motor strength in upper and lower extremities.  Lab results:  Basename 02/27/12 0431 02/26/12 1555  NA 136 --  K 4.3 --  CL 101 --  CO2 24 --  GLUCOSE 101* --  BUN 9 --  CREATININE 0.73 0.72  CALCIUM 8.8 --  MG -- --  PHOS -- --   No results found for this basename: AST:2,ALT:2,ALKPHOS:2,BILITOT:2,PROT:2,ALBUMIN:2 in the last 72 hours No results found for this basename: LIPASE:2,AMYLASE:2 in the last 72 hours  Basename 02/27/12 0431  WBC 13.9*  NEUTROABS --  HGB 12.5  HCT 35.7*  MCV 94.2  PLT 206   No results found for this basename: CKTOTAL:3,CKMB:3,CKMBINDEX:3,TROPONINI:3 in the last 72 hours No components found  with this basename: POCBNP:3 No results found for this basename: DDIMER in the last 72 hours No results found for this basename: HGBA1C:2 in the last 72 hours No results found for this basename: CHOL:2,HDL:2,LDLCALC:2,TRIG:2,CHOLHDL:2,LDLDIRECT:2 in the last 72 hours No results found for this basename: TSH,T4TOTAL,FREET3,T3FREE,THYROIDAB in the last 72 hours No results found for this basename: VITAMINB12:2,FOLATE:2,FERRITIN:2,TIBC:2,IRON:2,RETICCTPCT:2 in the last 72 hours Imaging results:  Chest 2 View  02/24/2012  *RADIOLOGY REPORT*  Clinical Data: 71 year old female preoperative study for colectomy.  CHEST - 2 VIEW  Comparison: 01/18/2010.  Findings: Stable lung volumes.  Cardiac size and mediastinal contours are within normal limits.  Visualized tracheal air column is within normal limits.  Lung parenchyma is stable and clear.  No pneumothorax or effusion.  No pneumoperitoneum. Stable visualized osseous structures.  IMPRESSION: No acute cardiopulmonary abnormality.   Original Report Authenticated By: Erskine Speed, M.D.    Other results:  Assessment & Plan by Problem: Acute delirium on top of dementia? Likely multifactorial, patient at  times provided details of her own medical care. Likely due to recent surgery, possible residual anesthesia and pain medications.  Agree with discontinuing clonazepam and Seroquel.  Discussed with patient's husband who reports the patient does not take Wellbutrin as a result will discontinue as well.  Minimize pain medications as much as possible, Dilaudid PCA discontinued by surgery and transitioned to IV pain medications as needed.  Discussed with patient, husband and daughter about doing a head CT if patient's mentation continues to get worse, but hold off for now.  Suspect mentation will improve as opiates are minimized.  Patient also has component of anxiety which could be contributing to delirium.  Patient on donepezil, initiated by her outpatient psychiatrist? for questionable history of dementia, continue for now.  Patient has had urinalysis and chest x-ray done prior to admission, which was negative for any infectious etiology.  Patient has received her ertapenem on 02/26/2012 and 02/27/2012, do not see the need to initiate infectious workup.  If patient has fevers consider further infectious workup.  Patient on alvimopan post operatively, which may cause confusion, will defer to surgery if the would want to discontinue this medication.  Hypertension Stable.  Continue clonidine.  Continue propranolol and clonidine.  Hyperlipidemia Statin held.  Could consider resuming on discharge.  Blurry vision Started on artificial tears.  Suspect patient has difficulty focusing her eyes due to delirium and opiates.  Reports blurry vision more in the left eye, likely due to history of cataract surgery on that eye.  Reports having had seen an eye doctor about a month ago and was told everything was okay.  Essential tremors Continue propanolol.  Reports having had an outpatient workup for possible Parkinson's, was told it was not likely due to Parkinson's.  Anxiety/depression/fibromyalgia Defer medications to  her psychiatrist/primary care physician.  As mentation improves consider restarting patient on Seroquel.  GERD Continue PPI.  Laparoscopic take down of splenic flexure and low anterior resection for diverticulitis Management per surgery. Patient on alvimopan as per general surgery.  Coronary artery disease Aspirin held by surgery.  Resume when possible.  Prophylaxis SQ heparin.  Thank you for the consult.  Will continue to follow.  Time spent on consult, talking to the patient, and coordinating care was: 60 mins.  Daysi Boggan A, MD 02/28/2012, 12:24 PM

## 2012-02-28 NOTE — Progress Notes (Signed)
Called MD for breathing tx since pt could not deep breathe and wheezing was heard throughout.  After breathing tx, there was no improvement, crackles heard, tachypneic, and respiratory therapy recommended calling RRT.  RRT called.  Sherron Monday

## 2012-02-29 DIAGNOSIS — K219 Gastro-esophageal reflux disease without esophagitis: Secondary | ICD-10-CM

## 2012-02-29 DIAGNOSIS — R4182 Altered mental status, unspecified: Secondary | ICD-10-CM

## 2012-02-29 DIAGNOSIS — R0989 Other specified symptoms and signs involving the circulatory and respiratory systems: Secondary | ICD-10-CM

## 2012-02-29 DIAGNOSIS — I379 Nonrheumatic pulmonary valve disorder, unspecified: Secondary | ICD-10-CM

## 2012-02-29 DIAGNOSIS — R0609 Other forms of dyspnea: Secondary | ICD-10-CM

## 2012-02-29 DIAGNOSIS — R404 Transient alteration of awareness: Secondary | ICD-10-CM

## 2012-02-29 DIAGNOSIS — I10 Essential (primary) hypertension: Secondary | ICD-10-CM

## 2012-02-29 DIAGNOSIS — G250 Essential tremor: Secondary | ICD-10-CM

## 2012-02-29 DIAGNOSIS — Z9889 Other specified postprocedural states: Secondary | ICD-10-CM

## 2012-02-29 LAB — BASIC METABOLIC PANEL
Calcium: 9.2 mg/dL (ref 8.4–10.5)
Chloride: 94 mEq/L — ABNORMAL LOW (ref 96–112)
Creatinine, Ser: 0.56 mg/dL (ref 0.50–1.10)
GFR calc Af Amer: >90 mL/min (ref >90)
GFR calc Af Amer: >90 mL/min (ref >90)
GFR calc non Af Amer: >90 mL/min (ref >90)
Potassium: 3.9 mEq/L (ref 3.5–5.1)
Sodium: 135 mEq/L (ref 135–145)

## 2012-02-29 LAB — TROPONIN I: Troponin I: <0.3 ng/mL (ref <0.30)

## 2012-02-29 LAB — CBC
MCH: 32.7 pg (ref 26.0–34.0)
MCHC: 34.1 g/dL (ref 30.0–36.0)
MCV: 95.8 fL (ref 78.0–100.0)
Platelets: 190 10*3/uL (ref 150–400)
Platelets: 180 10*3/uL (ref 150–400)
RDW: 13.2 % (ref 11.5–15.5)
RDW: 13.3 % (ref 11.5–15.5)
WBC: 12 10*3/uL — ABNORMAL HIGH (ref 4.0–10.5)

## 2012-02-29 LAB — GLUCOSE, CAPILLARY: Glucose-Capillary: 127 mg/dL — ABNORMAL HIGH (ref 70–99)

## 2012-02-29 MED ORDER — POTASSIUM CHLORIDE IN NACL 20-0.9 MEQ/L-% IV SOLN
INTRAVENOUS | Status: DC
  Administered 2012-03-01: 09:00:00 via INTRAVENOUS
  Filled 2012-02-29 (×2): qty 1000

## 2012-02-29 MED ORDER — ALBUTEROL SULFATE (5 MG/ML) 0.5% IN NEBU
2.5000 mg | INHALATION_SOLUTION | RESPIRATORY_TRACT | Status: DC
  Administered 2012-02-29 – 2012-03-02 (×13): 2.5 mg via RESPIRATORY_TRACT
  Filled 2012-02-29 (×14): qty 0.5

## 2012-02-29 NOTE — Progress Notes (Addendum)
[facility] PROGRESS NOTE  [name] ZOX:[id number] DOB: 10/12/1940 DOA: 02/26/2012 PCP: Marga Melnick, MD  Assessment/Plan: Acute delirium on top of dementia?  Likely multifactorial, patient at times provided details of her own medical care. Likely due to recent surgery, possible residual anesthesia and pain medications. -continue been judicious about narcotics and avoid benzo's -Consider early discontinuation of alvimopan post operatively, which may cause confusion; especially if mentation failed to improved further. -continue holding seroquel and clonazepam -Patient is overall better and more alert; today even oriented X2 (see physical exam below for details) -If mental status deteriorates will get CT head; meanwhile continue avoiding meds that can affect her sensorium and follow clinical response.   Hypertension  Stable. Continue clonidine and propranolol  Hyperlipidemia  Statin held. Resume them at discharge.   Blurry vision  Continue artificial tears.  No complaints of blurred vision today. To follow with ophthalmology as an outpatient  Essential tremors  Continue propanolol.   Anxiety/depression/fibromyalgia  Defer medications to her psychiatrist/primary care physician.  As mentation improves consider restarting patient on Seroquel.   GERD  Continue PPI.   Laparoscopic take down of splenic flexure and low anterior resection for diverticulitis  Management per surgery. Patient on alvimopan as per general surgery (known to cause confusion; consider stopping it if mentation fail to improved).   Coronary artery disease  -Aspirin held by surgery. Resume when possible. -No CP, EKG w/o acute ischemic changes  Work of breathing -Able to protect airways -Less tachypnea and with good O2 sat on RA. -Continue pulm toiletry -Continue flutter valve -Pulmonary service on board.   DVT: heparin  Code Status: Full Family Communication: no family at  bedside Disposition Plan: Will follow patient along for acute encephalopathy.   Consultants: Attending is CCS  CCM  TR[facility]  Procedures:  Laparoscopic partial colectomy (Left)  Antibiotics:  ertapenem (given for prophylaxis)  HPI/Subjective: Afebrile, no CP, no nausea or vomiting. Patient is AAOX2; no focal neurologic deficit appreciated. She has hx of dementia and not sure what is baseline mentation. No family at bedside.  Objective: Filed Vitals:   02/29/12 0700 02/29/12 0753 02/29/12 0800 02/29/12 1211  BP: 146/67  155/75   Pulse: 70  72   Temp:   98.7 F (37.1 C)   TempSrc:   Oral   Resp: 26  30   Height:      Weight:      SpO2: 99% 96% 100% 98%    Intake/Output Summary (Last 24 hours) at 02/29/12 1220 Last data filed at 02/29/12 0800  Gross per 24 hour  Intake  647.5 ml  Output   1450 ml  Net -802.5 ml   Filed Weights   02/26/12 1500 02/29/12 0000  Weight: 61.19 kg (134 lb 14.4 oz) 63.549 kg (140 lb 1.6 oz)    Exam:   General:  NAD, afebrile; AAOX2 intermittently (person and situation, knew she was here for abd surgery; but thought she was a church and could not remember time/date or anything else appropriate). Able to follow commands.  Cardiovascular: S1 and S2, no rubs or gallops  Respiratory: tachypnea, shallow breathing pattern; no wheezing and no rhonchi on exam.  Abdomen: soft, with clean dressings and JP drain in place; no tenderness or distension appreciated.  Neuro: CN intact, follow commands, no focal deficit.  Data Reviewed: Basic Metabolic Panel:  Lab 02/29/12 8119 02/29/12 0036 02/27/12 0431 02/26/12 1555 02/24/12 1015  NA 132* 135 136 -- 137  K 3.9 4.1 4.3 --  3.7  CL 94* 98 101 -- 99  CO2 28 29 24  -- 27  GLUCOSE 121* 121* 101* -- 103*  BUN 7 7 9  -- 22  CREATININE 0.47* 0.56 0.73 0.72 1.04  CALCIUM 9.0 9.2 8.8 -- 9.9  MG -- -- -- -- --  PHOS -- -- -- -- --   Liver Function Tests:  Lab 02/24/12 1015  AST 21  ALT 19  ALKPHOS  89  BILITOT 0.4  PROT 7.0  ALBUMIN 3.9   CBC:  Lab 02/29/12 0355 02/29/12 0036 02/27/12 0431 02/24/12 1015  WBC 12.0* 14.2* 13.9* 6.3  NEUTROABS -- -- -- 3.3  HGB 11.3* 11.8* 12.5 14.6  HCT 32.8* 34.6* 35.7* 42.6  MCV 94.8 95.8 94.2 94.5  PLT 180 190 206 223   Cardiac Enzymes:  Lab 02/29/12 0355  CKTOTAL --  CKMB --  CKMBINDEX --  TROPONINI <0.30   CBG:  Lab 02/29/12 0007  GLUCAP 127*    Recent Results (from the past 240 hour(s))  SURGICAL PCR SCREEN     Status: Normal   Collection Time   02/24/12  9:45 AM      Component Value Range Status Comment   MRSA, PCR NEGATIVE  NEGATIVE Final    Staphylococcus aureus NEGATIVE  NEGATIVE Final   MRSA PCR SCREENING     Status: Normal   Collection Time   02/28/12 11:57 PM      Component Value Range Status Comment   MRSA by PCR NEGATIVE  NEGATIVE Final      Studies: Dg Chest Port 1 View  02/28/2012  *RADIOLOGY REPORT*  Clinical Data: Respiratory distress, cough and congestion.  PORTABLE CHEST - 1 VIEW  Comparison: 02/24/2012  Findings: Shallow inspiration.  Normal heart size and pulmonary vascularity.  No focal airspace consolidation in the lungs.  No blunting of costophrenic angles.  No pneumothorax.  Mediastinal contours appear intact.  Calcification of the aorta.  Degenerative changes in the spine and shoulders.  No significant change since previous study.  IMPRESSION: No evidence of active pulmonary disease.   Original Report Authenticated By: Burman Nieves, M.D.     Scheduled Meds:   . albuterol  2.5 mg Nebulization Q4H  . alvimopan  12 mg Oral BID  . cloNIDine  0.1 mg Oral Daily  . cloNIDine  0.2 mg Oral QHS  . donepezil  10 mg Oral q morning - 10a  . heparin  5,000 Units Subcutaneous Q8H  . pantoprazole  40 mg Oral Daily  . propranolol  10 mg Oral TID  . [DISCONTINUED] buPROPion  150 mg Oral q morning - 10a   Continuous Infusions:   . 0.9 % NaCl with KCl 20 mEq / L    . [DISCONTINUED] 0.9 % NaCl with KCl 20  mEq / L 75 mL/hr at 02/28/12 1233     Time spent: >30 minutes    Adan Beal  [facility] Pager [phone]. If 8PM-8AM, please contact night-coverage at [email or web address], password [facility] 02/29/2012, 12:20 PM  LOS: 3 days

## 2012-02-29 NOTE — Plan of Care (Signed)
Problem: Phase II Progression Outcomes Goal: Progress activity as tolerated unless otherwise ordered Outcome: Progressing OOB to chair with MaxiSky, tolerated chair position well.

## 2012-02-29 NOTE — Plan of Care (Signed)
Problem: Phase I Progression Outcomes Goal: Initial discharge plan identified Outcome: Progressing Supportive family noted, husband and three children, 2 out 3 children live nearby.  Problem: Phase II Progression Outcomes Goal: Pain controlled Outcome: Completed/Met Date Met:  02/29/12 No c/o pain. Goal: Progress activity as tolerated unless otherwise ordered Outcome: Progressing Pt is extremely weak, for example: unable to hold cup or hug pillow.  Placed in Chair position in bed x 2 so far.  Plan to get pt OOB this afternoon.  Per family, pt has not slept in days, pt is clearly exhausted and needing rest. Pt intermittently falls asleep, better able to when family is out of room. Goal: Progressing with IS, TCDB Outcome: Progressing Pt unable to use IS, multiple attempts, pt very weak.  Flutter valve used by Respiratory.  Pt turned and positioned Q 2hrs.

## 2012-02-29 NOTE — Progress Notes (Signed)
Called to room 1534 per [facility]. Pt with increased WOB, progressively lethargic post op #2 colectomy. Albuterol treatment given per Dr Jamse Mead order with no improvement, Rapid Response called to the room at 2240. Pt found resting in bed with increased WOB. VSS at 2250 hr SR 67 rr 30-23 bp 152/72 po2 100% on 2 LNC. Lethargic, responds to voice, denies pain but will not answer further questions. Pt pupils size 2 sluggish and equally reactive. Lungs with audible rales, pt with a wet cough but unable to produce sputum. ABD soft surgical dressing intact with old shadowing marked. Dr Johna Sheriff paged, ABG and CXR ordered. MD at bedside 2300, pt transferred to ICU/SD room 1222 at 2345. Family at bedside updated on plane of care. CXR obtained upon arrival to unit. CCM MD consulted per Dr Johna Sheriff.

## 2012-02-29 NOTE — Progress Notes (Signed)
Patient ID: [name], female   DOB: 03-Mar-1941, 71 y.o.   MRN: [id number] 3 Days Post-Op  Subjective: More alert this AM.  Denies pain. + flatus  Objective: Vital signs in last 24 hours: Temp:  [98.1 F (36.7 C)-99.6 F (37.6 C)] 98.2 F (36.8 C) (11/23 0400) Pulse Rate:  [69-111] 70  (11/23 0700) Resp:  [18-29] 26  (11/23 0700) BP: (112-172)/(67-86) 146/67 mmHg (11/23 0700) SpO2:  [93 %-100 %] 99 % (11/23 0700) Weight:  [140 lb 1.6 oz (63.549 kg)] 140 lb 1.6 oz (63.549 kg) (11/23 0000)    Intake/Output from previous day: 11/22 0701 - 11/23 0700 In: 4285 [P.O.:240; I.V.:4045] Out: 1275 [Urine:1225; Drains:50] Intake/Output this shift:    General appearance: fatigued, no distress and more alert than last night Resp: clear to auscultation bilaterally GI: normal findings: soft, non-tender Incision/Wound: CLean, wicks in place, JP serosanguinous  Lab Results:   [facility] 02/29/12 0355 02/29/12 0036  WBC 12.0* 14.2*  HGB 11.3* 11.8*  HCT 32.8* 34.6*  PLT 180 190   BMET  Basename 02/29/12 0355 02/29/12 0036  NA 132* 135  K 3.9 4.1  CL 94* 98  CO2 28 29  GLUCOSE 121* 121*  BUN 7 7  CREATININE 0.47* 0.56  CALCIUM 9.0 9.2     Studies/Results: Dg Chest Port 1 View  02/28/2012  *RADIOLOGY REPORT*  Clinical Data: Respiratory distress, cough and congestion.  PORTABLE CHEST - 1 VIEW  Comparison: 02/24/2012  Findings: Shallow inspiration.  Normal heart size and pulmonary vascularity.  No focal airspace consolidation in the lungs.  No blunting of costophrenic angles.  No pneumothorax.  Mediastinal contours appear intact.  Calcification of the aorta.  Degenerative changes in the spine and shoulders.  No significant change since previous study.  IMPRESSION: No evidence of active pulmonary disease.   Original Report Authenticated By: Burman Nieves, M.D.     Anti-infectives: Anti-infectives     Start     Dose/Rate Route Frequency Ordered Stop   02/27/12 0900   ertapenem  (INVANZ) 1 g in sodium chloride 0.9 % 50 mL IVPB        1 g 100 mL/hr over 30 Minutes Intravenous Every 24 hours 02/26/12 1458 02/27/12 0930   02/26/12 0713   ertapenem (INVANZ) 1 g in sodium chloride 0.9 % 50 mL IVPB        1 g 100 mL/hr over 30 Minutes Intravenous On call to O.R. 02/26/12 4098 02/26/12 0849          Assessment/Plan: s/p Procedure(s): LAPAROSCOPIC SIGMOID COLECTOMY PARTIAL COLECTOMY Decreased mental status, prob multifactorial, improved this AM Resp distress secondary to MS, improved OOB, pulm toilet, observe in ICU   LOS: 3 days    Shantera Monts T 02/29/2012

## 2012-02-29 NOTE — Consult Note (Signed)
Name: [name] MRN: [id number] DOB: 12-14-1940    LOS: 3  Referring Provider:  Dr. Johna Sheriff Reason for Referral:  Altered mental status and increased work of breathing  PULMONARY / CRITICAL CARE MEDICINE  HPI:  [name] is a 71 y/o woman with past medical history significant for diverticulitis, essential tremor, dementia and depression who underwent laproscopic colectomy for diverticulitis.  She had been more somnolent throughout the day and her pain medications (dilaudid PCA) were held.  She was rapid responded on the evening of 11/22 to the ICU for increased somnolence and worsening respiratory status.  A chest x-ray was obtained which shows no infiltrate.  [name] currently has oxygen saturations of 97-99% on 2L [place].  She was noted to have both wheezes and crackles on exam.  Of note she is on seroquel and this was held for change in mental status.   Past Medical History  Diagnosis Date  . Abnormal weight gain   . Unspecified adverse effect of unspecified drug, medicinal and biological substance   . Migraine, unspecified, without mention of intractable migraine without mention of status migrainosus   . Abdominal bruit   . Allergic rhinitis, cause unspecified   . Dehydration   . Diverticular disease     acute diverticulitis 10/13/11  . CAD (coronary artery disease)     diaphragmatic wall infarction in 1999, treated with balloon angioplasty with nonobstructive disease at ast catheterization in 2007 as described above  . Hyperlipemia     with low HDL.  . IBS (irritable bowel syndrome)   . GERD (gastroesophageal reflux disease)   . Fibromyalgia   . Tremor   . Acute myocardial infarction of anterolateral wall, subsequent episode of care 1999  . Depression    Past Surgical History  Procedure Date  . Abdominal surgery      @ [facility]  in [place] in 2002  for ptosis of organs; mersilene mesh sling, cystocopy  by Dr  Earlene Plater, Urologist  . Colonoscopy 2011    Diverticulosis & hemorrhoids; Dr  Juanda Chance  . [place]     RECTOCOELE  . Bladder surgery patient unsure of date    sling  . Cataract extraction     left eye  . Partial hysterectomy   . Tonsillectomy as child  . Abdominal hysterectomy 1979    partial  . Laparoscopic sigmoid colectomy 02/26/2012    Procedure: LAPAROSCOPIC SIGMOID COLECTOMY;  Surgeon: Ernestene Mention, MD;  Location: [facility] ORS;  Service: General;  Laterality: N/A;  Laparoscopic Assisted Sigmoid Colectomy  . Partial colectomy 02/26/2012    Procedure: PARTIAL COLECTOMY;  Surgeon: Ernestene Mention, MD;  Location: [facility] ORS;  Service: General;  Laterality: N/A;  Sigmoid Colectomy   Prior to Admission medications   Medication Sig Start Date End Date Taking? Authorizing Provider  acetaminophen (TYLENOL) 650 MG CR tablet Take 1,300 mg by mouth 2 (two) times daily.   Yes Historical Provider, MD  buPROPion (WELLBUTRIN XL) 150 MG 24 hr tablet Take 150 mg by mouth every morning.  11/25/11  Yes Historical Provider, MD  clonazePAM (KLONOPIN) 0.5 MG tablet Take 0.5 mg by mouth 3 (three) times daily.    Yes Historical Provider, MD  cloNIDine (CATAPRES) 0.1 MG tablet Take 0.1-0.2 mg by mouth 2 (two) times daily. 1 BY MOUTH IN THE AM, 2 BY MOUTH IN THE PM   Yes Historical Provider, MD  Coenzyme Q10 (COQ10) 100 MG CAPS Take 1 capsule by mouth daily.    Yes Historical  Provider, MD  diclofenac (VOLTAREN) 50 MG EC tablet Take 50 mg by mouth every morning.    Yes Historical Provider, MD  donepezil (ARICEPT) 10 MG tablet Take 10 mg by mouth every morning.   Yes Historical Provider, MD  esomeprazole (NEXIUM) 40 MG capsule Take 40 mg by mouth at bedtime.   Yes Historical Provider, MD  fish oil-omega-3 fatty acids 1000 MG capsule Take 2 g by mouth daily.  10/25/10  Yes Laurey Morale, MD  loratadine (CLARITIN) 10 MG tablet Take 10 mg by mouth every morning.    Yes Historical Provider, MD  Melatonin 5 MG TABS Take 1 tablet by mouth at bedtime.    Yes Historical Provider, MD  polyethylene  glycol (MIRALAX / GLYCOLAX) packet Take 17 g by mouth at bedtime.   Yes Historical Provider, MD  propranolol (INDERAL) 10 MG tablet Take 10 mg by mouth 3 (three) times daily.  12/26/11  Yes Laurey Morale, MD  QUEtiapine Fumarate (SEROQUEL XR) 150 MG 24 hr tablet Take 150 mg by mouth at bedtime.   Yes Historical Provider, MD  rosuvastatin (CRESTOR) 20 MG tablet Take 20 mg by mouth at bedtime.   Yes Historical Provider, MD  aspirin EC 81 MG tablet Take 81 mg by mouth every morning.  12/26/11   Laurey Morale, MD   Allergies Allergies  Allergen Reactions  . Amphetamine-Dextroamphetamine Other (See Comments)    unknowb reaction  . Metronidazole     REACTION: SORES IN MOUTH, severe diarrhea  . Penicillins     RASH  . Atorvastatin Other (See Comments)    Upset stomach  . Azithromycin     Unknown reaction  . Cephalexin     Unknown reaction  . Chlorpromazine Hcl     Unknown reaction  . Ciprofloxacin     REACTION: SEVERE PAIN,DEHYDRATION,DIARRHEA  . Doxycycline Diarrhea  . Ezetimibe     REACTION: JOINT/MUSCLE ACHES  . Hyoscyamine Sulfate     Unknown reaction  . Lactose Intolerance (Gi) Diarrhea  . Lidocaine Swelling  . Oatmeal Other (See Comments)     Sinus Congestion   . Sulfamethoxazole     REACTION: GI upset-acid reflux  . Telithromycin     Unknown reaction  . Topiramate     Unknown reaction  . Latex Rash    Family History Family History  Problem Relation Age of Onset  . Heart attack Brother 25    1/2 BROTHER  . Bipolar disorder Sister   . Heart attack Sister 12  . Heart attack Father 36  . Ulcers Father   . Stroke Sister     1/2 SISTER  . Alcohol abuse Mother   . Colon cancer Neg Hx    Social History  reports that she has never smoked. She has never used smokeless tobacco. She reports that she does not drink alcohol or use illicit drugs.  Review Of Systems:  All systems were reviewed and were negative except as stated in the HPI  Brief patient description:   71 y/o s/p laproscopic colectomy now with altered mental status  Events Since Admission: Rapid response to ICU  Current Status:  Vital Signs: Temp:  [98.1 F (36.7 C)-100.1 F (37.8 C)] 99.4 F (37.4 C) (11/23 0000) Pulse Rate:  [73-111] 73  (11/22 2128) Resp:  [18-26] 18  (11/22 2128) BP: (139-175)/(82-96) 172/83 mmHg (11/22 2128) SpO2:  [93 %-100 %] 99 % (11/22 2128) Weight:  [63.549 kg (140 lb 1.6 oz)] 63.549 kg (140  lb 1.6 oz) (11/23 0000)  Physical Examination: General:  Sitting in bed in no acute distress Neuro:  Slow to answer questions but answers appropriately HEENT:  PERRL, EOMI, OP clear Neck:  Supple, no masses Cardiovascular:  NRRR, no mrg Lungs:  Diffuse bilateral faint wheezes and upper airway noise heard throughout, no fine crackles Abdomen:  +BS, slightly tender to palpation, non-distended, JP drain in place Musculoskeletal:  No joint abnormalities, no significant LE edema Skin:  No rash  Active Problems:  HYPERLIPIDEMIA-MIXED  CAD  ALLERGIC RHINITIS  GERD  Irritable bowel syndrome  TREMOR, ESSENTIAL  Diverticulitis  Anxiety  Acute delirium  Hypertension   ASSESSMENT AND PLAN  PULMONARY  Lab 02/28/12 2255  PHART 7.404  PCO2ART 42.5  PO2ART 82.9  HCO3 26.0*  O2SAT 96.9   Ventilator Settings:   CXR:  No obvious infiltrate, no evidence of pulmonary edema ETT:  none  A:  Increased work of breathing P:   Pt with increased work of breathing post op and with altered mental status.  She is at risk of aspiration with altered mental status.  She also does complain of pain with cough which may lead to decreased airway clearance.  She does not currently have any clinical or radiologic signs of pneumonia but it would be good to encourage airway clearance with an incentive spirometer and flutter valve.  These have been ordered.  She is also wheezing currently.  Albuterol nebulizer treatments have been ordered.   CARDIOVASCULAR No results found for  this basename: TROPONINI:5,LATICACIDVEN:5, O2SATVEN:5,PROBNP:5 in the last 168 hours ECG:  NSR Lines: none  A: Shortness of breath P:  Troponins are pending  RENAL  Lab 02/27/12 0431 02/26/12 1555 02/24/12 1015  NA 136 -- 137  K 4.3 -- 3.7  CL 101 -- 99  CO2 24 -- 27  BUN 9 -- 22  CREATININE 0.73 0.72 1.04  CALCIUM 8.8 -- 9.9  MG -- -- --  PHOS -- -- --   Intake/Output      11/22 0701 - 11/23 0700   P.O. 240   I.V. (mL/kg) 3975 (62.6)   Total Intake(mL/kg) 4215 (66.3)   Urine (mL/kg/hr) 800 (0.5)   Drains 35   Total Output 835   Net +3380       Urine Occurrence 550 x   Stool Occurrence 1 x    Foley:  In place  A:  Volume overload P:   Monitor volume status.  She is 3L positive today however she does not have any LE edema.  Consider lasix for decreased urine output if needed.   GASTROINTESTINAL  Lab 02/24/12 1015  AST 21  ALT 19  ALKPHOS 89  BILITOT 0.4  PROT 7.0  ALBUMIN 3.9    A:  S/P colectomy P:   Management per surgery  HEMATOLOGIC  Lab 02/27/12 0431 02/24/12 1015  HGB 12.5 14.6  HCT 35.7* 42.6  PLT 206 223  INR -- --  APTT -- --   A:  No acute issues P:  Monitor h/h   INFECTIOUS  Lab 02/27/12 0431 02/24/12 1015  WBC 13.9* 6.3  PROCALCITON -- --   Cultures: none Antibiotics: none  A:  No sign or symptoms of infection at this time P:   Monitor for signs and symptoms of infection  ENDOCRINE  Lab 02/29/12 0007  GLUCAP 127*   A:  No current problems   P:   Monitor glucose regularly  NEUROLOGIC  A:  History of dementia, depression and  essential tremor P:   Agree with Internal Medicine that altered mental status is likely a combination of narcotics and hospital delerium.  Would hold all sedating meds as possible.  Avoid benzodiazepines as these increase the risk of delerium in the elderly.    BEST PRACTICE / DISPOSITION Level of Care:  ICU Primary Service:  Surger Consultants:  PCCM Code Status:  full Diet:  Per  surgery DVT Px:  SCDs GI Px:  protonix Skin Integrity:  good Social / Family:  Family at bedside.   Carolan Clines., M.D. Pulmonary and Critical Care Medicine [facility] Pager: [phone]   I spent 35 minutes of critical care time in the care of this patient.   02/29/2012, 1:07 AM

## 2012-02-29 NOTE — Progress Notes (Signed)
Name: [name] MRN: [id number] DOB: 1940-11-18    LOS: 3  Referring Provider:  Dr. Johna Sheriff Reason for Referral:  Altered mental status and increased work of breathing  PULMONARY / CRITICAL CARE MEDICINE  HPI:  Ms. Olthoff is a 71 y/o woman with past medical history significant for diverticulitis, essential tremor, dementia and depression who underwent laproscopic colectomy for diverticulitis.  She had been more somnolent throughout the day and her pain medications (dilaudid PCA) were held.   Of note she is on seroquel and this was held for change in mental status.    Brief patient description:  71 y/o s/p laproscopic colectomy now with altered mental status  Events Since Admission: Rapid response to ICU 11/11  Current Status: Much more alert, able to protect airway.  Vital Signs: Temp:  [98.1 F (36.7 C)-99.6 F (37.6 C)] 98.2 F (36.8 C) (11/23 0400) Pulse Rate:  [69-111] 70  (11/23 0700) Resp:  [18-29] 26  (11/23 0700) BP: (112-172)/(67-86) 146/67 mmHg (11/23 0700) SpO2:  [93 %-100 %] 99 % (11/23 0700) Weight:  [63.549 kg (140 lb 1.6 oz)] 63.549 kg (140 lb 1.6 oz) (11/23 0000)  Physical Examination: General:  Sitting in bed in no acute distress Neuro:  Slow to answer questions but answers appropriately HEENT:  PERRL, EOMI, OP clear Neck:  Supple, no masses Cardiovascular:  NRRR, no mrg Lungs:  No wheezes, clearer, able to protect airway Abdomen:  +BS, slightly tender to palpation, non-distended, JP drain in place Musculoskeletal:  No joint abnormalities, no significant LE edema Skin:  No rash  Active Problems:  HYPERLIPIDEMIA-MIXED  CAD  ALLERGIC RHINITIS  GERD  Irritable bowel syndrome  TREMOR, ESSENTIAL  Diverticulitis  Anxiety  Acute delirium  Hypertension   ASSESSMENT AND PLAN  PULMONARY  Lab 02/28/12 2255  PHART 7.404  PCO2ART 42.5  PO2ART 82.9  HCO3 26.0*  O2SAT 96.9   [place] oxygen 2L: sats 100   CXR:  No obvious infiltrate, no evidence of  pulmonary edema ETT:  none  A:  Increased work of breathing improved with holding narcotics and seroquel  P:   Cont neb med, oxygen, IS Mobilize PT /OT   CARDIOVASCULAR  Lab 02/29/12 0355  TROPONINI <0.30  LATICACIDVEN --  PROBNP --   ECG:  NSR Lines: PIV  A: Shortness of breath d/t atelectasis. No primary CV event. Trop Neg P:  monitor RENAL  Lab 02/29/12 0355 02/29/12 0036 02/27/12 0431 02/26/12 1555 02/24/12 1015  NA 132* 135 136 -- 137  K 3.9 4.1 -- -- --  CL 94* 98 101 -- 99  CO2 28 29 24  -- 27  BUN 7 7 9  -- 22  CREATININE 0.47* 0.56 0.73 0.72 1.04  CALCIUM 9.0 9.2 8.8 -- 9.9  MG -- -- -- -- --  PHOS -- -- -- -- --   Intake/Output      11/22 0701 - 11/23 0700 11/23 0701 - 11/24 0700   P.O. 240    I.V. (mL/kg) 4045 (63.7)    Total Intake(mL/kg) 4285 (67.4)    Urine (mL/kg/hr) 1225 (0.8)    Drains 50    Total Output 1275    Net +3010         Urine Occurrence 550 x 1 x   Stool Occurrence 1 x     Foley:  In place  A: SL pos I/O  P:   Monitor volume status.   GASTROINTESTINAL  Lab 02/24/12 1015  AST 21  ALT 19  ALKPHOS 89  BILITOT 0.4  PROT 7.0  ALBUMIN 3.9    A:  S/P colectomy P:   Management per surgery  HEMATOLOGIC  Lab 02/29/12 0355 02/29/12 0036 02/27/12 0431 02/24/12 1015  HGB 11.3* 11.8* 12.5 14.6  HCT 32.8* 34.6* 35.7* 42.6  PLT 180 190 206 223  INR -- -- -- --  APTT -- -- -- --   A:  No acute issues P:  Monitor h/h   INFECTIOUS  Lab 02/29/12 0355 02/29/12 0036 02/27/12 0431 02/24/12 1015  WBC 12.0* 14.2* 13.9* 6.3  PROCALCITON -- -- -- --   Cultures: none Antibiotics: none  A:  No sign or symptoms of infection at this time P:   Monitor for signs and symptoms of infection  ENDOCRINE  Lab 02/29/12 0007  GLUCAP 127*   A:  No current problems   P:   Monitor glucose regularly  NEUROLOGIC  A:  History of dementia, depression and essential tremor P:   Cont to hold sedatives and narcs. Hold  seroquel Avoid benzodiazapenes   BEST PRACTICE / DISPOSITION Level of Care: SDU Primary Service:  Surgery Consultants:  PCCM Code Status:  full Diet:  Per surgery DVT Px:  SCDs GI Px:  protonix Skin Integrity:  good Social / Family:  Family at bedside. Daughter updated   Shan Levans, M.D. Pulmonary and Critical Care Medicine [facility]  [phone]  Cell  [phone]  If no response or cell goes to voicemail, call beeper [phone]   I spent 20  minutes of critical care time in the care of this patient.   02/29/2012, 7:53 AM

## 2012-03-01 DIAGNOSIS — F411 Generalized anxiety disorder: Secondary | ICD-10-CM

## 2012-03-01 DIAGNOSIS — I2510 Atherosclerotic heart disease of native coronary artery without angina pectoris: Secondary | ICD-10-CM

## 2012-03-01 LAB — BASIC METABOLIC PANEL
BUN: 11 mg/dL (ref 6–23)
Calcium: 8.4 mg/dL (ref 8.4–10.5)
Chloride: 97 mEq/L (ref 96–112)
Creatinine, Ser: 0.55 mg/dL (ref 0.50–1.10)
GFR calc Af Amer: >90 mL/min (ref >90)
GFR calc non Af Amer: >90 mL/min (ref >90)

## 2012-03-01 LAB — CBC
HCT: 26.5 % — ABNORMAL LOW (ref 36.0–46.0)
MCHC: 34.3 g/dL (ref 30.0–36.0)
Platelets: 166 10*3/uL (ref 150–400)
RDW: 13.2 % (ref 11.5–15.5)

## 2012-03-01 MED ORDER — PROPRANOLOL HCL 10 MG PO TABS
10.0000 mg | ORAL_TABLET | Freq: Two times a day (BID) | ORAL | Status: DC
  Administered 2012-03-02: 10 mg via ORAL
  Filled 2012-03-01 (×3): qty 1

## 2012-03-01 MED ORDER — CHLORHEXIDINE GLUCONATE 0.12 % MT SOLN
15.0000 mL | Freq: Two times a day (BID) | OROMUCOSAL | Status: DC
  Administered 2012-03-01 – 2012-03-02 (×2): 15 mL via OROMUCOSAL
  Filled 2012-03-01 (×8): qty 15

## 2012-03-01 MED ORDER — KCL IN DEXTROSE-NACL 20-5-0.45 MEQ/L-%-% IV SOLN
INTRAVENOUS | Status: AC
  Administered 2012-03-01 – 2012-03-02 (×2): via INTRAVENOUS
  Filled 2012-03-01 (×2): qty 1000

## 2012-03-01 MED ORDER — ACETAMINOPHEN 325 MG PO TABS
ORAL_TABLET | ORAL | Status: AC
  Filled 2012-03-01: qty 2

## 2012-03-01 MED ORDER — BIOTENE DRY MOUTH MT LIQD: 15.0000 mL | Freq: Two times a day (BID) | OROMUCOSAL | Status: DC

## 2012-03-01 MED ORDER — CLONIDINE HCL 0.1 MG PO TABS
0.1000 mg | ORAL_TABLET | Freq: Two times a day (BID) | ORAL | Status: DC
  Administered 2012-03-02 – 2012-03-03 (×4): 0.1 mg via ORAL
  Filled 2012-03-01 (×7): qty 1

## 2012-03-01 MED ORDER — ACETAMINOPHEN 325 MG PO TABS
650.0000 mg | ORAL_TABLET | ORAL | Status: DC | PRN
  Administered 2012-03-01: 650 mg via ORAL

## 2012-03-01 NOTE — Evaluation (Signed)
Physical Therapy Evaluation Patient Details Name: [name] MRN: [id number] DOB: Apr 30, 1940 Today's Date: 03/01/2012 Time: 4098-1191 PT Time Calculation (min): 30 min  PT Assessment / Plan / Recommendation Clinical Impression  Pt presents with laparoscopic sigmoid colectomy and acute delirium.  She has history of dementia, however no family present at time of eval to determine baseline or prior level of functioning.  Tolerated sitting EOB x approx 12 mins at varying levels of assist (total to min assist) and stood with +2 assist to take several side steps.  Pt will benefit from skilled PT in acute venue to address deficits.  PT recommends SNF for follow up at D/C to maximize pts safety and decrease burden of care.     PT Assessment  Patient needs continued PT services    Follow Up Recommendations  SNF;Supervision/Assistance - 24 hour    Does the patient have the potential to tolerate intense rehabilitation      Barriers to Discharge   unsure of amount of caregiver support.     Equipment Recommendations  Other (comment) (TBD)    Recommendations for Other Services     Frequency Min 3X/week    Precautions / Restrictions Precautions Precautions: Fall Precaution Comments: Pt w/ multiple lines including JP drain on R lower abdomen.  Restrictions Weight Bearing Restrictions: No   Pertinent Vitals/Pain Pt able to state pain in abdomen, however could not state number.       Mobility  Bed Mobility Bed Mobility: Supine to Sit;Sitting - Scoot to Edge of Bed Supine to Sit: 1: +2 Total assist;HOB elevated Supine to Sit: Patient Percentage: 40% Sitting - Scoot to Edge of Bed: 1: +2 Total assist Sitting - Scoot to Edge of Bed: Patient Percentage: 30% Sit to Supine: 1: +2 Total assist;HOB flat Sit to Supine: Patient Percentage: 30% Details for Bed Mobility Assistance: Assist for BLE out of bed (pt was able to assist LEs to EOB) and for trunk to attain sitting position with cues for  using LEs and UEs to self assist to EOB.   Transfers Transfers: Sit to Stand;Stand to Sit Sit to Stand: 1: +2 Total assist;With upper extremity assist;From bed Sit to Stand: Patient Percentage: 30% Stand to Sit: 1: +2 Total assist;With upper extremity assist;To bed Stand to Sit: Patient Percentage: 30% Details for Transfer Assistance: Assist to rise and maintain upright stance with PT/OT blocking B LEs to prevent buckling with cues for hand placement and technique.  Ambulation/Gait Ambulation/Gait Assistance: 1: +2 Total assist Ambulation/Gait: Patient Percentage: 30% Ambulation Distance (Feet): 3 Feet Assistive device: 2 person hand held assist Ambulation/Gait Assistance Details: Pt able to take some side steps towards [facility] to allow better positioning in bed.  Requires assist to maintain upright stance with PT/OT blocking B LEs to prevent knee buckling.  Gait Pattern: Step-to pattern;Trunk flexed Stairs: No Wheelchair Mobility Wheelchair Mobility: No    Shoulder Instructions     Exercises     PT Diagnosis: Difficulty walking;Generalized weakness;Acute pain  PT Problem List: Decreased strength;Decreased range of motion;Decreased activity tolerance;Decreased balance;Decreased mobility;Decreased coordination;Decreased cognition;Decreased knowledge of use of DME;Decreased knowledge of precautions;Decreased safety awareness;Pain PT Treatment Interventions: DME instruction;Gait training;Functional mobility training;Therapeutic activities;Therapeutic exercise;Balance training;Patient/family education   PT Goals Acute Rehab PT Goals PT Goal Formulation: Patient unable to participate in goal setting Time For Goal Achievement: 03/15/12 Potential to Achieve Goals: Fair Pt will go Supine/Side to Sit: with mod assist PT Goal: Supine/Side to Sit - Progress: Goal set today Pt will go Sit  to Supine/Side: with mod assist PT Goal: Sit to Supine/Side - Progress: Goal set today Pt will go Sit to  Stand: with max assist PT Goal: Sit to Stand - Progress: Goal set today Pt will go Stand to Sit: with mod assist PT Goal: Stand to Sit - Progress: Goal set today Pt will Transfer Bed to Chair/Chair to Bed: with max assist PT Transfer Goal: Bed to Chair/Chair to Bed - Progress: Goal set today Pt will Ambulate: 1 - 15 feet;with +2 total assist;with least restrictive assistive device;Other (comment) (pt assist 50%) PT Goal: Ambulate - Progress: Goal set today  Visit Information  Last PT Received On: 03/01/12 Assistance Needed: +2 PT/OT Co-Evaluation/Treatment: Yes    Subjective Data  Subjective: Pt mostly non-verbal during session, but would respond to some questions.  Patient Stated Goal: n/a   Prior Functioning  Home Living Additional Comments: No family available to provide prior functional status or home setting. Communication Communication: Other (comment) (answers questions with brief responses) Dominant Hand: Right    Cognition  Overall Cognitive Status: Impaired Area of Impairment: Following commands;Memory Arousal/Alertness: Awake/alert Orientation Level: Disoriented to;Time Behavior During Session: Flat affect Memory Deficits: pt has baseline dementia Following Commands: Follows one step commands consistently;Follows one step commands with increased time    Extremity/Trunk Assessment Right Upper Extremity Assessment RUE ROM/Strength/Tone: Deficits RUE ROM/Strength/Tone Deficits: grossly 3-/5 in shoulder, 3+/5 elbow to hand,  with tremor Left Upper Extremity Assessment LUE ROM/Strength/Tone: Deficits LUE ROM/Strength/Tone Deficits: grossly 3-/5 shoulder, 3/5 elbow to hand Right Lower Extremity Assessment RLE ROM/Strength/Tone: Deficits RLE ROM/Strength/Tone Deficits: Grossly 2+ to 3/5 per functional assessment.  She was able to assist with legs to EOB.  RLE Sensation: WFL - Light Touch Left Lower Extremity Assessment LLE ROM/Strength/Tone: Deficits LLE  ROM/Strength/Tone Deficits: Grossly 2+ to 3/5 per functional assessment.  She was able to assist BLEs to EOB.  LLE Sensation: WFL - Light Touch Trunk Assessment Trunk Assessment: Kyphotic   Balance Balance Balance Assessed: Yes Static Sitting Balance Static Sitting - Balance Support: Right upper extremity supported;Left upper extremity supported;Feet supported Static Sitting - Level of Assistance: 4: Min assist;1: +1 Total assist;Other (comment) (varied) Static Sitting - Comment/# of Minutes: 12  End of Session PT - End of Session Equipment Utilized During Treatment: Gait belt Activity Tolerance: Patient limited by pain;Patient limited by fatigue Patient left: in bed Nurse Communication: Mobility status  GP     Page, Meribeth Mattes 03/01/2012, 12:29 PM

## 2012-03-01 NOTE — Progress Notes (Signed)
[facility] PROGRESS NOTE  [name] ZOX:[id number] DOB: 10-29-1940 DOA: 02/26/2012 PCP: Marga Melnick, MD  Assessment/Plan: Acute delirium on top of dementia?  Likely multifactorial, patient at times provided details of her own medical care. Likely due to recent surgery, possible residual anesthesia and pain medications. -continue been judicious about narcotics and avoid benzo's -Consider early discontinuation of alvimopan post operatively, which may cause confusion; especially if mentation failed to improved further. -continue holding seroquel and clonazepam for now -Patient is overall better and more alert; today even oriented X2 (see physical exam below for details) -If mental status deteriorates will get CT head; meanwhile continue avoiding meds that can affect her sensorium and follow clinical response.   Hypertension  Soft but stable. Continue clonidine and propranolol (dose adjusted for both medications)  Hyperlipidemia  Statin held. Resume at discharge.   Blurry vision  Continue artificial tears.  No complaints of blurred vision today. To follow with ophthalmology as an outpatient  Essential tremors  Continue propanolol, but dose adjusted due to mild bradycardia and soft BP.   Anxiety/depression/fibromyalgia  Defer medications to her psychiatrist/primary care physician.  As mentation improves consider restarting patient on Seroquel.   GERD  Continue PPI.   Laparoscopic take down of splenic flexure and low anterior resection for diverticulitis  Management per surgery. Patient on alvimopan as per general surgery (known to cause confusion; consider stopping it if mentation fail to improved).   Coronary artery disease  -Aspirin held by surgery. Resume when possible. -No CP, EKG w/o acute ischemic changes -Might need transfusion if Hgb less than 8.0  Work of breathing -Able to protect airways -Less tachypnea and with good O2 sat on RA. -Continue pulm  toiletry -Continue flutter valve -further rec's per Pulmonary service.  Anemia: -most likely 2/2 to recent surgery -No transfusion needed at this moment; but consider it if Hgb < 8 (make sure to provide lasix after transfusion)  Decrease urine output and poor PO intake: -will increase IVF's for 15 hours and change fluid type to D5 1/2 NS.   DVT: heparin  Code Status: Full Family Communication: daughter at bedside Disposition Plan: Will follow patient along for acute encephalopathy.   Consultants: Attending is CCS  CCM  TR[facility]  Procedures:  Laparoscopic partial colectomy (Left)  Antibiotics:  ertapenem (given for prophylaxis)  HPI/Subjective: Afebrile, no CP, no nausea or vomiting. Patient is AAOX3; no focal neurologic deficit appreciated. Discussing with daughter at bedside patient is slow to answer questions at baseline and endorses she is close regarding to mentation to her usual state. Positive BM yesterday night.  Objective: Filed Vitals:   03/01/12 0800 03/01/12 0900 03/01/12 1000 03/01/12 1135  BP:  107/47 99/40   Pulse:  64 57   Temp: 98.9 F (37.2 C)     TempSrc: Oral     Resp:  19 21   Height:      Weight:      SpO2:  93% 95% 96%    Intake/Output Summary (Last 24 hours) at 03/01/12 1223 Last data filed at 03/01/12 1130  Gross per 24 hour  Intake   1585 ml  Output    975 ml  Net    610 ml   Filed Weights   02/26/12 1500 02/29/12 0000 03/01/12 0000  Weight: 61.19 kg (134 lb 14.4 oz) 63.549 kg (140 lb 1.6 oz) 64.9 kg (143 lb 1.3 oz)    Exam:   General:  NAD, afebrile; AAOX3 intermittently (person, place, situation: knew she  was here for abd surgery; also able to accurately recognize family members). Able to follow commands.  Cardiovascular: S1 and S2, no rubs or gallops  Respiratory: tachypnea, shallow breathing pattern; no wheezing and no rhonchi on exam.  Abdomen: soft, with clean dressings; decrease BM but present; no tenderness or  distension appreciated.  Neuro: CN intact, follow commands, no focal deficit. Positive essential tremor  Data Reviewed: Basic Metabolic Panel:  Lab 03/01/12 1610 02/29/12 0355 02/29/12 0036 02/27/12 0431 02/26/12 1555 02/24/12 1015  NA 132* 132* 135 136 -- 137  K 3.4* 3.9 4.1 4.3 -- 3.7  CL 97 94* 98 101 -- 99  CO2 27 28 29 24  -- 27  GLUCOSE 109* 121* 121* 101* -- 103*  BUN 11 7 7 9  -- 22  CREATININE 0.55 0.47* 0.56 0.73 0.72 --  CALCIUM 8.4 9.0 9.2 8.8 -- 9.9  MG -- -- -- -- -- --  PHOS -- -- -- -- -- --   Liver Function Tests:  Lab 02/24/12 1015  AST 21  ALT 19  ALKPHOS 89  BILITOT 0.4  PROT 7.0  ALBUMIN 3.9   CBC:  Lab 03/01/12 0348 02/29/12 0355 02/29/12 0036 02/27/12 0431 02/24/12 1015  WBC 6.4 12.0* 14.2* 13.9* 6.3  NEUTROABS -- -- -- -- 3.3  HGB 9.1* 11.3* 11.8* 12.5 14.6  HCT 26.5* 32.8* 34.6* 35.7* 42.6  MCV 94.0 94.8 95.8 94.2 94.5  PLT 166 180 190 206 223   Cardiac Enzymes:  Lab 02/29/12 0355  CKTOTAL --  CKMB --  CKMBINDEX --  TROPONINI <0.30   CBG:  Lab 02/29/12 0007  GLUCAP 127*    Recent Results (from the past 240 hour(s))  SURGICAL PCR SCREEN     Status: Normal   Collection Time   02/24/12  9:45 AM      Component Value Range Status Comment   MRSA, PCR NEGATIVE  NEGATIVE Final    Staphylococcus aureus NEGATIVE  NEGATIVE Final   MRSA PCR SCREENING     Status: Normal   Collection Time   02/28/12 11:57 PM      Component Value Range Status Comment   MRSA by PCR NEGATIVE  NEGATIVE Final      Studies: Dg Chest Port 1 View  02/28/2012  *RADIOLOGY REPORT*  Clinical Data: Respiratory distress, cough and congestion.  PORTABLE CHEST - 1 VIEW  Comparison: 02/24/2012  Findings: Shallow inspiration.  Normal heart size and pulmonary vascularity.  No focal airspace consolidation in the lungs.  No blunting of costophrenic angles.  No pneumothorax.  Mediastinal contours appear intact.  Calcification of the aorta.  Degenerative changes in the spine  and shoulders.  No significant change since previous study.  IMPRESSION: No evidence of active pulmonary disease.   Original Report Authenticated By: Burman Nieves, M.D.     Scheduled Meds:    . albuterol  2.5 mg Nebulization Q4H  . alvimopan  12 mg Oral BID  . antiseptic oral rinse  15 mL Mouth Rinse q12n4p  . chlorhexidine  15 mL Mouth Rinse BID  . cloNIDine  0.1 mg Oral BID  . donepezil  10 mg Oral q morning - 10a  . heparin  5,000 Units Subcutaneous Q8H  . pantoprazole  40 mg Oral Daily  . propranolol  10 mg Oral BID  . [DISCONTINUED] cloNIDine  0.1 mg Oral Daily  . [DISCONTINUED] cloNIDine  0.2 mg Oral QHS  . [DISCONTINUED] propranolol  10 mg Oral TID   Continuous Infusions:    .  dextrose 5 % and 0.45 % NaCl with KCl 20 mEq/L 75 mL/hr at 03/01/12 1130  . [DISCONTINUED] 0.9 % NaCl with KCl 20 mEq / L 50 mL/hr at 03/01/12 0900     Time spent: >30 minutes    Katlynne Mckercher  [facility] Pager [phone]. If 8PM-8AM, please contact night-coverage at [email or web address], password [facility] 03/01/2012, 12:23 PM  LOS: 4 days

## 2012-03-01 NOTE — Evaluation (Signed)
Occupational Therapy Evaluation Patient Details Name: [name] MRN: [id number] DOB: [birth year] Today's Date: 03/01/2012 Time: 4098-1191 OT Time Calculation (min): 28 min  OT Assessment / Plan / Recommendation Clinical Impression    [name] is a 71 y/o woman with past medical history significant for diverticulitis, essential tremor, dementia and depression who underwent laproscopic colectomy for diverticulitis.  Hospital course was complicated by delirium and respiratory issues.  No family available to advise on prior level of functioning or level of assist at home.  At this point, SNF is recommended for ST rehab.  Will follow to address deficit areas.   OT Assessment  Patient needs continued OT Services    Follow Up Recommendations  SNF    Barriers to Discharge      Equipment Recommendations  Other (comment) (TBA)    Recommendations for Other Services    Frequency  Min 2X/week    Precautions / Restrictions Precautions Precautions: Fall Precaution Comments: pt has multiple lines including JP drain Restrictions Weight Bearing Restrictions: No   Pertinent Vitals/Pain Reports pain in her abdomen with sitting, unable to rate, repositioned    ADL  Eating/Feeding: Simulated;Maximal assistance Where Assessed - Eating/Feeding: Bed level Grooming: Performed;Brushing hair;Maximal assistance Where Assessed - Grooming: Unsupported sitting Upper Body Bathing: Simulated;+1 Total assistance Where Assessed - Upper Body Bathing: Supported sitting Lower Body Bathing: Simulated;+1 Total assistance Where Assessed - Lower Body Bathing: Unsupported sitting Upper Body Dressing: Simulated;+1 Total assistance Where Assessed - Upper Body Dressing: Supported sitting Lower Body Dressing: Performed;+1 Total assistance Where Assessed - Lower Body Dressing: Supported sitting Transfers/Ambulation Related to ADLs: Pt stood from the bed and took several shuffling steps to [facility] with +2 assist pt 30  %.    OT Diagnosis: Acute pain;Generalized weakness;Cognitive deficits;Altered mental status  OT Problem List: Decreased activity tolerance;Decreased strength;Impaired balance (sitting and/or standing);Decreased cognition;Decreased coordination;Decreased knowledge of use of DME or AE;Impaired UE functional use;Pain OT Treatment Interventions: Self-care/ADL training;DME and/or AE instruction;Patient/family education;Cognitive remediation/compensation;Balance training   OT Goals Acute Rehab OT Goals OT Goal Formulation: Patient unable to participate in goal setting Time For Goal Achievement: 03/15/12 Potential to Achieve Goals: Good ADL Goals Pt Will Perform Eating: Sitting, chair;with min assist ADL Goal: Eating - Progress: Goal set today Pt Will Perform Grooming: with min assist;Sitting, chair ADL Goal: Grooming - Progress: Goal set today Pt Will Transfer to Toilet: with mod assist;with DME;3-in-1 ADL Goal: Toilet Transfer - Progress: Goal set today Miscellaneous OT Goals Miscellaneous OT Goal #1: Pt will perform supine to sit with mod assist in prep for ADL at EOB. OT Goal: Miscellaneous Goal #1 - Progress: Goal set today Miscellaneous OT Goal #2: Pt will sit unsupported x 5 min with supervision in prep for ADL. OT Goal: Miscellaneous Goal #2 - Progress: Goal set today Miscellaneous OT Goal #3: Pt will state items needed for ADL and request assistance as appropriate with min verbal cues. OT Goal: Miscellaneous Goal #3 - Progress: Goal set today  Visit Information  Last OT Received On: 03/01/12 Assistance Needed: +2 PT/OT Co-Evaluation/Treatment: Yes    Subjective Data  Subjective: "I had surgery on my colon." Patient Stated Goal: Pt did not state.   Prior Functioning     Home Living Additional Comments: No family available to provide prior functional status or home setting. Communication Communication: Other (comment) (answers questions with brief responses) Dominant Hand:  Right         Vision/Perception     Cognition  Overall Cognitive Status: Impaired  Area of Impairment: Following commands;Memory Arousal/Alertness: Awake/alert Orientation Level: Disoriented to;Time Behavior During Session: Flat affect Memory Deficits: pt has baseline dementia Following Commands: Follows one step commands consistently;Follows one step commands with increased time    Extremity/Trunk Assessment Right Upper Extremity Assessment RUE ROM/Strength/Tone: Deficits RUE ROM/Strength/Tone Deficits: grossly 3-/5 in shoulder, 3+/5 elbow to hand,  with tremor Left Upper Extremity Assessment LUE ROM/Strength/Tone: Deficits LUE ROM/Strength/Tone Deficits: grossly 3-/5 shoulder, 3/5 elbow to hand     Mobility Bed Mobility Bed Mobility: Supine to Sit;Sit to Supine;Sitting - Scoot to Edge of Bed Supine to Sit: 1: +2 Total assist;HOB elevated Supine to Sit: Patient Percentage: 40% Sitting - Scoot to Edge of Bed: 1: +2 Total assist Sitting - Scoot to Edge of Bed: Patient Percentage: 30% Sit to Supine: 1: +2 Total assist;HOB flat Sit to Supine: Patient Percentage: 30% Details for Bed Mobility Assistance: verbal cues for sequence Transfers Transfers: Sit to Stand;Stand to Sit Sit to Stand: 1: +2 Total assist;From bed Sit to Stand: Patient Percentage: 30% Stand to Sit: 1: +2 Total assist;To bed Stand to Sit: Patient Percentage: 30% Details for Transfer Assistance: verbal and physical cues for sequence/hand placement     Shoulder Instructions     Exercise     Balance Balance Balance Assessed: Yes Static Sitting Balance Static Sitting - Balance Support: Right upper extremity supported;Left upper extremity supported;Feet supported Static Sitting - Level of Assistance: 4: Min assist;1: +1 Total assist;Other (comment) (varied) Static Sitting - Comment/# of Minutes: 12   End of Session OT - End of Session Activity Tolerance: Patient limited by fatigue Patient left: in  bed;with call bell/phone within reach Nurse Communication: Mobility status  GO     [name] 03/01/2012, 12:05 PM [phone]

## 2012-03-01 NOTE — Progress Notes (Signed)
Patient transferring to room 1538.  Report called to [place], Charity fundraiser.  Patient to travel by bed.  Will continue to monitor.

## 2012-03-01 NOTE — Progress Notes (Signed)
Patient complaining with abdominal pain.  Notified Dr Johna Sheriff. Order given for tylenol 650mg  po q4hrs prn pain.

## 2012-03-01 NOTE — Progress Notes (Signed)
Patient ID: [name], female   DOB: [birth year], 71 y.o.   MRN: [id number] 4 Days Post-Op  Subjective: No C/O, denies SOB, pain or nausea.  No flatus or BM yet  Objective: Vital signs in last 24 hours: Temp:  [97.1 F (36.2 C)-99.4 F (37.4 C)] 98 F (36.7 C) (11/24 0400) Pulse Rate:  [57-77] 57  (11/24 0400) Resp:  [17-30] 22  (11/24 0400) BP: (105-155)/(55-84) 105/55 mmHg (11/24 0400) SpO2:  [94 %-100 %] 94 % (11/24 0400) Weight:  [143 lb 1.3 oz (64.9 kg)] 143 lb 1.3 oz (64.9 kg) (11/24 0000) Last BM Date: 02/29/12  Intake/Output from previous day: 11/23 0701 - 11/24 0700 In: 1220 [P.O.:240; I.V.:980] Out: 990 [Urine:980; Drains:10] Intake/Output this shift:    General appearance: fatigued, no distress and much more responsive than yesterday Resp: clear to auscultation bilaterally GI: normal findings: soft, non-tender Incision/Wound: Clean, wicks in place  Lab Results:   [facility] 03/01/12 0348 02/29/12 0355  WBC 6.4 12.0*  HGB 9.1* 11.3*  HCT 26.5* 32.8*  PLT 166 180   BMET  Basename 03/01/12 0348 02/29/12 0355  NA 132* 132*  K 3.4* 3.9  CL 97 94*  CO2 27 28  GLUCOSE 109* 121*  BUN 11 7  CREATININE 0.55 0.47*  CALCIUM 8.4 9.0     Studies/Results: Dg Chest Port 1 View  02/28/2012  *RADIOLOGY REPORT*  Clinical Data: Respiratory distress, cough and congestion.  PORTABLE CHEST - 1 VIEW  Comparison: 02/24/2012  Findings: Shallow inspiration.  Normal heart size and pulmonary vascularity.  No focal airspace consolidation in the lungs.  No blunting of costophrenic angles.  No pneumothorax.  Mediastinal contours appear intact.  Calcification of the aorta.  Degenerative changes in the spine and shoulders.  No significant change since previous study.  IMPRESSION: No evidence of active pulmonary disease.   Original Report Authenticated By: Burman Nieves, M.D.     Anti-infectives: Anti-infectives     Start     Dose/Rate Route Frequency Ordered Stop   02/27/12  0900   ertapenem (INVANZ) 1 g in sodium chloride 0.9 % 50 mL IVPB        1 g 100 mL/hr over 30 Minutes Intravenous Every 24 hours 02/26/12 1458 02/27/12 0930   02/26/12 0713   ertapenem (INVANZ) 1 g in sodium chloride 0.9 % 50 mL IVPB        1 g 100 mL/hr over 30 Minutes Intravenous On call to O.R. 02/26/12 2130 02/26/12 0849          Assessment/Plan: s/p Procedure(s): LAPAROSCOPIC SIGMOID COLECTOMY No further respiratory difficulties, mental status improved Abdomen seems OK, no bowel function yet Will transfer to floor FL diet    LOS: 4 days    Jesselle Laflamme T 03/01/2012

## 2012-03-02 ENCOUNTER — Telehealth (INDEPENDENT_AMBULATORY_CARE_PROVIDER_SITE_OTHER): Payer: Self-pay | Admitting: General Surgery

## 2012-03-02 DIAGNOSIS — K5732 Diverticulitis of large intestine without perforation or abscess without bleeding: Principal | ICD-10-CM

## 2012-03-02 MED ORDER — SODIUM CHLORIDE 0.9 % IJ SOLN: 3.0000 mL | INTRAMUSCULAR | Status: DC | PRN

## 2012-03-02 MED ORDER — SODIUM CHLORIDE 0.9 % IJ SOLN
3.0000 mL | Freq: Two times a day (BID) | INTRAMUSCULAR | Status: DC
  Administered 2012-03-02 – 2012-03-03 (×4): 3 mL via INTRAVENOUS

## 2012-03-02 MED ORDER — ACETAMINOPHEN 325 MG PO TABS
650.0000 mg | ORAL_TABLET | Freq: Four times a day (QID) | ORAL | Status: DC
  Administered 2012-03-02 – 2012-03-03 (×8): 650 mg via ORAL
  Filled 2012-03-02 (×12): qty 2

## 2012-03-02 MED ORDER — TRAMADOL HCL 50 MG PO TABS
50.0000 mg | ORAL_TABLET | Freq: Four times a day (QID) | ORAL | Status: DC | PRN
  Administered 2012-03-02 – 2012-03-03 (×3): 50 mg via ORAL
  Administered 2012-03-04: 100 mg via ORAL
  Filled 2012-03-02: qty 2
  Filled 2012-03-02 (×3): qty 1

## 2012-03-02 MED ORDER — DIPHENHYDRAMINE HCL 50 MG/ML IJ SOLN: 12.5000 mg | Freq: Four times a day (QID) | INTRAMUSCULAR | Status: DC | PRN

## 2012-03-02 MED ORDER — PROPRANOLOL HCL 10 MG PO TABS
10.0000 mg | ORAL_TABLET | Freq: Three times a day (TID) | ORAL | Status: DC
  Administered 2012-03-02 – 2012-03-03 (×5): 10 mg via ORAL
  Filled 2012-03-02 (×8): qty 1

## 2012-03-02 MED ORDER — ALBUTEROL SULFATE (5 MG/ML) 0.5% IN NEBU: 2.5000 mg | INHALATION_SOLUTION | RESPIRATORY_TRACT | Status: DC | PRN

## 2012-03-02 MED ORDER — LORATADINE 10 MG PO TABS
10.0000 mg | ORAL_TABLET | Freq: Every morning | ORAL | Status: DC
  Administered 2012-03-02 – 2012-03-03 (×2): 10 mg via ORAL
  Filled 2012-03-02 (×3): qty 1

## 2012-03-02 MED ORDER — DICLOFENAC SODIUM 50 MG PO TBEC
50.0000 mg | DELAYED_RELEASE_TABLET | Freq: Every morning | ORAL | Status: DC
  Administered 2012-03-02 – 2012-03-03 (×2): 50 mg via ORAL
  Filled 2012-03-02 (×3): qty 1

## 2012-03-02 MED ORDER — LACTATED RINGERS IV BOLUS (SEPSIS): 1000.0000 mL | Freq: Three times a day (TID) | INTRAVENOUS | Status: AC | PRN

## 2012-03-02 NOTE — Telephone Encounter (Signed)
Dr Michaell Cowing needs paged about this.

## 2012-03-02 NOTE — Progress Notes (Signed)
[facility] PROGRESS NOTE  SHIFFY SMIGIELSKI AVW:[id number] DOB: [birth year] DOA: 02/26/2012 PCP: Marga Melnick, MD  Assessment/Plan: Acute delirium on top of dementia?  Likely multifactorial, patient at times provided details of her own medical care. Likely due to recent surgery, possible residual anesthesia and pain medications. -continue been judicious about narcotics and avoid benzo's.. -Consider early discontinuation of alvimopan post operatively, which may cause confusion; especially if mentation failed to improved further. -continue holding seroquel and clonazepam for now (can resume at discharge) -Patient is overall better and more alert; today even oriented X3 (see physical exam below for details) -MS fairly close to baseline; please avoid medications that can affect her sensorium. -Will sign off at this point. Follow recommendations and call with questions.  -continue aricept  Hypertension  Soft but stable. Continue clonidine 0.1mg  BID  Hyperlipidemia  Statin held. Resume at discharge.   Blurry vision  Continue artificial tears.  No complaints of blurred vision today. To follow with ophthalmology as an outpatient  Essential tremors  -Will resume home dose of propanolol   Anxiety/depression/fibromyalgia  Defer medications to her psychiatrist/primary care physician.  As mentation improves consider restarting patient on Seroquel.   GERD  Continue PPI.   Laparoscopic take down of splenic flexure and low anterior resection for diverticulitis  Management per surgery. Patient on alvimopan as per general surgery (known to cause confusion; consider stopping it if mentation fail to improved).   Coronary artery disease  -Aspirin held by surgery. Please resume when possible. -No CP, EKG w/o acute ischemic changes -Might need transfusion if Hgb less than 8.0  Work of breathing -Able to protect airways -Less tachypnea and with good O2 sat on RA. -Continue pulm  toiletry -Continue flutter valve -further rec's per Pulmonary service.  Anemia: -most likely 2/2 to recent surgery -No transfusion needed at this moment; but consider it if Hgb < 8 (make sure to provide lasix after transfusion)  Decrease urine output and poor PO intake: -improved with IVF's. -BP also better. -diet continue to be advance by surgery team. -Follow fluid status closely to avoid dehydration.   DVT: heparin  Code Status: Full Family Communication: no family at bedside Disposition Plan: Will sign off; follow recommendations and contact us with any questions. Thanks for this consult.   Consultants: Attending is CCS  CCM  TR[facility]  Procedures:  Laparoscopic partial colectomy (Left)  Antibiotics:  ertapenem (given for prophylaxis)  HPI/Subjective: Afebrile, no CP, no nausea or vomiting. Patient is AAOX3; no focal neurologic deficit appreciated.   Objective: Filed Vitals:   03/02/12 0201 03/02/12 0205 03/02/12 0540 03/02/12 1400  BP:  130/71 149/66 153/73  Pulse:  68 72 59  Temp:  98.8 F (37.1 C) 98.8 F (37.1 C) 98.8 F (37.1 C)  TempSrc:  Axillary Axillary Oral  Resp:  18 20 18   Height:      Weight:      SpO2: 95% 95% 92% 92%    Intake/Output Summary (Last 24 hours) at 03/02/12 1523 Last data filed at 03/02/12 1438  Gross per 24 hour  Intake    745 ml  Output   2520 ml  Net  -1775 ml   Filed Weights   02/26/12 1500 02/29/12 0000 03/01/12 0000  Weight: 61.19 kg (134 lb 14.4 oz) 63.549 kg (140 lb 1.6 oz) 64.9 kg (143 lb 1.3 oz)    Exam:   General:  NAD, afebrile; well oriented now (person, place, situation: knew she was here for abd surgery; also able  to accurately recognize family members). Able to follow commands properly.  Cardiovascular: S1 and S2, no rubs or gallops, regular rate  Respiratory: tachypnea, shallow breathing pattern; no wheezing and no rhonchi on exam.  Abdomen: soft, with clean dressings; positive BS; no tenderness or  distension appreciated.  Neuro: CN intact, follow commands, no focal deficit. Positive essential tremor  Data Reviewed: Basic Metabolic Panel:  Lab 03/01/12 0981 02/29/12 0355 02/29/12 0036 02/27/12 0431 02/26/12 1555  NA 132* 132* 135 136 --  K 3.4* 3.9 4.1 4.3 --  CL 97 94* 98 101 --  CO2 27 28 29 24  --  GLUCOSE 109* 121* 121* 101* --  BUN 11 7 7 9  --  CREATININE 0.55 0.47* 0.56 0.73 0.72  CALCIUM 8.4 9.0 9.2 8.8 --  MG -- -- -- -- --  PHOS -- -- -- -- --   CBC:  Lab 03/01/12 0348 02/29/12 0355 02/29/12 0036 02/27/12 0431  WBC 6.4 12.0* 14.2* 13.9*  NEUTROABS -- -- -- --  HGB 9.1* 11.3* 11.8* 12.5  HCT 26.5* 32.8* 34.6* 35.7*  MCV 94.0 94.8 95.8 94.2  PLT 166 180 190 206   Cardiac Enzymes:  Lab 02/29/12 0355  CKTOTAL --  CKMB --  CKMBINDEX --  TROPONINI <0.30   CBG:  Lab 02/29/12 0007  GLUCAP 127*    Recent Results (from the past 240 hour(s))  SURGICAL PCR SCREEN     Status: Normal   Collection Time   02/24/12  9:45 AM      Component Value Range Status Comment   MRSA, PCR NEGATIVE  NEGATIVE Final    Staphylococcus aureus NEGATIVE  NEGATIVE Final   MRSA PCR SCREENING     Status: Normal   Collection Time   02/28/12 11:57 PM      Component Value Range Status Comment   MRSA by PCR NEGATIVE  NEGATIVE Final      Studies: No results found.  Scheduled Meds:    . acetaminophen  650 mg Oral QID  . antiseptic oral rinse  15 mL Mouth Rinse q12n4p  . chlorhexidine  15 mL Mouth Rinse BID  . cloNIDine  0.1 mg Oral BID  . diclofenac  50 mg Oral q morning - 10a  . donepezil  10 mg Oral q morning - 10a  . heparin  5,000 Units Subcutaneous Q8H  . loratadine  10 mg Oral q morning - 10a  . pantoprazole  40 mg Oral Daily  . propranolol  10 mg Oral TID  . sodium chloride  3 mL Intravenous Q12H  . [DISCONTINUED] albuterol  2.5 mg Nebulization Q4H  . [DISCONTINUED] alvimopan  12 mg Oral BID  . [DISCONTINUED] propranolol  10 mg Oral BID   Continuous Infusions:     . [EXPIRED] dextrose 5 % and 0.45 % NaCl with KCl 20 mEq/L 75 mL/hr at 03/02/12 0006     Time spent: < 30 minutes    Michiko Lineman  [facility] Pager [phone]. If 8PM-8AM, please contact night-coverage at [email or web address], password [facility] 03/02/2012, 3:23 PM  LOS: 5 days

## 2012-03-02 NOTE — Telephone Encounter (Signed)
[name] (case manager) from [facility] [address] called stating they need to know when this patient will be ready for discharge. Should they work on home health for this patient? The patient is not interested in home health care, but her situation may require. Please advise. Her call back # [phone].

## 2012-03-02 NOTE — Progress Notes (Addendum)
Clinical Social Work Department BRIEF PSYCHOSOCIAL ASSESSMENT 03/02/2012  Patient:  PRIA, KLOSINSKI     Account Number:  [id number]     Admit date:  02/26/2012  Clinical Social Worker:Nicodemus Denk LCSW Date/Time:  03/02/2012 02:53 PM  Referred by:  Physician  Date Referred:  03/02/2012 Referred for  SNF Placement   Other Referral:   Interview type:  Family Other interview type:    PSYCHOSOCIAL DATA Living Status:  HUSBAND Admitted from facility:   Level of care:   Primary support name:  Nicholes Calamity Primary support relationship to patient:  SPOUSE Degree of support available:   supportive / works  part time    CURRENT CONCERNS Current Concerns  Post-Acute Placement   Other Concerns:    SOCIAL WORK ASSESSMENT / PLAN Pt is a 71 yr old female living at home prior to hospitalization. CSW met with pt / spouse to assist with d/c planning. PT has recommended SNF placement vs 24/7 supervision at home. D/C recommendations reviewed with pt/spouse. Pt/spouse requesting d/c home with [facility] services / Pvt. pay support. The need for 24/7 supervisoin stressed if SNF is declined. Spouse did give CSW permission to investigate SNF placement in case plan changes and SNF is needed.   Assessment/plan status:  Psychosocial Support/Ongoing Assessment of Needs Other assessment/ plan:   HH vs SNF   Information/referral to community resources:   SNF list    PATIENT'S/FAMILY'S RESPONSE TO PLAN OF CARE: Pt wants to return home . Spouse feels pt will do better at home than in a facility.   Cori Razor LCSW [phone]

## 2012-03-02 NOTE — Progress Notes (Addendum)
[name] [id number] [birth year]   Subjective:  Transferred to floor UOP improved Sore Denies N/V  Objective:  Vital signs:  Filed Vitals:   03/01/12 2130 03/02/12 0201 03/02/12 0205 03/02/12 0540  BP: 88/65  130/71 149/66  Pulse: 66  68 72  Temp: 99 F (37.2 C)  98.8 F (37.1 C) 98.8 F (37.1 C)  TempSrc: Oral  Axillary Axillary  Resp: 16  18 20   Height:      Weight:      SpO2: 94% 95% 95% 92%    Last BM Date: 03/02/12  Intake/Output   Yesterday:  11/24 0701 - 11/25 0700 In: 847.5 [P.O.:120; I.V.:727.5] Out: 2490 [Urine:2465; Drains:25] This shift:     Bowel function:  Flatus: ?  BM: Y  Drain: minimal serosanguinous/old blood  Physical Exam:  General: Pt awake/alert in no acute distress Eyes: PERRL, normal EOM.  Sclera clear.  No icterus Neuro: CN II-XII intact w/o focal sensory/motor deficits.  Left hand tremor Lymph: No head/neck/groin lymphadenopathy Psych:  No delerium/psychosis.  Looks anxious but with some mask face.  CAlm HENT: Normocephalic, Mucus membranes moist.  No thrush Neck: Supple, No tracheal deviation Chest: No chest wall pain w good excursion CV:  Pulses intact.  Regular rhythm MS: Normal AROM mjr joints.  No obvious deformity Abdomen: Soft.  Nondistended.  Mildly tender at incisions only.  Incisions clean - wicks removed.  No incarcerated hernias. Ext:  SCDs BLE.  No mjr edema.  No cyanosis Skin: No petechiae / purpurae  Problem List:  Principal Problem:  *Acute delirium Active Problems:  HYPERLIPIDEMIA-MIXED  CAD  ALLERGIC RHINITIS  GERD  Irritable bowel syndrome  TREMOR, ESSENTIAL  Diverticulitis  Anxiety  Hypertension   Assessment  [name]  71 y.o. female  5 Days Post-Op  Procedure(s): LAPAROSCOPIC SIGMOID COLECTOMY PARTIAL COLECTOMY  Ileus resolving  MS stabilizing though baseline fair  Plan: -follow MS closely IM help appreciated -adv diet -PT/OT -placement - SNF seems likely.  Perhaps ready  for d/c Wed 11/27 -follow off IVF - PRN boluses -D/C foley to minimize UTI risk -d/c pelvic drain -VTE prophylaxis- SCDs, etc -mobilize as tolerated to help recovery  Ardeth Sportsman, M.D., F.A.C.S. Gastrointestinal and Minimally Invasive Surgery [facility] [address] [place], [place] [address] [phone] Main / Paging [phone] Voice Mail   03/02/2012  CARE TEAM:  PCP: Marga Melnick, MD  Outpatient Care Team: Patient Care Team: Pecola Lawless, MD as PCP - General  Inpatient Treatment Team: Treatment Team: Attending Provider: Ernestene Mention, MD; Consulting Physician: Ardeth Sportsman, MD; Rounding Team: Lilyan Gilford, MD; Registered Nurse: Lorrin Goodell, RN; Respiratory Therapist: Renold Genta, RRT; Technician: Lynden Ang, NT; Registered Nurse: Patsey Berthold, RN; Registered Nurse: Dina Rich, RN   Results:   Labs: Results for orders placed during the hospital encounter of 02/26/12 (from the past 48 hour(s))  BASIC METABOLIC PANEL     Status: Abnormal   Collection Time   03/01/12  3:48 AM      Component Value Range Comment   Sodium 132 (*) 135 - 145 mEq/L    Potassium 3.4 (*) 3.5 - 5.1 mEq/L    Chloride 97  96 - 112 mEq/L    CO2 27  19 - 32 mEq/L    Glucose, Bld 109 (*) 70 - 99 mg/dL    BUN 11  6 - 23 mg/dL    Creatinine, Ser  0.55  0.50 - 1.10 mg/dL    Calcium 8.4  8.4 - 62.1 mg/dL    GFR calc non Af Amer >90  >90 mL/min    GFR calc Af Amer >90  >90 mL/min   CBC     Status: Abnormal   Collection Time   03/01/12  3:48 AM      Component Value Range Comment   WBC 6.4  4.0 - 10.5 K/uL    RBC 2.82 (*) 3.87 - 5.11 MIL/uL    Hemoglobin 9.1 (*) 12.0 - 15.0 g/dL    HCT 30.8 (*) 65.7 - 46.0 %    MCV 94.0  78.0 - 100.0 fL    MCH 32.3  26.0 - 34.0 pg    MCHC 34.3  30.0 - 36.0 g/dL    RDW 84.6  96.2 - 95.2 %    Platelets 166  150 - 400 K/uL     Imaging / Studies: No results  found.  Medications / Allergies: per chart  Antibiotics: Anti-infectives     Start     Dose/Rate Route Frequency Ordered Stop   02/27/12 0900   ertapenem (INVANZ) 1 g in sodium chloride 0.9 % 50 mL IVPB        1 g 100 mL/hr over 30 Minutes Intravenous Every 24 hours 02/26/12 1458 02/27/12 0930   02/26/12 0713   ertapenem (INVANZ) 1 g in sodium chloride 0.9 % 50 mL IVPB        1 g 100 mL/hr over 30 Minutes Intravenous On call to O.R. 02/26/12 8413 02/26/12 2440

## 2012-03-03 MED ORDER — ENSURE COMPLETE PO LIQD
237.0000 mL | Freq: Two times a day (BID) | ORAL | Status: DC
  Administered 2012-03-03: 237 mL via ORAL

## 2012-03-03 NOTE — Progress Notes (Signed)
Spoke to Dr. Michaell Cowing informed him patient now wanting to go to SNF states this is fine.

## 2012-03-03 NOTE — Progress Notes (Signed)
Spoke to Dr. Michaell Cowing aware that patient and family have changed their mind and now want to go home with [facility] and 24hr care, MD agrees with plan to d/c in am with services set up.

## 2012-03-03 NOTE — Progress Notes (Signed)
CSW received a call from pt's daughter stating that pt/family have changed their mind and now are declining SNF placement. Daughter states that [facility] is able to provide 24/7 in home support starting tomorrow. Pt is planning to d/c 11/27. MD is aware of change in plan. CSW has informed RNCM and SNF that pt will be d/c home.  Cori Razor LCSW [phone]

## 2012-03-03 NOTE — Progress Notes (Signed)
Spoke with patient informed her that MD states will discharge home in am with [facility] [place], states understanding

## 2012-03-03 NOTE — Progress Notes (Signed)
CSW received a call from assistant from Mr. Chiles office stating that Mr. Kumagai was interested in placing pt at [facility] for rehab. CSW informed rep that d/c plan has been changed from home to SNF to home. Pt plan's to d/c in the am and family has arranged ( according to pt's daughter ) 24/7 home care through [facility]. Services will begin tomorrow, again, according to daughter. BCBS prior authorization process was stopped once pt/family declined SNF. Assistant will inform Mr. Stammer. CSW did contacted [facility] and confirmed that Mr. Bobst did contact them to see if there was availability . SNF  SW was not aware that pt had [facility] when she indicated that there was an opening. [facility] [address] does not accept BCBS as primary insurance. CSW spoke with pt ( no family in room ) and was told that she did plan to d/c home Wed. CSW left message to have Mr. Trosper contact CSW for further assistance with d/c planning , if needed.  Cori Razor LCSW [phone]

## 2012-03-03 NOTE — Progress Notes (Signed)
Occupational Therapy Treatment Patient Details Name: [name] MRN: [id number] DOB: 12-Dec-1940 Today's Date: 03/03/2012 Time: 4098-1191 OT Time Calculation (min): 17 min  OT Assessment / Plan / Recommendation Comments on Treatment Session Pt mobilized much better this session than at eval. Pt very slow to process info and commands. Will need st snf at d/c.    Follow Up Recommendations  SNF    Barriers to Discharge       Equipment Recommendations  3 in 1 bedside comode    Recommendations for Other Services    Frequency Min 2X/week   Plan Discharge plan remains appropriate    Precautions / Restrictions Precautions Precautions: Fall   Pertinent Vitals/Pain Pt denied pain.    ADL  Eating/Feeding: Performed;Set up Where Assessed - Eating/Feeding: Chair Toilet Transfer: Performed;Minimal assistance Toilet Transfer Method: Stand pivot Toilet Transfer Equipment: Materials engineer and Hygiene: Performed;+1 Total assistance Where Assessed - Engineer, mining and Hygiene: Sit to stand from 3-in-1 or toilet Transfers/Ambulation Related to ADLs: Pt ambulated around room and in hallway with +2 HHA.  ADL Comments: Fatigues quickly.    OT Diagnosis:    OT Problem List:   OT Treatment Interventions:     OT Goals ADL Goals ADL Goal: Eating - Progress: Met Pt Will Transfer to Toilet: with supervision;Ambulation;Stand pivot transfer;with DME ADL Goal: Toilet Transfer - Progress: Updated due to goal met Miscellaneous OT Goals OT Goal: Miscellaneous Goal #1 - Progress: Met OT Goal: Miscellaneous Goal #2 - Progress: Met  Visit Information  Last OT Received On: 03/03/12 Assistance Needed: +2 PT/OT Co-Evaluation/Treatment: Yes    Subjective Data  Subjective: Is that my lunch? Patient Stated Goal: Pt unable to state goal 2* impaired cognition.   Prior Functioning       Cognition  Overall Cognitive Status: Impaired Area of  Impairment: Following commands;Memory Arousal/Alertness: Awake/alert Orientation Level: Disoriented to;Time Behavior During Session: Flat affect Memory Deficits: pt has baseline dementia Following Commands: Follows one step commands consistently;Follows one step commands with increased time    Mobility  Shoulder Instructions Bed Mobility Bed Mobility: Supine to Sit;Sitting - Scoot to Edge of Bed Supine to Sit: 4: Min assist;3: Mod assist;With rails;HOB elevated (increased time) Sitting - Scoot to Edge of Bed: 4: Min guard Details for Bed Mobility Assistance: assist to initiate and complete task; pt requires increased tiem Transfers Sit to Stand: 4: Min assist Stand to Sit: 4: Min assist;4: Min guard Details for Transfer Assistance: min to min/guard for safety; multimodal cues for safety, sequence; requires increased tiem to process       Exercises      Balance     End of Session OT - End of Session Activity Tolerance: Patient limited by fatigue Patient left: in chair;with call bell/phone within reach;with family/visitor present  GO     Dhaval Woo A OTR/L [phone] 03/03/2012, 2:32 PM

## 2012-03-03 NOTE — Progress Notes (Signed)
CSW met with pt's daughter and CM to assist with d/c planning. Pt is now willing to accept ST SNF placement. SNF bed offers provided and [facility] was chosen by daughter. SNF contacted and will begin BCBS prior approval process. Pt/Family/SNF are aware pt is ready for d/c. CSW will follow to assist with d/c planning.  Cori Razor LCSW [phone]

## 2012-03-03 NOTE — Progress Notes (Signed)
[name] [id number] [birth year]   Subjective:  Sore but medicines help Denies N/V Tolerating PO Moving w assist in room Family wanting her to go home w HH.  PT/OT  More inclined towards SNF Pt wishes to go home but open to SNF in the short term  Objective:  Vital signs:  Filed Vitals:   03/02/12 0540 03/02/12 1400 03/02/12 2219 03/03/12 0545  BP: 149/66 153/73 155/70 152/68  Pulse: 72 59 60 62  Temp: 98.8 F (37.1 C) 98.8 F (37.1 C) 98.6 F (37 C) 98.3 F (36.8 C)  TempSrc: Axillary Oral Oral Oral  Resp: 20 18 18 18   Height:      Weight:      SpO2: 92% 92% 94% 92%    Last BM Date: 03/02/12  Intake/Output   Yesterday:  11/25 0701 - 11/26 0700 In: 1200 [P.O.:1200] Out: 1870 [Urine:1860; Drains:10] This shift:     Bowel function:  Flatus: Y  BM: Y  Drain: minimal serosanguinous/old blood  Physical Exam:  General: Pt awake/alert in no acute distress Eyes: PERRL, normal EOM.  Sclera clear.  No icterus Neuro: CN II-XII intact w/o focal sensory/motor deficits.  Left hand tremor mild & unchanged Lymph: No head/neck/groin lymphadenopathy Psych:  No delerium/psychosis.  Less anxious with some mask face.  Calm HENT: Normocephalic, Mucus membranes moist.  No thrush Neck: Supple, No tracheal deviation Chest: No chest wall pain w good excursion CV:  Pulses intact.  Regular rhythm MS: Normal AROM mjr joints.  No obvious deformity Abdomen: Soft.  Nondistended.  Minimally tender at incisions only.  Incisions clean - wicks removed.  No incarcerated hernias. Ext:  SCDs BLE.  No mjr edema.  No cyanosis Skin: No petechiae / purpurae  Problem List:  Principal Problem:  *Diverticulitis Active Problems:  HYPERLIPIDEMIA-MIXED  Anxiety state, unspecified  CAD  ALLERGIC RHINITIS  GERD  DIVERTICULOSIS  Irritable bowel syndrome  TREMOR, ESSENTIAL  Anxiety  Acute delirium  Hypertension   Assessment  [name]  71 y.o. female  6 Days Post-Op   Procedure(s): LAPAROSCOPIC SIGMOID COLECTOMY PARTIAL COLECTOMY  MS stabilizing though baseline fair  Plan: -follow MS closely -IM help signed off -diet -PT/OT  -Placement - SNF seems likely.  Playing phone-tag w SW.  Close to d/c - later today OK.  I am open to home with aggressive HH if continues to improve & family can provide 24/7 care over Thanksgiving at the least, especially for mobility.  O/w short term SNF.  Pt will need PT/OT help to continue to recover.  Will need close f/u w Dr. Derrell Lolling at [facility]  -VTE prophylaxis- SCDs, etc -mobilize as tolerated to help recovery  Ardeth Sportsman, M.D., F.A.C.S. Gastrointestinal and Minimally Invasive Surgery [facility] [address], [address] [place], [place] [address] [phone] Main / Paging [phone] Voice Mail   03/03/2012  CARE TEAM:  PCP: Marga Melnick, MD  Outpatient Care Team: Patient Care Team: Pecola Lawless, MD as PCP - General  Inpatient Treatment Team: Treatment Team: Attending Provider: Ernestene Mention, MD; Consulting Physician: Ardeth Sportsman, MD; Registered Nurse: Lorrin Goodell, RN; Respiratory Therapist: Renold Genta, RRT; Technician: Lynden Ang, NT; Registered Nurse: Patsey Berthold, RN; Registered Nurse: Horton Marshall   Results:   Labs: No results found for this or any previous visit (from the past 48 hour(s)).  Imaging / Studies: No results found.  Medications / Allergies: per chart  Antibiotics: Anti-infectives  Start     Dose/Rate Route Frequency Ordered Stop   02/27/12 0900   ertapenem (INVANZ) 1 g in sodium chloride 0.9 % 50 mL IVPB        1 g 100 mL/hr over 30 Minutes Intravenous Every 24 hours 02/26/12 1458 02/27/12 0930   02/26/12 0713   ertapenem (INVANZ) 1 g in sodium chloride 0.9 % 50 mL IVPB        1 g 100 mL/hr over 30 Minutes Intravenous On call to O.R. 02/26/12 4782 02/26/12 9562

## 2012-03-03 NOTE — Progress Notes (Signed)
Physical Therapy Treatment Patient Details Name: [name] MRN: [id number] DOB: 16-Mar-1941 Today's Date: 03/03/2012 Time: 1222-1239 PT Time Calculation (min): 17 min  PT Assessment / Plan / Recommendation Comments on Treatment Session  pt doing much better this session; still feel pt will benefit form SNF    Follow Up Recommendations  SNF;Supervision/Assistance - 24 hour     Does the patient have the potential to tolerate intense rehabilitation     Barriers to Discharge        Equipment Recommendations       Recommendations for Other Services    Frequency Min 3X/week   Plan Discharge plan remains appropriate;Frequency remains appropriate    Precautions / Restrictions Precautions Precautions: Fall   Pertinent Vitals/Pain     Mobility  Bed Mobility Bed Mobility: Supine to Sit;Sitting - Scoot to Edge of Bed Supine to Sit: 4: Min assist;3: Mod assist;With rails;HOB elevated (increased time) Sitting - Scoot to Edge of Bed: 4: Min guard Details for Bed Mobility Assistance: assist to initiate and complete task; pt requires increased tiem Transfers Transfers: Sit to Stand;Stand to [employer] Transfers Sit to Stand: 4: Min assist Stand to Sit: 4: Min assist;4: Min Producer, television/film/video Transfers: 4: Min assist Details for Transfer Assistance: min to min/guard for safety; multimodal cues for safety, sequence; requires increased tiem to process Ambulation/Gait Ambulation/Gait Assistance: 1: +2 Total assist Ambulation/Gait: Patient Percentage: 90% Ambulation Distance (Feet): 38 Feet Assistive device: 2 person hand held assist Ambulation/Gait Assistance Details: pt requires increased time, cues for safety and sequenc Gait Pattern: Narrow base of support;Trunk flexed    Exercises     PT Diagnosis:    PT Problem List:   PT Treatment Interventions:     PT Goals Acute Rehab PT Goals Time For Goal Achievement: 03/15/12 Potential to Achieve Goals: Fair Pt will go  Supine/Side to Sit: with mod assist PT Goal: Supine/Side to Sit - Progress: Progressing toward goal Pt will go Sit to Stand: with supervision PT Goal: Sit to Stand - Progress: Updated due to goal met Pt will go Stand to Sit: with supervision PT Goal: Stand to Sit - Progress: Updated due to goals met Pt will Transfer Bed to Chair/Chair to Bed: with supervision PT Transfer Goal: Bed to Chair/Chair to Bed - Progress: Updated due to goal met Pt will Ambulate: 51 - 150 feet;with least restrictive assistive device;with min assist PT Goal: Ambulate - Progress: Updated due to goal met  Visit Information  Last PT Received On: 03/03/12 Assistance Needed: +2 PT/OT Co-Evaluation/Treatment: Yes    Subjective Data  Subjective: pt verbalizes very little   Cognition  Overall Cognitive Status: Impaired Area of Impairment: Following commands;Memory Arousal/Alertness: Awake/alert Orientation Level: Disoriented to;Time Behavior During Session: Flat affect Memory Deficits: pt has baseline dementia Following Commands: Follows one step commands consistently;Follows one step commands with increased time    Balance     End of Session PT - End of Session Equipment Utilized During Treatment: Gait belt Activity Tolerance: Patient limited by pain;Patient limited by fatigue Patient left: in chair;with call bell/phone within reach;with family/visitor present Nurse Communication: Mobility status   GP     [facility] 03/03/2012, 1:16 PM

## 2012-03-04 MED ORDER — TRAMADOL HCL 50 MG PO TABS: 50.0000 mg | ORAL_TABLET | Freq: Four times a day (QID) | ORAL | Status: DC | PRN

## 2012-03-04 NOTE — Discharge Summary (Signed)
Physician Discharge Summary  Patient ID: [name] MRN: [id number] DOB/AGE: [birth year] 71 y.o.  Admit date: 02/26/2012 Discharge date: 03/04/2012  Patient Care Team: Pecola Lawless, MD as PCP - General  Admission Diagnoses: Principal Problem:  *Diverticulitis Active Problems:  HYPERLIPIDEMIA-MIXED  Anxiety state, unspecified  CAD  ALLERGIC RHINITIS  GERD  DIVERTICULOSIS  Irritable bowel syndrome  TREMOR, ESSENTIAL  Anxiety  Acute delirium  Hypertension  Discharge Diagnoses:  Principal Problem:  *Diverticulitis Active Problems:  HYPERLIPIDEMIA-MIXED  Anxiety state, unspecified  CAD  ALLERGIC RHINITIS  GERD  DIVERTICULOSIS  Irritable bowel syndrome  TREMOR, ESSENTIAL  Anxiety  Acute delirium  Hypertension   Discharged Condition: Fair (her baseline)  Hospital Course:   Pt with h/o dementia & tremors underwent lap assisted LAR resection of rectosigmoid containing diverticulitis.  Postoperatively, the patient was placed on an anti-ileus protocol.  The pt became more somnolent with some increased WOB around POD#2.  She was transferred to the ICU for more close observation.  Sedating & narcotics meds were held.  Nebs were given.  CCM & IM were consulted.  She quickly improved.  She was transferred back to the floor a few days later.   T  he patient mobilized and advanced to a solid diet gradually.  Pain was well-controlled and transitioned off IV medications.  PT/OT? & SW consultations made.  By the time of discharge, the patient was walking with 1 assist in room, eating food well, having flatus & BMs.  One episode of mild fecal incontinence.  Pain was-controlled on an oral regimen.  Based on meeting DC criteria and recovering well, I felt it was safe for the patient to be discharged with close followup.  There was vacilation on SNF vs HH by family & SW, but eventually the pt made enough modest gains that the family wished her to go home with increase in her usual Home  assist care & additional PT/OT/SW/Walker/Commode help.   SNF declined.  Instructions were discussed in detail to the patient.  They are written as well.   Consults: Internal medicine  Significant Diagnostic Studies:   Treatments: surgery: Laparoscopic takedown of splenic flexure, laparoscopic low anterior resection   Discharge Exam: Blood pressure 146/72, pulse 62, temperature 98.7 F (37.1 C), temperature source Axillary, resp. rate 16, height 5' (1.524 m), weight 143 lb 1.3 oz (64.9 kg), SpO2 96.00%.  General: Pt awake/alert in no acute distress  Eyes: PERRL, normal EOM. Sclera clear. No icterus  Neuro: CN II-XII intact w/o focal sensory/motor deficits. Left hand tremor mild & unchanged  Lymph: No head/neck/groin lymphadenopathy  Psych: No delerium/psychosis. Not anxious with stable mask face/flat affect. Calm  HENT: Normocephalic, Mucus membranes moist. No thrush  Neck: Supple, No tracheal deviation  Chest: No chest wall pain w good excursion  CV: Pulses intact. Regular rhythm  MS: Normal AROM mjr joints. No obvious deformity  Abdomen: Soft. Nondistended. Minimally tender at incisions only. Incisions clean. No cellulitis.  No incarcerated hernias.  Ext: SCDs BLE. No mjr edema. No cyanosis  Skin: No petechiae / purpurae   Disposition: 01-Home or Self Care  Discharge Orders    Future Appointments: Provider: Department: Dept Phone: Center:   03/12/2012 10:45 AM Ernestene Mention, MD [facility] [phone] None     Future Orders Please Complete By Expires   Diet - low sodium heart healthy      Increase activity slowly          Medication List  As of 03/04/2012  7:31 AM    TAKE these medications         acetaminophen 650 MG CR tablet   Commonly known as: TYLENOL   Take 1,300 mg by mouth 2 (two) times daily.      aspirin EC 81 MG tablet   Take 81 mg by mouth every morning.      buPROPion 150 MG 24 hr tablet   Commonly known as: WELLBUTRIN XL    Take 150 mg by mouth every morning.      clonazePAM 0.5 MG tablet   Commonly known as: KLONOPIN   Take 0.5 mg by mouth 3 (three) times daily.      cloNIDine 0.1 MG tablet   Commonly known as: CATAPRES   Take 0.1-0.2 mg by mouth 2 (two) times daily. 1 BY MOUTH IN THE AM, 2 BY MOUTH IN THE PM      CoQ10 100 MG Caps   Take 1 capsule by mouth daily.      diclofenac 50 MG EC tablet   Commonly known as: VOLTAREN   Take 50 mg by mouth every morning.      donepezil 10 MG tablet   Commonly known as: ARICEPT   Take 10 mg by mouth every morning.      esomeprazole 40 MG capsule   Commonly known as: NEXIUM   Take 40 mg by mouth at bedtime.      fish oil-omega-3 fatty acids 1000 MG capsule   Take 2 g by mouth daily.      loratadine 10 MG tablet   Commonly known as: CLARITIN   Take 10 mg by mouth every morning.      Melatonin 5 MG Tabs   Take 1 tablet by mouth at bedtime.      polyethylene glycol packet   Commonly known as: MIRALAX / GLYCOLAX   Take 17 g by mouth at bedtime.      propranolol 10 MG tablet   Commonly known as: INDERAL   Take 10 mg by mouth 3 (three) times daily.      rosuvastatin 20 MG tablet   Commonly known as: CRESTOR   Take 20 mg by mouth at bedtime.      SEROQUEL XR 150 MG 24 hr tablet   Generic drug: QUEtiapine Fumarate   Take 150 mg by mouth at bedtime.      traMADol 50 MG tablet   Commonly known as: ULTRAM   Take 1-2 tablets (50-100 mg total) by mouth every 6 (six) hours as needed for pain.           Follow-up Information    Follow up with Ernestene Mention, MD. Schedule an appointment as soon as possible for a visit in 10 days. (to remove staples from skin & ensure safe recovery from surgery)    Contact information:   [address] [place] [address] [phone]       Follow up with [facility]. (PT/OT/SW)    Contact information:   [address] [place] [address] [phone]          Signed: Ardeth Sportsman 03/04/2012, 7:31 AM

## 2012-03-09 ENCOUNTER — Telehealth (INDEPENDENT_AMBULATORY_CARE_PROVIDER_SITE_OTHER): Payer: Self-pay | Admitting: General Surgery

## 2012-03-09 NOTE — Telephone Encounter (Signed)
Called patient based on message in clinical pool from 03/07/12. Pt stated she is still having some pain and diarrhea. I asked the patient if the diarrhea occurs after each meal or when she takes her pain medication and she had a long pause and stated no. I asked if it happens all day (as in any given time) and the patient said no, it has calmed down quite a bit. Patient confirmed appointment already scheduled to see Dr. Derrell Lolling on 03/12/12. I advised the patient that if the pain and diarrhea increases, she develops sign of infection at the incision site or develops a fever before being seen this week on 03/12/12 to call our office immediately or go to the ER. Patient agreed.

## 2012-03-10 ENCOUNTER — Telehealth (INDEPENDENT_AMBULATORY_CARE_PROVIDER_SITE_OTHER): Payer: Self-pay | Admitting: General Surgery

## 2012-03-10 NOTE — Telephone Encounter (Signed)
Pt's husband called to ask about an area of drainage; questions if a staple may have come out.  Area is draining serous fluid only.  No odor or signs of infections; pt is afebrile.  Coming to office in 48 hours for scheduled appt.  Advised husband to cleanse area with soap and water, rinse and cover with DSD.  He understands and will comply.

## 2012-03-12 ENCOUNTER — Encounter (INDEPENDENT_AMBULATORY_CARE_PROVIDER_SITE_OTHER): Payer: Self-pay | Admitting: General Surgery

## 2012-03-12 ENCOUNTER — Ambulatory Visit (INDEPENDENT_AMBULATORY_CARE_PROVIDER_SITE_OTHER): Payer: BC Managed Care – PPO | Admitting: General Surgery

## 2012-03-12 VITALS — BP 114/68 | HR 64 | Temp 98.1°F | Resp 18 | Ht 60.5 in | Wt 128.4 lb

## 2012-03-12 DIAGNOSIS — K5732 Diverticulitis of large intestine without perforation or abscess without bleeding: Secondary | ICD-10-CM

## 2012-03-12 DIAGNOSIS — K5792 Diverticulitis of intestine, part unspecified, without perforation or abscess without bleeding: Secondary | ICD-10-CM

## 2012-03-12 DIAGNOSIS — K5730 Diverticulosis of large intestine without perforation or abscess without bleeding: Secondary | ICD-10-CM

## 2012-03-12 NOTE — Progress Notes (Signed)
Patient ID: [name], female   DOB: 01-Jun-1940, 71 y.o.   MRN: [id number] History: This patient underwent laparoscopic assisted low anterior resection on 02/26/2012. She had severe diverticulitis. EEA stapled anastomosis. Hospital course was complicated by delirium requiring internal medicine consultation. We took her off several of her anti-anxiety medications. We continued her Aricept. Since discharge she is slowly improving. [facility] is providing nursing and physical therapy visits. Mr. [name] was provided continuous in house care. The patient ambulates in the back yard with a walker. She is tolerating a diet. Having normal bowel movements. No fevers. Mr. [name] says that her mental status is slowly improving. 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 Very flattened affect. Mild tremor. 98 1. Pulse 64. BP 114/68. Weight 128 pounds. Respirations 18 and unlabored. Lungs clear to auscultation bilaterally Abdomen soft. Nontender. Lower midline incision appears to be healing without any signs of infection. There was a little bit of drainage. I probed the wound. There was no pockets of fluid. The rest of her staples were removed. It was redressed.  Assessment: Severe diverticulitis, recovering slowly but without surgical complications following laparoscopic-assisted low anterior resection Chronic anxiety superimposed on early dementia, suspected. Followed by Dr. Alwyn Ren and by psychiatry as outpatient continues to take Wellbutrin, Klonopin, Aricept,  Plan : diet and activities discussed.  Advised internal medicine consultation with Dr. Alwyn Ren, her primary care physician in the near future to review all medications and coordinate care with her psychiatrist Return to see me in one month.    Angelia Mould. Derrell Lolling, M.D., [facility] General and Minimally invasive Surgery Breast and Colorectal Surgery Office:    [phone] Pager:   [phone]

## 2012-03-12 NOTE — Patient Instructions (Signed)
You appear to be recovering from your colon resection without any major surgical complications.  I advise that you take a walk twice a day and try to increase your exercise tolerance on a daily basis.  I recommend that you make an appointment to review your medications and medical problems with Dr. Alwyn Ren in the near future.  Return to see Dr. Derrell Lolling in one month.

## 2012-03-18 ENCOUNTER — Encounter (INDEPENDENT_AMBULATORY_CARE_PROVIDER_SITE_OTHER): Payer: Self-pay | Admitting: General Surgery

## 2012-03-18 NOTE — Progress Notes (Signed)
Faxed signed plan of care back to the attention of Dellis Anes at [facility]. Fax # [phone], confirmation was received. Sent to medical records to be scanned under orders.

## 2012-03-23 ENCOUNTER — Encounter (INDEPENDENT_AMBULATORY_CARE_PROVIDER_SITE_OTHER): Payer: Self-pay | Admitting: General Surgery

## 2012-03-23 NOTE — Progress Notes (Signed)
Signed face to face encounter by Dr. Derrell Lolling faxed to [facility] attention Dellis Anes fax # [phone]. Confirmation received, sent to medical records to be scanned into the chart.

## 2012-03-26 ENCOUNTER — Encounter: Payer: Self-pay | Admitting: Internal Medicine

## 2012-03-26 ENCOUNTER — Telehealth (INDEPENDENT_AMBULATORY_CARE_PROVIDER_SITE_OTHER): Payer: Self-pay

## 2012-03-26 ENCOUNTER — Ambulatory Visit (INDEPENDENT_AMBULATORY_CARE_PROVIDER_SITE_OTHER): Payer: BC Managed Care – PPO | Admitting: Internal Medicine

## 2012-03-26 VITALS — BP 118/68 | HR 58 | Wt 131.0 lb

## 2012-03-26 DIAGNOSIS — T148XXA Other injury of unspecified body region, initial encounter: Secondary | ICD-10-CM

## 2012-03-26 DIAGNOSIS — IMO0002 Reserved for concepts with insufficient information to code with codable children: Secondary | ICD-10-CM

## 2012-03-26 MED ORDER — MUPIROCIN 2 % EX OINT: TOPICAL_OINTMENT | CUTANEOUS | Status: DC

## 2012-03-26 NOTE — Progress Notes (Signed)
  Subjective:    Patient ID: [name], female    DOB: February 03, 1941, 71 y.o.   MRN: [id number]  HPI  The laparoscopic partial colectomy  02/26/12 operative incision in the lower L abdomen was covered with a bandage; in attempting to remove the bandage she sustained a laceration 03/23/12. There was some oozing of blood which was able to be stopped. She's developed some maceration and erythema. The occupational therapist questioned possible infection.    Review of Systems  She denies fever, chills sweats, or purulent drainage.     Objective:   Physical Exam General appearance is one of good health and nourishment w/o distress. Mask facies  Eyes: No conjunctival inflammation or scleral icterus is present.    Heart:  Normal rate and regular rhythm. S1 and S2 normal without gallop, murmur, click, rub or other extra sounds     Lungs:Chest clear to auscultation; no wheezes, rhonchi,rales ,or rubs present.No increased work of breathing. Decreased BS  Abdomen: bowel sounds normal, soft and non-tender without masses, organomegaly or hernias noted.  No guarding or rebound   Skin:Warm & dry.  Intact without suspicious lesions or rashes ; no jaundice or tenting.6x5 cm abrasion @ L inguinal area with minimal erythema along operative line  Lymphatic: No lymphadenopathy is noted about the head, neck, axilla.   Constant tremor L hand            Assessment & Plan:  #1 trauma with minimal cellulitis Plan: See orders and recommendations

## 2012-03-26 NOTE — Patient Instructions (Addendum)
Please review the medication list in the After Visit Summary provided.Please write the name of the prescribing physician to the right of the medication and share this with all medical staff seen at each appointment. This will help provide continuity of care; help optimize therapeutic interventions;and help prevent drug:drug adverse reaction.  Dip gauze in  sterile saline and applied to the wound twice a day. Cover the wound with Telfa , non stick dressing  without any antibiotic ointment. The saline can be purchased at the drugstore or you can make your own .Boil cup of salt in a gallon of water. Store mixture  in a clean container.Report Warning  signs as discussed (red streaks, pus, fever, increasing pain).   Fill the Bactroban ointment only if there is increasing redness, fever or pus formation. The saline dressings should allow healing

## 2012-03-26 NOTE — Telephone Encounter (Signed)
Pt called to ask if it was all right to take a bath.  I told her as long as her surgical site was completely closed and healed a bath would be fine.  She is scheduled to see Dr. Derrell Lolling again in early January.

## 2012-04-16 ENCOUNTER — Ambulatory Visit (INDEPENDENT_AMBULATORY_CARE_PROVIDER_SITE_OTHER): Payer: BC Managed Care – PPO | Admitting: General Surgery

## 2012-04-16 ENCOUNTER — Encounter (INDEPENDENT_AMBULATORY_CARE_PROVIDER_SITE_OTHER): Payer: Self-pay | Admitting: General Surgery

## 2012-04-16 VITALS — BP 122/84 | HR 62 | Temp 96.9°F | Resp 16 | Ht 60.0 in | Wt 133.4 lb

## 2012-04-16 DIAGNOSIS — K5732 Diverticulitis of large intestine without perforation or abscess without bleeding: Secondary | ICD-10-CM

## 2012-04-16 DIAGNOSIS — K5792 Diverticulitis of intestine, part unspecified, without perforation or abscess without bleeding: Secondary | ICD-10-CM

## 2012-04-16 NOTE — Progress Notes (Signed)
Patient ID: [name], female   DOB: 16-May-1940, 72 y.o.   MRN: [id number] History: This patient returns for followup regarding her laparoscopic assisted low anterior resection. This was performed on 02/26/2012 for severe diverticulitis. EEA stapled anastomosis. Hospital course was complicated by delirium requiring internal medicine consultation. We took her off several of her anti-anxiety medications and continue her Aricept. Since discharge she has a markedly improved. She has seen Dr. Alwyn Ren. She is taking Klonopin, Aricept, and Seroquel. She is on Inderal for Parkinson's tremor. She says that her  appetite is good.  She wants to know when she can exercise and do abdominal exercises again. She takes mineral wall when she needs to. Says she has a bowel movement about every other day. No bowel pain or wound problems.  Exam: Patient looks well. She is still mildly confused but much more alert and oriented today. She is well dressed. Vital signs stable. Abdomen soft. Nontender. Laparoscopic wounds have all healed well. No infection. No hernia. Nontender. Nondistended.  Assessment: Severe diverticulitis, she has recovered slowly but without surgical complications following laparoscopic-assisted low anterior resection. Chronic anxiety superimposed on early dementia, suspected. Followed by Dr. Alwyn Ren  and by psychiatry as outpatient. 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 Encouraged high-fiber, low-fat diet and stressed hydration Encouraged Metamucil rather than mineral oil Advised colonoscopy in one year Okay to resume normal physical activities without obstruction Return to see me when necessary.    Angelia Mould. Derrell Lolling, M.D., [facility] General and Minimally invasive Surgery Breast and Colorectal Surgery Office:   [phone] Pager:   [phone]

## 2012-04-16 NOTE — Patient Instructions (Signed)
You are now making a very good recovery from your colon resection for diverticulitis.  Your  exam today shows that all of your wounds have healed well without any complications.  You may resume normal physical exercise, including going to the gym without restriction  I strongly advise a high fiber, low fat diet with lots of water and hydration.  You should contact your gastroenterologist and get a colonoscopy in one year.  Return to see Dr. Derrell Lolling if further problems arise.

## 2012-05-04 ENCOUNTER — Telehealth (INDEPENDENT_AMBULATORY_CARE_PROVIDER_SITE_OTHER): Payer: Self-pay | Admitting: General Surgery

## 2012-05-04 NOTE — Telephone Encounter (Signed)
Patient called in stating that she was discharged from care by Dr. Derrell Lolling and was told to contact him if she developed any problems. Patient stated that every time she bends, or leans against the counter she has abdominal pain. Patient last saw Dr. Derrell Lolling on 04/16/12 and was told during that visit based on the notes to contact her gastroenterologist. Patient has diverticulitis.  Patient stated that she feels this is something that requires surgery. I advised the patient that is something that has to be determined by a doctor. Patient stated that she has not contacted a gastroenterologist for follow up per Dr. Jacinto Halim request. I advised the patient I would forward her concern to Dr. Derrell Lolling and will get back to her with a response. Patient agreed.

## 2012-05-04 NOTE — Telephone Encounter (Signed)
Called patient and advised of appointment to see Dr. Derrell Lolling on 05/25/12 at 10:30. Advised patient if there was a cancellation and there was enough time to notify her I would. Patient agreed.

## 2012-05-17 ENCOUNTER — Encounter: Payer: Self-pay | Admitting: Internal Medicine

## 2012-05-17 DIAGNOSIS — M810 Age-related osteoporosis without current pathological fracture: Secondary | ICD-10-CM | POA: Insufficient documentation

## 2012-05-19 ENCOUNTER — Telehealth: Payer: Self-pay | Admitting: Internal Medicine

## 2012-05-19 NOTE — Telephone Encounter (Signed)
Message copied by Verner Chol on Tue May 19, 2012  2:07 PM ------      Message from: Marshell Garfinkel      Created: Tue May 19, 2012  1:48 PM       Lmovm advising pt to call office for appt per Dr. Frederik Pear request.      ----- Message -----         From: Pecola Lawless, MD         Sent: 05/17/2012  11:54 AM           To: Marshell Garfinkel            Please ask her to schedule appt @ her convenience to discuss BMD results. Please bring records of any prior bone building therapy, family history of Osteoporosis, past history of any fractures, & all meds & supplements (especially vitamin D3 dose).             ------

## 2012-05-19 NOTE — Telephone Encounter (Signed)
pt returned BC call - advised pt to schedule f/u-appt scheduled 2.25.14 at 3pm, pt also noted she has had previos bone density but did not keep the records

## 2012-05-25 ENCOUNTER — Encounter (INDEPENDENT_AMBULATORY_CARE_PROVIDER_SITE_OTHER): Payer: Self-pay | Admitting: General Surgery

## 2012-05-25 ENCOUNTER — Encounter: Payer: Self-pay | Admitting: Internal Medicine

## 2012-05-25 ENCOUNTER — Ambulatory Visit (INDEPENDENT_AMBULATORY_CARE_PROVIDER_SITE_OTHER): Payer: BC Managed Care – PPO | Admitting: General Surgery

## 2012-05-25 ENCOUNTER — Other Ambulatory Visit: Payer: Self-pay | Admitting: Internal Medicine

## 2012-05-25 VITALS — BP 112/74 | HR 56 | Temp 97.6°F | Resp 16 | Ht 60.0 in | Wt 135.6 lb

## 2012-05-25 DIAGNOSIS — K5732 Diverticulitis of large intestine without perforation or abscess without bleeding: Secondary | ICD-10-CM

## 2012-05-25 DIAGNOSIS — K5792 Diverticulitis of intestine, part unspecified, without perforation or abscess without bleeding: Secondary | ICD-10-CM

## 2012-05-25 NOTE — Progress Notes (Signed)
Patient ID: [name], female   DOB: 1940/06/06, 72 y.o.   MRN: [id number] History: This patient returns because she was having some abdominal pain and wanted to make sure nothing was wrong. This has actually resolved now. On 02/26/2012 she underwent a laparoscopic-assisted low anterior resection for severe diverticulitis. I last saw her on January 9 at which time she was doing well. She called a few weeks ago stating that she was having intermittent sharp pain on the right side of her abdomen when she would stretch a certain way or lean  up against the counter. She says this has now resolved. Her appetite is excellent. Her bowel movements alternate between solid and soft, consistent with her past history of irritable bowel syndrome. She takes mineral oil daily which is something she has done for years.  Exam: Patient looks well. Slight tremor. No distress. Abdomen soft. Nontender. Trocar sites and lower midline incision from her extraction site well healed. No hernia. Benign exam  Assessment: Severe, recurrent diverticulitis, recovered without obvious complication following laparoscopic-assisted low anterior resection Abdominal pain, resolved, but is likely from scar tissue or muscle spasm in the abdominal wall. No evidence of intra-abdominal pathology  Plan: Patient is reassured. Advised high-fiber low-fat diet, stress hydration. Incentive mineral, I told her to take 4-6 Metamucil capsules per day Return to see me if further problems arise.    Angelia Mould. Derrell Lolling, M.D., [facility] General and Minimally invasive Surgery Breast and Colorectal Surgery Office:   [phone] Pager:   [phone]

## 2012-05-25 NOTE — Patient Instructions (Signed)
Since your abdominal pain has resolved, and your physical exam is normal. I think that your pain was simply due to muscle spasm and scar tissue in the abdominal wall.  There is no evidence of any complication from your surgery.  For your constipation and irritable bowel syndrome, I strongly recommended that you drink 6-7 glasses of water per day. I also advised that you  take 4-6 Metamucil capsules per day.  Return to see Dr. Derrell Lolling if further problems arise.

## 2012-06-02 ENCOUNTER — Encounter: Payer: Self-pay | Admitting: Internal Medicine

## 2012-06-02 ENCOUNTER — Ambulatory Visit (INDEPENDENT_AMBULATORY_CARE_PROVIDER_SITE_OTHER): Payer: BC Managed Care – PPO | Admitting: Internal Medicine

## 2012-06-02 VITALS — BP 124/70 | HR 63 | Wt 139.4 lb

## 2012-06-02 DIAGNOSIS — M8580 Other specified disorders of bone density and structure, unspecified site: Secondary | ICD-10-CM

## 2012-06-02 DIAGNOSIS — M899 Disorder of bone, unspecified: Secondary | ICD-10-CM

## 2012-06-02 MED ORDER — ALENDRONATE SODIUM 70 MG PO TABS: 70.0000 mg | ORAL_TABLET | ORAL | Status: DC

## 2012-06-02 NOTE — Assessment & Plan Note (Signed)
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 A trial of once weekly agent will be initiated. Bone mineral density should be monitored after 12 months of this therapy if tolerated.

## 2012-06-02 NOTE — Progress Notes (Signed)
  Subjective:    Patient ID: [name], female    DOB: September 07, 1940, 72 y.o.   MRN: [id number]  HPI  The bone density report was reviewed & the problem list updated. She has not been not on calcium since Summer 2013 because of constipation. She is also not taking vitamin D.  She's had significant bone loss in both femurs based on serial abnormal densities. She has never taken bone building therapy.  She did fracture her ankle in a fall. There is no family history of osteoporosis  She has had a history of PPI therapy; she has no history of esophageal stricture.    Review of Systems  She denies any dysphagia, abdominal pain, or unexplained weight loss. She's also had no melena or rectal bleeding.     Objective:   Physical Exam  Gen.: Adequately nourished in appearance. Alert, appropriate and cooperative throughout exam. Appears younger than stated age  Neck: slight decrease in lateral ROM Musculoskeletal/extremities:  Accentuated curvature of upper thoracic spine. No clubbing, cyanosis, edema, or significant extremity  deformity noted. Range of motion normal .Tone & strength  Normal. Joints reveal minor DIP DJD changes . Nail health good. Able to lie down & sit up w/o help. Negative SLR bilaterally Neurologic: Alert and oriented x3. Tremor of hands.  Skin: Intact without suspicious lesions or rashes. Psych: Normally interactive                                                                                       Assessment & Plan:

## 2012-06-02 NOTE — Patient Instructions (Addendum)
Review and correct the record as indicated. Please share record with your Gynecologist.

## 2012-06-03 ENCOUNTER — Encounter: Payer: Self-pay | Admitting: Internal Medicine

## 2012-06-03 LAB — CALCIUM: Calcium: 9.1 mg/dL (ref 8.4–10.5)

## 2012-06-04 ENCOUNTER — Encounter: Payer: Self-pay | Admitting: Internal Medicine

## 2012-06-05 ENCOUNTER — Encounter: Payer: Self-pay | Admitting: Internal Medicine

## 2012-06-06 LAB — VITAMIN D 1,25 DIHYDROXY
Vitamin D 1, 25 (OH)2 Total: 54 pg/mL (ref 18–72)
Vitamin D2 1, 25 (OH)2: <8 pg/mL

## 2012-06-09 ENCOUNTER — Other Ambulatory Visit: Payer: Self-pay

## 2012-06-09 ENCOUNTER — Encounter: Payer: Self-pay | Admitting: Internal Medicine

## 2012-06-09 DIAGNOSIS — M8580 Other specified disorders of bone density and structure, unspecified site: Secondary | ICD-10-CM

## 2012-06-09 MED ORDER — ALENDRONATE SODIUM 70 MG PO TABS: 70.0000 mg | ORAL_TABLET | ORAL | Status: DC

## 2012-07-06 ENCOUNTER — Encounter: Payer: Self-pay | Admitting: Internal Medicine

## 2012-07-08 ENCOUNTER — Encounter: Payer: Self-pay | Admitting: Internal Medicine

## 2012-07-08 ENCOUNTER — Ambulatory Visit (INDEPENDENT_AMBULATORY_CARE_PROVIDER_SITE_OTHER): Payer: BC Managed Care – PPO | Admitting: Internal Medicine

## 2012-07-08 VITALS — BP 118/76 | HR 53

## 2012-07-08 DIAGNOSIS — M7541 Impingement syndrome of right shoulder: Secondary | ICD-10-CM

## 2012-07-08 DIAGNOSIS — R946 Abnormal results of thyroid function studies: Secondary | ICD-10-CM

## 2012-07-08 DIAGNOSIS — L659 Nonscarring hair loss, unspecified: Secondary | ICD-10-CM

## 2012-07-08 NOTE — Progress Notes (Signed)
Subjective:    Patient ID: [name], female    DOB: 1941-01-10, 72 y.o.   MRN: [id number]  HPI #1 Progressive alopecia over past month w/o specific trigger such as new hair treatments.She underwent major GI surgery in 02/2012.TSH was 0.53 @ that time.   #2 The pain began in 04/2012 in R shoulder  without associated injury or trigger. It is described as  sharp cramping up to level 9. The pain does not radiates into R biceps. The discomfort last seconds. It is exacerbated by posterior rotation RUE, closing car door & lifting weight < 5 #. There is no associated signs/symptoms of redness, swelling ,stiffness ,skin color change, or temperature change The pain was treated with  heat  ; response was significant but temporary                                                                                        Review of Systems Constitutional: no fever, chills, sweats, change in weight  Musculoskeletal:no  muscle cramps or pain Neuro: no weakness; incontinence (stool/urine); numbness and tingling Heme:no lymphadenopathy; abnormal bruising or bleeding      Objective:   Physical Exam Gen.: Well-nourished in appearance. Alert, appropriate and cooperative throughout exam. Head: Normocephalic without obvious abnormalities; no definite alopecia . Scalp healthy Eyes: No corneal or conjunctival inflammation noted. No lid lag or proptosis. Extraocular motion intact.Slight ptosis. Mouth: Oral mucosa and oropharynx reveal no lesions or exudates. Teeth in good repair. Neck: No deformities, masses, or tenderness noted. Range of motion decreased. Thyroid normal.                       Musculoskeletal/extremities: Accentuated curvature of upper thoracic  Spine. No clubbing, cyanosis, edema, or significant extremity  deformity noted. Tone & strength  Normal.Joints normal . Nail health good. Range of motion of the shoulders is surprisingly good. She has pain with passive rotation of the right  shoulder. She is able to elevate both shoulders superiorly without pain. Neurologic: Alert and oriented x3. Deep tendon reflexes symmetrical and normal. Tremor LUE > RUE         Skin: Intact without suspicious lesions or rashes. Lymph: No cervical, axillary lymphadenopathy present. Psych: Mood and affect are normal. Normally interactive                                                                                       Assessment & Plan:  #1 alopecia present for one month, progressive by history. Clinically her scalp appears healthy with no evidence of infection. There is no significant hair loss clinically. The most likely cause would be the major stress related to surgery in late 2013. Her TSH was low normal at that time  #2 shoulder impingement syndrome. Multiple drug intolerances limit options. Physical  therapy recommended in addition to topical agents.

## 2012-07-08 NOTE — Patient Instructions (Addendum)
Use an anti-inflammatory cream such as Aspercreme or Zostrix cream twice a day to R shoulder as needed. In lieu of this warm moist compresses or  hot water bottle can be used. Do not apply ice. Use T-Gel , a coal tar shampoo, one 2 times per week. This will have an antibacterial effect on scalp lesions.

## 2012-07-20 ENCOUNTER — Ambulatory Visit: Payer: BC Managed Care – PPO | Attending: Internal Medicine

## 2012-07-20 DIAGNOSIS — IMO0001 Reserved for inherently not codable concepts without codable children: Secondary | ICD-10-CM | POA: Insufficient documentation

## 2012-07-20 DIAGNOSIS — M25619 Stiffness of unspecified shoulder, not elsewhere classified: Secondary | ICD-10-CM | POA: Insufficient documentation

## 2012-07-20 DIAGNOSIS — M25519 Pain in unspecified shoulder: Secondary | ICD-10-CM | POA: Insufficient documentation

## 2012-08-03 ENCOUNTER — Encounter: Payer: Self-pay | Admitting: Internal Medicine

## 2012-08-04 ENCOUNTER — Telehealth: Payer: Self-pay | Admitting: Internal Medicine

## 2012-08-04 NOTE — Telephone Encounter (Signed)
Left message on voicemail for patient to return call to clarify message, I am unable to make out exactly what patient is requesting.

## 2012-08-04 NOTE — Telephone Encounter (Signed)
Patient called back indication that she was referred to physical therapy for impinged nerve and orders was sent to our office to be signed and faxed back. Patient would like to confirm that we received fax and completed.  I reviewed current paperwork to be reviewed by Dr.Hopper and no document present from physical therapy. Reviewed green file folder, no documents   Hopp please advise if you have seen paperwork on patient

## 2012-08-04 NOTE — Telephone Encounter (Signed)
Pt called about needing dr hopper to give [place] to treat her. She states that she tried emailing dr hopper on my chart but no answer back. PT would like a call back. thanks

## 2012-08-04 NOTE — Telephone Encounter (Signed)
Please ask physical therapy to fax the papers to your attention at the fax number of your choice

## 2012-08-05 ENCOUNTER — Encounter: Payer: Self-pay | Admitting: Internal Medicine

## 2012-08-05 NOTE — Telephone Encounter (Signed)
Patient was informed via Mychart to have papers faxed to side B, fax number given

## 2012-08-12 ENCOUNTER — Ambulatory Visit: Payer: BC Managed Care – PPO | Attending: Internal Medicine | Admitting: Rehabilitation

## 2012-08-12 DIAGNOSIS — M25519 Pain in unspecified shoulder: Secondary | ICD-10-CM | POA: Insufficient documentation

## 2012-08-12 DIAGNOSIS — M25619 Stiffness of unspecified shoulder, not elsewhere classified: Secondary | ICD-10-CM | POA: Insufficient documentation

## 2012-08-12 DIAGNOSIS — IMO0001 Reserved for inherently not codable concepts without codable children: Secondary | ICD-10-CM | POA: Insufficient documentation

## 2012-08-13 ENCOUNTER — Ambulatory Visit: Payer: BC Managed Care – PPO | Admitting: Rehabilitation

## 2012-08-18 ENCOUNTER — Ambulatory Visit: Payer: BC Managed Care – PPO | Admitting: Rehabilitation

## 2012-08-20 ENCOUNTER — Ambulatory Visit: Payer: BC Managed Care – PPO | Admitting: Rehabilitation

## 2012-08-24 ENCOUNTER — Ambulatory Visit: Payer: BC Managed Care – PPO

## 2012-09-01 ENCOUNTER — Ambulatory Visit: Payer: BC Managed Care – PPO

## 2012-09-03 ENCOUNTER — Ambulatory Visit: Payer: BC Managed Care – PPO

## 2012-09-07 ENCOUNTER — Ambulatory Visit
Payer: BC Managed Care – PPO | Attending: Internal Medicine | Admitting: Rehabilitative and Restorative Service Providers"

## 2012-09-07 DIAGNOSIS — IMO0001 Reserved for inherently not codable concepts without codable children: Secondary | ICD-10-CM | POA: Insufficient documentation

## 2012-09-07 DIAGNOSIS — M25519 Pain in unspecified shoulder: Secondary | ICD-10-CM | POA: Insufficient documentation

## 2012-09-07 DIAGNOSIS — M25619 Stiffness of unspecified shoulder, not elsewhere classified: Secondary | ICD-10-CM | POA: Insufficient documentation

## 2012-09-10 ENCOUNTER — Encounter: Payer: BC Managed Care – PPO | Admitting: Rehabilitative and Restorative Service Providers"

## 2012-09-15 ENCOUNTER — Ambulatory Visit: Payer: BC Managed Care – PPO | Admitting: Rehabilitation

## 2012-09-17 ENCOUNTER — Ambulatory Visit: Payer: BC Managed Care – PPO

## 2012-09-22 ENCOUNTER — Encounter: Payer: BC Managed Care – PPO | Admitting: Rehabilitation

## 2012-09-29 ENCOUNTER — Ambulatory Visit: Payer: BC Managed Care – PPO | Admitting: Physical Therapy

## 2012-10-01 ENCOUNTER — Ambulatory Visit: Payer: BC Managed Care – PPO | Admitting: Rehabilitation

## 2012-11-11 ENCOUNTER — Other Ambulatory Visit: Payer: Self-pay

## 2012-11-23 ENCOUNTER — Other Ambulatory Visit: Payer: Self-pay | Admitting: Internal Medicine

## 2012-11-24 ENCOUNTER — Other Ambulatory Visit: Payer: Self-pay | Admitting: Internal Medicine

## 2012-11-24 ENCOUNTER — Telehealth: Payer: Self-pay | Admitting: Internal Medicine

## 2012-11-24 NOTE — Telephone Encounter (Signed)
Spoke with patient and she states she has had abdominal pain for 2 weeks and thinks she needs to be seen. She cannot come today. Scheduled with Doug Sou, PA on 11/25/12 at 3:00 PM.

## 2012-11-25 ENCOUNTER — Encounter: Payer: Self-pay | Admitting: *Deleted

## 2012-11-25 ENCOUNTER — Ambulatory Visit (INDEPENDENT_AMBULATORY_CARE_PROVIDER_SITE_OTHER): Payer: BC Managed Care – PPO | Admitting: Gastroenterology

## 2012-11-25 DIAGNOSIS — R109 Unspecified abdominal pain: Secondary | ICD-10-CM

## 2012-11-25 DIAGNOSIS — K589 Irritable bowel syndrome without diarrhea: Secondary | ICD-10-CM

## 2012-11-25 MED ORDER — RIFAXIMIN 550 MG PO TABS: 550.0000 mg | ORAL_TABLET | Freq: Two times a day (BID) | ORAL | Status: AC

## 2012-11-25 NOTE — Patient Instructions (Addendum)
We have given you samples of Xifaxan  for 10 days.  Take 1 tab twice daily with food. Call us back in 2 weeks with a progress report.

## 2012-11-26 ENCOUNTER — Encounter: Payer: Self-pay | Admitting: Gastroenterology

## 2012-11-26 NOTE — Progress Notes (Signed)
11/26/2012 [name] [id number] 03/31/1941   History of Present Illness:  Patient is a 72 year old female who is a patient of Dr. Regino Schultze.  She has history of IBS as well as recurrent diverticulitis for which she had laparoscopic sigmoid resection in 02/2012.  She has several medication allergies/intolerances.  Comes in today with complaints of lower abdominal pains and gas/bloating for the past 2 weeks.  Describes it as a constant ache.  She has tried taking Gas-X, Zantac, and dicyclomine without relief.  Pain does not keep her up at night.  She denies nausea, vomiting, fevers, and chills.  She's undergone multiple CT scans over the years.  Most recent colonoscopy was 12/2011, prior to her surgery, which showed severe diverticulosis in the sigmoid colon and a polyp that was a TA in the sigmoid colon as well.  Current Medications, Allergies, Past Medical History, Past Surgical History, Family History and Social History were reviewed in [employer] record.   Physical Exam: There were no vitals taken for this visit. General: Elderly, white female in no acute distress Head: Normocephalic and atraumatic Eyes:  Sclerae anicteric, conjunctiva pink  Ears: Normal auditory acuity Lungs: Clear throughout to auscultation Heart: Regular rate and rhythm. Abdomen: Soft, non-distended. No masses, no hepatomegaly. Normal bowel sounds.  Previous laparotomy scar noted.  Mild lower abdominal TTP without R/R/G. Musculoskeletal: Symmetrical with no gross deformities  Extremities: No edema  Neurological: Alert oriented x 4, grossly nonfocal Psychological:  Alert and cooperative. Normal mood and affect  Assessment and Recommendations: -Abdominal pain with gas and bloating:  Likely IBS related.  Abdominal exam is benign.   -S/P laparoscopic sigmoid colectomy in 02/2012 due to recurrent issues with diverticulitis.  *Will try Xifaxan 550 mg BID for ten days.  Treatment is limited due to  several medication allergies/intolerances. *No repeat imaging or evaluation needed at this time.

## 2012-11-26 NOTE — Progress Notes (Signed)
Reviewed and agree. IBS likely

## 2013-01-18 IMAGING — CT CT ABD-PELV W/ CM
2 of 5 series · 17 of 46 positions shown, 19 images · IV contrast (Omnipaque 300)
Comparison: CT scan 10/12/2011.

CLINICAL DATA: Abdominal pain and diarrhea.  Evaluate
diverticulitis.

CT ABDOMEN AND PELVIS WITH CONTRAST
TECHNIQUE: Multidetector CT imaging of the abdomen and pelvis was
performed following the standard protocol during bolus
administration of intravenous contrast.
Contrast: 100mL OMNIPAQUE IOHEXOL 300 MG/ML  SOLN

[Series 2: abd/ pel 5mm · axial · 0.70mm/px · z∈[-396,-42]mm · 14 of 79 slices shown, 16 images]
[im 4/79  soft-tissue]
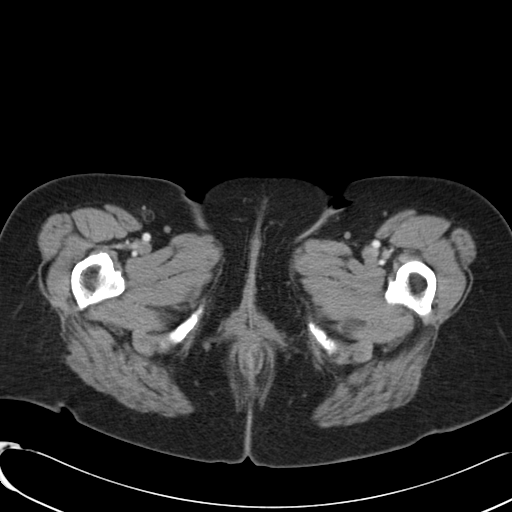
[im 4/79  bone]
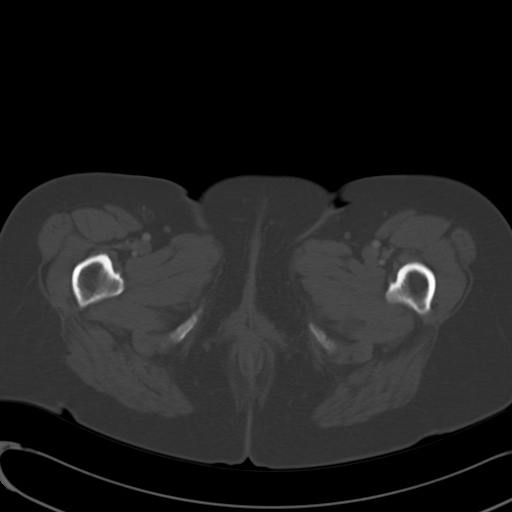
[im 12/79  soft-tissue]
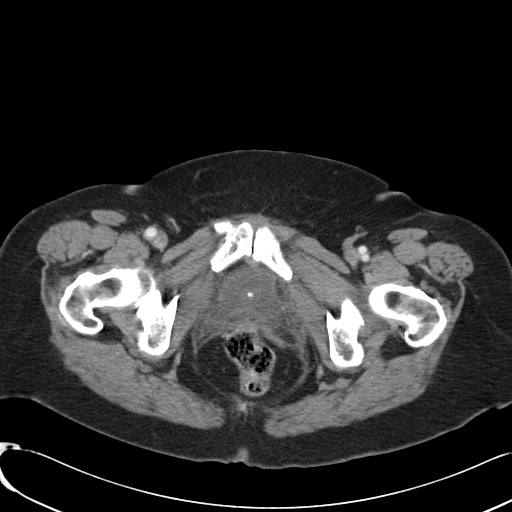
[im 16/79  soft-tissue]
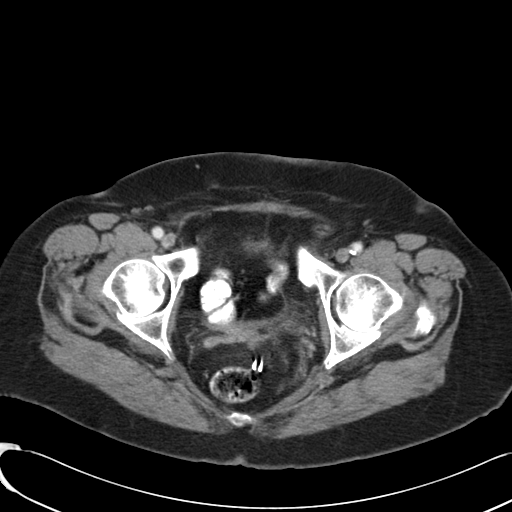
[im 20/79  soft-tissue]
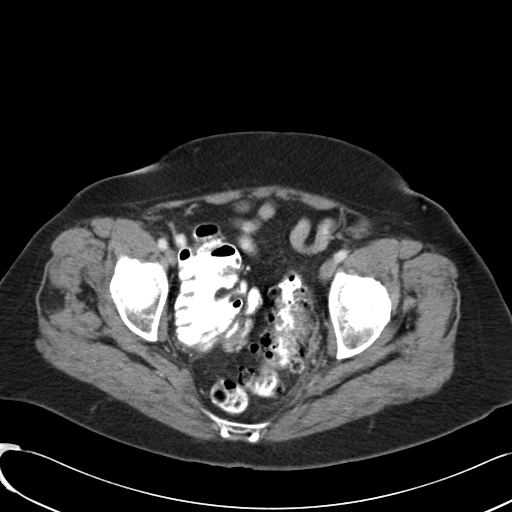
[im 28/79  soft-tissue]
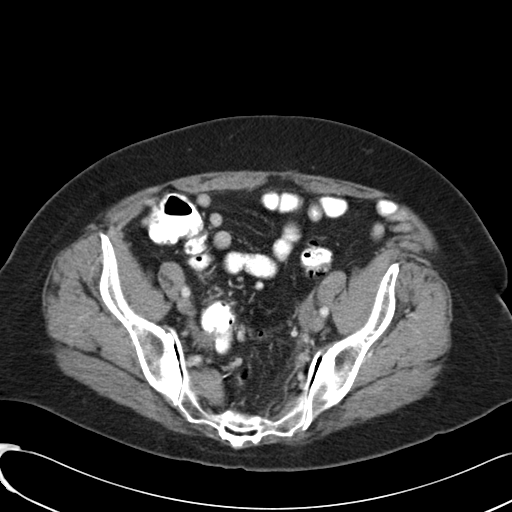
[im 32/79  soft-tissue]
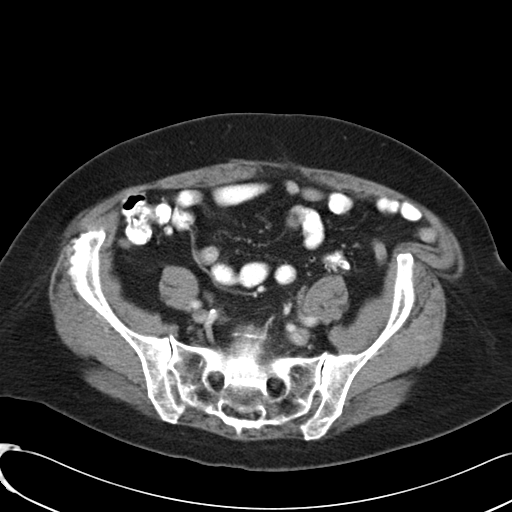
[im 36/79  soft-tissue]
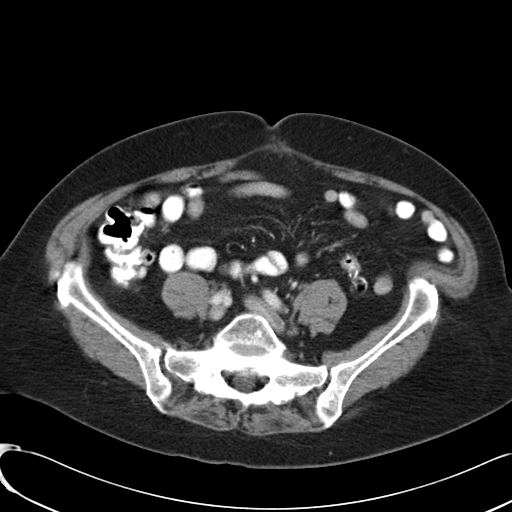
[im 43/79  soft-tissue]
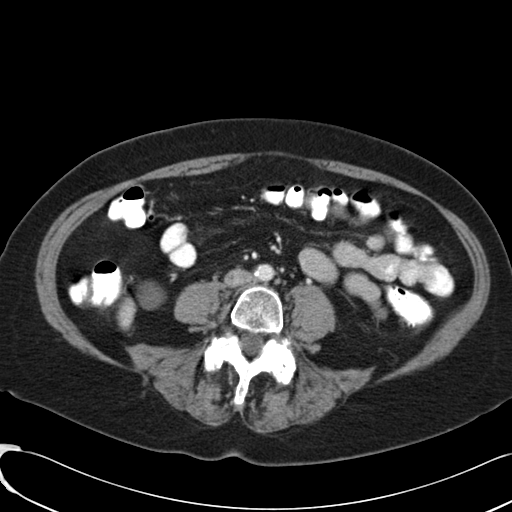
[im 47/79  soft-tissue]
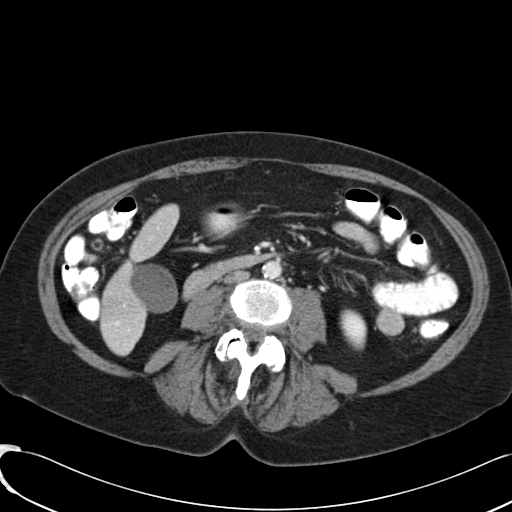
[im 47/79  bone]
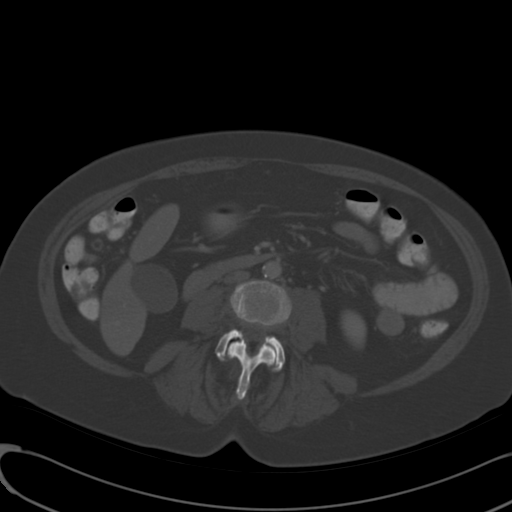
[im 51/79  soft-tissue]
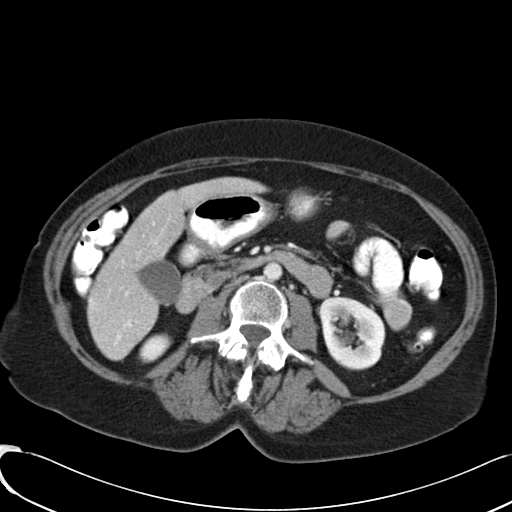
[im 59/79  soft-tissue]
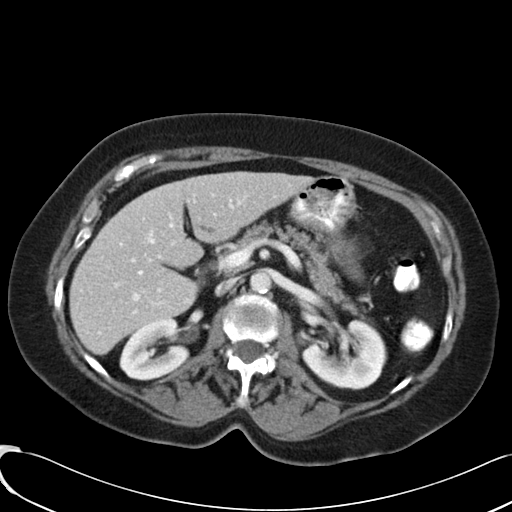
[im 63/79  soft-tissue]
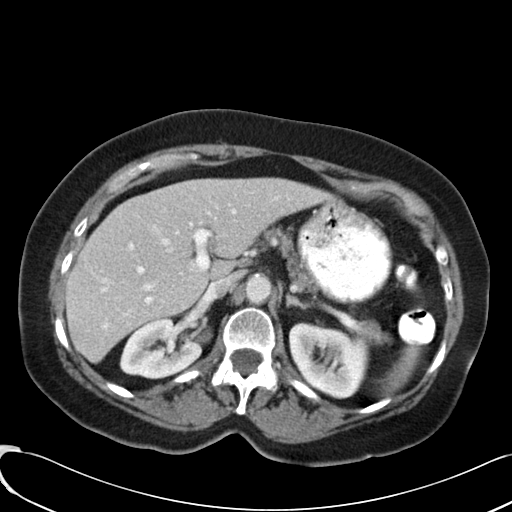
[im 67/79  soft-tissue]
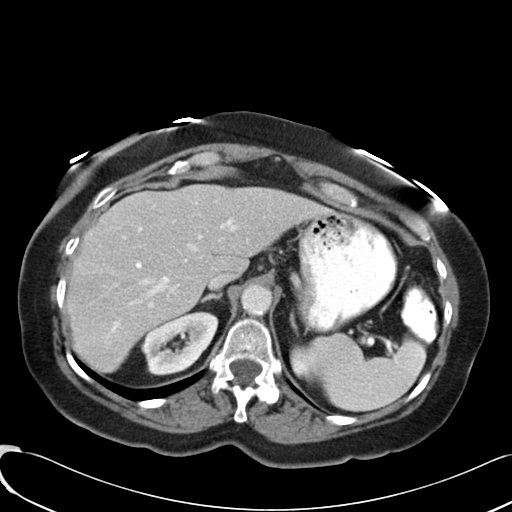
[im 75/79  soft-tissue]
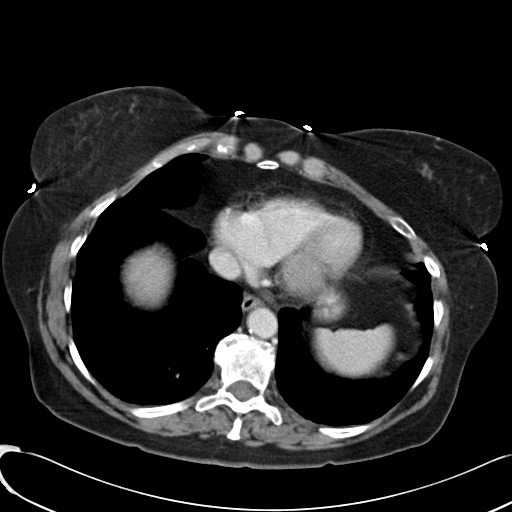

[Series 602: cor · coronal · 0.79mm/px · 3 of 102 slices shown]
[im 34/102  soft-tissue]
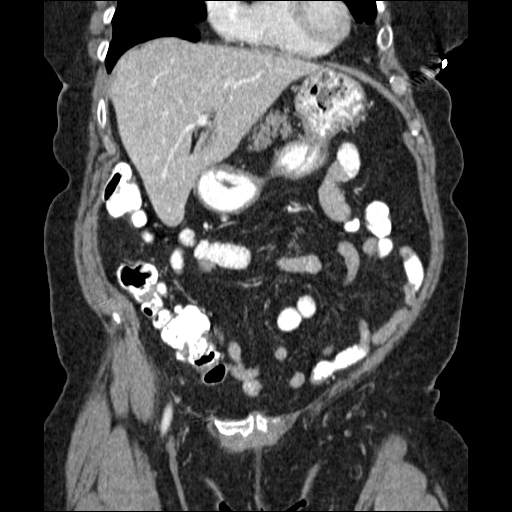
[im 45/102  soft-tissue]
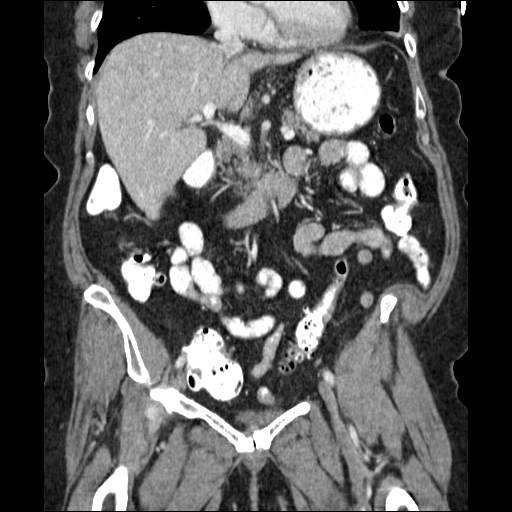
[im 57/102  soft-tissue]
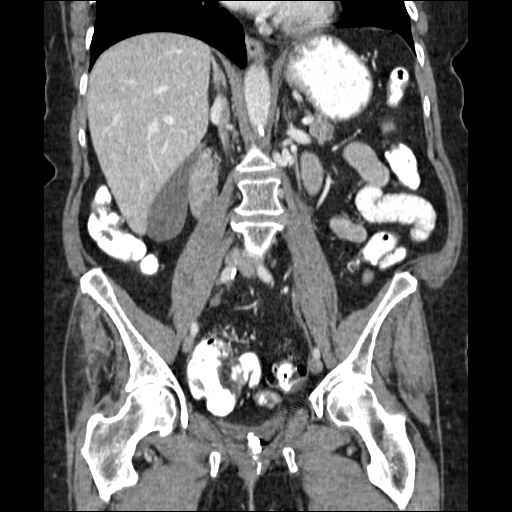

[17 of 46 positions shown; findings below may reference images not displayed]

FINDINGS: The lung bases are clear.  No pleural effusion or
pulmonary nodule.

The liver is unremarkable.  No focal hepatic lesions or
intrahepatic biliary dilatation to the gallbladder is normal.  No
common bile duct dilatation.  The pancreas is unremarkable.  The
spleen is normal.

The adrenal glands and kidneys are stable.  There are scarring
changes involving the right kidney but no renal or obstructing
ureteral calculi and no renal mass.

The stomach, duodenum, small bowel and colon are unremarkable
except for mild diverticulitis  involving the mid sigmoid colon.
Persistent mild wall thickening and pericolonic inflammatory
change.  No findings for diverticular abscess or free air.

No mesenteric or retroperitoneal mass or adenopathy.  The aorta is
normal in caliber.  Mild atherosclerotic changes.

The uterus is surgically absent.  There are surgical changes from
previous pelvic floor surgery.  No pelvic mass, adenopathy or free
pelvic fluid collections.  No inguinal mass or hernia.  The bony
structures are unremarkable.  Stable SI joint degenerative changes
and lower lumbar facet disease.
IMPRESSION: Minimal improvement of mid sigmoid diverticulitis.  No progression
of disease, abscess or free air.

## 2013-02-11 ENCOUNTER — Other Ambulatory Visit: Payer: Self-pay

## 2013-03-02 ENCOUNTER — Other Ambulatory Visit: Payer: Self-pay | Admitting: Cardiology

## 2013-04-15 ENCOUNTER — Other Ambulatory Visit: Payer: Self-pay | Admitting: Dermatology

## 2013-04-20 ENCOUNTER — Telehealth: Payer: Self-pay | Admitting: *Deleted

## 2013-04-20 DIAGNOSIS — M8580 Other specified disorders of bone density and structure, unspecified site: Secondary | ICD-10-CM

## 2013-04-20 NOTE — Telephone Encounter (Signed)
Patient called and stated that she has an apt on Feb 9th for a mammogram but dr hopper would like for her to have a bone density test too. Patient states that [facility] needs an order for it.    Fax number [phone]

## 2013-04-23 NOTE — Telephone Encounter (Signed)
Bone Density Scan ordered and faxed to [facility].//AB/CMA

## 2013-04-29 ENCOUNTER — Other Ambulatory Visit: Payer: Self-pay | Admitting: Dermatology

## 2013-05-10 ENCOUNTER — Other Ambulatory Visit: Payer: Self-pay | Admitting: Internal Medicine

## 2013-05-10 NOTE — Telephone Encounter (Signed)
Alendronate refilled per protocol. JG//CMA

## 2013-05-12 ENCOUNTER — Encounter: Payer: Self-pay | Admitting: *Deleted

## 2013-05-13 ENCOUNTER — Ambulatory Visit (INDEPENDENT_AMBULATORY_CARE_PROVIDER_SITE_OTHER): Payer: BC Managed Care – PPO | Admitting: Cardiology

## 2013-05-13 ENCOUNTER — Encounter: Payer: Self-pay | Admitting: *Deleted

## 2013-05-13 ENCOUNTER — Encounter: Payer: Self-pay | Admitting: Cardiology

## 2013-05-13 VITALS — BP 122/60 | HR 57 | Ht 60.0 in | Wt 147.0 lb

## 2013-05-13 DIAGNOSIS — I2510 Atherosclerotic heart disease of native coronary artery without angina pectoris: Secondary | ICD-10-CM

## 2013-05-13 DIAGNOSIS — E785 Hyperlipidemia, unspecified: Secondary | ICD-10-CM

## 2013-05-13 DIAGNOSIS — R5383 Other fatigue: Secondary | ICD-10-CM

## 2013-05-13 DIAGNOSIS — R5381 Other malaise: Secondary | ICD-10-CM

## 2013-05-13 LAB — LIPID PANEL
CHOLESTEROL: 145 mg/dL (ref 0–200)
HDL: 35.5 mg/dL — AB (ref >39.00)
Total CHOL/HDL Ratio: 4
Triglycerides: 429 mg/dL — ABNORMAL HIGH (ref 0.0–149.0)
VLDL: 85.8 mg/dL — AB (ref 0.0–40.0)

## 2013-05-13 LAB — CBC WITH DIFFERENTIAL/PLATELET
Basophils Absolute: 0 10*3/uL (ref 0.0–0.1)
Basophils Relative: 0.4 % (ref 0.0–3.0)
EOS PCT: 2.4 % (ref 0.0–5.0)
Eosinophils Absolute: 0.2 10*3/uL (ref 0.0–0.7)
HCT: 40.3 % (ref 36.0–46.0)
Hemoglobin: 13.6 g/dL (ref 12.0–15.0)
LYMPHS PCT: 33.4 % (ref 12.0–46.0)
Lymphs Abs: 2.3 10*3/uL (ref 0.7–4.0)
MCHC: 33.8 g/dL (ref 30.0–36.0)
MCV: 100.5 fl — AB (ref 78.0–100.0)
MONO ABS: 0.8 10*3/uL (ref 0.1–1.0)
Monocytes Relative: 11 % (ref 3.0–12.0)
NEUTROS PCT: 52.8 % (ref 43.0–77.0)
Neutro Abs: 3.7 10*3/uL (ref 1.4–7.7)
PLATELETS: 186 10*3/uL (ref 150.0–400.0)
RBC: 4.01 Mil/uL (ref 3.87–5.11)
RDW: 12.4 % (ref 11.5–14.6)
WBC: 6.9 10*3/uL (ref 4.5–10.5)

## 2013-05-13 LAB — BASIC METABOLIC PANEL
BUN: 15 mg/dL (ref 6–23)
CHLORIDE: 104 meq/L (ref 96–112)
CO2: 30 mEq/L (ref 19–32)
Calcium: 9.5 mg/dL (ref 8.4–10.5)
Creatinine, Ser: 0.7 mg/dL (ref 0.4–1.2)
GFR: 90.29 mL/min (ref >60.00)
GLUCOSE: 85 mg/dL (ref 70–99)
POTASSIUM: 4.1 meq/L (ref 3.5–5.1)
SODIUM: 138 meq/L (ref 135–145)

## 2013-05-13 LAB — TSH: TSH: 1.66 u[IU]/mL (ref 0.35–5.50)

## 2013-05-13 LAB — LDL CHOLESTEROL, DIRECT: Direct LDL: 67.1 mg/dL

## 2013-05-13 NOTE — Patient Instructions (Signed)
Your physician recommends that you return for lab work today--BMET/Lipid profile/TSH/CBCd  Your physician has requested that you have an echocardiogram. Echocardiography is a painless test that uses sound waves to create images of your heart. It provides your doctor with information about the size and shape of your heart and how well your heart's chambers and valves are working. This procedure takes approximately one hour. There are no restrictions for this procedure.  Your physician wants you to follow-up in: 1 year with Dr Aundra Dubin. (February 2016). You will receive a reminder letter in the mail two months in advance. If you don't receive a letter, please call our office to schedule the follow-up appointment.

## 2013-05-14 DIAGNOSIS — R5383 Other fatigue: Secondary | ICD-10-CM | POA: Insufficient documentation

## 2013-05-14 NOTE — Progress Notes (Signed)
Patient ID: [name], female   DOB: Jun 14, 1940, 73 y.o.   MRN: [id number] PCP: Dr. Linna Darner  73 yo with history of CAD s/p inferior MI in 1999 and left heart cath in 4/07 with 70% proximal CFX stenosis but normal myoview presents for cardiology followup.  No chest pain.  She is mildly short of breath when walking up a hill.  She can walk up a flight of steps with no problem.  Mild dyspnea after walking about 150 yards if she hurries.  No orthopnea or PND.  Since last appointment with me, she had a sigmoid colectomy for h/o diverticulitis. This was complicated by post-op delirium.  Main complaint is fatigue, lack of energy.   ECG: NSR, nonspecific T wave flattening  Labs (3/12): TSH normal, free T3 low, free T4 normal, LDL 90 Labs (2/13): TGs 255, HDL 43, LDL 84 Labs (11/13): K 3.4, creatinine 0.55  PMH: 1. CAD: Inferior MI in 1999 treated with PTCA.  Left heart cath in 4/07 showed 70% proximal CFX stenosis with EF 60%.  Myoview was done post-cath and showed no ischemia or infarction.   2. Echo (11/11): EF 60-65%, moderate pulmonic insufficiency. Echo (11/12) with EF 55-60%, normal RV, mild PI.   3. Hyperlipidemia 4. GERD 5. Fibromyalgia 6. C-spine stenosis 7. Migraines 8. IBS 9. Recurrent diverticulitis: sigmoid diverticulitis 11/13.  10. Essential tremor   Allergies  Allergen Reactions  . Amphetamine-Dextroamphet Er Other (See Comments)    unknowb reaction  . Metronidazole     REACTION: SORES IN MOUTH, severe diarrhea  . Penicillins     RASH  . Atorvastatin Other (See Comments)    Upset stomach  . Azithromycin     Unknown reaction  . Cephalexin     Unknown reaction  . Chlorpromazine Hcl     Unknown reaction  . Ciprofloxacin     REACTION: SEVERE PAIN,DEHYDRATION,DIARRHEA  . Doxycycline Diarrhea  . Ezetimibe     REACTION: JOINT/MUSCLE ACHES  . Hyoscyamine Sulfate     Unknown reaction  . Lactose Intolerance (Gi) Diarrhea  . Lidocaine Swelling  . Oatmeal Other (See  Comments)     Sinus Congestion   . Sulfamethoxazole     REACTION: GI upset-acid reflux  . Telithromycin     Unknown reaction  . Topiramate     Unknown reaction  . Latex Rash   SH: Married, husband has lung cancer.  3 children.  Never smoked.  Lives in [place].   FH: Father with MI.   ROS: All systems reviewed and negative except as per HPI.   Current Outpatient Prescriptions  Medication Sig Dispense Refill  . acetaminophen (TYLENOL) 650 MG CR tablet Take 1,300 mg by mouth 2 (two) times daily.      Marland Kitchen alendronate (FOSAMAX) 70 MG tablet TAKE 1 TAB ONCE A WK AT LEAST 30 MIN BEFORE 1ST FOOD.DO NOT LIE DOWN FOR 30 MIN AFTER TAKING.  4 tablet  5  . aspirin EC 81 MG tablet Take 81 mg by mouth every morning.       Marland Kitchen BIOTIN PO Take by mouth.      Marland Kitchen buPROPion (WELLBUTRIN XL) 150 MG 24 hr tablet Take 150 mg by mouth daily.      . calcium-vitamin D (OSCAL WITH D) 250-125 MG-UNIT per tablet Take 1 tablet by mouth daily.      . Cholecalciferol (VITAMIN D-3 PO) Take by mouth.      . clonazePAM (KLONOPIN) 0.5 MG tablet Take 0.5 mg by  mouth 3 (three) times daily.       . cloNIDine (CATAPRES) 0.1 MG tablet Take 0.1-0.2 mg by mouth 2 (two) times daily. 1 BY MOUTH IN THE AM, 2 BY MOUTH IN THE PM      . Coenzyme Q10 (COQ10) 100 MG CAPS Take 1 capsule by mouth daily.       . CRESTOR 20 MG tablet TAKE ONE TABLET AT BEDTIME.  90 tablet  0  . donepezil (ARICEPT) 10 MG tablet Take 10 mg by mouth every morning.      Marland Kitchen Fexofenadine HCl (ALLEGRA PO) Take by mouth.      . Ginkgo Biloba 40 MG TABS Take by mouth.      . Melatonin 5 MG TABS Take 1 tablet by mouth at bedtime.       . mometasone (NASONEX) 50 MCG/ACT nasal spray Place 2 sprays into the nose daily.      . Multiple Vitamins-Minerals (CENTRUM SILVER PO) Take by mouth.      Marland Kitchen NEXIUM 40 MG capsule TAKE (1) CAPSULE DAILY.  30 capsule  5  . propranolol (INDERAL) 10 MG tablet Take 10 mg by mouth 3 (three) times daily.       . Psyllium (METAMUCIL PO)  Take by mouth. 4 by mouth daily      . QUEtiapine Fumarate (SEROQUEL XR) 150 MG 24 hr tablet Take 150 mg by mouth at bedtime.      . Ranitidine HCl (ZANTAC PO) Take by mouth.      . Simethicone (GAS-X PO) Take by mouth.      . Vitamin E (E200 PO) Take by mouth.       No current facility-administered medications for this visit.    BP 122/60  Pulse 57  Ht 5' (1.524 m)  Wt 66.679 kg (147 lb)  BMI 28.71 kg/m2 General: NAD Neck: No JVD, no thyromegaly or thyroid nodule.  Lungs: Clear to auscultation bilaterally with normal respiratory effort. CV: Nondisplaced PMI.  Heart regular S1/S2, no S3/S4, no murmur.  No peripheral edema.  No carotid bruit.  Normal pedal pulses.  Abdomen: Soft, nontender, no hepatosplenomegaly, no distention.  Neurologic: Alert and oriented x 3.  Psych: Normal affect. Extremities: No clubbing or cyanosis.   Assessment/Plan: 1. CAD: Stable with no ischemic symptoms.  She is tolerating ASA 81 mg daily without GI intolerance. Continue statin and beta blocker.  2. Hyperlipidemia: Check lipids with goal LDL < 70.  3. Fatigue: Generalized.  I will check CBC and TSH.  I will also get an echo to make sure that LV and RV function remain normal.   [name] 05/14/2013

## 2013-05-19 ENCOUNTER — Encounter: Payer: Self-pay | Admitting: Internal Medicine

## 2013-05-19 ENCOUNTER — Ambulatory Visit (INDEPENDENT_AMBULATORY_CARE_PROVIDER_SITE_OTHER): Payer: BC Managed Care – PPO | Admitting: Internal Medicine

## 2013-05-19 VITALS — BP 103/60 | HR 98 | Temp 97.9°F | Wt 148.0 lb

## 2013-05-19 DIAGNOSIS — J309 Allergic rhinitis, unspecified: Secondary | ICD-10-CM

## 2013-05-19 MED ORDER — AZELASTINE HCL 0.1 % NA SOLN: 2.0000 | Freq: Two times a day (BID) | NASAL | Status: DC

## 2013-05-19 NOTE — Patient Instructions (Signed)
In addition to Nasacort and Allegra start using Astelin twice a day. If you're not improving within the next 2 weeks please let me know, you will need to see the ENT doctor.

## 2013-05-19 NOTE — Progress Notes (Signed)
Pre visit review using our clinic review tool, if applicable. No additional management support is needed unless otherwise documented below in the visit note. 

## 2013-05-19 NOTE — Progress Notes (Signed)
   Subjective:    Patient ID: [name], female    DOB: 1940/06/06, 73 y.o.   MRN: [id number]  DOS:  05/19/2013 Acute visit, we discussed the following issues   Approximately 8 months history of runny nose, from either side of the nose, discharge is watery; no worse  when she eats. She reports a history of allergies before but they were very different, usually had sinus congestion and a thick discharge. Has taken OTCs antihistaminics without much help, including Claritin-D. Currently taking Nasacort which did help a little.  Past Medical History  Diagnosis Date  . Abnormal weight gain   . Unspecified adverse effect of unspecified drug, medicinal and biological substance   . Migraine, unspecified, without mention of intractable migraine without mention of status migrainosus   . Abdominal bruit   . Allergic rhinitis, cause unspecified   . Diverticular disease     acute diverticulitis 10/13/11  . CAD (coronary artery disease)     diaphragmatic wall infarction in 1999, treated with balloon angioplasty with nonobstructive disease at ast catheterization in 2007 as described above  . Hyperlipemia     with low HDL.  . IBS (irritable bowel syndrome)   . GERD (gastroesophageal reflux disease)   . Fibromyalgia   . Tremor   . Depression   . Colon polyp 12/20/2011    Tubular adenoma    Past Surgical History  Procedure Laterality Date  . Abdominal surgery       @ [facility]  in [place] in 2002  for ptosis of organs; mersilene mesh sling, cystocopy  by Dr  Rosana Hoes, Urologist  . Colonoscopy  2011    Diverticulosis & hemorrhoids; Dr Olevia Perches  . [place]      RECTOCOELE  . Bladder surgery  patient unsure of date    sling  . Cataract extraction      left eye  . Partial hysterectomy    . Tonsillectomy  as child  . Abdominal hysterectomy  1979    partial  . Laparoscopic sigmoid colectomy  02/26/2012    Procedure: LAPAROSCOPIC SIGMOID COLECTOMY;  Surgeon: Adin Hector, MD;  Location: [facility];   Service: General;  Laterality: N/A;  Laparoscopic Assisted Sigmoid Colectomy  . Partial colectomy  02/26/2012    Procedure: PARTIAL COLECTOMY;  Surgeon: Adin Hector, MD;  Location: [facility];  Service: General;  Laterality: N/A;  Sigmoid Colectomy     ROS  Denies fever or chills No headaches, sinus pressure or pain. Admits to sneezing, watery and itchy eyes. Denies any head injury over the last year    Objective:   Physical Exam BP 103/60  Pulse 98  Temp(Src) 97.9 F (36.6 C)  Wt 148 lb (67.132 kg)  SpO2 99% General -- alert, well-developed, NAD.   HEENT-- Not pale. TMs normal, throat symmetric, no redness or discharge. Face symmetric, sinuses not tender to palpation. Nose slt congested.  Lungs -- normal respiratory effort, no intercostal retractions, no accessory muscle use, and normal breath sounds.  Heart-- normal rate, regular rhythm, no murmur.   Psych-- Cognition and judgment appear intact. Cooperative with normal attention span and concentration. No anxious or depressed appearing.     Assessment & Plan:

## 2013-05-19 NOTE — Assessment & Plan Note (Addendum)
Several months history of watery nasal discharge, different from her previous allergies although she admits to also having watery eyes. She is taking Allegra and Nasacort. Plan: Add Astelin.. If not improving soon she will need further eval by ENT, again symptoms are somehow unusual and I will ask ENT to see her if she's not improving (CSF leak?)

## 2013-05-20 ENCOUNTER — Encounter: Payer: Self-pay | Admitting: Cardiology

## 2013-05-20 ENCOUNTER — Ambulatory Visit (HOSPITAL_COMMUNITY): Payer: BC Managed Care – PPO | Attending: Cardiology | Admitting: Cardiology

## 2013-05-20 ENCOUNTER — Encounter: Payer: Self-pay | Admitting: Internal Medicine

## 2013-05-20 DIAGNOSIS — I059 Rheumatic mitral valve disease, unspecified: Secondary | ICD-10-CM | POA: Insufficient documentation

## 2013-05-20 DIAGNOSIS — I2510 Atherosclerotic heart disease of native coronary artery without angina pectoris: Secondary | ICD-10-CM

## 2013-05-20 DIAGNOSIS — R5383 Other fatigue: Secondary | ICD-10-CM

## 2013-05-20 DIAGNOSIS — I079 Rheumatic tricuspid valve disease, unspecified: Secondary | ICD-10-CM | POA: Insufficient documentation

## 2013-05-20 DIAGNOSIS — I359 Nonrheumatic aortic valve disorder, unspecified: Secondary | ICD-10-CM | POA: Insufficient documentation

## 2013-05-20 DIAGNOSIS — E785 Hyperlipidemia, unspecified: Secondary | ICD-10-CM

## 2013-05-20 NOTE — Progress Notes (Signed)
Echo performed. 

## 2013-05-28 ENCOUNTER — Other Ambulatory Visit: Payer: Self-pay | Admitting: *Deleted

## 2013-05-28 MED ORDER — ESOMEPRAZOLE MAGNESIUM 40 MG PO CPDR: DELAYED_RELEASE_CAPSULE | ORAL | Status: DC

## 2013-05-28 NOTE — Telephone Encounter (Signed)
Rx sent to the pharmacy by e-script.//AB/CMA 

## 2013-05-30 ENCOUNTER — Encounter: Payer: Self-pay | Admitting: Internal Medicine

## 2013-06-01 ENCOUNTER — Ambulatory Visit (INDEPENDENT_AMBULATORY_CARE_PROVIDER_SITE_OTHER): Payer: BC Managed Care – PPO | Admitting: Internal Medicine

## 2013-06-01 ENCOUNTER — Encounter: Payer: Self-pay | Admitting: Internal Medicine

## 2013-06-01 ENCOUNTER — Ambulatory Visit: Payer: BC Managed Care – PPO | Admitting: Internal Medicine

## 2013-06-01 VITALS — BP 120/70 | HR 62 | Temp 98.4°F | Wt 149.6 lb

## 2013-06-01 DIAGNOSIS — M899 Disorder of bone, unspecified: Secondary | ICD-10-CM

## 2013-06-01 DIAGNOSIS — M949 Disorder of cartilage, unspecified: Secondary | ICD-10-CM

## 2013-06-01 DIAGNOSIS — K219 Gastro-esophageal reflux disease without esophagitis: Secondary | ICD-10-CM

## 2013-06-01 DIAGNOSIS — M5136 Other intervertebral disc degeneration, lumbar region: Secondary | ICD-10-CM

## 2013-06-01 DIAGNOSIS — M8580 Other specified disorders of bone density and structure, unspecified site: Secondary | ICD-10-CM

## 2013-06-01 DIAGNOSIS — M5137 Other intervertebral disc degeneration, lumbosacral region: Secondary | ICD-10-CM

## 2013-06-01 MED ORDER — ALENDRONATE SODIUM 70 MG PO TABS: 70.0000 mg | ORAL_TABLET | ORAL | Status: DC

## 2013-06-01 NOTE — Progress Notes (Signed)
   Subjective:    Patient ID: [name], female    DOB: 1941/03/15, 73 y.o.   MRN: [id number]  HPI Her bone density results 05/17/13 were compared with the results and  images from 01/ 2014.  In 2014 there had been progressive bone loss at the spine and both hips. Specifically it was maximal 7.4% loss @ one hip.  The most recent study shows improvement in all sites. The lowest T score is -2.0 at the radius; previously it was -2.2.  She is on bisphosphonate 70 mg once weekly. Specifically this is taken every Monday morning. She does not eat, drink or take any medications for least 2 hours. She is also on supplemental calcium and vitamin D. She is unsure of the exact total dosages.  Her last vitamin D level was 54 on 06/02/2012.  She is exercising 4 times per week using the elliptical machine, weight bearing exercises, and exercising under supervision by a personal trainer.    Review of Systems   She has intermittent pain on the right above the iliac crest. This is described as dull to sharp and lasting minutes. It is affected by rotation of the torso. She has a history of degenerative disc disease. This is not associated with any numbness, tingling, weakness in her legs. She has no incontinence of urine or stool.  She continues to have dyspepsia despite taking Nexium 40 mg each evening. She denies any dysphagia. She also denies melena or rectal bleeding.     Objective:   Physical Exam  She appears healthy and well-nourished. She appears younger than her stated age. There is mild central weight excess.  She has no lymphadenopathy about the neck, axilla.  There is excellent range of motion of the cervical spine.  She has degenerative joint changes of the fingers at the DIP joints  She has excellent range of motion at all joints with straight leg raising beyond 90 bilaterally  No crepitus or effusion is noted at the knees.  There is no back pain with straight leg  raising  Abdominal exam reveals no masses organomegaly. She has mild epigastric tenderness  Strength, tone, deep tendon reflexes are normal  Skin reveals no suspicious lesions or rashes.        Assessment & Plan:  #1 significant osteopenia; improvement at all sites after one year with bisphosphonates, calcium and  vitamin D supplementation, and weightbearing exercises  #2 degenerative disc disease lumbar spine  #3 significant reflux  See recommendations

## 2013-06-01 NOTE — Progress Notes (Signed)
Pre visit review using our clinic review tool, if applicable. No additional management support is needed unless otherwise documented below in the visit note. 

## 2013-06-01 NOTE — Patient Instructions (Addendum)
Reflux of gastric acid may be asymptomatic as this may occur mainly during sleep.The triggers for reflux  include stress; the "aspirin family" ; alcohol; peppermint; and caffeine (coffee, tea, cola, and chocolate). The aspirin family would include aspirin and the nonsteroidal agents such as ibuprofen &  Naproxen. Tylenol would not cause reflux. If having symptoms ; food & drink should be avoided for @ least 2 hours before going to bed. Please take the Nexium at least 30 minutes prior to breakfast. It can be taken 2 hrs after Fosamax. Please determine the total dose of calcium and vitamin D you're taking.

## 2013-06-04 ENCOUNTER — Other Ambulatory Visit: Payer: Self-pay | Admitting: Cardiology

## 2013-06-05 ENCOUNTER — Encounter: Payer: Self-pay | Admitting: Internal Medicine

## 2013-06-07 ENCOUNTER — Encounter: Payer: Self-pay | Admitting: Internal Medicine

## 2013-06-07 ENCOUNTER — Other Ambulatory Visit: Payer: Self-pay

## 2013-06-07 MED ORDER — ROSUVASTATIN CALCIUM 20 MG PO TABS: ORAL_TABLET | ORAL | Status: DC

## 2013-06-10 ENCOUNTER — Encounter: Payer: Self-pay | Admitting: Internal Medicine

## 2013-06-21 ENCOUNTER — Encounter: Payer: Self-pay | Admitting: Internal Medicine

## 2013-06-28 ENCOUNTER — Other Ambulatory Visit: Payer: Self-pay | Admitting: Cardiology

## 2013-11-29 ENCOUNTER — Other Ambulatory Visit: Payer: Self-pay | Admitting: Internal Medicine

## 2013-12-27 ENCOUNTER — Other Ambulatory Visit: Payer: Self-pay | Admitting: Cardiology

## 2014-01-14 ENCOUNTER — Ambulatory Visit (INDEPENDENT_AMBULATORY_CARE_PROVIDER_SITE_OTHER): Payer: BC Managed Care – PPO | Admitting: Internal Medicine

## 2014-01-14 DIAGNOSIS — F411 Generalized anxiety disorder: Secondary | ICD-10-CM

## 2014-01-14 NOTE — Patient Instructions (Addendum)
   Please have [name] and [name] involve when you discuss my recommendations. Again focus on the love and care you have for him and the emotional pain you are all feeling.  Appropriate testing will be performed to rule out any reversible components for these issues.

## 2014-01-14 NOTE — Progress Notes (Signed)
Pre visit review using our clinic review tool, if applicable. No additional management support is needed unless otherwise documented below in the visit note. 

## 2014-01-14 NOTE — Progress Notes (Signed)
   Subjective:    Patient ID: [name], female    DOB: Feb 13, 1941, 73 y.o.   MRN: [id number]  HPI   She's under  a great deal of stress as her husband is exhibiting signs of confusion.  She states that he forgets content of business phone calls or even family conversations.  He is unable to remember simple steps of pet care when they keep their son's dogs.  This has impacted his business decisions and his son has had to " bail him out" on several occasions  He does have a history of TIAs  and has severe peripheral vascular disease.  He is taking Prozac which has resulted in decreased irritability and decreased depression. His mood has  definitely improved. She states he had previously expressed "death as preferable" but denies any active suicidal ideation or plan.  He has quit smoking for at least a year. He rarely drinks.except @ social occasions  His father was a severe alcoholic. His mother had mini strokes.  She is seeing a psychiatrist Dr. Clovis Walls and is on Aricept, bupropion, Seroquel, and clonazepam. Her husband's issues are causing major emotional stress for her and the 2 adult children.    Review of Systems      Objective:   Physical Exam   No physical  exam conducted.  She is oriented x3, communicative, appropriate and focused     Assessment & Plan:  #1 anxiety related to her husband's mental status changes  Plan: I've recommended that she and the adult children collectively meet with [name] and express their love and care for him and the pain they are feeling observing this process. I've asked her to let him think about this 5-7 days and pray over this for 24 hrs. I would anticipate that with this rather than a nagging and blaming approach he will take an appointment to evaluate other factors such as B12 deficiency, RPR, and thyroid function. At that time I would do an MMSE. She was reassured by this recommendation

## 2014-01-14 NOTE — Progress Notes (Signed)
[name] is not here for a personal appointment for herself. She is here to discuss symptoms with her husband.

## 2014-01-21 ENCOUNTER — Other Ambulatory Visit: Payer: Self-pay

## 2014-02-01 ENCOUNTER — Other Ambulatory Visit: Payer: Self-pay | Admitting: Physical Medicine and Rehabilitation

## 2014-02-01 DIAGNOSIS — R9389 Abnormal findings on diagnostic imaging of other specified body structures: Secondary | ICD-10-CM

## 2014-02-15 ENCOUNTER — Ambulatory Visit
Admission: RE | Admit: 2014-02-15 | Discharge: 2014-02-15 | Disposition: A | Discharge status: Home / Self Care | Payer: BC Managed Care – PPO | Source: Ambulatory Visit | Attending: Physical Medicine and Rehabilitation | Admitting: Physical Medicine and Rehabilitation

## 2014-02-15 DIAGNOSIS — R9389 Abnormal findings on diagnostic imaging of other specified body structures: Secondary | ICD-10-CM

## 2014-02-28 ENCOUNTER — Other Ambulatory Visit: Payer: Self-pay | Admitting: Cardiology

## 2014-03-30 ENCOUNTER — Telehealth: Payer: Self-pay | Admitting: Cardiology

## 2014-03-30 NOTE — Telephone Encounter (Signed)
Patient states this was a 4 lead EKG, and had a disclaimer on it that it is not comparable to a 12 lead. She is feeling tired and stressed, but no lightheadedness or dizziness. She will bring this in for our review. Has a regular scheduled appointment in February.

## 2014-03-30 NOTE — Telephone Encounter (Signed)
New message  Pt called. Just had life line screening. Her results have come back on her heart rate being 41 beats per minute and that she should speak with her Dr. To discuss having an EKG..  Please call back to discuss//sr

## 2014-03-30 NOTE — Telephone Encounter (Signed)
Walk-In Patient form received on 12.23.15: taken to triage on 12.23.15:djc

## 2014-04-05 NOTE — Telephone Encounter (Signed)
Patient  Informed that her life-watch paperwork is in dr. Claris Gladden box and has not been reviewed. Advised he is out of the office this week, and will review it next week. She voiced good understanding.

## 2014-04-05 NOTE — Telephone Encounter (Signed)
Follow up       Pt said she brought by a life screening before Christmas.  She has not heard back from [place] regarding someone looking over it.  Please call

## 2014-04-18 ENCOUNTER — Ambulatory Visit: Payer: Self-pay | Admitting: Cardiology

## 2014-04-18 ENCOUNTER — Ambulatory Visit (INDEPENDENT_AMBULATORY_CARE_PROVIDER_SITE_OTHER): Payer: BLUE CROSS/BLUE SHIELD | Admitting: Cardiology

## 2014-04-18 ENCOUNTER — Encounter: Payer: Self-pay | Admitting: Cardiology

## 2014-04-18 VITALS — BP 124/82 | HR 50 | Ht 60.5 in | Wt 148.0 lb

## 2014-04-18 DIAGNOSIS — R0609 Other forms of dyspnea: Secondary | ICD-10-CM

## 2014-04-18 DIAGNOSIS — R0600 Dyspnea, unspecified: Secondary | ICD-10-CM | POA: Insufficient documentation

## 2014-04-18 DIAGNOSIS — Z9861 Coronary angioplasty status: Secondary | ICD-10-CM

## 2014-04-18 DIAGNOSIS — I2510 Atherosclerotic heart disease of native coronary artery without angina pectoris: Secondary | ICD-10-CM

## 2014-04-18 DIAGNOSIS — E785 Hyperlipidemia, unspecified: Secondary | ICD-10-CM

## 2014-04-18 DIAGNOSIS — G250 Essential tremor: Secondary | ICD-10-CM

## 2014-04-18 DIAGNOSIS — I10 Essential (primary) hypertension: Secondary | ICD-10-CM

## 2014-04-18 DIAGNOSIS — R251 Tremor, unspecified: Secondary | ICD-10-CM

## 2014-04-18 DIAGNOSIS — G252 Other specified forms of tremor: Secondary | ICD-10-CM

## 2014-04-18 DIAGNOSIS — R001 Bradycardia, unspecified: Secondary | ICD-10-CM

## 2014-04-18 LAB — COMPREHENSIVE METABOLIC PANEL
ALT: 14 U/L (ref 0–35)
AST: 20 U/L (ref 0–37)
Albumin: 4.3 g/dL (ref 3.5–5.2)
Alkaline Phosphatase: 54 U/L (ref 39–117)
BUN: 11 mg/dL (ref 6–23)
CO2: 28 mEq/L (ref 19–32)
Calcium: 8.9 mg/dL (ref 8.4–10.5)
Chloride: 104 mEq/L (ref 96–112)
Creatinine, Ser: 0.7 mg/dL (ref 0.4–1.2)
GFR: 81.68 mL/min (ref >60.00)
Glucose, Bld: 104 mg/dL — ABNORMAL HIGH (ref 70–99)
Potassium: 3.5 mEq/L (ref 3.5–5.1)
Sodium: 139 mEq/L (ref 135–145)
Total Bilirubin: 0.5 mg/dL (ref 0.2–1.2)
Total Protein: 7.3 g/dL (ref 6.0–8.3)

## 2014-04-18 LAB — CBC
HCT: 39.9 % (ref 36.0–46.0)
Hemoglobin: 13.1 g/dL (ref 12.0–15.0)
MCHC: 32.8 g/dL (ref 30.0–36.0)
MCV: 101.9 fl — ABNORMAL HIGH (ref 78.0–100.0)
Platelets: 191 10*3/uL (ref 150.0–400.0)
RBC: 3.91 Mil/uL (ref 3.87–5.11)
RDW: 13.9 % (ref 11.5–15.5)
WBC: 6.8 10*3/uL (ref 4.0–10.5)

## 2014-04-18 LAB — LIPID PANEL
Cholesterol: 145 mg/dL (ref 0–200)
HDL: 34.9 mg/dL — ABNORMAL LOW (ref >39.00)
LDL Cholesterol: 71 mg/dL (ref 0–99)
NonHDL: 110.1
Total CHOL/HDL Ratio: 4
Triglycerides: 197 mg/dL — ABNORMAL HIGH (ref 0.0–149.0)
VLDL: 39.4 mg/dL (ref 0.0–40.0)

## 2014-04-18 LAB — TSH: TSH: 2.71 u[IU]/mL (ref 0.35–4.50)

## 2014-04-18 NOTE — Assessment & Plan Note (Signed)
MI PTCA in '99. Cath '07- 70% CFX.

## 2014-04-18 NOTE — Assessment & Plan Note (Signed)
Her HR was noted to be "abnormal" on a recent "Life scan" screening- HR 50.

## 2014-04-18 NOTE — Assessment & Plan Note (Signed)
Pt has noted increasing DOE for the past year.

## 2014-04-18 NOTE — Assessment & Plan Note (Signed)
She is due for Lipids and LFTs and is fasting today

## 2014-04-18 NOTE — Patient Instructions (Signed)
  Your physician has recommended you make the following change in your medication:    START TAKING INDERAL TWICE A DAY    LABS TODAY CBC CMET LPID AND TSH     FOLLOW UP WITH DR MCLEAN IN 4 TO 6 WEEKS

## 2014-04-18 NOTE — Assessment & Plan Note (Signed)
Controlled.  

## 2014-04-18 NOTE — Progress Notes (Signed)
04/18/2014 [name]   07/27/1940  [id number]  Primary Physician Janet Cobble, MD Primary Cardiologist: Janet [name]  HPI:  74 y/o female followed by Janet [name] and Janet [name]. She has a history of CAD, s/p remote PTCA in 1999 by Janet [name]. She was cathed in 2007- 70% CFX disease. She says she has reaction to Nuclear stress testing and can't tolerate this. She has multiple other medical issues including, HTN, dyslipidemia, tremor, IBS, DJD, and anxiety. She has multiple drug allergies and intolerances.           She goes to a "life scan" screening every year. When she went this year she was told her HR was abnormal- HR 50. She is seen nin the office now for further evaluation. She tells me she has been on Inderal 10 mg TID for > 5 yrs for her tremor. She admits she has noticed increased DOE over the past year. She denies chest pain. She has not had syncope or near syncope.    Current Outpatient Prescriptions  Medication Sig Dispense Refill  . acetaminophen (TYLENOL) 650 MG CR tablet Take 1,300 mg by mouth 2 (two) times daily.    Marland Kitchen alendronate (FOSAMAX) 70 MG tablet Take 1 tablet (70 mg total) by mouth once a week. Take with a full glass of water on an empty stomach. 4 tablet 11  . aspirin EC 81 MG tablet Take 81 mg by mouth every morning.     Marland Kitchen BIOTIN PO Take by mouth.    Marland Kitchen buPROPion (WELLBUTRIN XL) 150 MG 24 hr tablet Take 150 mg by mouth daily.    . calcium carbonate (TUMS - DOSED IN MG ELEMENTAL CALCIUM) 500 MG chewable tablet Chew 1 tablet by mouth 2 (two) times daily.    . Cholecalciferol (VITAMIN D-3 PO) Take by mouth 3 (three) times a week.     . clonazePAM (KLONOPIN) 0.5 MG tablet Take 0.5 mg by mouth 3 (three) times daily.     . cloNIDine (CATAPRES) 0.1 MG tablet Take 0.1-0.2 mg by mouth 2 (two) times daily. 1 BY MOUTH IN THE AM, 2 BY MOUTH IN THE PM    . Coenzyme Q10 (COQ10) 100 MG CAPS Take 1 capsule by mouth daily.     . CRESTOR 20 MG tablet TAKE ONE TABLET AT BEDTIME. 30  tablet 3  . donepezil (ARICEPT) 10 MG tablet Take 10 mg by mouth every morning.    Marland Kitchen Fexofenadine HCl (ALLEGRA PO) Take by mouth.    . Ginkgo Biloba 40 MG TABS Take by mouth.    . Melatonin 5 MG TABS Take 1 tablet by mouth at bedtime.     . Multiple Vitamins-Minerals (CENTRUM SILVER PO) Take by mouth.    Marland Kitchen NEXIUM 40 MG capsule TAKE (1) CAPSULE DAILY. 30 capsule 5  . propranolol (INDERAL) 10 MG tablet Take 10 mg by mouth 2 (two) times daily.     . Psyllium (METAMUCIL PO) Take by mouth. 4 by mouth daily    . QUEtiapine Fumarate (SEROQUEL XR) 150 MG 24 hr tablet Take 150 mg by mouth at bedtime.    . Ranitidine HCl (ZANTAC PO) Take by mouth.    . Simethicone (GAS-X PO) Take by mouth.    . Vitamin E (E200 PO) Take by mouth.     No current facility-administered medications for this visit.    Allergies  Allergen Reactions  . Amphetamine-Dextroamphet Er Other (See Comments)    unknowb reaction  . Metronidazole  REACTION: SORES IN MOUTH, severe diarrhea  . Penicillins     RASH  . Atorvastatin Other (See Comments)    Upset stomach  . Azithromycin     Unknown reaction  . Cephalexin     Unknown reaction  . Chlorpromazine Hcl     Unknown reaction  . Ciprofloxacin     REACTION: SEVERE PAIN,DEHYDRATION,DIARRHEA  . Doxycycline Diarrhea  . Ezetimibe     REACTION: JOINT/MUSCLE ACHES  . Hyoscyamine Sulfate     Unknown reaction  . Lactose Intolerance (Gi) Diarrhea  . Lidocaine Swelling  . Oatmeal Other (See Comments)     Sinus Congestion   . Sulfamethoxazole     REACTION: GI upset-acid reflux  . Telithromycin     Unknown reaction  . Topiramate     Unknown reaction  . Latex Rash    History   Social History  . Marital Status: Married    Spouse Name: N/A    Number of Children: N/A  . Years of Education: N/A   Occupational History  . Retired Pharmacist, hospital    Social History Main Topics  . Smoking status: Never Smoker   . Smokeless tobacco: Never Used     Comment: second hand  smoke for decades  . Alcohol Use: No  . Drug Use: No  . Sexual Activity: Not on file   Other Topics Concern  . Not on file   Social History Narrative   High fiber diet           Review of Systems: General: negative for chills, fever, night sweats or weight changes.  Cardiovascular: negative for chest pain, dyspnea on exertion, edema, orthopnea, palpitations, paroxysmal nocturnal dyspnea or shortness of breath Dermatological: negative for rash Respiratory: negative for cough or wheezing Urologic: negative for hematuria Abdominal: negative for nausea, vomiting, diarrhea, bright red blood per rectum, melena, or hematemesis Neurologic: negative for visual changes, syncope, or dizziness All other systems reviewed and are otherwise negative except as noted above.    Blood pressure 124/82, pulse 50, height 5' 0.5" (1.537 m), weight 148 lb (67.132 kg).  General appearance: alert, cooperative and no distress Neck: no carotid bruit and no JVD Lungs: clear to auscultation bilaterally Heart: regular rate and rhythm Extremities: no edema  EKG NSR SB-50  ASSESSMENT AND PLAN:   DOE (dyspnea on exertion) Pt has noted increasing DOE for the past year.  Bradycardia Her HR was noted to be "abnormal" on a recent "Life scan" screening- HR 50.  CAD S/P percutaneous coronary angioplasty MI PTCA in '99. Cath '07- 70% CFX.  TREMOR, ESSENTIAL She has been on TID Inderal for 5 years for this.  Dyslipidemia She is due for Lipids and LFTs and is fasting today  Hypertension Controlled   PLAN  I asked [name] to decrease her Inderal to BID. I suspect she may have chronotropic incompetence causing her dyspnea with exertion.  I offered to do a Myoview to r/o ischemic dyspnea but she declined secondary to past reaction. She is fasting this am so I ordered labs, including CMET, CBC, Lipids, and TSH. She will f/u with [name] in 3-4 weeks to see if she notices any improvement on the lower  dose if Inderal.   [name] KPA-C 04/18/2014 8:52 AM

## 2014-04-18 NOTE — Assessment & Plan Note (Signed)
She has been on TID Inderal for 5 years for this.

## 2014-05-03 ENCOUNTER — Other Ambulatory Visit: Payer: Self-pay | Admitting: Internal Medicine

## 2014-05-03 ENCOUNTER — Telehealth: Payer: Self-pay | Admitting: *Deleted

## 2014-05-03 DIAGNOSIS — M8580 Other specified disorders of bone density and structure, unspecified site: Secondary | ICD-10-CM

## 2014-05-03 MED ORDER — ALENDRONATE SODIUM 70 MG PO TABS: ORAL_TABLET | ORAL | Status: DC

## 2014-05-03 NOTE — Telephone Encounter (Signed)
Needing referral sent over to [facility] for her bone density, also need refill sent on her fosamax. Fosamax sent to pharmacy...Johny Chess

## 2014-05-03 NOTE — Telephone Encounter (Signed)
Pt has been notified order has been enter for bone density...[name]

## 2014-05-03 NOTE — Telephone Encounter (Signed)
Order entered

## 2014-05-19 ENCOUNTER — Telehealth: Payer: Self-pay | Admitting: Internal Medicine

## 2014-05-19 ENCOUNTER — Other Ambulatory Visit: Payer: Self-pay | Admitting: Internal Medicine

## 2014-05-19 NOTE — Telephone Encounter (Signed)
rx has been faxed to [phone]

## 2014-05-19 NOTE — Telephone Encounter (Signed)
Phone call to patient to advise of your response. She states she thought you told her to have it done yearly. She states she already had it done and needs a script for insurance purposes.

## 2014-05-19 NOTE — Telephone Encounter (Signed)
Pt called and said that she needs script for her DEXA sent to [facility] for ins reasons  Fax number [phone]

## 2014-05-19 NOTE — Telephone Encounter (Signed)
Done 05/17/13; not due until 2/17(every 25 mos)

## 2014-05-19 NOTE — Telephone Encounter (Signed)
See Rx 

## 2014-05-23 ENCOUNTER — Other Ambulatory Visit: Payer: Self-pay | Admitting: Internal Medicine

## 2014-05-27 ENCOUNTER — Encounter: Payer: Self-pay | Admitting: Cardiology

## 2014-05-27 ENCOUNTER — Ambulatory Visit (INDEPENDENT_AMBULATORY_CARE_PROVIDER_SITE_OTHER): Payer: BLUE CROSS/BLUE SHIELD | Admitting: Cardiology

## 2014-05-27 VITALS — BP 142/80 | HR 76 | Ht 60.5 in | Wt 144.4 lb

## 2014-05-27 DIAGNOSIS — I2510 Atherosclerotic heart disease of native coronary artery without angina pectoris: Secondary | ICD-10-CM

## 2014-05-27 DIAGNOSIS — I10 Essential (primary) hypertension: Secondary | ICD-10-CM

## 2014-05-27 DIAGNOSIS — Z9861 Coronary angioplasty status: Secondary | ICD-10-CM

## 2014-05-27 NOTE — Patient Instructions (Addendum)
Your physician wants you to follow-up in: 6 months with Dr Aundra Dubin. (August 2016).  You will receive a reminder letter in the mail two months in advance. If you don't receive a letter, please call our office to schedule the follow-up appointment.

## 2014-05-29 NOTE — Progress Notes (Signed)
Patient ID: [name], female   DOB: 09-20-1940, 74 y.o.   MRN: [id number] PCP: Dr. Linna Walls  74 yo with history of CAD s/p inferior MI in 1999 and left heart cath in 4/07 with 70% proximal CFX stenosis but normal myoview presents for cardiology followup.  No chest pain.  She was having a lot of dyspnea a month or two ago and saw the PA here in the office.  She was noted to be bradycardic, and the bradycardia was suspected to play a role in her dyspnea by possibly causing chronotropic incompetence.  She is now off propranolol.  She is less short of breath but tremor is getting worse.  Currently, she is mildly short of breath when walking up a hill.  She can walk up a flight of steps with no problem.  Mild dyspnea after walking about 150 yards if she hurries.  No orthopnea or PND.    ECG: NSR, nonspecific T wave flattening  Labs (3/12): TSH normal, free T3 low, free T4 normal, LDL 90 Labs (2/13): TGs 255, HDL 43, LDL 84 Labs (11/13): K 3.4, creatinine 0.55 Labs (1/16): K 3.5, creatinine 0.7, LDL 71, HDL 35, TSH normal, HCT 39.9  PMH: 1. CAD: Inferior MI in 1999 treated with PTCA.  Left heart cath in 4/07 showed 70% proximal CFX stenosis with EF 60%.  Myoview was done post-cath and showed no ischemia or infarction.   2. Echo (11/11): EF 60-65%, moderate pulmonic insufficiency. Echo (11/12) with EF 55-60%, normal RV, mild PI.  Echo (2/15) with EF 55-60%, mild LVH, mild MR, mild AI.  3. Hyperlipidemia 4. GERD 5. Fibromyalgia 6. C-spine stenosis 7. Migraines 8. IBS 9. Recurrent diverticulitis: sigmoid diverticulitis 11/13.  10. Essential tremor 11. Bradycardia   Allergies  Allergen Reactions  . Amphetamine-Dextroamphet Er Other (See Comments)    unknowb reaction  . Metronidazole     REACTION: SORES IN MOUTH, severe diarrhea  . Penicillins     RASH  . Atorvastatin Other (See Comments)    Upset stomach  . Azithromycin     Unknown reaction  . Cephalexin     Unknown reaction  .  Chlorpromazine Hcl     Unknown reaction  . Ciprofloxacin     REACTION: SEVERE PAIN,DEHYDRATION,DIARRHEA  . Doxycycline Diarrhea  . Ezetimibe     REACTION: JOINT/MUSCLE ACHES  . Hyoscyamine Sulfate     Unknown reaction  . Lactose Intolerance (Gi) Diarrhea  . Lidocaine Swelling  . Oatmeal Other (See Comments)     Sinus Congestion   . Sulfamethoxazole     REACTION: GI upset-acid reflux  . Telithromycin     Unknown reaction  . Topiramate     Unknown reaction  . Latex Rash   SH: Married, husband has lung cancer.  3 children.  Never smoked.  Lives in [place].   FH: Father with MI.   ROS: All systems reviewed and negative except as per HPI.   Current Outpatient Prescriptions  Medication Sig Dispense Refill  . acetaminophen (TYLENOL) 650 MG CR tablet Take 1,300 mg by mouth 2 (two) times daily.    Marland Kitchen alendronate (FOSAMAX) 70 MG tablet TAKE 1 TAB ONCE A WK AT LEAST 30 MIN BEFORE 1ST FOOD.DO NOT LIE DOWN FOR 30 MIN AFTER TAKING. 4 tablet 5  . aspirin EC 81 MG tablet Take 81 mg by mouth every morning.     Marland Kitchen BIOTIN PO Take by mouth.    Marland Kitchen buPROPion (WELLBUTRIN XL) 150 MG 24 hr  tablet Take 150 mg by mouth daily.    . calcium carbonate (TUMS - DOSED IN MG ELEMENTAL CALCIUM) 500 MG chewable tablet Chew 1 tablet by mouth 2 (two) times daily.    . Cholecalciferol (VITAMIN D-3 PO) Take by mouth 3 (three) times a week.     . clonazePAM (KLONOPIN) 0.5 MG tablet Take 0.5 mg by mouth 3 (three) times daily.     . cloNIDine (CATAPRES) 0.1 MG tablet Take 0.1-0.2 mg by mouth 2 (two) times daily. 1 BY MOUTH IN THE AM, 2 BY MOUTH IN THE PM    . Coenzyme Q10 (COQ10) 100 MG CAPS Take 1 capsule by mouth daily.     . CRESTOR 20 MG tablet TAKE ONE TABLET AT BEDTIME. 30 tablet 3  . donepezil (ARICEPT) 10 MG tablet Take 10 mg by mouth every morning.    . Ginkgo Biloba 40 MG TABS Take by mouth.    . Lactase (LACTAID PO) Take 1 tablet by mouth as needed (Lactose Intolerance).    . Loratadine-Pseudoephedrine  (CLARITIN-D 24 HOUR PO) Take 1 tablet by mouth daily.    . Melatonin 5 MG TABS Take 1 tablet by mouth at bedtime.     . Multiple Vitamins-Minerals (CENTRUM SILVER PO) Take by mouth.    Marland Kitchen NEXIUM 40 MG capsule TAKE (1) CAPSULE DAILY. 30 capsule 5  . Psyllium (METAMUCIL PO) Take by mouth. 4 by mouth daily    . QUEtiapine Fumarate (SEROQUEL XR) 150 MG 24 hr tablet Take 150 mg by mouth at bedtime.    . Simethicone (GAS-X PO) Take by mouth.    . Vitamin E (E200 PO) Take by mouth.     No current facility-administered medications for this visit.    BP 142/80 mmHg  Pulse 76  Ht 5' 0.5" (1.537 m)  Wt 144 lb 6.4 oz (65.499 kg)  BMI 27.73 kg/m2 General: NAD Neck: No JVD, no thyromegaly or thyroid nodule.  Lungs: Clear to auscultation bilaterally with normal respiratory effort. CV: Nondisplaced PMI.  Heart regular S1/S2, no S3/S4, no murmur.  No peripheral edema.  No carotid bruit.  Normal pedal pulses.  Abdomen: Soft, nontender, no hepatosplenomegaly, no distention.  Neurologic: Alert and oriented x 3.  Psych: Normal affect. Extremities: No clubbing or cyanosis.   Assessment/Plan: 1. CAD: Stable with no ischemic symptoms.  She is tolerating ASA 81 mg daily without GI intolerance. Continue statin and beta blocker.  2. Hyperlipidemia: Good lipids 1/16.  3. Bradycardia: Resolved off propranolol.  Less short of breath now, suspect chronotropic incompetence was part of the problem.  Tremor is worse, but she will be seeing her neurologist soon.    [name] 05/29/2014

## 2014-05-31 ENCOUNTER — Encounter: Payer: Self-pay | Admitting: Internal Medicine

## 2014-06-06 ENCOUNTER — Other Ambulatory Visit: Payer: Self-pay

## 2014-06-06 MED ORDER — ROSUVASTATIN CALCIUM 20 MG PO TABS: ORAL_TABLET | ORAL | Status: DC

## 2014-06-07 ENCOUNTER — Encounter: Payer: Self-pay | Admitting: Internal Medicine

## 2014-06-22 ENCOUNTER — Telehealth: Payer: Self-pay | Admitting: Cardiology

## 2014-06-22 NOTE — Telephone Encounter (Signed)
New message  Dr. Clovis Pu called to speak with Dr. Aundra Dubin. He says that Dr. Aundra Dubin gave the pt's some suggestions for tremor treatments. Dr. Clovis Pu is requesting a call back to discuss the suggestions personally because the pt has forgotten what Dr. Aundra Dubin has recommended. Please call

## 2014-06-23 NOTE — Telephone Encounter (Signed)
I think that primidone would be an option but I was going to let her decide that with her neurologist. She is off beta blockers due to bradycardia.

## 2014-06-23 NOTE — Telephone Encounter (Signed)
I spoke with Dr Clovis Pu and gave him this information from Dr Aundra Dubin.

## 2014-07-07 ENCOUNTER — Encounter: Payer: Self-pay | Admitting: Internal Medicine

## 2014-10-03 ENCOUNTER — Other Ambulatory Visit: Payer: Self-pay

## 2014-10-05 ENCOUNTER — Ambulatory Visit: Payer: BLUE CROSS/BLUE SHIELD | Admitting: Internal Medicine

## 2014-10-07 ENCOUNTER — Encounter: Payer: Self-pay | Admitting: Internal Medicine

## 2014-10-13 ENCOUNTER — Other Ambulatory Visit (INDEPENDENT_AMBULATORY_CARE_PROVIDER_SITE_OTHER): Payer: BLUE CROSS/BLUE SHIELD

## 2014-10-13 ENCOUNTER — Ambulatory Visit (INDEPENDENT_AMBULATORY_CARE_PROVIDER_SITE_OTHER): Payer: BLUE CROSS/BLUE SHIELD | Admitting: Internal Medicine

## 2014-10-13 ENCOUNTER — Encounter: Payer: Self-pay | Admitting: Internal Medicine

## 2014-10-13 VITALS — BP 130/72 | HR 58 | Temp 98.0°F | Resp 16 | Wt 132.0 lb

## 2014-10-13 DIAGNOSIS — R233 Spontaneous ecchymoses: Secondary | ICD-10-CM

## 2014-10-13 DIAGNOSIS — E785 Hyperlipidemia, unspecified: Secondary | ICD-10-CM

## 2014-10-13 DIAGNOSIS — R238 Other skin changes: Secondary | ICD-10-CM | POA: Diagnosis not present

## 2014-10-13 DIAGNOSIS — M810 Age-related osteoporosis without current pathological fracture: Secondary | ICD-10-CM

## 2014-10-13 LAB — CBC WITH DIFFERENTIAL/PLATELET
BASOS ABS: 0 10*3/uL (ref 0.0–0.1)
Basophils Relative: 0.5 % (ref 0.0–3.0)
EOS ABS: 0.1 10*3/uL (ref 0.0–0.7)
Eosinophils Relative: 1.9 % (ref 0.0–5.0)
HCT: 37.9 % (ref 36.0–46.0)
HEMOGLOBIN: 12.6 g/dL (ref 12.0–15.0)
LYMPHS PCT: 27.3 % (ref 12.0–46.0)
Lymphs Abs: 2.1 10*3/uL (ref 0.7–4.0)
MCHC: 33.2 g/dL (ref 30.0–36.0)
MCV: 100.8 fl — ABNORMAL HIGH (ref 78.0–100.0)
Monocytes Absolute: 0.8 10*3/uL (ref 0.1–1.0)
Monocytes Relative: 10.6 % (ref 3.0–12.0)
Neutro Abs: 4.7 10*3/uL (ref 1.4–7.7)
Neutrophils Relative %: 59.7 % (ref 43.0–77.0)
PLATELETS: 200 10*3/uL (ref 150.0–400.0)
RBC: 3.76 Mil/uL — ABNORMAL LOW (ref 3.87–5.11)
RDW: 14.1 % (ref 11.5–15.5)
WBC: 7.8 10*3/uL (ref 4.0–10.5)

## 2014-10-13 LAB — APTT: aPTT: 33.2 s — ABNORMAL HIGH (ref 23.4–32.7)

## 2014-10-13 LAB — PROTIME-INR
INR: 1 ratio (ref 0.8–1.0)
PROTHROMBIN TIME: 11.5 s (ref 9.6–13.1)

## 2014-10-13 MED ORDER — ALENDRONATE SODIUM 70 MG PO TABS: ORAL_TABLET | ORAL | Status: DC

## 2014-10-13 NOTE — Progress Notes (Signed)
Pre visit review using our clinic review tool, if applicable. No additional management support is needed unless otherwise documented below in the visit note. 

## 2014-10-13 NOTE — Progress Notes (Signed)
   Subjective:    Patient ID: [name], female    DOB: 05/16/1940, 74 y.o.   MRN: [id number]  HPI She has had bruising w/o trigger or injury over the last several months. She is on no anticoagulants ; but she takes Aleve 2 in am and 1 in evening as well as a baby ASA. She has reviewed all of her supplements with her Pharmacist and none are associated with bruising. Specifically she is not on a garlic supplement.She is questioning taking vitamin K to treat the easy bruising. She has no other bleeding dyscrasias. The labs done 07/02/14  through her husband's office were reviewed. Her LDL was 56 ,goal is less than 70 because of her CAD HDL was mildly reduced at 46. Triglycerides were minimally elevated at 156. Her A1c was 5.7. She thought this ment she was prediabetic and wanted the labs repeated.. She is on a bisphosphonate vand wants annual BMD study.   Review of Systems Epistaxis, hemoptysis, hematuria, melena, or rectal bleeding denied. No unexplained weight loss, significant dyspepsia,dysphagia, or abdominal pain.  There is no abnormal bleeding or difficulty stopping bleeding with injury.      Objective:   Physical Exam  General appearance: Thin but well nourished; w/o distress.  Eyes: No conjunctival inflammation or scleral icterus is present.  Oral exam: Dental hygiene is good; lips and gums are healthy appearing.There is no oropharyngeal erythema or exudate noted.   Heart:  Normal rate and regular rhythm. S1 and S2 normal without gallop, murmur, click, rub or other extra sounds     Lungs:Chest clear to auscultation; no wheezes, rhonchi,rales ,or rubs present.No increased work of breathing.   Abdomen: bowel sounds normal, soft and non-tender without masses, organomegaly or hernias noted.  No guarding or rebound .   Musculoskeletal: Able to lie flat and sit up without help. Negative straight leg raising bilaterally. Gait normal  Skin:Warm & dry.  Intact without suspicious  lesions or rashes ; no jaundice or tenting. No significant ecchymosis present.  Lymphatic: No lymphadenopathy is noted about the head, neck, axilla            Assessment & Plan:  #1 easy bruising; this is most likely related to taking 3 Aleve and one baby aspirin a day. I did explain that the Aleve would negate the prophylactic benefit of the baby aspirin. #2 dyslipidemia; excellent control. Repeat labs not indicated #3 no DM risk #4 Osteoporosis; in 3rd year of bisphosphonate.BMD every 25 months as per [facility] appropriate  Plan: See orders

## 2014-10-13 NOTE — Patient Instructions (Signed)
A1c assesses average 24 hour  glucose over prior 6-12 weeks.  No Diabetes risk if < 6.1%  "Pre Diabetes" :6.2-6.4 % Good diabetic control: 6.5-7 % Fair diabetic control: 7-8 % Poor diabetic control: greater than 8 % ( except with additional factors such as  advanced age; significant coronary or neurologic disease,etc).  Your present value is 5.7 %. An  A1c of 7 % or less  is the safest goal for you.Preferred is < 6.5 % as long as there are no low blood glucose events.

## 2014-11-05 ENCOUNTER — Other Ambulatory Visit: Payer: Self-pay | Admitting: Internal Medicine

## 2014-11-08 ENCOUNTER — Other Ambulatory Visit: Payer: Self-pay | Admitting: Internal Medicine

## 2014-11-08 ENCOUNTER — Other Ambulatory Visit: Payer: Self-pay | Admitting: Emergency Medicine

## 2014-11-08 MED ORDER — ESOMEPRAZOLE MAGNESIUM 40 MG PO CPDR: DELAYED_RELEASE_CAPSULE | ORAL | Status: DC

## 2015-04-07 ENCOUNTER — Ambulatory Visit (INDEPENDENT_AMBULATORY_CARE_PROVIDER_SITE_OTHER): Payer: BLUE CROSS/BLUE SHIELD | Admitting: Internal Medicine

## 2015-04-07 ENCOUNTER — Encounter: Payer: Self-pay | Admitting: Internal Medicine

## 2015-04-07 VITALS — BP 144/80 | HR 61 | Temp 98.1°F | Ht 60.5 in | Wt 137.0 lb

## 2015-04-07 DIAGNOSIS — K5730 Diverticulosis of large intestine without perforation or abscess without bleeding: Secondary | ICD-10-CM

## 2015-04-07 DIAGNOSIS — K5732 Diverticulitis of large intestine without perforation or abscess without bleeding: Secondary | ICD-10-CM

## 2015-04-07 DIAGNOSIS — R197 Diarrhea, unspecified: Secondary | ICD-10-CM

## 2015-04-07 NOTE — Patient Instructions (Signed)
   Please take a probiotic , Florastor OR Align, every day if the bowels are loose. This will replace the normal bacteria which  are necessary for formation of normal stool and processing of food.  Immodium AD as needed for frank diarrhea or watery stool   Your next office appointment will be determined based upon review of your pending labs  and  xrays  Those written interpretation of the lab results and instructions will be transmitted to you by mail for your records.  Critical results will be called.   Followup as needed for any active or acute issue. Please report any significant change in your symptoms.

## 2015-04-07 NOTE — Progress Notes (Signed)
Pre visit review using our clinic review tool, if applicable. No additional management support is needed unless otherwise documented below in the visit note. 

## 2015-04-07 NOTE — Progress Notes (Signed)
   Subjective:    Patient ID: [name], female    DOB: Jun 09, 1940, 74 y.o.   MRN: [id number]  HPI She describes change in her bowels over the last 6-9 months varying from loose to watery stool. There've been no infectious exposures or foods/travel triggers. She feels this is related to stress. She has not treated this per se.  Tubular adenoma was found @ colonoscopy done 12/2011.Localized diverticulitis was found 02/2012.  Review of Systems Unexplained weight loss, abdominal pain, significant dyspepsia, dysphagia, melena, rectal bleeding, or persistently small caliber stools are denied.    Objective:   Physical Exam  Pertinent or positive findings include: There is some tenting present. She has minor DIP arthritic changes in the hands. Posterior tibial pulses are decreased.  General appearance :adequately nourished; in no distress.  Eyes: No conjunctival inflammation or scleral icterus is present.  Oral exam:  Lips and gums are healthy appearing.There is no oropharyngeal erythema or exudate noted. Dental hygiene is good.  Heart:  Normal rate and regular rhythm. S1 and S2 normal without gallop, murmur, click, rub or other extra sounds    Lungs:Chest clear to auscultation; no wheezes, rhonchi,rales ,or rubs present.No increased work of breathing.   Abdomen: bowel sounds normal, soft and non-tender without masses, organomegaly or hernias noted.  No guarding or rebound.   Vascular : all pulses equal ; no bruits present.  Skin:Warm & dry.  Intact without suspicious lesions or rashes ; no  jaundice   Lymphatic: No lymphadenopathy is noted about the head, neck, axilla.   Neuro: Strength, tone & DTRs normal.      Assessment & Plan:  #1 bowel changes of loose-watery stool; probable irritable bowel syndrome variant. PMH of tubular adenoma and diverticulitis.  See orders and recommendations

## 2015-04-11 ENCOUNTER — Other Ambulatory Visit (INDEPENDENT_AMBULATORY_CARE_PROVIDER_SITE_OTHER): Payer: BLUE CROSS/BLUE SHIELD

## 2015-04-11 DIAGNOSIS — R197 Diarrhea, unspecified: Secondary | ICD-10-CM

## 2015-04-11 LAB — BASIC METABOLIC PANEL
BUN: 9 mg/dL (ref 6–23)
CALCIUM: 9.2 mg/dL (ref 8.4–10.5)
CHLORIDE: 104 meq/L (ref 96–112)
CO2: 29 meq/L (ref 19–32)
Creatinine, Ser: 0.78 mg/dL (ref 0.40–1.20)
GFR: 76.66 mL/min (ref >60.00)
Glucose, Bld: 76 mg/dL (ref 70–99)
POTASSIUM: 4.1 meq/L (ref 3.5–5.1)
SODIUM: 139 meq/L (ref 135–145)

## 2015-04-11 LAB — CBC WITH DIFFERENTIAL/PLATELET
Basophils Absolute: 0.1 10*3/uL (ref 0.0–0.1)
Basophils Relative: 0.8 % (ref 0.0–3.0)
Eosinophils Absolute: 0.2 10*3/uL (ref 0.0–0.7)
Eosinophils Relative: 2.8 % (ref 0.0–5.0)
HCT: 38 % (ref 36.0–46.0)
Hemoglobin: 12.7 g/dL (ref 12.0–15.0)
LYMPHS ABS: 2 10*3/uL (ref 0.7–4.0)
LYMPHS PCT: 31.3 % (ref 12.0–46.0)
MCHC: 33.3 g/dL (ref 30.0–36.0)
MCV: 101.7 fl — AB (ref 78.0–100.0)
MONO ABS: 0.9 10*3/uL (ref 0.1–1.0)
MONOS PCT: 14.2 % — AB (ref 3.0–12.0)
NEUTROS ABS: 3.3 10*3/uL (ref 1.4–7.7)
NEUTROS PCT: 50.9 % (ref 43.0–77.0)
PLATELETS: 196 10*3/uL (ref 150.0–400.0)
RBC: 3.74 Mil/uL — ABNORMAL LOW (ref 3.87–5.11)
RDW: 12.9 % (ref 11.5–15.5)
WBC: 6.5 10*3/uL (ref 4.0–10.5)

## 2015-04-11 LAB — TSH: TSH: 1.9 u[IU]/mL (ref 0.35–4.50)

## 2015-04-13 ENCOUNTER — Telehealth: Payer: Self-pay | Admitting: Internal Medicine

## 2015-04-13 DIAGNOSIS — D7589 Other specified diseases of blood and blood-forming organs: Secondary | ICD-10-CM

## 2015-04-13 NOTE — Telephone Encounter (Signed)
Please advise 

## 2015-04-13 NOTE — Telephone Encounter (Signed)
Pt aware of results 

## 2015-04-13 NOTE — Telephone Encounter (Signed)
Pt called in and would like someone to call her with her results    Best number [phone]

## 2015-04-13 NOTE — Telephone Encounter (Signed)
Please inform patient that her kidney function, electrolytes, and thyroid function are within the normal limits. Her red blood cells are slightly enlarged indicated a potential for B12 or folate deficiency. Therefore I have placed labs for her to complete that will check B12/Folate. No fasting is needed and can be done at any time.

## 2015-04-14 ENCOUNTER — Other Ambulatory Visit (INDEPENDENT_AMBULATORY_CARE_PROVIDER_SITE_OTHER): Payer: BLUE CROSS/BLUE SHIELD

## 2015-04-14 DIAGNOSIS — D7589 Other specified diseases of blood and blood-forming organs: Secondary | ICD-10-CM

## 2015-04-14 LAB — FOLATE: Folate: >23.8 ng/mL (ref >5.9)

## 2015-04-17 LAB — METHYLMALONIC ACID, SERUM: METHYLMALONIC ACID, QUANT: 141 nmol/L (ref 87–318)

## 2015-04-18 ENCOUNTER — Other Ambulatory Visit: Payer: Self-pay | Admitting: Internal Medicine

## 2015-04-19 ENCOUNTER — Telehealth: Payer: Self-pay | Admitting: *Deleted

## 2015-04-19 NOTE — Telephone Encounter (Signed)
Error... forwarding to [place]...[name]

## 2015-04-19 NOTE — Telephone Encounter (Signed)
Left msg on triage requesting lab results done on 04/14/15...Johny Chess

## 2015-04-19 NOTE — Telephone Encounter (Signed)
Notified pt with Greg response.../lmb 

## 2015-04-19 NOTE — Telephone Encounter (Signed)
I just received the results this morning and her blood work is normal with no additional testing at this time as long as she remains asymptomatic.

## 2015-04-27 ENCOUNTER — Telehealth: Payer: Self-pay | Admitting: Internal Medicine

## 2015-04-27 NOTE — Telephone Encounter (Signed)
Pt request refill for alendronate (FOSAMAX) 70 MG tablet to be send to [facility]. Pt also stated she need written rx for bone density to be send to [facility], pt state insurance will pay for if she can get the rx to be send in for her. Please help

## 2015-04-28 MED ORDER — ALENDRONATE SODIUM 70 MG PO TABS: ORAL_TABLET | ORAL | Status: DC

## 2015-04-28 NOTE — Telephone Encounter (Signed)
Rx sent to pharm to last until OV on 06/02/15. Please advise on Bone density.

## 2015-04-28 NOTE — Telephone Encounter (Signed)
rx written - let me know if I need to order in chart

## 2015-05-02 NOTE — Telephone Encounter (Signed)
RX for dexa has been faxed to [facility]

## 2015-05-25 LAB — HM DEXA SCAN

## 2015-05-25 LAB — HM MAMMOGRAPHY: HM MAMMO: NEGATIVE

## 2015-05-27 ENCOUNTER — Encounter: Payer: Self-pay | Admitting: Internal Medicine

## 2015-05-31 ENCOUNTER — Telehealth: Payer: Self-pay | Admitting: Emergency Medicine

## 2015-05-31 DIAGNOSIS — Z0000 Encounter for general adult medical examination without abnormal findings: Secondary | ICD-10-CM

## 2015-05-31 DIAGNOSIS — E785 Hyperlipidemia, unspecified: Secondary | ICD-10-CM

## 2015-05-31 NOTE — Telephone Encounter (Signed)
LVM informing pt

## 2015-05-31 NOTE — Telephone Encounter (Signed)
Pt came into office last week asking to have paper work filled out for insurance comp. Stated that she needs labs and would like to get them done before her visit. She is needing a CMP, Lipid Panel, HgbA1c. Are you okay with pt coming in before visit on Friday to get labs drawn.

## 2015-05-31 NOTE — Telephone Encounter (Signed)
yes

## 2015-06-02 ENCOUNTER — Ambulatory Visit (INDEPENDENT_AMBULATORY_CARE_PROVIDER_SITE_OTHER): Payer: BLUE CROSS/BLUE SHIELD | Admitting: Internal Medicine

## 2015-06-02 ENCOUNTER — Other Ambulatory Visit (INDEPENDENT_AMBULATORY_CARE_PROVIDER_SITE_OTHER): Payer: BLUE CROSS/BLUE SHIELD

## 2015-06-02 ENCOUNTER — Encounter: Payer: Self-pay | Admitting: Internal Medicine

## 2015-06-02 VITALS — BP 122/72 | HR 69 | Temp 97.9°F | Resp 16 | Ht 60.75 in | Wt 130.0 lb

## 2015-06-02 DIAGNOSIS — E785 Hyperlipidemia, unspecified: Secondary | ICD-10-CM | POA: Diagnosis not present

## 2015-06-02 DIAGNOSIS — I10 Essential (primary) hypertension: Secondary | ICD-10-CM

## 2015-06-02 DIAGNOSIS — M810 Age-related osteoporosis without current pathological fracture: Secondary | ICD-10-CM

## 2015-06-02 DIAGNOSIS — I2510 Atherosclerotic heart disease of native coronary artery without angina pectoris: Secondary | ICD-10-CM

## 2015-06-02 DIAGNOSIS — K219 Gastro-esophageal reflux disease without esophagitis: Secondary | ICD-10-CM

## 2015-06-02 DIAGNOSIS — Z9861 Coronary angioplasty status: Secondary | ICD-10-CM

## 2015-06-02 DIAGNOSIS — Z0000 Encounter for general adult medical examination without abnormal findings: Secondary | ICD-10-CM | POA: Diagnosis not present

## 2015-06-02 DIAGNOSIS — K432 Incisional hernia without obstruction or gangrene: Secondary | ICD-10-CM

## 2015-06-02 LAB — LIPID PANEL
CHOLESTEROL: 151 mg/dL (ref 0–200)
HDL: 47.8 mg/dL (ref >39.00)
LDL CALC: 78 mg/dL (ref 0–99)
NonHDL: 102.99
TRIGLYCERIDES: 124 mg/dL (ref 0.0–149.0)
Total CHOL/HDL Ratio: 3
VLDL: 24.8 mg/dL (ref 0.0–40.0)

## 2015-06-02 LAB — COMPREHENSIVE METABOLIC PANEL
ALK PHOS: 50 U/L (ref 39–117)
ALT: 15 U/L (ref 0–35)
AST: 24 U/L (ref 0–37)
Albumin: 4.6 g/dL (ref 3.5–5.2)
BUN: 13 mg/dL (ref 6–23)
CALCIUM: 9.5 mg/dL (ref 8.4–10.5)
CO2: 27 meq/L (ref 19–32)
Chloride: 99 mEq/L (ref 96–112)
Creatinine, Ser: 0.83 mg/dL (ref 0.40–1.20)
GFR: 71.33 mL/min (ref >60.00)
GLUCOSE: 96 mg/dL (ref 70–99)
Potassium: 3.9 mEq/L (ref 3.5–5.1)
SODIUM: 133 meq/L — AB (ref 135–145)
Total Bilirubin: 0.6 mg/dL (ref 0.2–1.2)
Total Protein: 7.5 g/dL (ref 6.0–8.3)

## 2015-06-02 LAB — HEMOGLOBIN A1C: HEMOGLOBIN A1C: 5.5 % (ref 4.6–6.5)

## 2015-06-02 NOTE — Patient Instructions (Addendum)
  We have reviewed your prior records including labs and tests today.   Medications reviewed and updated.  No changes recommended at this time.  Try to come off the nexium.

## 2015-06-02 NOTE — Assessment & Plan Note (Signed)
Lipid panel controlled Taking crestor 20 mg - self decreased to three times a week Wants to d/c crestor - advised to continue with it at least three times a week

## 2015-06-02 NOTE — Progress Notes (Signed)
Subjective:    Patient ID: [name], female    DOB: 1940/06/04, 75 y.o.   MRN: [id number]  HPI She is here to establish with a new pcp and here for follow up today.   Hyperlipidemia: She is taking her medication daily. She wonders if she should continue it.  She is compliant with a low fat/cholesterol diet. She is exercising regularly. She denies myalgias.   Osteoporosis:  She is taking fosamax and has been on it for three years.  She has osteoporosis in the left wrist and osteopenia elsewhere.  She does exercise 4 times a week. She is taking calcium and vitamin D.  She is up to date with her bone density scans and would like to review them.    Abdominal mass:  For the past 6 months she has felt a mass in her lower abdomen.  When she lays down it goes away completely.  She had pelvic sling placed in 2001 and did not have any problems with this since then - she is unsure if it is related to that.  She has had a couple of surgeries in the past.  She does have some discomfort in her lower abdomen, but no pain.  Her bowel movements are more formed and harder to get out.  Sometimes she has diarrhea or soft stool.    CAD, Hypertension: She is taking her medication daily. She is compliant with a low sodium diet.  She denies chest pain, palpitations, edema, shortness of breath and regular headaches. She is exercising regularly.  She does not monitor her blood pressure at home.    GERD:  She is taking her medication daily as prescribed.  She denies any GERD symptoms and feels her GERD is well controlled.    Medications and allergies reviewed with patient and updated if appropriate.  Patient Active Problem List   Diagnosis Date Noted  . DOE (dyspnea on exertion) 04/18/2014  . Bradycardia 04/18/2014  . Fatigue 05/14/2013  . Osteoporosis 05/17/2012  . Acute delirium 02/28/2012  . Hypertension 02/28/2012  . 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 Diverticulitis 10/24/2011  . Pulmonic  insufficiency 08/27/2010  . TREMOR, ESSENTIAL 06/20/2010  . HOT FLASHES 06/20/2010  . CARDIOMEGALY, MILD 02/01/2010  . UNSPECIFIED VITAMIN D DEFICIENCY 01/16/2010  . OTHER SPECIFIED CIRCULATORY SYSTEM DISORDERS 08/14/2009  . HYPERGLYCEMIA, FASTING 05/19/2009  . LIPOMAS, MULTIPLE 09/26/2008  . Anxiety state 07/14/2008  . Dyslipidemia 07/11/2008  . FIBROCYSTIC BREAST DISEASE 06/08/2008  . Irritable bowel syndrome 08/27/2007  . GERD 08/26/2007  . CAD S/P percutaneous coronary angioplasty 07/10/2007  . HEADACHE 07/10/2007  . DEHYDRATION 03/12/2007  . Migraine, unspecified, without mention of intractable migraine without mention of status migrainosus 03/12/2007  . ACUT MI ANTEROLAT WALL SUBSQT EPIS CARE 03/12/2007  . ALLERGIC RHINITIS 03/12/2007  . ABDOMINAL BRUIT 03/12/2007  . UNS ADVRS EFF UNS RX MEDICINAL&BIOLOGICAL SBSTNC 03/12/2007  . Diverticulosis of large intestine 11/25/2006    Current Outpatient Prescriptions on File Prior to Visit  Medication Sig Dispense Refill  . acetaminophen (TYLENOL) 650 MG CR tablet Take 1,300 mg by mouth 2 (two) times daily.    Marland Kitchen alendronate (FOSAMAX) 70 MG tablet TAKE 1 TAB ONCE A WK AT LEAST 30 MIN BEFORE 1ST FOOD.DO NOT LIE DOWN FOR 30 MIN AFTER TAKING. 4 tablet 1  . BIOTIN PO Take by mouth.    Marland Kitchen buPROPion (WELLBUTRIN XL) 150 MG 24 hr tablet Take 150 mg by mouth daily.    . calcium  carbonate (TUMS - DOSED IN MG ELEMENTAL CALCIUM) 500 MG chewable tablet Chew 1 tablet by mouth 2 (two) times daily.    . Cholecalciferol (VITAMIN D-3 PO) Take by mouth 3 (three) times a week.     . clonazePAM (KLONOPIN) 0.5 MG tablet Take 0.5 mg by mouth 2 (two) times daily.     . cloNIDine (CATAPRES) 0.1 MG tablet Take 0.1-0.2 mg by mouth 2 (two) times daily. 1 BY MOUTH IN THE AM, 1 BY MOUTH IN THE PM    . Coenzyme Q10 (COQ10) 100 MG CAPS Take 1 capsule by mouth daily.     Marland Kitchen donepezil (ARICEPT) 10 MG tablet Take 10 mg by mouth every morning.    Marland Kitchen EVENING PRIMROSE OIL PO  Take by mouth.    . Ginkgo Biloba 40 MG TABS Take by mouth.    . Lactase (LACTAID PO) Take 1 tablet by mouth as needed (Lactose Intolerance).    . Loratadine-Pseudoephedrine (CLARITIN-D 24 HOUR PO) Take 1 tablet by mouth daily.    . Melatonin 5 MG TABS Take 1 tablet by mouth at bedtime.     . Multiple Vitamins-Minerals (CENTRUM SILVER PO) Take by mouth.    Marland Kitchen NEXIUM 40 MG capsule TAKE (1) CAPSULE DAILY. 30 capsule 0  . Omega-3 Fatty Acids (FISH OIL PO) Take by mouth.    . Potassium (POTASSIMIN PO) Take by mouth daily.    . Psyllium (METAMUCIL PO) Take by mouth. 4 by mouth daily    . QUEtiapine Fumarate (SEROQUEL XR) 150 MG 24 hr tablet Take 150 mg by mouth at bedtime.    . rosuvastatin (CRESTOR) 20 MG tablet TAKE ONE TABLET AT BEDTIME. (Patient taking differently: TAKE ONE TABLET THREE TIMES A WEEK) 30 tablet 11  . Simethicone (GAS-X PO) Take by mouth.    . Vitamin E (E200 PO) Take by mouth.     No current facility-administered medications on file prior to visit.    Past Medical History  Diagnosis Date  . Abnormal weight gain   . Unspecified adverse effect of unspecified drug, medicinal and biological substance   . Migraine, unspecified, without mention of intractable migraine without mention of status migrainosus   . Abdominal bruit   . Allergic rhinitis, cause unspecified   . Diverticular disease     acute diverticulitis 10/13/11  . CAD (coronary artery disease)     diaphragmatic wall infarction in 1999, treated with balloon angioplasty with nonobstructive disease at ast catheterization in 2007 as described above  . Hyperlipemia     with low HDL.  . IBS (irritable bowel syndrome)   . GERD (gastroesophageal reflux disease)   . Fibromyalgia   . Tremor   . Depression   . Colon polyp 12/20/2011    Tubular adenoma    Past Surgical History  Procedure Laterality Date  . Abdominal surgery       @ [facility]  in [place] in 2002  for ptosis of organs; mersilene mesh sling, cystocopy  by Dr   Rosana Hoes, Urologist  . Colonoscopy  2011    Diverticulosis & hemorrhoids; Dr Olevia Perches  . [place]      RECTOCOELE  . Bladder surgery  patient unsure of date    sling  . Cataract extraction      left eye  . Partial hysterectomy    . Tonsillectomy  as child  . Abdominal hysterectomy  1979    partial  . Laparoscopic sigmoid colectomy  02/26/2012    Procedure: LAPAROSCOPIC SIGMOID COLECTOMY;  Surgeon: Adin Hector, MD;  Location: [facility];  Service: General;  Laterality: N/A;  Laparoscopic Assisted Sigmoid Colectomy  . Partial colectomy  02/26/2012    Procedure: PARTIAL COLECTOMY;  Surgeon: Adin Hector, MD;  Location: [facility];  Service: General;  Laterality: N/A;  Sigmoid Colectomy    Social History   Social History  . Marital Status: Married    Spouse Name: N/A  . Number of Children: N/A  . Years of Education: N/A   Occupational History  . Retired Pharmacist, hospital    Social History Main Topics  . Smoking status: Never Smoker   . Smokeless tobacco: Never Used     Comment: second hand smoke for decades  . Alcohol Use: No  . Drug Use: No  . Sexual Activity: Not Asked   Other Topics Concern  . None   Social History Narrative   High fiber diet          Family History  Problem Relation Age of Onset  . Heart attack Brother 75    1/2 BROTHER  . Bipolar disorder Sister   . Heart attack Sister 75  . Lymphoma Sister   . Heart attack Father 63  . Ulcers Father   . Lung cancer Father   . Stroke Sister     1/2 SISTER  . Alcohol abuse Mother   . Colon cancer Neg Hx   . Other Brother     brain tumor  . Mental illness Son     Review of Systems  Constitutional: Positive for fatigue. Negative for fever.  Respiratory: Negative for cough, shortness of breath and wheezing.   Cardiovascular: Negative for chest pain, palpitations and leg swelling.  Gastrointestinal:       No GERD  Neurological: Negative for dizziness, light-headedness and headaches.       Objective:    Filed Vitals:   06/02/15 1401  BP: 122/72  Pulse: 69  Temp: 97.9 F (36.6 C)  Resp: 16   Filed Weights   06/02/15 1401  Weight: 130 lb (58.968 kg)   Body mass index is 24.77 kg/(m^2).   Physical Exam  Constitutional: Appears well-developed and well-nourished. No distress.  Neck: Neck supple. No tracheal deviation present. No thyromegaly present.  No carotid bruit. No cervical adenopathy.   Cardiovascular: Normal rate, regular rhythm and normal heart sounds.   No murmur heard.  No edema Pulmonary/Chest: Effort normal and breath sounds normal. No respiratory distress. No wheezes.  Abdomen:  Lower abdominal incisions well healed - large hernia when standing, resolves when laying, no tenderness      Assessment & Plan:   See Problem List for Assessment and Plan of chronic medical problems.

## 2015-06-04 DIAGNOSIS — K432 Incisional hernia without obstruction or gangrene: Secondary | ICD-10-CM | POA: Insufficient documentation

## 2015-06-04 HISTORY — DX: Incisional hernia without obstruction or gangrene: K43.2

## 2015-06-04 NOTE — Assessment & Plan Note (Signed)
BP Readings from Last 3 Encounters:  06/02/15 122/72  04/07/15 144/80  10/13/14 130/72   Well controlled Continue current medications Check cmp

## 2015-06-04 NOTE — Assessment & Plan Note (Signed)
Asymptomatic Following with cardiology Stressed importance of staying on crestor - she self reduced dose to three times weeks due to concerns of long term use Check lipid panel, cmp

## 2015-06-04 NOTE — Assessment & Plan Note (Signed)
abdominal mass is a hernia - not incarcerated and minimal discomfort Discussed options  Recommended no surgery at this time and she agrees

## 2015-06-04 NOTE — Assessment & Plan Note (Signed)
dexa scans reviewed in detail with her Severe osteopenia Check calcium and make sure it is calcium citrate Recommended continuing fosamax (been on it for three years) for another 1-2 years so she may not have to go back on it dexa in two years

## 2015-06-04 NOTE — Assessment & Plan Note (Signed)
Controlled, asymptomatic Has been on the medication for years and unsure if she still needs it Recommended trying to taper off slowly If GERD symptoms try zantac 1-2 times daily Call with questions

## 2015-06-05 ENCOUNTER — Encounter: Payer: Self-pay | Admitting: Internal Medicine

## 2015-06-05 ENCOUNTER — Telehealth: Payer: Self-pay | Admitting: Internal Medicine

## 2015-06-05 DIAGNOSIS — R1900 Intra-abdominal and pelvic swelling, mass and lump, unspecified site: Secondary | ICD-10-CM

## 2015-06-05 NOTE — Telephone Encounter (Signed)
Patient called to advise that she has spoken to [facility] and they advised to put in the referral for the abdominal US.

## 2015-06-05 NOTE — Telephone Encounter (Signed)
ordered

## 2015-06-05 NOTE — Telephone Encounter (Signed)
Please advise 

## 2015-06-06 ENCOUNTER — Other Ambulatory Visit: Payer: Self-pay | Admitting: Internal Medicine

## 2015-06-06 DIAGNOSIS — R1900 Intra-abdominal and pelvic swelling, mass and lump, unspecified site: Secondary | ICD-10-CM

## 2015-06-06 NOTE — Telephone Encounter (Signed)
Spoke with pt to inform.  

## 2015-06-08 ENCOUNTER — Encounter: Payer: Self-pay | Admitting: Internal Medicine

## 2015-06-08 DIAGNOSIS — S0500XA Injury of conjunctiva and corneal abrasion without foreign body, unspecified eye, initial encounter: Secondary | ICD-10-CM | POA: Insufficient documentation

## 2015-06-08 MED ORDER — METHYLPREDNISOLONE ACETATE PF 40 MG/ML IJ SUSP: INTRAMUSCULAR | Status: DC

## 2015-06-09 ENCOUNTER — Encounter: Payer: Self-pay | Admitting: Internal Medicine

## 2015-06-09 NOTE — Telephone Encounter (Signed)
Please advise 

## 2015-06-10 NOTE — Telephone Encounter (Signed)
I know we talked about her dexa and maybe her mammo - can't remember.  If she already had it done then it is an insurance issue - ?  She should speak with [employer] to find out details not [place].

## 2015-06-14 ENCOUNTER — Ambulatory Visit
Admission: RE | Admit: 2015-06-14 | Discharge: 2015-06-14 | Disposition: A | Discharge status: Home / Self Care | Payer: BLUE CROSS/BLUE SHIELD | Source: Ambulatory Visit | Attending: Internal Medicine | Admitting: Internal Medicine

## 2015-06-14 ENCOUNTER — Encounter: Payer: Self-pay | Admitting: Internal Medicine

## 2015-06-14 ENCOUNTER — Other Ambulatory Visit: Payer: Self-pay | Admitting: Internal Medicine

## 2015-06-14 ENCOUNTER — Telehealth: Payer: Self-pay | Admitting: Emergency Medicine

## 2015-06-14 ENCOUNTER — Ambulatory Visit: Payer: BLUE CROSS/BLUE SHIELD | Admitting: Physician Assistant

## 2015-06-14 DIAGNOSIS — R1900 Intra-abdominal and pelvic swelling, mass and lump, unspecified site: Secondary | ICD-10-CM

## 2015-06-14 NOTE — Telephone Encounter (Signed)
[facility] called, [place] limited Pelvis order placed.

## 2015-06-18 ENCOUNTER — Other Ambulatory Visit: Payer: Self-pay | Admitting: Cardiology

## 2015-06-19 ENCOUNTER — Telehealth: Payer: Self-pay | Admitting: Internal Medicine

## 2015-06-19 DIAGNOSIS — K458 Other specified abdominal hernia without obstruction or gangrene: Secondary | ICD-10-CM

## 2015-06-19 NOTE — Telephone Encounter (Signed)
She has a large hernia, but given the lack of symptoms she does not need surgery right away.  Surgery is an option but not needed asap.  If she wants to discuss surgery with a surgeon we can refer her

## 2015-06-19 NOTE — Telephone Encounter (Signed)
Please advise 

## 2015-06-19 NOTE — Telephone Encounter (Signed)
Spoke with pt. She is aware that it is not urgent, but would like to go ahead and speak with a surgeon about the options for surgery. She had found two surgeons out of [place] that she already has appts with.  [facility]  & Dr Judeth [facility]- [facility].  She is aware that her insurance may not covering going to see both doctors and is okay with paying out of pocket.

## 2015-06-19 NOTE — Telephone Encounter (Signed)
Patient states she just received results of abdominal ultra sound.  Patient states it looks like she needs surgery as soon as possible.  Is requesting a call in regards.

## 2015-06-19 NOTE — Telephone Encounter (Signed)
Referral ordered

## 2015-06-20 ENCOUNTER — Telehealth: Payer: Self-pay | Admitting: Internal Medicine

## 2015-06-20 NOTE — Telephone Encounter (Signed)
Records faxed to [phone]

## 2015-06-20 NOTE — Telephone Encounter (Signed)
Can you please send office notes to Dr. Joesph Fillers office asap. Her appt is tomorrow morning Their fax # [phone].

## 2015-06-20 NOTE — Telephone Encounter (Signed)
Spoke with pt to inform. Spoke with [name] and a referral isnt needed for [employer].

## 2015-06-23 ENCOUNTER — Telehealth: Payer: Self-pay | Admitting: Cardiology

## 2015-06-23 NOTE — Telephone Encounter (Signed)
Left message with Levada Dy with Dr. Arvin Collard office to call back. Will send message to scheduling to make an appointment for patient.

## 2015-06-23 NOTE — Telephone Encounter (Signed)
New message      Request for surgical clearance:  What type of surgery is being performed? Hernia surgery When is this surgery scheduled?  Pending clearance 1. Are there any medications that need to be held prior to surgery and how long? Cardiac clearance only  Name of physician performing surgery? Dr Arvin Collard 2. What is your office phone and fax number? Fax [phone]

## 2015-06-23 NOTE — Telephone Encounter (Signed)
Should be ok if no new symptoms.  However, has not been seen in office for > 1 year.  Would work in with PA or me for clearance appointment prior to surgery.

## 2015-06-27 ENCOUNTER — Ambulatory Visit (INDEPENDENT_AMBULATORY_CARE_PROVIDER_SITE_OTHER): Payer: BLUE CROSS/BLUE SHIELD | Admitting: Internal Medicine

## 2015-06-27 ENCOUNTER — Encounter: Payer: Self-pay | Admitting: Internal Medicine

## 2015-06-27 VITALS — BP 114/80 | HR 55 | Temp 98.0°F | Resp 16 | Wt 134.0 lb

## 2015-06-27 DIAGNOSIS — I10 Essential (primary) hypertension: Secondary | ICD-10-CM

## 2015-06-27 DIAGNOSIS — I2510 Atherosclerotic heart disease of native coronary artery without angina pectoris: Secondary | ICD-10-CM | POA: Diagnosis not present

## 2015-06-27 DIAGNOSIS — R7309 Other abnormal glucose: Secondary | ICD-10-CM

## 2015-06-27 DIAGNOSIS — K432 Incisional hernia without obstruction or gangrene: Secondary | ICD-10-CM | POA: Diagnosis not present

## 2015-06-27 DIAGNOSIS — Z9861 Coronary angioplasty status: Secondary | ICD-10-CM

## 2015-06-27 NOTE — Assessment & Plan Note (Signed)
Lab Results  Component Value Date   HGBA1C 5.5 06/02/2015   Sugars controlled  - a1c in normal range  Continue healthy diet and regular exercise

## 2015-06-27 NOTE — Assessment & Plan Note (Signed)
BP well controlled Current regimen effective and well tolerated Continue current medications at current doses  

## 2015-06-27 NOTE — Patient Instructions (Addendum)
   Medications reviewed and updated.  No changes recommended at this time.     

## 2015-06-27 NOTE — Assessment & Plan Note (Signed)
Asymptomatic Good exercise tolerance Continue current meds Will be getting cardiac clearance from cardiology

## 2015-06-27 NOTE — Progress Notes (Signed)
Subjective:    Patient ID: [name], female    DOB: 1941-03-20, 75 y.o.   MRN: [id number]  HPI  She is here for surgical clearance.  She is unsure if she needs clearance from me or not.   She has a large lower abdominal hernia and has surgery planned for April 23rd.  Dr Arvin Collard will be doing the surgery.  She will be meeting with anesthesia.  She is having daily discomfort at the site of the hernia.  The hernia is very heavy.  She is having trouble with bowel movements.    She denies sob, cp, palpitations or lightheadedness with exericse.   She denies personal or family history of bleeding or blood clot problems  She has had several surgeries in the past.  She has had several issues with anesthesia in the past. Thorazine causes nausea and led to liver problems.    After her partial hysterectomy when she was in the hospital she had violent nightmares, which was related to anesthesia.  That only lasted a couple of days.   At [facility] in [facility] when she had surgery for ptosis of organs; mersilene mesh sling, cystocopy she was in recovery from surgery and did not remember the first day or two after surgery.  She had prolonged grogginess.     After her last surgery she had confusion that lasted a while, shortness of breath and ended up in the ICU and had to go to rehab after.  She states she had to learn how to walk again and it took her months to recover.   She needs to have surgery, but is very concerned about side effects from the anesthesia and pain medication.   Medications and allergies reviewed with patient and updated if appropriate.  Patient Active Problem List   Diagnosis Date Noted  . Corneal abrasion, left 06/08/2015  . Incisional hernia, without obstruction or gangrene 06/04/2015  . DOE (dyspnea on exertion) 04/18/2014  . Bradycardia 04/18/2014  . Fatigue 05/14/2013  . Osteoporosis 05/17/2012  . Acute delirium 02/28/2012  . Hypertension 02/28/2012  . 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 Diverticulitis 10/24/2011  . Pulmonic insufficiency 08/27/2010  . TREMOR, ESSENTIAL 06/20/2010  . HOT FLASHES 06/20/2010  . CARDIOMEGALY, MILD 02/01/2010  . UNSPECIFIED VITAMIN D DEFICIENCY 01/16/2010  . OTHER SPECIFIED CIRCULATORY SYSTEM DISORDERS 08/14/2009  . HYPERGLYCEMIA, FASTING 05/19/2009  . LIPOMAS, MULTIPLE 09/26/2008  . Anxiety state 07/14/2008  . Dyslipidemia 07/11/2008  . FIBROCYSTIC BREAST DISEASE 06/08/2008  . Irritable bowel syndrome 08/27/2007  . GERD 08/26/2007  . CAD S/P percutaneous coronary angioplasty 07/10/2007  . HEADACHE 07/10/2007  . Migraine, unspecified, without mention of intractable migraine without mention of status migrainosus 03/12/2007  . ACUT MI ANTEROLAT WALL SUBSQT EPIS CARE 03/12/2007  . ALLERGIC RHINITIS 03/12/2007  . ABDOMINAL BRUIT 03/12/2007  . UNS ADVRS EFF UNS RX MEDICINAL&BIOLOGICAL SBSTNC 03/12/2007  . Diverticulosis of large intestine 11/25/2006    Current Outpatient Prescriptions on File Prior to Visit  Medication Sig Dispense Refill  . acetaminophen (TYLENOL) 650 MG CR tablet Take 1,300 mg by mouth 2 (two) times daily.    Marland Kitchen alendronate (FOSAMAX) 70 MG tablet TAKE 1 TAB ONCE A WK AT LEAST 30 MIN BEFORE 1ST FOOD.DO NOT LIE DOWN FOR 30 MIN AFTER TAKING. 4 tablet 1  . BIOTIN PO Take by mouth.    Marland Kitchen buPROPion (WELLBUTRIN XL) 150 MG 24 hr tablet Take 150 mg by mouth daily.    . calcium  carbonate (TUMS - DOSED IN MG ELEMENTAL CALCIUM) 500 MG chewable tablet Chew 1 tablet by mouth 2 (two) times daily.    . Cholecalciferol (VITAMIN D-3 PO) Take by mouth 3 (three) times a week.     . clobetasol cream (TEMOVATE) AB-123456789 % Apply 1 application topically once a week.     . clonazePAM (KLONOPIN) 0.5 MG tablet Take 0.5 mg by mouth 2 (two) times daily.     . cloNIDine (CATAPRES) 0.1 MG tablet Take 0.1-0.2 mg by mouth 2 (two) times daily. 1 BY MOUTH IN THE AM, 1 BY MOUTH IN THE PM    . Coenzyme Q10 (COQ10) 100 MG CAPS Take 1 capsule by  mouth daily.     Marland Kitchen donepezil (ARICEPT) 10 MG tablet Take 10 mg by mouth every morning.    Marland Kitchen EVENING PRIMROSE OIL PO Take by mouth.    . Ginkgo Biloba 40 MG TABS Take by mouth.    . IRON PO Take by mouth.    . Lactase (LACTAID PO) Take 1 tablet by mouth as needed (Lactose Intolerance).    . Loratadine-Pseudoephedrine (CLARITIN-D 24 HOUR PO) Take 1 tablet by mouth daily.    . Melatonin 5 MG TABS Take 1 tablet by mouth at bedtime.     . methylPREDNISolone acetate PF (DEPO-MEDROL) 40 MG/ML SUSP injection Every 6 months with Dr. Nelva Bush 1 mL   . Multiple Vitamins-Minerals (CENTRUM SILVER PO) Take by mouth.    Marland Kitchen NEXIUM 40 MG capsule TAKE (1) CAPSULE DAILY. 30 capsule 0  . Omega-3 Fatty Acids (FISH OIL PO) Take by mouth.    . Potassium (POTASSIMIN PO) Take by mouth daily.    . Psyllium (METAMUCIL PO) Take by mouth. 4 by mouth daily    . QUEtiapine Fumarate (SEROQUEL XR) 150 MG 24 hr tablet Take 150 mg by mouth at bedtime.    . rosuvastatin (CRESTOR) 20 MG tablet TAKE ONE TABLET AT BEDTIME. 30 tablet 1  . Simethicone (GAS-X PO) Take by mouth.    . Vitamin E (E200 PO) Take by mouth.     No current facility-administered medications on file prior to visit.    Past Medical History  Diagnosis Date  . Abnormal weight gain   . Unspecified adverse effect of unspecified drug, medicinal and biological substance   . Migraine, unspecified, without mention of intractable migraine without mention of status migrainosus   . Abdominal bruit   . Allergic rhinitis, cause unspecified   . Diverticular disease     acute diverticulitis 10/13/11  . CAD (coronary artery disease)     diaphragmatic wall infarction in 1999, treated with balloon angioplasty with nonobstructive disease at ast catheterization in 2007 as described above  . Hyperlipemia     with low HDL.  . IBS (irritable bowel syndrome)   . GERD (gastroesophageal reflux disease)   . Fibromyalgia   . Tremor   . Depression   . Colon polyp 12/20/2011     Tubular adenoma    Past Surgical History  Procedure Laterality Date  . Abdominal surgery       @ [facility]  in [place] in 2002  for ptosis of organs; mersilene mesh sling, cystocopy  by Dr  Rosana Hoes, Urologist  . Colonoscopy  2011    Diverticulosis & hemorrhoids; Dr Olevia Perches  . [place]      RECTOCOELE  . Bladder surgery  patient unsure of date    sling  . Cataract extraction      left eye  .  Partial hysterectomy    . Tonsillectomy  as child  . Abdominal hysterectomy  1979    partial  . Laparoscopic sigmoid colectomy  02/26/2012    Procedure: LAPAROSCOPIC SIGMOID COLECTOMY;  Surgeon: Adin Hector, MD;  Location: [facility];  Service: General;  Laterality: N/A;  Laparoscopic Assisted Sigmoid Colectomy  . Partial colectomy  02/26/2012    Procedure: PARTIAL COLECTOMY;  Surgeon: Adin Hector, MD;  Location: [facility];  Service: General;  Laterality: N/A;  Sigmoid Colectomy    Social History   Social History  . Marital Status: Married    Spouse Name: N/A  . Number of Children: N/A  . Years of Education: N/A   Occupational History  . Retired Pharmacist, hospital    Social History Main Topics  . Smoking status: Never Smoker   . Smokeless tobacco: Never Used     Comment: second hand smoke for decades  . Alcohol Use: No  . Drug Use: No  . Sexual Activity: Not on file   Other Topics Concern  . Not on file   Social History Narrative   High fiber diet          Family History  Problem Relation Age of Onset  . Heart attack Brother 50    1/2 BROTHER  . Bipolar disorder Sister   . Heart attack Sister 55  . Lymphoma Sister   . Heart attack Father 31  . Ulcers Father   . Lung cancer Father   . Stroke Sister     1/2 SISTER  . Alcohol abuse Mother   . Colon cancer Neg Hx   . Other Brother     brain tumor  . Mental illness Son     Review of Systems  Constitutional: Positive for unexpected weight change (intentional). Negative for fever, chills and appetite change.  Respiratory: Negative  for cough, shortness of breath and wheezing.   Cardiovascular: Negative for chest pain, palpitations and leg swelling.  Gastrointestinal: Positive for abdominal pain (related to hernia) and constipation (on occasion if does not drink enough or eat properly). Negative for nausea and diarrhea.       Occ gerd - controlled with nexium  Genitourinary: Negative for dysuria and hematuria.  Neurological: Negative for dizziness, weakness, light-headedness, numbness and headaches.       Objective:   Filed Vitals:   06/27/15 0915  BP: 114/80  Pulse: 55  Temp: 98 F (36.7 C)  Resp: 16   Filed Weights   06/27/15 0915  Weight: 134 lb (60.782 kg)   Body mass index is 25.53 kg/(m^2).   Physical Exam Constitutional: Appears well-developed and well-nourished. No distress.  Neck: Neck supple. No tracheal deviation present. No thyromegaly present.  No carotid bruit. No cervical adenopathy.   Cardiovascular: Normal rate, regular rhythm and normal heart sounds.   No murmur heard.  No edema Pulmonary/Chest: Effort normal and breath sounds normal. No respiratory distress. No wheezes.  Psych:  Normal mood and affect     Assessment & Plan:   Surgery for large lower abdominal  incisional wall hernia She is exiperiencing pain and discomfort and is planning to have surgery on 4/23 She will be seeing her cardiologist and they will be giving her cardiac clearance Her medication conditions are stable and she can proceed with surgery.   The biggest concern is her side effects from anesthesia and pain medication - she will discuss this in detail with the anesthesiologist   I agree she needs surgery given  her symptoms and from a medical standpoint is cleared.    See Problem List for Assessment and Plan of chronic medical problems.

## 2015-06-28 NOTE — Telephone Encounter (Signed)
The pt has a pending surgical clearance appointment on 06/30/15 with Kerin Ransom PA.

## 2015-06-30 ENCOUNTER — Encounter: Payer: Self-pay | Admitting: Cardiology

## 2015-06-30 ENCOUNTER — Ambulatory Visit (INDEPENDENT_AMBULATORY_CARE_PROVIDER_SITE_OTHER): Payer: BLUE CROSS/BLUE SHIELD | Admitting: Cardiology

## 2015-06-30 ENCOUNTER — Telehealth: Payer: Self-pay | Admitting: Cardiology

## 2015-06-30 VITALS — BP 98/60 | HR 66 | Ht 61.0 in | Wt 132.3 lb

## 2015-06-30 DIAGNOSIS — Z9861 Coronary angioplasty status: Secondary | ICD-10-CM

## 2015-06-30 DIAGNOSIS — E785 Hyperlipidemia, unspecified: Secondary | ICD-10-CM

## 2015-06-30 DIAGNOSIS — I2510 Atherosclerotic heart disease of native coronary artery without angina pectoris: Secondary | ICD-10-CM | POA: Diagnosis not present

## 2015-06-30 DIAGNOSIS — Z01810 Encounter for preprocedural cardiovascular examination: Secondary | ICD-10-CM | POA: Diagnosis not present

## 2015-06-30 NOTE — Assessment & Plan Note (Signed)
Tolerating Crestor 20 mg- PCP following lipids

## 2015-06-30 NOTE — Progress Notes (Signed)
06/30/2015 [name]   10/02/1940  [id number]  Primary Physician Binnie Rail, MD Primary Cardiologist: Dr Aundra Dubin  HPI:  75 y/o female followed by Dr Aundra Dubin and Dr Linna Darner. She has a history of CAD, s/p remote PTCA in 1999 by Dr Olevia Perches. She was cathed in 2007 and had 70% CFX disease treat red medically. She says she has reaction to Nuclear stress testing and can't tolerate this. An echo done in 2015 showed normal LVF. She has multiple other medical issues including, HTN, dyslipidemia, tremor, IBS, DJD, and anxiety. She has multiple drug allergies and intolerances. She is here today for pre op clearance prior to abdominal hernia repair (Dr Jacalyn Lefevre in [place]).           She is doing well from a cardiac standpoint. She denies any chest pain or unusual dyspnea. She has been working with a Physiological scientist and says she has been able to increase her exercise. She was taken off beta blocker last year secondary to bradycardia. She was on Inderal for tremor which she says has gotten a little worse off Inderal but it really only bothers her when she is under stress. She can't tolerate ASA (GI). Her only cardiac medication at this time is Crestor 20 mg.    Current Outpatient Prescriptions  Medication Sig Dispense Refill  . acetaminophen (TYLENOL) 650 MG CR tablet Take 1,300 mg by mouth 2 (two) times daily.    Marland Kitchen alendronate (FOSAMAX) 70 MG tablet TAKE 1 TAB ONCE A WK AT LEAST 30 MIN BEFORE 1ST FOOD.DO NOT LIE DOWN FOR 30 MIN AFTER TAKING. 4 tablet 1  . BIOTIN PO Take by mouth.    Marland Kitchen buPROPion (WELLBUTRIN XL) 150 MG 24 hr tablet Take 150 mg by mouth daily.    . Cholecalciferol 5000 units TABS Take 1 tablet by mouth daily.    . clobetasol cream (TEMOVATE) [id number] % Apply 1 application topically once a week.     . clonazePAM (KLONOPIN) 0.5 MG tablet Take 0.5 mg by mouth 2 (two) times daily.     . Coenzyme Q10 (COQ10) 100 MG CAPS Take 1 capsule by mouth daily.     Marland Kitchen donepezil (ARICEPT) 10 MG tablet Take 10 mg  by mouth every morning.    Marland Kitchen EVENING PRIMROSE OIL PO Take by mouth.    . Ginkgo Biloba 40 MG TABS Take by mouth.    . IRON PO Take by mouth.    . Lactase (LACTAID PO) Take 1 tablet by mouth as needed (Lactose Intolerance).    . loratadine (CLARITIN) 10 MG tablet Take 10 mg by mouth daily.    . Melatonin 5 MG TABS Take 1 tablet by mouth at bedtime.     . methylPREDNISolone acetate PF (DEPO-MEDROL) 40 MG/ML SUSP injection Every 6 months with Dr. Nelva Bush 1 mL   . Multiple Vitamins-Minerals (CENTRUM SILVER PO) Take by mouth.    . naproxen sodium (RA NAPROXEN SODIUM) 220 MG tablet Take 220 mg by mouth 3 (three) times daily.    Marland Kitchen NEXIUM 40 MG capsule TAKE (1) CAPSULE DAILY. 30 capsule 0  . Potassium (POTASSIMIN PO) Take by mouth daily.    . Psyllium (METAMUCIL PO) Take by mouth. 4 by mouth daily    . QUEtiapine Fumarate (SEROQUEL XR) 150 MG 24 hr tablet Take 150 mg by mouth at bedtime.    . rosuvastatin (CRESTOR) 20 MG tablet TAKE ONE TABLET AT BEDTIME. 30 tablet 1  . Simethicone (GAS-X  PO) Take by mouth.    . Vitamin E (E200 PO) Take by mouth.     No current facility-administered medications for this visit.    Allergies  Allergen Reactions  . Amphetamine-Dextroamphet Er Other (See Comments)    unknowb reaction  . Hydromorphone Shortness Of Breath    BREATHING AND CARDIAC ISSUES  . Metronidazole Other (See Comments)    Unknown REACTION: SORES IN MOUTH, severe diarrhea  . Penicillins     RASH  . Bupivacaine Other (See Comments)    Unknown  . Atorvastatin Other (See Comments)    Upset stomach  . Azithromycin     Unknown reaction  . Cephalexin     Unknown reaction  . Chlorpromazine Hcl     Unknown reaction  . Ciprofloxacin     REACTION: SEVERE PAIN,DEHYDRATION,DIARRHEA  . Doxycycline Diarrhea  . Ezetimibe     REACTION: JOINT/MUSCLE ACHES  . Hyoscyamine Sulfate     Unknown reaction  . Lactose Intolerance (Gi) Diarrhea  . Lidocaine Swelling  . Oatmeal Other (See Comments)      Sinus Congestion   . Sulfamethoxazole     REACTION: GI upset-acid reflux  . Telithromycin     Unknown reaction  . Topiramate     Unknown reaction  . Latex Rash    Social History   Social History  . Marital Status: Married    Spouse Name: N/A  . Number of Children: N/A  . Years of Education: N/A   Occupational History  . Retired Pharmacist, hospital    Social History Main Topics  . Smoking status: Never Smoker   . Smokeless tobacco: Never Used     Comment: second hand smoke for decades  . Alcohol Use: No  . Drug Use: No  . Sexual Activity: Not on file   Other Topics Concern  . Not on file   Social History Narrative   High fiber diet           Review of Systems: General: negative for chills, fever, night sweats or weight changes.  Cardiovascular: negative for chest pain, dyspnea on exertion, edema, orthopnea, palpitations, paroxysmal nocturnal dyspnea or shortness of breath Dermatological: negative for rash Respiratory: negative for cough or wheezing Urologic: negative for hematuria Abdominal: negative for nausea, vomiting, diarrhea, bright red blood per rectum, melena, or hematemesis Neurologic: negative for visual changes, syncope, or dizziness All other systems reviewed and are otherwise negative except as noted above.    Blood pressure 98/60, pulse 66, height 5\' 1"  (1.549 m), weight 132 lb 4.8 oz (60.011 kg).  General appearance: alert, cooperative and no distress Neck: no carotid bruit and no JVD Lungs: clear to auscultation bilaterally Heart: regular rate and rhythm Abdomen: not distended Extremities: extremities normal, atraumatic, no cyanosis or edema Pulses: 2+ and symmetric Skin: Skin color, texture, turgor normal. No rashes or lesions Neurologic: Grossly normal  EKG [id number]  ASSESSMENT AND PLAN:   Pre-operative cardiovascular examination Here for pre op cardiovascular clearance prior to hernia repair  CAD S/P percutaneous coronary angioplasty MI PTCA  in '99.  Cath '07 70% CFX. No anginal symptoms.  She has been intolerant to [employer] previously. Echo in 2015 showed normal LVF  Dyslipidemia Tolerating Crestor 20 mg- PCP following lipids   PLAN  Ms Isidoro is an acceptable risk for proposed surgery from a cardiovascular standpoint. F/U with Dr Aundra Dubin in 3 months or so.   Kerin Ransom K PA-C 06/30/2015 10:57 AM

## 2015-06-30 NOTE — Assessment & Plan Note (Signed)
Here for pre op cardiovascular clearance prior to hernia repair

## 2015-06-30 NOTE — Telephone Encounter (Signed)
Called patient, busy signal, tried several times.

## 2015-06-30 NOTE — Assessment & Plan Note (Signed)
MI PTCA in '99.  Cath '07 70% CFX. No anginal symptoms.  She has been intolerant to [employer] previously. Echo in 2015 showed normal LVF

## 2015-06-30 NOTE — Patient Instructions (Signed)
Cleared to have upcoming surgery    Your physician wants you to follow-up in: 3 months with Dr.Mclean. You will receive a reminder letter in the mail two months in advance. If you don't receive a letter, please call our office to schedule the follow-up appointment.

## 2015-06-30 NOTE — Telephone Encounter (Signed)
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

## 2015-06-30 NOTE — Telephone Encounter (Signed)
Surgical clearance letter faxed to Dr.Reyes at fax # [phone].

## 2015-07-03 NOTE — Telephone Encounter (Signed)
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  I advised her I have updated her record.  She would like this information to be sent to Dr Randolm Idol as well.

## 2015-07-05 ENCOUNTER — Ambulatory Visit: Payer: BLUE CROSS/BLUE SHIELD | Admitting: Internal Medicine

## 2015-07-10 ENCOUNTER — Other Ambulatory Visit: Payer: Self-pay | Admitting: Internal Medicine

## 2015-08-01 DIAGNOSIS — Z9889 Other specified postprocedural states: Secondary | ICD-10-CM

## 2015-08-01 DIAGNOSIS — Z8719 Personal history of other diseases of the digestive system: Secondary | ICD-10-CM | POA: Insufficient documentation

## 2015-08-10 ENCOUNTER — Telehealth: Payer: Self-pay | Admitting: Internal Medicine

## 2015-08-10 DIAGNOSIS — K432 Incisional hernia without obstruction or gangrene: Secondary | ICD-10-CM

## 2015-08-10 NOTE — Telephone Encounter (Signed)
Pt daughter called in and wanted to see if Dr burns could pt in labs .Marland Kitchen  What labs?  I am not sure.  Hospital wants something rechecking in a week.  She did not want to have to bring her in and there are not any appts for her to fu in a week with Dr burns.

## 2015-08-10 NOTE — Telephone Encounter (Signed)
Spoke with pts daughter, States she will be discharged from the hospital on Friday or Saturday 5/5 or 5/6 and surgeon would like her to get blood work done by her PCP once discharged. Asking for a BMET and CDC. Labs have been entered and pts daughter is aware.

## 2015-08-15 ENCOUNTER — Other Ambulatory Visit (INDEPENDENT_AMBULATORY_CARE_PROVIDER_SITE_OTHER): Payer: BLUE CROSS/BLUE SHIELD

## 2015-08-15 ENCOUNTER — Telehealth: Payer: Self-pay | Admitting: *Deleted

## 2015-08-15 DIAGNOSIS — K432 Incisional hernia without obstruction or gangrene: Secondary | ICD-10-CM

## 2015-08-15 LAB — BASIC METABOLIC PANEL
BUN: 11 mg/dL (ref 6–23)
CHLORIDE: 96 meq/L (ref 96–112)
CO2: 33 meq/L — AB (ref 19–32)
Calcium: 9.3 mg/dL (ref 8.4–10.5)
Creatinine, Ser: 0.55 mg/dL (ref 0.40–1.20)
GFR: 114.62 mL/min (ref >60.00)
Glucose, Bld: 163 mg/dL — ABNORMAL HIGH (ref 70–99)
POTASSIUM: 3.4 meq/L — AB (ref 3.5–5.1)
SODIUM: 138 meq/L (ref 135–145)

## 2015-08-15 LAB — CBC WITH DIFFERENTIAL/PLATELET
BASOS PCT: 0.5 % (ref 0.0–3.0)
Basophils Absolute: 0.1 10*3/uL (ref 0.0–0.1)
EOS ABS: 0.4 10*3/uL (ref 0.0–0.7)
Eosinophils Relative: 3.5 % (ref 0.0–5.0)
HEMATOCRIT: 31.9 % — AB (ref 36.0–46.0)
HEMOGLOBIN: 10.5 g/dL — AB (ref 12.0–15.0)
Lymphocytes Relative: 9.1 % — ABNORMAL LOW (ref 12.0–46.0)
Lymphs Abs: 1.1 10*3/uL (ref 0.7–4.0)
MCHC: 33 g/dL (ref 30.0–36.0)
MCV: 100.6 fl — ABNORMAL HIGH (ref 78.0–100.0)
MONO ABS: 0.8 10*3/uL (ref 0.1–1.0)
Monocytes Relative: 6.6 % (ref 3.0–12.0)
Neutro Abs: 9.9 10*3/uL — ABNORMAL HIGH (ref 1.4–7.7)
Neutrophils Relative %: 80.3 % — ABNORMAL HIGH (ref 43.0–77.0)
Platelets: 416 10*3/uL — ABNORMAL HIGH (ref 150.0–400.0)
RBC: 3.17 Mil/uL — AB (ref 3.87–5.11)
RDW: 13.1 % (ref 11.5–15.5)
WBC: 12.3 10*3/uL — AB (ref 4.0–10.5)

## 2015-08-15 NOTE — Telephone Encounter (Signed)
Left msg on triage stating wanting to make MD aware mom has develop cough since she been home from hosp. Coughing up yellowish phlegm, denied SOB or fever. She has appt schedule for this Friday is there anything she can take for sxs or be seen sooner...Johny Chess

## 2015-08-15 NOTE — Telephone Encounter (Signed)
Can try mucinex. Not sure if [name] can see her Wednesday or not.  She needs to be seen, but if not getting worse may be ok to wait until Friday.

## 2015-08-16 NOTE — Telephone Encounter (Signed)
Notified pt w/MD response she states want to keep appt w/Dr. Quay Burow does not feel bad will try the mucinex until appt.Marland KitchenTresa Garter

## 2015-08-18 ENCOUNTER — Other Ambulatory Visit (INDEPENDENT_AMBULATORY_CARE_PROVIDER_SITE_OTHER): Payer: BLUE CROSS/BLUE SHIELD

## 2015-08-18 ENCOUNTER — Ambulatory Visit (INDEPENDENT_AMBULATORY_CARE_PROVIDER_SITE_OTHER): Payer: BLUE CROSS/BLUE SHIELD | Admitting: Internal Medicine

## 2015-08-18 ENCOUNTER — Ambulatory Visit (INDEPENDENT_AMBULATORY_CARE_PROVIDER_SITE_OTHER)
Admission: RE | Admit: 2015-08-18 | Discharge: 2015-08-18 | Disposition: A | Discharge status: Home / Self Care | Payer: BLUE CROSS/BLUE SHIELD | Source: Ambulatory Visit | Attending: Internal Medicine | Admitting: Internal Medicine

## 2015-08-18 ENCOUNTER — Encounter: Payer: Self-pay | Admitting: Internal Medicine

## 2015-08-18 VITALS — BP 128/84 | HR 87 | Temp 98.1°F | Resp 16 | Wt 122.0 lb

## 2015-08-18 DIAGNOSIS — R05 Cough: Secondary | ICD-10-CM

## 2015-08-18 DIAGNOSIS — E222 Syndrome of inappropriate secretion of antidiuretic hormone: Secondary | ICD-10-CM

## 2015-08-18 DIAGNOSIS — R918 Other nonspecific abnormal finding of lung field: Secondary | ICD-10-CM | POA: Diagnosis not present

## 2015-08-18 DIAGNOSIS — R059 Cough, unspecified: Secondary | ICD-10-CM

## 2015-08-18 DIAGNOSIS — E871 Hypo-osmolality and hyponatremia: Secondary | ICD-10-CM

## 2015-08-18 DIAGNOSIS — K432 Incisional hernia without obstruction or gangrene: Secondary | ICD-10-CM | POA: Diagnosis not present

## 2015-08-18 LAB — CBC WITH DIFFERENTIAL/PLATELET
BASOS PCT: 0.5 % (ref 0.0–3.0)
Basophils Absolute: 0 10*3/uL (ref 0.0–0.1)
EOS PCT: 3.6 % (ref 0.0–5.0)
Eosinophils Absolute: 0.3 10*3/uL (ref 0.0–0.7)
HEMATOCRIT: 33.3 % — AB (ref 36.0–46.0)
Hemoglobin: 11.1 g/dL — ABNORMAL LOW (ref 12.0–15.0)
LYMPHS PCT: 18.9 % (ref 12.0–46.0)
Lymphs Abs: 1.6 10*3/uL (ref 0.7–4.0)
MCHC: 33.2 g/dL (ref 30.0–36.0)
MCV: 98.7 fl (ref 78.0–100.0)
MONOS PCT: 12.6 % — AB (ref 3.0–12.0)
Monocytes Absolute: 1.1 10*3/uL — ABNORMAL HIGH (ref 0.1–1.0)
NEUTROS ABS: 5.6 10*3/uL (ref 1.4–7.7)
NEUTROS PCT: 64.4 % (ref 43.0–77.0)
PLATELETS: 578 10*3/uL — AB (ref 150.0–400.0)
RBC: 3.37 Mil/uL — ABNORMAL LOW (ref 3.87–5.11)
RDW: 12.9 % (ref 11.5–15.5)
WBC: 8.7 10*3/uL (ref 4.0–10.5)

## 2015-08-18 LAB — COMPREHENSIVE METABOLIC PANEL
ALT: 25 U/L (ref 0–35)
AST: 14 U/L (ref 0–37)
Albumin: 3.3 g/dL — ABNORMAL LOW (ref 3.5–5.2)
Alkaline Phosphatase: 101 U/L (ref 39–117)
BUN: 11 mg/dL (ref 6–23)
CALCIUM: 9.8 mg/dL (ref 8.4–10.5)
CHLORIDE: 99 meq/L (ref 96–112)
CO2: 31 meq/L (ref 19–32)
Creatinine, Ser: 0.57 mg/dL (ref 0.40–1.20)
GFR: 109.99 mL/min (ref >60.00)
Glucose, Bld: 125 mg/dL — ABNORMAL HIGH (ref 70–99)
POTASSIUM: 4.6 meq/L (ref 3.5–5.1)
Sodium: 137 mEq/L (ref 135–145)
Total Bilirubin: 0.2 mg/dL (ref 0.2–1.2)
Total Protein: 7 g/dL (ref 6.0–8.3)

## 2015-08-18 NOTE — Progress Notes (Signed)
Pre visit review using our clinic review tool, if applicable. No additional management support is needed unless otherwise documented below in the visit note. 

## 2015-08-18 NOTE — Patient Instructions (Signed)
  Test(s) ordered today. Your results will be released to MyChart (or called to you) after review, usually within 72hours after test completion. If any changes need to be made, you will be notified at that same time.    Medications reviewed and updated.  No changes recommended at this time.     

## 2015-08-18 NOTE — Progress Notes (Signed)
Subjective:    Patient ID: [name], female    DOB: 1940/11/17, 75 y.o.   MRN: [id number]  HPI She is here for follow up from surgery.  She had surgery on April 25th - repair of incisional lower abdominal hernia.  The surgery went well.  Three days after surgery she had aggitation, delirium and went to the ICU.  She had significant hyponatremia (SIADH), her sodium was down to 104.  it was thought it was related to a combination of her psych meds and anesthesia.  In ICU meds were temporarily stopped.  She was treated initially with 3% saline and then 1/2 NS.  Her sodium returned to normal.  She had a Ct of her Chest while in the ICU that showed a possible atypical infection/inflammation. A Ct was recommended to be done in 3 months.  She was not treated with any antibiotics.   Cough, productive:  She has had a cough that started in the hospital.  It is intermittent and she occasonally brings up some green phlegm.  It is worse when she gets warm.  It seems to get better, but comes back.  She denies any cold symptoms and has not have any fever.  She denies sob and wheeze.  Ct Chest w/o contrast  08/11/2015: Impression: Bilateral groundglass opacities and mild tree in bud micronodular opacities most consistent with atypical infection/inflammation.  Recommend three-month follow up CT to ensure resolution and exclude persistence of any of the groundglass lesions, as neoplastic nodules can sometimes have a nonsolid appearance.  Particular attention to 3 cm lesion RUL.     Medications and allergies reviewed with patient and updated if appropriate.  Patient Active Problem List   Diagnosis Date Noted  . Pre-operative cardiovascular examination 06/30/2015  . Corneal abrasion, left 06/08/2015  . Incisional hernia, without obstruction or gangrene 06/04/2015  . Bradycardia 04/18/2014  . Fatigue 05/14/2013  . Osteoporosis 05/17/2012  . Acute delirium 02/28/2012  . Hypertension 02/28/2012  .  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 Diverticulitis 10/24/2011  . Pulmonic insufficiency 08/27/2010  . TREMOR, ESSENTIAL 06/20/2010  . CARDIOMEGALY, MILD 02/01/2010  . UNSPECIFIED VITAMIN D DEFICIENCY 01/16/2010  . HYPERGLYCEMIA, FASTING 05/19/2009  . LIPOMAS, MULTIPLE 09/26/2008  . Anxiety state 07/14/2008  . Dyslipidemia 07/11/2008  . FIBROCYSTIC BREAST DISEASE 06/08/2008  . Irritable bowel syndrome 08/27/2007  . GERD 08/26/2007  . CAD S/P percutaneous coronary angioplasty 07/10/2007  . Migraine, unspecified, without mention of intractable migraine without mention of status migrainosus 03/12/2007  . ACUT MI ANTEROLAT WALL SUBSQT EPIS CARE 03/12/2007  . ALLERGIC RHINITIS 03/12/2007  . ABDOMINAL BRUIT 03/12/2007  . Diverticulosis of large intestine 11/25/2006    Current Outpatient Prescriptions on File Prior to Visit  Medication Sig Dispense Refill  . acetaminophen (TYLENOL) 650 MG CR tablet Take 1,300 mg by mouth 2 (two) times daily.    Marland Kitchen alendronate (FOSAMAX) 70 MG tablet TAKE 1 TAB ONCE A WEEK, AT LEAST 30 MIN BEFORE 1ST FOOD.DO NOT LIE DOWN FOR 30 MIN AFTER TAKING. 4 tablet 1  . BIOTIN PO Take by mouth.    Marland Kitchen buPROPion (WELLBUTRIN XL) 150 MG 24 hr tablet Take 150 mg by mouth daily.    . clonazePAM (KLONOPIN) 0.5 MG tablet Take 0.5 mg by mouth 2 (two) times daily.     . Coenzyme Q10 (COQ10) 100 MG CAPS Take 1 capsule by mouth daily.     Marland Kitchen donepezil (ARICEPT) 10 MG tablet Take 10 mg by mouth  every morning.    . Multiple Vitamins-Minerals (CENTRUM SILVER PO) Take by mouth.    Marland Kitchen NEXIUM 40 MG capsule TAKE (1) CAPSULE DAILY. 30 capsule 0  . Cholecalciferol 5000 units TABS Take 1 tablet by mouth daily.    . clobetasol cream (TEMOVATE) [id number] % Apply 1 application topically once a week.     Marland Kitchen EVENING PRIMROSE OIL PO Take by mouth.    . Ginkgo Biloba 40 MG TABS Take by mouth.    . IRON PO Take by mouth.    . Lactase (LACTAID PO) Take 1 tablet by mouth as needed (Lactose  Intolerance).    . loratadine (CLARITIN) 10 MG tablet Take 10 mg by mouth daily.    . Melatonin 5 MG TABS Take 1 tablet by mouth at bedtime.     . methylPREDNISolone acetate PF (DEPO-MEDROL) 40 MG/ML SUSP injection Every 6 months with Dr. Nelva Bush 1 mL   . naproxen sodium (RA NAPROXEN SODIUM) 220 MG tablet Take 220 mg by mouth 3 (three) times daily.    . Potassium (POTASSIMIN PO) Take by mouth daily.    . Psyllium (METAMUCIL PO) Take by mouth. 4 by mouth daily    . QUEtiapine Fumarate (SEROQUEL XR) 150 MG 24 hr tablet Take 150 mg by mouth at bedtime.    . rosuvastatin (CRESTOR) 20 MG tablet TAKE ONE TABLET AT BEDTIME. 30 tablet 1  . Simethicone (GAS-X PO) Take by mouth.    . Vitamin E (E200 PO) Take by mouth.     No current facility-administered medications on file prior to visit.    Past Medical History  Diagnosis Date  . Abnormal weight gain   . Unspecified adverse effect of unspecified drug, medicinal and biological substance   . Migraine, unspecified, without mention of intractable migraine without mention of status migrainosus   . Abdominal bruit   . Allergic rhinitis, cause unspecified   . Diverticular disease     acute diverticulitis 10/13/11  . CAD (coronary artery disease)     diaphragmatic wall infarction in 1999, treated with balloon angioplasty with nonobstructive disease at ast catheterization in 2007 as described above  . Hyperlipemia     with low HDL.  . IBS (irritable bowel syndrome)   . GERD (gastroesophageal reflux disease)   . Fibromyalgia   . Tremor   . Depression   . Colon polyp 12/20/2011    Tubular adenoma    Past Surgical History  Procedure Laterality Date  . Abdominal surgery       @ [facility]  in [place] in 2002  for ptosis of organs; mersilene mesh sling, cystocopy  by Dr  Rosana Hoes, Urologist  . Colonoscopy  2011    Diverticulosis & hemorrhoids; Dr Olevia Perches  . [place]      RECTOCOELE  . Bladder surgery  patient unsure of date    sling  . Cataract extraction       left eye  . Partial hysterectomy    . Tonsillectomy  as child  . Abdominal hysterectomy  1979    partial  . Laparoscopic sigmoid colectomy  02/26/2012    Procedure: LAPAROSCOPIC SIGMOID COLECTOMY;  Surgeon: Adin Hector, MD;  Location: [facility];  Service: General;  Laterality: N/A;  Laparoscopic Assisted Sigmoid Colectomy  . Partial colectomy  02/26/2012    Procedure: PARTIAL COLECTOMY;  Surgeon: Adin Hector, MD;  Location: [facility];  Service: General;  Laterality: N/A;  Sigmoid Colectomy    Social History   Social History  .  Marital Status: Married    Spouse Name: N/A  . Number of Children: N/A  . Years of Education: N/A   Occupational History  . Retired Pharmacist, hospital    Social History Main Topics  . Smoking status: Never Smoker   . Smokeless tobacco: Never Used     Comment: second hand smoke for decades  . Alcohol Use: No  . Drug Use: No  . Sexual Activity: Not Asked   Other Topics Concern  . None   Social History Narrative   High fiber diet          Family History  Problem Relation Age of Onset  . Heart attack Brother 34    1/2 BROTHER  . Bipolar disorder Sister   . Heart attack Sister 45  . Lymphoma Sister   . Heart attack Father 67  . Ulcers Father   . Lung cancer Father   . Stroke Sister     1/2 SISTER  . Alcohol abuse Mother   . Colon cancer Neg Hx   . Other Brother     brain tumor  . Mental illness Son     Review of Systems  Constitutional: Positive for appetite change (appetite fair). Negative for fever.  HENT: Negative for congestion, ear pain, sinus pressure and sore throat.   Respiratory: Positive for cough. Negative for shortness of breath and wheezing.   Cardiovascular: Negative for chest pain, palpitations and leg swelling.  Gastrointestinal: Positive for abdominal pain (from surgery).       Bowel movements loose  Musculoskeletal: Positive for joint swelling.  Neurological: Negative for light-headedness and headaches.    Psychiatric/Behavioral: Negative for confusion and agitation.       Objective:   Filed Vitals:   08/18/15 1122  BP: 128/84  Pulse: 87  Temp: 98.1 F (36.7 C)  Resp: 16   Filed Weights   08/18/15 1122  Weight: 122 lb (55.339 kg)   Body mass index is 23.06 kg/(m^2).   Physical Exam .Constitutional: Appears well-developed and well-nourished. No distress.  Neck: Neck supple. No tracheal deviation present. No thyromegaly present.  No carotid bruit. No cervical adenopathy.   Cardiovascular: Normal rate, regular rhythm and normal heart sounds.   No murmur heard.  No edema Pulmonary/Chest: Effort normal and breath sounds normal. No respiratory distress. No wheezes.       Assessment & Plan:    See Problem List for Assessment and Plan of chronic medical problems.

## 2015-08-19 DIAGNOSIS — R918 Other nonspecific abnormal finding of lung field: Secondary | ICD-10-CM | POA: Insufficient documentation

## 2015-08-19 DIAGNOSIS — E871 Hypo-osmolality and hyponatremia: Secondary | ICD-10-CM | POA: Insufficient documentation

## 2015-08-19 DIAGNOSIS — E222 Syndrome of inappropriate secretion of antidiuretic hormone: Secondary | ICD-10-CM | POA: Insufficient documentation

## 2015-08-19 DIAGNOSIS — R059 Cough, unspecified: Secondary | ICD-10-CM | POA: Insufficient documentation

## 2015-08-19 DIAGNOSIS — R05 Cough: Secondary | ICD-10-CM | POA: Insufficient documentation

## 2015-08-19 NOTE — Assessment & Plan Note (Signed)
Sodium earlier this week was normal Recheck sodium Likely resolved Will monitor sodium levels in future, but it has not been an issue in the past on her current medications

## 2015-08-19 NOTE — Assessment & Plan Note (Signed)
Will repeat Ct scan of lungs in 3 months (beginning of august)

## 2015-08-19 NOTE — Assessment & Plan Note (Addendum)
S/p abdominal hernia repair 08/01/15 Surgery went well - developed SIADH Recovering from surgery - abdominal discomfort Appetite normal, bowel movements regular - slight loose stool Will f/u with surgery

## 2015-08-19 NOTE — Assessment & Plan Note (Signed)
Concern for atypical infection based on symptoms and recent Ct scan She is allergic to most antibiotics Symptoms are not improving on own.  Will check cxr - would like to avoid antibiotics if possible due to multiple allergies - will decide about antibiotic after cxr

## 2015-08-21 ENCOUNTER — Telehealth: Payer: Self-pay | Admitting: Internal Medicine

## 2015-08-21 MED ORDER — CLINDAMYCIN HCL 300 MG PO CAPS: 300.0000 mg | ORAL_CAPSULE | Freq: Three times a day (TID) | ORAL | Status: DC

## 2015-08-21 NOTE — Telephone Encounter (Signed)
SPoke with pts daughter to inform.  

## 2015-08-21 NOTE — Telephone Encounter (Signed)
Please advise 

## 2015-08-21 NOTE — Telephone Encounter (Signed)
[name] called in (daughter) said that pt would like to try the antibiotic because her chest has not cleared up.  Can this be call in?    [place]

## 2015-08-21 NOTE — Telephone Encounter (Signed)
Yes I will send it in.  As I discussed with [name] over the weekend this antibiotic has a high likelihood of causing diarrhea.  If she gets this she needs to let me know.  She should make sure she is taking probiotics with it and take them for at least two weeks after finishing the antibiotic.  Call if no improvement.

## 2015-08-24 ENCOUNTER — Telehealth: Payer: Self-pay | Admitting: Emergency Medicine

## 2015-08-24 NOTE — Telephone Encounter (Signed)
Pts med list updated from post-surgery.

## 2015-08-25 ENCOUNTER — Encounter: Payer: Self-pay | Admitting: Internal Medicine

## 2015-08-26 ENCOUNTER — Encounter: Payer: Self-pay | Admitting: Internal Medicine

## 2015-08-26 DIAGNOSIS — T8859XA Other complications of anesthesia, initial encounter: Secondary | ICD-10-CM | POA: Insufficient documentation

## 2015-08-26 DIAGNOSIS — T4145XA Adverse effect of unspecified anesthetic, initial encounter: Secondary | ICD-10-CM | POA: Insufficient documentation

## 2015-08-29 ENCOUNTER — Other Ambulatory Visit: Payer: Self-pay | Admitting: Cardiology

## 2015-10-12 DIAGNOSIS — M25572 Pain in left ankle and joints of left foot: Secondary | ICD-10-CM | POA: Insufficient documentation

## 2015-10-22 ENCOUNTER — Other Ambulatory Visit: Payer: Self-pay | Admitting: Internal Medicine

## 2015-11-07 ENCOUNTER — Ambulatory Visit (INDEPENDENT_AMBULATORY_CARE_PROVIDER_SITE_OTHER)
Admission: RE | Admit: 2015-11-07 | Discharge: 2015-11-07 | Disposition: A | Discharge status: Home / Self Care | Payer: BLUE CROSS/BLUE SHIELD | Source: Ambulatory Visit | Attending: Internal Medicine | Admitting: Internal Medicine

## 2015-11-07 DIAGNOSIS — R918 Other nonspecific abnormal finding of lung field: Secondary | ICD-10-CM | POA: Diagnosis not present

## 2015-12-18 ENCOUNTER — Other Ambulatory Visit: Payer: Self-pay | Admitting: Internal Medicine

## 2016-01-01 ENCOUNTER — Encounter: Payer: Self-pay | Admitting: Internal Medicine

## 2016-01-01 DIAGNOSIS — E871 Hypo-osmolality and hyponatremia: Secondary | ICD-10-CM

## 2016-01-01 DIAGNOSIS — E538 Deficiency of other specified B group vitamins: Secondary | ICD-10-CM

## 2016-01-15 ENCOUNTER — Encounter: Payer: Self-pay | Admitting: Internal Medicine

## 2016-01-15 ENCOUNTER — Other Ambulatory Visit: Payer: Self-pay | Admitting: Internal Medicine

## 2016-01-15 ENCOUNTER — Other Ambulatory Visit (INDEPENDENT_AMBULATORY_CARE_PROVIDER_SITE_OTHER): Payer: BLUE CROSS/BLUE SHIELD

## 2016-01-15 DIAGNOSIS — E538 Deficiency of other specified B group vitamins: Secondary | ICD-10-CM

## 2016-01-15 DIAGNOSIS — E871 Hypo-osmolality and hyponatremia: Secondary | ICD-10-CM

## 2016-01-15 LAB — COMPREHENSIVE METABOLIC PANEL
ALK PHOS: 57 U/L (ref 39–117)
ALT: 11 U/L (ref 0–35)
AST: 19 U/L (ref 0–37)
Albumin: 4 g/dL (ref 3.5–5.2)
BILIRUBIN TOTAL: 0.4 mg/dL (ref 0.2–1.2)
BUN: 12 mg/dL (ref 6–23)
CO2: 31 meq/L (ref 19–32)
Calcium: 9.3 mg/dL (ref 8.4–10.5)
Chloride: 103 mEq/L (ref 96–112)
Creatinine, Ser: 0.85 mg/dL (ref 0.40–1.20)
GFR: 69.28 mL/min (ref >60.00)
GLUCOSE: 90 mg/dL (ref 70–99)
Potassium: 4.5 mEq/L (ref 3.5–5.1)
SODIUM: 140 meq/L (ref 135–145)
TOTAL PROTEIN: 6.9 g/dL (ref 6.0–8.3)

## 2016-01-15 LAB — VITAMIN B12: VITAMIN B 12: 496 pg/mL (ref 211–911)

## 2016-02-27 ENCOUNTER — Telehealth: Payer: Self-pay | Admitting: Cardiology

## 2016-02-27 NOTE — Telephone Encounter (Signed)
New Message:    Pt says she would like to be seen by whoever Dr Aundra Dubin recommend soon. Would Dr Aundra Dubin please do this for her please.

## 2016-02-28 NOTE — Telephone Encounter (Signed)
F/u message ° °Pt returning RN call. Please call back to discuss  °

## 2016-02-28 NOTE — Telephone Encounter (Signed)
LMTCB

## 2016-03-04 NOTE — Telephone Encounter (Signed)
Called, spoke with pt. Asked how could I help. Pt stated she had a scheduling question to speak with Webb Silversmith about. Pt requested a call back on Wednesday (11/29) Informed I would forward this to [facility].

## 2016-03-04 NOTE — Telephone Encounter (Signed)
Follow Up:    Returning Anne's call from last Wednesday.

## 2016-03-06 NOTE — Telephone Encounter (Signed)
Follow up  Pt is returning nurses call.   Please f/u

## 2016-03-06 NOTE — Telephone Encounter (Signed)
Pt calling for appointment with Dr Aundra Dubin. Pt states she is not having any problems just time for follow up.

## 2016-03-06 NOTE — Telephone Encounter (Signed)
Pt asking for a recommendation for follow up from Dr Aundra Dubin given his transition to CHF. Pt advised I will forward to Dr Aundra Dubin for review.

## 2016-03-06 NOTE — Telephone Encounter (Signed)
LMTCB

## 2016-03-07 NOTE — Telephone Encounter (Signed)
If she's due for followup, work her in at [facility] prior to Jan 1.  I would be glad to see her with her husband long-term at CHF clinic.

## 2016-03-07 NOTE — Telephone Encounter (Signed)
Pt aware she has been scheduled to see Dr Aundra Dubin 03/21/16

## 2016-03-12 ENCOUNTER — Other Ambulatory Visit: Payer: Self-pay | Admitting: Internal Medicine

## 2016-03-21 ENCOUNTER — Encounter: Payer: Self-pay | Admitting: Cardiology

## 2016-03-21 ENCOUNTER — Ambulatory Visit (INDEPENDENT_AMBULATORY_CARE_PROVIDER_SITE_OTHER): Payer: BLUE CROSS/BLUE SHIELD | Admitting: Cardiology

## 2016-03-21 VITALS — BP 136/72 | HR 68 | Ht 61.0 in | Wt 123.0 lb

## 2016-03-21 DIAGNOSIS — I2510 Atherosclerotic heart disease of native coronary artery without angina pectoris: Secondary | ICD-10-CM | POA: Diagnosis not present

## 2016-03-21 DIAGNOSIS — Z9861 Coronary angioplasty status: Secondary | ICD-10-CM

## 2016-03-21 DIAGNOSIS — E785 Hyperlipidemia, unspecified: Secondary | ICD-10-CM

## 2016-03-21 LAB — LIPID PANEL
CHOLESTEROL: 136 mg/dL (ref <200)
HDL: 44 mg/dL — ABNORMAL LOW (ref >50)
LDL Cholesterol: 60 mg/dL (ref <100)
TRIGLYCERIDES: 162 mg/dL — AB (ref <150)
Total CHOL/HDL Ratio: 3.1 Ratio (ref <5.0)
VLDL: 32 mg/dL — AB (ref <30)

## 2016-03-21 MED ORDER — ROSUVASTATIN CALCIUM 20 MG PO TABS: 20.0000 mg | ORAL_TABLET | Freq: Every day | ORAL | 3 refills | Status: DC

## 2016-03-21 NOTE — Progress Notes (Addendum)
Patient ID: [name], female   DOB: 1940-05-06, 75 y.o.   MRN: [id number] PCP: Dr. Quay Burow  75 yo with history of CAD s/p inferior MI in 1999 and left heart cath in 4/07 with 70% proximal CFX stenosis but normal myoview presents for cardiology followup.  In 4/17, she had ventral hernia repair.  This was complicated by severe hyponatremia.    No chest pain.  Currently, she is mildly short of breath when walking up a steep hill.  She can walk up a flight of steps with no problem.  Mild dyspnea after walking about 150 yards if she hurries.  No orthopnea or PND.  She is not taking ASA due to GI upset.  Weight is down 9 lbs.   ECG: NSR, PVCs  Labs (3/12): TSH normal, free T3 low, free T4 normal, LDL 90 Labs (2/13): TGs 255, HDL 43, LDL 84 Labs (11/13): K 3.4, creatinine 0.55 Labs (1/16): K 3.5, creatinine 0.7, LDL 71, HDL 35, TSH normal, HCT 39.9 Labs (2/17): LDL 78, HDL 49 Labs (10/17): K 4.5, creatinine 0.85  PMH: 1. CAD: Inferior MI in 1999 treated with PTCA.  Left heart cath in 4/07 showed 70% proximal CFX stenosis with EF 60%.   - Myoview was done post-cath and showed no ischemia or infarction.   - Echo (11/11): EF 60-65%, moderate pulmonic insufficiency. Echo (11/12) with EF 55-60%, normal RV, mild PI.  Echo (2/15) with EF 55-60%, mild LVH, mild MR, mild AI.  3. Hyperlipidemia 4. GERD 5. Fibromyalgia 6. C-spine stenosis 7. Migraines 8. IBS 9. Recurrent diverticulitis: sigmoid diverticulitis 11/13.  10. Essential tremor 11. Bradycardia 12. Ventral hernia repair 4/17: She developed severe hyponatremia post-operatively.    Allergies  Allergen Reactions  . Amphetamine-Dextroamphet Er Other (See Comments)    unknowb reaction  . Hydromorphone Shortness Of Breath    BREATHING AND CARDIAC ISSUES  . Metronidazole Other (See Comments)    Unknown REACTION: SORES IN MOUTH, severe diarrhea  . Penicillins     RASH  . Thorazine [Chlorpromazine] Other (See Comments)    " had a total  hepatic wipeout" in 1974  . Bupivacaine Other (See Comments)    Unknown  . Atorvastatin Other (See Comments)    Upset stomach  . Azithromycin     Unknown reaction  . Cephalexin     Unknown reaction  . Chlorpromazine Hcl     Unknown reaction  . Ciprofloxacin     REACTION: SEVERE PAIN,DEHYDRATION,DIARRHEA  . Doxycycline Diarrhea  . Ezetimibe     REACTION: JOINT/MUSCLE ACHES  . Hyoscyamine Sulfate     Unknown reaction  . Lactose Intolerance (Gi) Diarrhea  . Lidocaine Swelling  . Oatmeal Other (See Comments)     Sinus Congestion   . Sulfamethoxazole     REACTION: GI upset-acid reflux  . Telithromycin     Unknown reaction  . Topiramate     Unknown reaction  . Latex Rash   SH: Married, husband has lung cancer.  3 children.  Never smoked.  Lives in [place].   FH: Father with MI.   ROS: All systems reviewed and negative except as per HPI.   Current Outpatient Prescriptions  Medication Sig Dispense Refill  . acetaminophen (TYLENOL) 650 MG CR tablet Take 1,300 mg by mouth 2 (two) times daily.    Marland Kitchen alendronate (FOSAMAX) 70 MG tablet TAKE 1 TAB ONCE A WEEK, AT LEAST 30 MIN BEFORE 1ST FOOD.DO NOT LIE DOWN FOR 30 MIN AFTER TAKING. 4 tablet  1  . BIOTIN PO Take by mouth.    Marland Kitchen buPROPion (WELLBUTRIN XL) 150 MG 24 hr tablet Take 150 mg by mouth daily.    . Calcium Carbonate (CALTRATE 600 PO) Take 1 tablet by mouth daily.    . Cholecalciferol (VITAMIN D3 PO) Take 1 tablet by mouth daily.    . clobetasol cream (TEMOVATE) [id number] % Apply 1 application topically once a week.     . cloNIDine (CATAPRES) 0.1 MG tablet Take 0.1 mg by mouth daily.     . Coenzyme Q10 (COQ10) 100 MG CAPS Take 1 capsule by mouth daily.     Marland Kitchen donepezil (ARICEPT) 10 MG tablet Take 10 mg by mouth every morning.    . Ginkgo Biloba 40 MG TABS Take by mouth.    . IRON PO Take by mouth.    . Lactase (LACTAID PO) Take 1 tablet by mouth as needed (Lactose Intolerance).    . loratadine (CLARITIN) 10 MG tablet Take 10 mg  by mouth daily.    . Melatonin 5 MG TABS Take 1 tablet by mouth at bedtime.     . methylPREDNISolone acetate PF (DEPO-MEDROL) 40 MG/ML SUSP injection Every 6 months with Dr. Nelva Bush 1 mL   . Multiple Vitamins-Minerals (CENTRUM SILVER PO) Take by mouth.    . naproxen sodium (RA NAPROXEN SODIUM) 220 MG tablet Take 220 mg by mouth 2 (two) times daily with a meal.     . NEXIUM 40 MG capsule TAKE (1) CAPSULE DAILY. 30 capsule 0  . Potassium (POTASSIMIN PO) Take by mouth daily.    . Psyllium (METAMUCIL PO) Take by mouth. 4 by mouth daily    . QUEtiapine Fumarate (SEROQUEL XR) 150 MG 24 hr tablet Take 150 mg by mouth at bedtime.    . rosuvastatin (CRESTOR) 20 MG tablet Take 1 tablet (20 mg total) by mouth at bedtime. 90 tablet 3  . Simethicone (GAS-X PO) Take by mouth.    . Vitamin E (E200 PO) Take by mouth.    Marland Kitchen aspirin EC 81 MG tablet Take 1 tablet (81 mg total) by mouth every other day.     No current facility-administered medications for this visit.     BP 136/72   Pulse 68   Ht 5\' 1"  (1.549 m)   Wt 123 lb (55.8 kg)   BMI 23.24 kg/m  General: NAD Neck: No JVD, no thyromegaly or thyroid nodule.  Lungs: Clear to auscultation bilaterally with normal respiratory effort. CV: Nondisplaced PMI.  Heart regular S1/S2, no S3/S4, no murmur.  No peripheral edema.  No carotid bruit.  Normal pedal pulses.  Abdomen: Soft, nontender, no hepatosplenomegaly, no distention.  Neurologic: Alert and oriented x 3.  Psych: Normal affect. Extremities: No clubbing or cyanosis.   Assessment/Plan: 1. CAD: Stable with no ischemic symptoms.   - Continue statin. - She has GI intolerance with ASA 81 daily.  I will have her try taking ASA every other day.  If she cannot do that, I will have her take Plavix 75 mg daily.  She is on a PPI (if she changes to Plavix, would have to change PPI to Protonix).  2. Hyperlipidemia: Check lipids today.  3. Bradycardia: Resolved off propranolol.    I will see her back in 1 year  [facility]   [name] 03/21/2016

## 2016-03-21 NOTE — Patient Instructions (Signed)
Medication Instructions:  Take aspirin 81mg  every other day.  If you are unable to tolerate this dose, send a message to Dr Aundra Dubin through [place].  Labwork: Lipid profile today  Testing/Procedures: None   Follow-Up: Your physician wants you to follow-up in: 1 year with Dr Aundra Dubin in [facility]. (December 2018). [phone]  You will receive a reminder letter in the mail two months in advance. If you don't receive a letter, please call our office to schedule the follow-up appointment.        If you need a refill on your cardiac medications before your next appointment, please call your pharmacy.

## 2016-05-08 ENCOUNTER — Telehealth: Payer: Self-pay | Admitting: Internal Medicine

## 2016-05-08 DIAGNOSIS — Z0000 Encounter for general adult medical examination without abnormal findings: Secondary | ICD-10-CM

## 2016-05-08 DIAGNOSIS — M810 Age-related osteoporosis without current pathological fracture: Secondary | ICD-10-CM

## 2016-05-08 NOTE — Telephone Encounter (Signed)
Both ordered.

## 2016-05-08 NOTE — Telephone Encounter (Signed)
Pt is having a DEXA on 2/19 at [facility] and needs a order pt in or a script in order to get this .  Pt would also like to get labs done before her cpe appt in march

## 2016-05-08 NOTE — Telephone Encounter (Signed)
Please advise 

## 2016-05-09 NOTE — Telephone Encounter (Signed)
Spoke with pt to inform.  

## 2016-05-27 ENCOUNTER — Other Ambulatory Visit: Payer: Self-pay | Admitting: Internal Medicine

## 2016-05-28 ENCOUNTER — Encounter: Payer: Self-pay | Admitting: Internal Medicine

## 2016-05-28 DIAGNOSIS — Z9861 Coronary angioplasty status: Principal | ICD-10-CM

## 2016-05-28 DIAGNOSIS — I2510 Atherosclerotic heart disease of native coronary artery without angina pectoris: Secondary | ICD-10-CM

## 2016-05-28 DIAGNOSIS — I10 Essential (primary) hypertension: Secondary | ICD-10-CM

## 2016-05-28 DIAGNOSIS — R7309 Other abnormal glucose: Secondary | ICD-10-CM

## 2016-05-28 DIAGNOSIS — E785 Hyperlipidemia, unspecified: Secondary | ICD-10-CM

## 2016-06-04 ENCOUNTER — Encounter: Payer: Self-pay | Admitting: Internal Medicine

## 2016-06-04 LAB — HM MAMMOGRAPHY

## 2016-06-07 ENCOUNTER — Other Ambulatory Visit (INDEPENDENT_AMBULATORY_CARE_PROVIDER_SITE_OTHER): Payer: BLUE CROSS/BLUE SHIELD

## 2016-06-07 DIAGNOSIS — I10 Essential (primary) hypertension: Secondary | ICD-10-CM

## 2016-06-07 DIAGNOSIS — I2510 Atherosclerotic heart disease of native coronary artery without angina pectoris: Secondary | ICD-10-CM | POA: Diagnosis not present

## 2016-06-07 DIAGNOSIS — E785 Hyperlipidemia, unspecified: Secondary | ICD-10-CM

## 2016-06-07 DIAGNOSIS — Z9861 Coronary angioplasty status: Secondary | ICD-10-CM

## 2016-06-07 DIAGNOSIS — R7309 Other abnormal glucose: Secondary | ICD-10-CM

## 2016-06-07 LAB — COMPREHENSIVE METABOLIC PANEL
ALK PHOS: 53 U/L (ref 39–117)
ALT: 14 U/L (ref 0–35)
AST: 22 U/L (ref 0–37)
Albumin: 4.6 g/dL (ref 3.5–5.2)
BILIRUBIN TOTAL: 0.5 mg/dL (ref 0.2–1.2)
BUN: 15 mg/dL (ref 6–23)
CO2: 29 mEq/L (ref 19–32)
CREATININE: 0.88 mg/dL (ref 0.40–1.20)
Calcium: 9.8 mg/dL (ref 8.4–10.5)
Chloride: 105 mEq/L (ref 96–112)
GFR: 66.49 mL/min (ref >60.00)
GLUCOSE: 100 mg/dL — AB (ref 70–99)
Potassium: 4.7 mEq/L (ref 3.5–5.1)
SODIUM: 140 meq/L (ref 135–145)
TOTAL PROTEIN: 7.6 g/dL (ref 6.0–8.3)

## 2016-06-07 LAB — HEMOGLOBIN A1C: HEMOGLOBIN A1C: 5.7 % (ref 4.6–6.5)

## 2016-06-07 LAB — LIPID PANEL
CHOL/HDL RATIO: 3
Cholesterol: 150 mg/dL (ref 0–200)
HDL: 47.1 mg/dL (ref >39.00)
LDL Cholesterol: 78 mg/dL (ref 0–99)
NONHDL: 102.49
TRIGLYCERIDES: 124 mg/dL (ref 0.0–149.0)
VLDL: 24.8 mg/dL (ref 0.0–40.0)

## 2016-06-07 LAB — CBC WITH DIFFERENTIAL/PLATELET
BASOS ABS: 0 10*3/uL (ref 0.0–0.1)
Basophils Relative: 0.7 % (ref 0.0–3.0)
Eosinophils Absolute: 0.2 10*3/uL (ref 0.0–0.7)
Eosinophils Relative: 3.1 % (ref 0.0–5.0)
HCT: 40.5 % (ref 36.0–46.0)
HEMOGLOBIN: 13.8 g/dL (ref 12.0–15.0)
LYMPHS ABS: 1.5 10*3/uL (ref 0.7–4.0)
Lymphocytes Relative: 28 % (ref 12.0–46.0)
MCHC: 34.2 g/dL (ref 30.0–36.0)
MCV: 99.5 fl (ref 78.0–100.0)
MONO ABS: 0.8 10*3/uL (ref 0.1–1.0)
MONOS PCT: 15 % — AB (ref 3.0–12.0)
NEUTROS PCT: 53.2 % (ref 43.0–77.0)
Neutro Abs: 2.9 10*3/uL (ref 1.4–7.7)
Platelets: 205 10*3/uL (ref 150.0–400.0)
RBC: 4.07 Mil/uL (ref 3.87–5.11)
RDW: 12.7 % (ref 11.5–15.5)
WBC: 5.4 10*3/uL (ref 4.0–10.5)

## 2016-06-07 LAB — TSH: TSH: 1.89 u[IU]/mL (ref 0.35–4.50)

## 2016-06-09 ENCOUNTER — Encounter: Payer: Self-pay | Admitting: Internal Medicine

## 2016-06-09 NOTE — Progress Notes (Signed)
Subjective:    Patient ID: [name], female    DOB: 11-30-1940, 76 y.o.   MRN: [id number]  HPI She is here for a physical exam.   Leg cramps: She is having some leg cramps  - not real legs - just feel like leg cramps are about to start.  They used to only occur at night, but have occurred during the day.  They do not occur that often.   She exercises with a trainer two days a week.  The other days she walks or does stairs.   She has not concerns today.   Medications and allergies reviewed with patient and updated if appropriate.  Patient Active Problem List   Diagnosis Date Noted  . Acute left ankle pain 10/12/2015  . Anesthesia complication [id number]  . Abnormal CT scan, lung 08/11/15 08/19/2015  . SIADH (syndrome of inappropriate ADH production) ([place]) 08/19/2015  . Cough 08/19/2015  . Hyponatremia 08/19/2015  . S/P herniorrhaphy 08/01/2015  . Corneal abrasion, left 06/08/2015  . Ventral incisional hernia 06/04/2015  . Bradycardia 04/18/2014  . Fatigue 05/14/2013  . Osteoporosis 05/17/2012  . Acute delirium 02/28/2012  . Hypertension 02/28/2012  . 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 Diverticulitis 10/24/2011  . Pulmonic insufficiency 08/27/2010  . TREMOR, ESSENTIAL 06/20/2010  . CARDIOMEGALY, MILD 02/01/2010  . UNSPECIFIED VITAMIN D DEFICIENCY 01/16/2010  . HYPERGLYCEMIA, FASTING 05/19/2009  . LIPOMAS, MULTIPLE 09/26/2008  . Anxiety state 07/14/2008  . Dyslipidemia 07/11/2008  . FIBROCYSTIC BREAST DISEASE 06/08/2008  . Irritable bowel syndrome 08/27/2007  . GERD 08/26/2007  . CAD S/P percutaneous coronary angioplasty 07/10/2007  . Migraine, unspecified, without mention of intractable migraine without mention of status migrainosus 03/12/2007  . ACUT MI ANTEROLAT WALL SUBSQT EPIS CARE 03/12/2007  . ALLERGIC RHINITIS 03/12/2007  . ABDOMINAL BRUIT 03/12/2007  . Diverticulosis of large intestine 11/25/2006    Current Outpatient Prescriptions on File Prior  to Visit  Medication Sig Dispense Refill  . acetaminophen (TYLENOL) 650 MG CR tablet Take 1,300 mg by mouth 2 (two) times daily.    Marland Kitchen alendronate (FOSAMAX) 70 MG tablet TAKE 1 TAB ONCE A WEEK, AT LEAST 30 MIN BEFORE 1ST FOOD.DO NOT LIE DOWN FOR 30 MIN AFTER TAKING. 4 tablet 0  . aspirin EC 81 MG tablet Take 1 tablet (81 mg total) by mouth every other day.    Marland Kitchen BIOTIN PO Take by mouth.    Marland Kitchen buPROPion (WELLBUTRIN XL) 150 MG 24 hr tablet Take 75 mg by mouth daily.     . Calcium Carbonate (CALTRATE 600 PO) Take 1 tablet by mouth daily.    . Cholecalciferol (VITAMIN D3 PO) Take 1 tablet by mouth daily.    . clobetasol cream (TEMOVATE) [id number] % Apply 1 application topically once a week.     . cloNIDine (CATAPRES) 0.1 MG tablet Take 0.1 mg by mouth daily.     . Coenzyme Q10 (COQ10) 100 MG CAPS Take 1 capsule by mouth daily.     Marland Kitchen donepezil (ARICEPT) 10 MG tablet Take 10 mg by mouth every morning.    . Ginkgo Biloba 40 MG TABS Take by mouth.    . IRON PO Take by mouth.    . Lactase (LACTAID PO) Take 1 tablet by mouth as needed (Lactose Intolerance).    . loratadine (CLARITIN) 10 MG tablet Take 10 mg by mouth daily.    . Melatonin 5 MG TABS Take 1 tablet by mouth at bedtime.     Marland Kitchen  methylPREDNISolone acetate PF (DEPO-MEDROL) 40 MG/ML SUSP injection Every 6 months with Dr. Nelva Bush 1 mL   . Multiple Vitamins-Minerals (CENTRUM SILVER PO) Take by mouth.    . naproxen sodium (RA NAPROXEN SODIUM) 220 MG tablet Take 220 mg by mouth 2 (two) times daily with a meal.     . NEXIUM 40 MG capsule TAKE (1) CAPSULE DAILY. 30 capsule 0  . Potassium (POTASSIMIN PO) Take by mouth daily.    . Psyllium (METAMUCIL PO) Take by mouth. 4 by mouth daily    . QUEtiapine Fumarate (SEROQUEL XR) 150 MG 24 hr tablet Take 150 mg by mouth at bedtime.    . rosuvastatin (CRESTOR) 20 MG tablet Take 1 tablet (20 mg total) by mouth at bedtime. 90 tablet 3  . Simethicone (GAS-X PO) Take by mouth.    . Vitamin E (E200 PO) Take by mouth.      No current facility-administered medications on file prior to visit.     Past Medical History:  Diagnosis Date  . Abdominal bruit   . Abnormal weight gain   . Allergic rhinitis, cause unspecified   . CAD (coronary artery disease)    diaphragmatic wall infarction in 1999, treated with balloon angioplasty with nonobstructive disease at ast catheterization in 2007 as described above  . Colon polyp 12/20/2011   Tubular adenoma  . Depression   . Diverticular disease    acute diverticulitis 10/13/11  . Fibromyalgia   . GERD (gastroesophageal reflux disease)   . Hyperlipemia    with low HDL.  . IBS (irritable bowel syndrome)   . Migraine, unspecified, without mention of intractable migraine without mention of status migrainosus   . Tremor   . Unspecified adverse effect of unspecified drug, medicinal and biological substance     Past Surgical History:  Procedure Laterality Date  . ABDOMINAL HYSTERECTOMY  1979   partial  . ABDOMINAL SURGERY      @ [facility]  in [place] in 2002  for ptosis of organs; mersilene mesh sling, cystocopy  by Dr  Rosana Hoes, Urologist  . BLADDER SURGERY  patient unsure of date   sling  . CATARACT EXTRACTION     left eye  . COLONOSCOPY  2011   Diverticulosis & hemorrhoids; Dr Olevia Perches  . LAPAROSCOPIC SIGMOID COLECTOMY  02/26/2012   Procedure: LAPAROSCOPIC SIGMOID COLECTOMY;  Surgeon: Adin Hector, MD;  Location: [facility];  Service: General;  Laterality: N/A;  Laparoscopic Assisted Sigmoid Colectomy  . PARTIAL COLECTOMY  02/26/2012   Procedure: PARTIAL COLECTOMY;  Surgeon: Adin Hector, MD;  Location: [facility];  Service: General;  Laterality: N/A;  Sigmoid Colectomy  . PARTIAL HYSTERECTOMY    . TONSILLECTOMY  as child  . [place]    Social History   Social History  . Marital status: Married    Spouse name: N/A  . Number of children: N/A  . Years of education: N/A   Occupational History  . Retired Pharmacist, hospital    Social History Main Topics  .  Smoking status: Never Smoker  . Smokeless tobacco: Never Used     Comment: second hand smoke for decades  . Alcohol use No  . Drug use: No  . Sexual activity: Not on file   Other Topics Concern  . Not on file   Social History Narrative   High fiber diet          Family History  Problem Relation Age of Onset  . Heart attack  Father 60  . Ulcers Father   . Lung cancer Father   . Alcohol abuse Mother   . Heart attack Brother 73    1/2 BROTHER  . Bipolar disorder Sister   . Heart attack Sister 6  . Lymphoma Sister   . Stroke Sister     1/2 SISTER  . Other Brother     brain tumor  . Mental illness Son   . Colon cancer Neg Hx     Review of Systems  Constitutional: Negative for chills, fatigue and fever.  Eyes: Positive for visual disturbance (following with eye - corneal abrasion).  Respiratory: Negative for cough, shortness of breath and wheezing.   Cardiovascular: Negative for chest pain, palpitations and leg swelling.  Gastrointestinal: Negative for blood in stool, constipation, diarrhea and nausea.       No gerd  Genitourinary: Negative for dysuria and hematuria.  Musculoskeletal: Positive for arthralgias (mild arthritis) and back pain (intermittent back pain).  Skin: Negative for color change and rash.  Neurological: Negative for light-headedness and headaches.  Psychiatric/Behavioral: The patient is not nervous/anxious.        Objective:   Vitals:   06/10/16 1521  BP: 130/86  Pulse: 68  Resp: 16  Temp: 98.3 F (36.8 C)   Filed Weights   06/10/16 1521  Weight: 129 lb (58.5 kg)   Body mass index is 24.37 kg/m.  Wt Readings from Last 3 Encounters:  06/10/16 129 lb (58.5 kg)  03/21/16 123 lb (55.8 kg)  08/18/15 122 lb (55.3 kg)     Physical Exam Constitutional: She appears well-developed and well-nourished. No distress.  HENT:  Head: Normocephalic and atraumatic.  Right Ear: External ear normal. Normal ear canal and TM Left Ear: External ear  normal.  Normal ear canal and TM Mouth/Throat: Oropharynx is clear and moist.  Eyes: Conjunctivae and EOM are normal.  Neck: Neck supple. No tracheal deviation present. No thyromegaly present.  No carotid bruit  Cardiovascular: Normal rate, regular rhythm and normal heart sounds.   No murmur heard.  No edema. Pulmonary/Chest: Effort normal and breath sounds normal. No respiratory distress. She has no wheezes. She has no rales.  Breast: deferred to Gyn Abdominal: Soft. She exhibits no distension. There is no tenderness.  Lymphadenopathy: She has no cervical adenopathy.  Skin: Skin is warm and dry. She is not diaphoretic.  Psychiatric: She has a normal mood and affect. Her behavior is normal.        Assessment & Plan:   Physical exam: Screening blood work reviewed Immunizations - discussed shingles vaccine, pneumonia vaccines Colonoscopy  Up to date  -- due this year Mammogram  Up to date  72  - Up to date  Dexa  Up to date   Done 05/2015 Eye exams   Up to date  EKG  - done 03/2016 Exercise - regular  Weight   - working on weight loss Skin  - no concerns  Substance abuse   none  See Problem List for Assessment and Plan of chronic medical problems.  FU annually

## 2016-06-10 ENCOUNTER — Ambulatory Visit (INDEPENDENT_AMBULATORY_CARE_PROVIDER_SITE_OTHER): Payer: BLUE CROSS/BLUE SHIELD | Admitting: Internal Medicine

## 2016-06-10 ENCOUNTER — Encounter: Payer: Self-pay | Admitting: Internal Medicine

## 2016-06-10 VITALS — BP 130/86 | HR 68 | Temp 98.3°F | Resp 16 | Ht 61.0 in | Wt 129.0 lb

## 2016-06-10 DIAGNOSIS — I10 Essential (primary) hypertension: Secondary | ICD-10-CM

## 2016-06-10 DIAGNOSIS — M810 Age-related osteoporosis without current pathological fracture: Secondary | ICD-10-CM | POA: Diagnosis not present

## 2016-06-10 DIAGNOSIS — R7303 Prediabetes: Secondary | ICD-10-CM

## 2016-06-10 DIAGNOSIS — E785 Hyperlipidemia, unspecified: Secondary | ICD-10-CM | POA: Diagnosis not present

## 2016-06-10 DIAGNOSIS — K219 Gastro-esophageal reflux disease without esophagitis: Secondary | ICD-10-CM | POA: Diagnosis not present

## 2016-06-10 DIAGNOSIS — Z0000 Encounter for general adult medical examination without abnormal findings: Secondary | ICD-10-CM

## 2016-06-10 NOTE — Assessment & Plan Note (Signed)
Lipid panel controlled Continue statin 

## 2016-06-10 NOTE — Patient Instructions (Addendum)
All other Health Maintenance issues reviewed.   All recommended immunizations and age-appropriate screenings are up-to-date or discussed.  No immunizations administered today.   Medications reviewed and updated.  No changes recommended at this time.    Please followup in one year, sooner if needed   Health Maintenance, Female Adopting a healthy lifestyle and getting preventive care can go a long way to promote health and wellness. Talk with your health care provider about what schedule of regular examinations is right for you. This is a good chance for you to check in with your provider about disease prevention and staying healthy. In between checkups, there are plenty of things you can do on your own. Experts have done a lot of research about which lifestyle changes and preventive measures are most likely to keep you healthy. Ask your health care provider for more information. Weight and diet Eat a healthy diet  Be sure to include plenty of vegetables, fruits, low-fat dairy products, and lean protein.  Do not eat a lot of foods high in solid fats, added sugars, or salt.  Get regular exercise. This is one of the most important things you can do for your health.  Most adults should exercise for at least 150 minutes each week. The exercise should increase your heart rate and make you sweat (moderate-intensity exercise).  Most adults should also do strengthening exercises at least twice a week. This is in addition to the moderate-intensity exercise. Maintain a healthy weight  Body mass index (BMI) is a measurement that can be used to identify possible weight problems. It estimates body fat based on height and weight. Your health care provider can help determine your BMI and help you achieve or maintain a healthy weight.  For females 70 years of age and older:  A BMI below 18.5 is considered underweight.  A BMI of 18.5 to 24.9 is normal.  A BMI of 25 to 29.9 is considered  overweight.  A BMI of 30 and above is considered obese. Watch levels of cholesterol and blood lipids  You should start having your blood tested for lipids and cholesterol at 76 years of age, then have this test every 5 years.  You may need to have your cholesterol levels checked more often if:  Your lipid or cholesterol levels are high.  You are older than 76 years of age.  You are at high risk for heart disease. Cancer screening Lung Cancer  Lung cancer screening is recommended for adults 5-54 years old who are at high risk for lung cancer because of a history of smoking.  A yearly low-dose CT scan of the lungs is recommended for people who:  Currently smoke.  Have quit within the past 15 years.  Have at least a 30-pack-year history of smoking. A pack year is smoking an average of one pack of cigarettes a day for 1 year.  Yearly screening should continue until it has been 15 years since you quit.  Yearly screening should stop if you develop a health problem that would prevent you from having lung cancer treatment. Breast Cancer  Practice breast self-awareness. This means understanding how your breasts normally appear and feel.  It also means doing regular breast self-exams. Let your health care provider know about any changes, no matter how small.  If you are in your 20s or 30s, you should have a clinical breast exam (CBE) by a health care provider every 1-3 years as part of a regular health exam.  If you are 40 or older, have a CBE every year. Also consider having a breast X-ray (mammogram) every year.  If you have a family history of breast cancer, talk to your health care provider about genetic screening.  If you are at high risk for breast cancer, talk to your health care provider about having an MRI and a mammogram every year.  Breast cancer gene (BRCA) assessment is recommended for women who have family members with BRCA-related cancers. BRCA-related cancers  include:  Breast.  Ovarian.  Tubal.  Peritoneal cancers.  Results of the assessment will determine the need for genetic counseling and BRCA1 and BRCA2 testing. Cervical Cancer  Your health care provider may recommend that you be screened regularly for cancer of the pelvic organs (ovaries, uterus, and vagina). This screening involves a pelvic examination, including checking for microscopic changes to the surface of your cervix (Pap test). You may be encouraged to have this screening done every 3 years, beginning at age 20.  For women ages 9-65, health care providers may recommend pelvic exams and Pap testing every 3 years, or they may recommend the Pap and pelvic exam, combined with testing for human papilloma virus (HPV), every 5 years. Some types of HPV increase your risk of cervical cancer. Testing for HPV may also be done on women of any age with unclear Pap test results.  Other health care providers may not recommend any screening for nonpregnant women who are considered low risk for pelvic cancer and who do not have symptoms. Ask your health care provider if a screening pelvic exam is right for you.  If you have had past treatment for cervical cancer or a condition that could lead to cancer, you need Pap tests and screening for cancer for at least 20 years after your treatment. If Pap tests have been discontinued, your risk factors (such as having a new sexual partner) need to be reassessed to determine if screening should resume. Some women have medical problems that increase the chance of getting cervical cancer. In these cases, your health care provider may recommend more frequent screening and Pap tests. Colorectal Cancer  This type of cancer can be detected and often prevented.  Routine colorectal cancer screening usually begins at 75 years of age and continues through 76 years of age.  Your health care provider may recommend screening at an earlier age if you have risk factors  for colon cancer.  Your health care provider may also recommend using home test kits to check for hidden blood in the stool.  A small camera at the end of a tube can be used to examine your colon directly (sigmoidoscopy or colonoscopy). This is done to check for the earliest forms of colorectal cancer.  Routine screening usually begins at age 88.  Direct examination of the colon should be repeated every 5-10 years through 76 years of age. However, you may need to be screened more often if early forms of precancerous polyps or small growths are found. Skin Cancer  Check your skin from head to toe regularly.  Tell your health care provider about any new moles or changes in moles, especially if there is a change in a mole's shape or color.  Also tell your health care provider if you have a mole that is larger than the size of a pencil eraser.  Always use sunscreen. Apply sunscreen liberally and repeatedly throughout the day.  Protect yourself by wearing long sleeves, pants, a wide-brimmed hat, and sunglasses whenever  you are outside. Heart disease, diabetes, and high blood pressure  High blood pressure causes heart disease and increases the risk of stroke. High blood pressure is more likely to develop in:  People who have blood pressure in the high end of the normal range (130-139/85-89 mm Hg).  People who are overweight or obese.  People who are African American.  If you are 56-57 years of age, have your blood pressure checked every 3-5 years. If you are 70 years of age or older, have your blood pressure checked every year. You should have your blood pressure measured twice-once when you are at a hospital or clinic, and once when you are not at a hospital or clinic. Record the average of the two measurements. To check your blood pressure when you are not at a hospital or clinic, you can use:  An automated blood pressure machine at a pharmacy.  A home blood pressure monitor.  If you  are between 70 years and 27 years old, ask your health care provider if you should take aspirin to prevent strokes.  Have regular diabetes screenings. This involves taking a blood sample to check your fasting blood sugar level.  If you are at a normal weight and have a low risk for diabetes, have this test once every three years after 76 years of age.  If you are overweight and have a high risk for diabetes, consider being tested at a younger age or more often. Preventing infection Hepatitis B  If you have a higher risk for hepatitis B, you should be screened for this virus. You are considered at high risk for hepatitis B if:  You were born in a country where hepatitis B is common. Ask your health care provider which countries are considered high risk.  Your parents were born in a high-risk country, and you have not been immunized against hepatitis B (hepatitis B vaccine).  You have HIV or AIDS.  You use needles to inject street drugs.  You live with someone who has hepatitis B.  You have had sex with someone who has hepatitis B.  You get hemodialysis treatment.  You take certain medicines for conditions, including cancer, organ transplantation, and autoimmune conditions. Hepatitis C  Blood testing is recommended for:  Everyone born from [birth year] through 1965.  Anyone with known risk factors for hepatitis C. Sexually transmitted infections (STIs)  You should be screened for sexually transmitted infections (STIs) including gonorrhea and chlamydia if:  You are sexually active and are younger than 76 years of age.  You are older than 76 years of age and your health care provider tells you that you are at risk for this type of infection.  Your sexual activity has changed since you were last screened and you are at an increased risk for chlamydia or gonorrhea. Ask your health care provider if you are at risk.  If you do not have HIV, but are at risk, it may be recommended that you  take a prescription medicine daily to prevent HIV infection. This is called pre-exposure prophylaxis (PrEP). You are considered at risk if:  You are sexually active and do not regularly use condoms or know the HIV status of your partner(s).  You take drugs by injection.  You are sexually active with a partner who has HIV. Talk with your health care provider about whether you are at high risk of being infected with HIV. If you choose to begin PrEP, you should first be tested for HIV.  You should then be tested every 3 months for as long as you are taking PrEP. Pregnancy  If you are premenopausal and you may become pregnant, ask your health care provider about preconception counseling.  If you may become pregnant, take 400 to 800 micrograms (mcg) of folic acid every day.  If you want to prevent pregnancy, talk to your health care provider about birth control (contraception). Osteoporosis and menopause  Osteoporosis is a disease in which the bones lose minerals and strength with aging. This can result in serious bone fractures. Your risk for osteoporosis can be identified using a bone density scan.  If you are 58 years of age or older, or if you are at risk for osteoporosis and fractures, ask your health care provider if you should be screened.  Ask your health care provider whether you should take a calcium or vitamin D supplement to lower your risk for osteoporosis.  Menopause may have certain physical symptoms and risks.  Hormone replacement therapy may reduce some of these symptoms and risks. Talk to your health care provider about whether hormone replacement therapy is right for you. Follow these instructions at home:  Schedule regular health, dental, and eye exams.  Stay current with your immunizations.  Do not use any tobacco products including cigarettes, chewing tobacco, or electronic cigarettes.  If you are pregnant, do not drink alcohol.  If you are breastfeeding, limit  how much and how often you drink alcohol.  Limit alcohol intake to no more than 1 drink per day for nonpregnant women. One drink equals 12 ounces of beer, 5 ounces of wine, or 1 ounces of hard liquor.  Do not use street drugs.  Do not share needles.  Ask your health care provider for help if you need support or information about quitting drugs.  Tell your health care provider if you often feel depressed.  Tell your health care provider if you have ever been abused or do not feel safe at home. This information is not intended to replace advice given to you by your health care provider. Make sure you discuss any questions you have with your health care provider. Document Released: 10/08/2010 Document Revised: 08/31/2015 Document Reviewed: 12/27/2014 Elsevier Interactive Patient Education  2017 [employer].

## 2016-06-10 NOTE — Assessment & Plan Note (Signed)
Taking fosamax weekly - continue for another year  Taking calcium/vitamin d exercising

## 2016-06-10 NOTE — Progress Notes (Signed)
Pre visit review using our clinic review tool, if applicable. No additional management support is needed unless otherwise documented below in the visit note. 

## 2016-06-10 NOTE — Assessment & Plan Note (Signed)
BP well controlled Current regimen effective and well tolerated Continue current medications at current doses  

## 2016-06-10 NOTE — Assessment & Plan Note (Signed)
GERD controlled Continue daily medication  

## 2016-06-10 NOTE — Assessment & Plan Note (Signed)
a1c 5.7% Continue regular exercise Work on weight loss Low sugar/carb diet

## 2016-06-13 ENCOUNTER — Encounter: Payer: Self-pay | Admitting: Internal Medicine

## 2016-06-16 ENCOUNTER — Encounter: Payer: Self-pay | Admitting: Internal Medicine

## 2016-06-16 DIAGNOSIS — M8580 Other specified disorders of bone density and structure, unspecified site: Secondary | ICD-10-CM | POA: Insufficient documentation

## 2016-06-24 ENCOUNTER — Encounter: Payer: Self-pay | Admitting: Internal Medicine

## 2016-07-08 ENCOUNTER — Other Ambulatory Visit: Payer: Self-pay | Admitting: Internal Medicine

## 2016-07-08 ENCOUNTER — Other Ambulatory Visit: Payer: Self-pay | Admitting: Cardiology

## 2016-08-07 ENCOUNTER — Encounter: Payer: Self-pay | Admitting: Emergency Medicine

## 2016-08-07 ENCOUNTER — Ambulatory Visit (INDEPENDENT_AMBULATORY_CARE_PROVIDER_SITE_OTHER): Payer: Commercial Managed Care - PPO | Admitting: Internal Medicine

## 2016-08-07 ENCOUNTER — Encounter: Payer: Self-pay | Admitting: Internal Medicine

## 2016-08-07 DIAGNOSIS — E222 Syndrome of inappropriate secretion of antidiuretic hormone: Secondary | ICD-10-CM

## 2016-08-07 NOTE — Progress Notes (Signed)
Subjective:    Patient ID: [name], female    DOB: 1940/10/07, 76 y.o.   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    It was either from surgery itself or medications - anesthesia/ chronic meds.  She has brought records from prior surgeries for me to review.   Her granddaughter is going to have surgery and that has caused this issue to become more important.  She is also concerned if she needs to have surgery again in the future.   Medications and allergies reviewed with patient and updated if appropriate.  Patient Active Problem List   Diagnosis Date Noted  . Osteopenia 06/16/2016  . Prediabetes 06/10/2016  . Anesthesia complication 33/82/5053  . Abnormal CT scan, lung 08/11/15 08/19/2015  . SIADH (syndrome of inappropriate ADH production) ([place]) 08/19/2015  . Cough 08/19/2015  . Hyponatremia 08/19/2015  . S/P herniorrhaphy 08/01/2015  . Corneal abrasion, left 06/08/2015  . Ventral incisional hernia 06/04/2015  . Fatigue 05/14/2013  . Osteoporosis 05/17/2012  . Acute delirium 02/28/2012  . Hypertension 02/28/2012  . 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 Diverticulitis 10/24/2011  . Pulmonic insufficiency 08/27/2010  . TREMOR, ESSENTIAL 06/20/2010  . CARDIOMEGALY, MILD 02/01/2010  . UNSPECIFIED VITAMIN D DEFICIENCY 01/16/2010  . HYPERGLYCEMIA, FASTING 05/19/2009  . LIPOMAS, MULTIPLE 09/26/2008  . Anxiety state 07/14/2008  . Dyslipidemia 07/11/2008  . FIBROCYSTIC BREAST DISEASE 06/08/2008  . Irritable bowel syndrome 08/27/2007  . GERD 08/26/2007  . CAD S/P percutaneous coronary angioplasty 07/10/2007  . Migraine, unspecified, without mention of intractable migraine without mention of status migrainosus 03/12/2007  . ACUT MI ANTEROLAT WALL SUBSQT EPIS CARE 03/12/2007  . ALLERGIC RHINITIS 03/12/2007  . ABDOMINAL BRUIT 03/12/2007  . Diverticulosis of large intestine  11/25/2006    Current Outpatient Prescriptions on File Prior to Visit  Medication Sig Dispense Refill  . acetaminophen (TYLENOL) 650 MG CR tablet Take 1,300 mg by mouth 2 (two) times daily.    Marland Kitchen alendronate (FOSAMAX) 70 MG tablet TAKE 1 TAB ONCE A WEEK, AT LEAST 30 MIN BEFORE 1ST FOOD.DO NOT LIE DOWN FOR 30 MIN AFTER TAKING. 4 tablet 2  . aspirin EC 81 MG tablet Take 1 tablet (81 mg total) by mouth every other day.    Marland Kitchen BIOTIN PO Take by mouth.    Marland Kitchen buPROPion (WELLBUTRIN XL) 150 MG 24 hr tablet Take 75 mg by mouth daily.     . Calcium Carbonate (CALTRATE 600 PO) Take 1 tablet by mouth daily.    . Cholecalciferol (VITAMIN D3 PO) Take 1 tablet by mouth daily.    . clobetasol cream (TEMOVATE) 9.76 % Apply 1 application topically once a week.     . cloNIDine (CATAPRES) 0.1 MG tablet Take 0.1 mg by mouth daily.     . Coenzyme Q10 (COQ10) 100 MG CAPS Take 1 capsule by mouth daily.     Marland Kitchen donepezil (ARICEPT) 10 MG tablet Take 10 mg by mouth every morning.    . Ginkgo Biloba 40 MG TABS Take by mouth.    . IRON PO Take by mouth.    . Lactase (LACTAID PO) Take 1 tablet by mouth as needed (Lactose Intolerance).    . loratadine (CLARITIN) 10 MG tablet Take 10 mg by mouth daily.    . Melatonin 5 MG TABS Take 1 tablet by mouth at bedtime.     . methylPREDNISolone  acetate PF (DEPO-MEDROL) 40 MG/ML SUSP injection Every 6 months with Dr. Nelva Bush 1 mL   . Multiple Vitamins-Minerals (CENTRUM SILVER PO) Take by mouth.    . naproxen sodium (RA NAPROXEN SODIUM) 220 MG tablet Take 220 mg by mouth 2 (two) times daily with a meal.     . NEXIUM 40 MG capsule TAKE (1) CAPSULE DAILY. 30 capsule 0  . Potassium (POTASSIMIN PO) Take by mouth daily.    . Psyllium (METAMUCIL PO) Take by mouth. 4 by mouth daily    . QUEtiapine Fumarate (SEROQUEL XR) 150 MG 24 hr tablet Take 150 mg by mouth at bedtime.    . rosuvastatin (CRESTOR) 20 MG tablet TAKE ONE TABLET AT BEDTIME. 30 tablet 3  . Simethicone (GAS-X PO) Take by mouth.      . Vitamin E (E200 PO) Take by mouth.     No current facility-administered medications on file prior to visit.     Past Medical History:  Diagnosis Date  . Abdominal bruit   . Abnormal weight gain   . Allergic rhinitis, cause unspecified   . CAD (coronary artery disease)    diaphragmatic wall infarction in 1999, treated with balloon angioplasty with nonobstructive disease at ast catheterization in 2007 as described above  . Colon polyp 12/20/2011   Tubular adenoma  . Depression   . Diverticular disease    acute diverticulitis 10/13/11  . Fibromyalgia   . GERD (gastroesophageal reflux disease)   . Hyperlipemia    with low HDL.  . IBS (irritable bowel syndrome)   . Migraine, unspecified, without mention of intractable migraine without mention of status migrainosus   . Tremor   . Unspecified adverse effect of unspecified drug, medicinal and biological substance     Past Surgical History:  Procedure Laterality Date  . ABDOMINAL HYSTERECTOMY  1979   partial  . ABDOMINAL SURGERY      @ [facility]  in [place] in 2002  for ptosis of organs; mersilene mesh sling, cystocopy  by Dr  Rosana Hoes, Urologist  . BLADDER SURGERY  patient unsure of date   sling  . CATARACT EXTRACTION     left eye  . COLONOSCOPY  2011   Diverticulosis & hemorrhoids; Dr Olevia Perches  . LAPAROSCOPIC SIGMOID COLECTOMY  02/26/2012   Procedure: LAPAROSCOPIC SIGMOID COLECTOMY;  Surgeon: Adin Hector, MD;  Location: [facility];  Service: General;  Laterality: N/A;  Laparoscopic Assisted Sigmoid Colectomy  . PARTIAL COLECTOMY  02/26/2012   Procedure: PARTIAL COLECTOMY;  Surgeon: Adin Hector, MD;  Location: [facility];  Service: General;  Laterality: N/A;  Sigmoid Colectomy  . PARTIAL HYSTERECTOMY    . TONSILLECTOMY  as child  . [place]    Social History   Social History  . Marital status: Married    Spouse name: N/A  . Number of children: N/A  . Years of education: N/A   Occupational History  . Retired  Pharmacist, hospital    Social History Main Topics  . Smoking status: Never Smoker  . Smokeless tobacco: Never Used     Comment: second hand smoke for decades  . Alcohol use No  . Drug use: No  . Sexual activity: Not on file   Other Topics Concern  . Not on file   Social History Narrative   High fiber diet          Family History  Problem Relation Age of Onset  . Heart attack Father 53  . Ulcers  Father   . Lung cancer Father   . Alcohol abuse Mother   . Heart attack Brother 78    1/2 BROTHER  . Bipolar disorder Sister   . Heart attack Sister 17  . Lymphoma Sister   . Stroke Sister     1/2 SISTER  . Other Brother     brain tumor  . Mental illness Son   . Colon cancer Neg Hx     Review of Systems     Objective:   Vitals:   08/07/16 1511  BP: (!) 142/90  Pulse: 63  Resp: 16  Temp: 98.1 F (36.7 C)   Filed Weights   08/07/16 1511  Weight: 134 lb (60.8 kg)   Body mass index is 25.32 kg/m.  Wt Readings from Last 3 Encounters:  08/07/16 134 lb (60.8 kg)  06/10/16 129 lb (58.5 kg)  03/21/16 123 lb (55.8 kg)     Physical Exam        Assessment & Plan:    20 minutes were spent face-to-face with the patient, over 50% of which was spent reviewing surgical records and discussing her episodes of SIADH.  we reviewed probable causes and and ways we can avoid this in the future is she ever needs surgery.  Discussed precautions her family should take.    See Problem List for Assessment and Plan of chronic medical problems.

## 2016-08-07 NOTE — Progress Notes (Signed)
Pre visit review using our clinic review tool, if applicable. No additional management support is needed unless otherwise documented below in the visit note. 

## 2016-08-07 NOTE — Patient Instructions (Addendum)
As a complication of surgery you have a history of SIADH ( syndrome of inappropriate Anti-Diuretic Hormone production) due to surgery itself and/or psych medications/pain medications/anesthesia.  Hyponatremia was severe to 104 on post-op day 5.

## 2016-08-08 ENCOUNTER — Encounter: Payer: Self-pay | Admitting: Internal Medicine

## 2016-08-08 NOTE — Assessment & Plan Note (Addendum)
Three episodes of SIADH post surgery -- ? Related to surgery itself or combination of anesthesia/ chronic medications Symptoms started a few days post-op - sodium has gotten down to 104 Avoid unnecessary surgery  - is she does need surgery will need very close monitoring of blood work for several days post - op

## 2016-08-15 ENCOUNTER — Encounter: Payer: Self-pay | Admitting: Internal Medicine

## 2016-09-30 ENCOUNTER — Other Ambulatory Visit: Payer: Self-pay | Admitting: Internal Medicine

## 2016-10-30 ENCOUNTER — Telehealth (HOSPITAL_COMMUNITY): Payer: Self-pay

## 2016-10-30 NOTE — Telephone Encounter (Signed)
I see her husband in CHF clinic, can see her here also since I see him.  She can start lisinopril 10 mg daily with BMET in 10 days.

## 2016-10-30 NOTE — Telephone Encounter (Signed)
Patient calling CHF clinic triage line and left message to report higher BP than normal over past few days of 145/90s. Wonderinf Dr. Aundra Dubin should address this (used to see at [facility] for general cardiology) or if she should address with PCP? Also wondering who Dr. Aundra Dubin can refer her to for continued general cardiology care?  Will forward to Dr. Aundra Dubin to advise.  Renee Pain, RN

## 2016-11-01 ENCOUNTER — Telehealth (HOSPITAL_COMMUNITY): Payer: Self-pay | Admitting: Cardiology

## 2016-11-01 NOTE — Telephone Encounter (Signed)
Attempted to call a few times, phone keeps disconnecting as soon as patient picks up. Will continue to try to reach patient. Will also schedule patient and husband for routine follow up as they are both past due.  Renee Pain, RN

## 2016-11-01 NOTE — Telephone Encounter (Signed)
LVM for pt to call back.  Per staff messge from Vilinda Blanks, RN, need to schedule pt and spouse for appts with Dr. Aundra Dubin, next available.

## 2016-11-06 NOTE — Telephone Encounter (Signed)
Patient returned my call.  She and spouse would like to come in January 2018 to see Dr. Aundra Dubin.  Pt aware we do not have the January schedule set up and she will call back in November to set up their appts.

## 2016-11-08 NOTE — Telephone Encounter (Signed)
Patient still unable to be reached by phone.  Will attempt again Monday and if no answer will mail letter to contact our office.  Renee Pain, RN

## 2016-11-15 NOTE — Telephone Encounter (Signed)
Patient returned call to clinic.  States her BP has since normalized and would like to continue monitoring BP for now before starting lisinopril. Advised per Dr. Aundra Dubin she needs f/u apt. Patient out of town and will call our office to schedule once she returns.  Renee Pain, RN

## 2016-11-26 ENCOUNTER — Encounter: Payer: Self-pay | Admitting: Gastroenterology

## 2016-11-27 ENCOUNTER — Telehealth: Payer: Self-pay | Admitting: Internal Medicine

## 2016-11-27 NOTE — Telephone Encounter (Signed)
Dr Carlean Purl, Dr Hilarie Fredrickson or Dr Henrene Pastor

## 2016-11-27 NOTE — Telephone Encounter (Signed)
Pt called in and wanted to know who Dr Quay Burow  Suggest do her colonoscopy?

## 2016-11-29 NOTE — Telephone Encounter (Signed)
Spoke with pt to inform.  

## 2016-12-02 ENCOUNTER — Telehealth: Payer: Self-pay | Admitting: Internal Medicine

## 2016-12-02 NOTE — Telephone Encounter (Signed)
OV scheduled.  °

## 2016-12-02 NOTE — Telephone Encounter (Signed)
yes

## 2016-12-20 ENCOUNTER — Other Ambulatory Visit: Payer: Self-pay | Admitting: Physical Medicine and Rehabilitation

## 2016-12-20 DIAGNOSIS — M546 Pain in thoracic spine: Secondary | ICD-10-CM

## 2017-01-19 ENCOUNTER — Other Ambulatory Visit: Payer: Self-pay | Admitting: Internal Medicine

## 2017-01-20 ENCOUNTER — Other Ambulatory Visit: Payer: Self-pay | Admitting: Internal Medicine

## 2017-02-05 ENCOUNTER — Ambulatory Visit (INDEPENDENT_AMBULATORY_CARE_PROVIDER_SITE_OTHER): Payer: Commercial Managed Care - PPO | Admitting: Internal Medicine

## 2017-02-05 ENCOUNTER — Encounter: Payer: Self-pay | Admitting: Internal Medicine

## 2017-02-05 VITALS — BP 130/60 | HR 78 | Ht 61.0 in | Wt 136.0 lb

## 2017-02-05 DIAGNOSIS — Z86010 Personal history of colonic polyps: Secondary | ICD-10-CM

## 2017-02-05 DIAGNOSIS — E222 Syndrome of inappropriate secretion of antidiuretic hormone: Secondary | ICD-10-CM

## 2017-02-05 NOTE — Patient Instructions (Signed)
You have been scheduled for a colonoscopy. Please follow written instructions given to you at your visit today.  Please pick up your prep supplies at the pharmacy. If you use inhalers (even only as needed), please bring them with you on the day of your procedure.   I appreciate the opportunity to care for you. Carl Gessner, MD, FACG 

## 2017-02-05 NOTE — Progress Notes (Deleted)
[name] 76 y.o. 08-Dec-1940 [id number]  Assessment & Plan:      Subjective:   Chief Complaint:  HPI  Allergies  Allergen Reactions  . Amphetamine-Dextroamphet Er Other (See Comments)    unknowb reaction  . Hydromorphone Shortness Of Breath    BREATHING AND CARDIAC ISSUES  . Metronidazole Other (See Comments)    Unknown REACTION: SORES IN MOUTH, severe diarrhea  . Penicillins     RASH  . Thorazine [Chlorpromazine] Other (See Comments)    " had a total hepatic wipeout" in 1974  . Bupivacaine Other (See Comments)    Unknown  . Atorvastatin Other (See Comments)    Upset stomach  . Azithromycin     Unknown reaction  . Cephalexin     Unknown reaction  . Chlorpromazine Hcl     Unknown reaction  . Ciprofloxacin     REACTION: SEVERE PAIN,DEHYDRATION,DIARRHEA  . Doxycycline Diarrhea  . Ezetimibe     REACTION: JOINT/MUSCLE ACHES  . Hyoscyamine Sulfate     Unknown reaction  . Lactose Intolerance (Gi) Diarrhea  . Lidocaine Swelling  . Oatmeal Other (See Comments)     Sinus Congestion   . Sulfamethoxazole     REACTION: GI upset-acid reflux  . Telithromycin     Unknown reaction  . Topiramate     Unknown reaction  . Latex Rash   Current Meds  Medication Sig  . acetaminophen (TYLENOL) 650 MG CR tablet Take 1,300 mg by mouth 2 (two) times daily.  Marland Kitchen alendronate (FOSAMAX) 70 MG tablet TAKE 1 TAB ONCE A WEEK, AT LEAST 30 MIN BEFORE 1ST FOOD.DO NOT LIE DOWN FOR 30 MIN AFTER TAKING.  Marland Kitchen aspirin EC 81 MG tablet Take 1 tablet (81 mg total) by mouth every other day.  Marland Kitchen BIOTIN PO Take by mouth.  . Calcium Carbonate (CALTRATE 600 PO) Take 1 tablet by mouth daily.  . Cholecalciferol (VITAMIN D3 PO) Take 1 tablet by mouth daily.  . clobetasol cream (TEMOVATE) 1.88 % Apply 1 application topically once a week.   . cloNIDine (CATAPRES) 0.1 MG tablet Take 0.1 mg by mouth daily.   . Coenzyme Q10 (COQ10) 100 MG CAPS Take 1 capsule by mouth daily.   Marland Kitchen donepezil (ARICEPT) 10 MG  tablet Take 10 mg by mouth every morning.  . Ginkgo Biloba 40 MG TABS Take by mouth.  . IRON PO Take by mouth.  . Lactase (LACTAID PO) Take 1 tablet by mouth as needed (Lactose Intolerance).  . loratadine (CLARITIN) 10 MG tablet Take 10 mg by mouth daily.  . Melatonin 5 MG TABS Take 1 tablet by mouth at bedtime.   . methylPREDNISolone acetate PF (DEPO-MEDROL) 40 MG/ML SUSP injection Every 6 months with Dr. Nelva Bush  . Multiple Vitamins-Minerals (CENTRUM SILVER PO) Take by mouth.  . naproxen sodium (RA NAPROXEN SODIUM) 220 MG tablet Take 220 mg by mouth 2 (two) times daily with a meal.   . NEXIUM 40 MG capsule TAKE (1) CAPSULE DAILY.  Marland Kitchen Potassium (POTASSIMIN PO) Take by mouth daily.  . Psyllium (METAMUCIL PO) Take by mouth. 4 by mouth daily  . QUEtiapine Fumarate (SEROQUEL XR) 150 MG 24 hr tablet Take 150 mg by mouth at bedtime.  . rosuvastatin (CRESTOR) 20 MG tablet TAKE ONE TABLET AT BEDTIME.  . Simethicone (GAS-X PO) Take by mouth.  . Vitamin E (E200 PO) Take by mouth.   Past Medical History:  Diagnosis Date  . Abdominal bruit   . Abnormal weight gain   .  Allergic rhinitis, cause unspecified   . CAD (coronary artery disease)    diaphragmatic wall infarction in 1999, treated with balloon angioplasty with nonobstructive disease at ast catheterization in 2007 as described above  . Colon polyp 12/20/2011   Tubular adenoma  . Depression   . Diverticular disease    acute diverticulitis 10/13/11  . Fibromyalgia   . GERD (gastroesophageal reflux disease)   . Hyperlipemia    with low HDL.  . IBS (irritable bowel syndrome)   . Migraine, unspecified, without mention of intractable migraine without mention of status migrainosus   . Tremor   . Unspecified adverse effect of unspecified drug, medicinal and biological substance   . Ventral incisional hernia 06/04/2015   Past Surgical History:  Procedure Laterality Date  . ABDOMINAL HYSTERECTOMY  1979   partial  . ABDOMINAL SURGERY      @ [facility]   in [place] in 2002  for ptosis of organs; mersilene mesh sling, cystocopy  by Dr  Rosana Hoes, Urologist  . BLADDER SURGERY  patient unsure of date   sling  . CATARACT EXTRACTION     left eye  . COLONOSCOPY  2011   Diverticulosis & hemorrhoids; Dr Olevia Perches  . LAPAROSCOPIC SIGMOID COLECTOMY  02/26/2012   Procedure: LAPAROSCOPIC SIGMOID COLECTOMY;  Surgeon: Adin Hector, MD;  Location: [facility];  Service: General;  Laterality: N/A;  Laparoscopic Assisted Sigmoid Colectomy  . PARTIAL COLECTOMY  02/26/2012   Procedure: PARTIAL COLECTOMY;  Surgeon: Adin Hector, MD;  Location: [facility];  Service: General;  Laterality: N/A;  Sigmoid Colectomy  . PARTIAL HYSTERECTOMY    . TONSILLECTOMY  as child  . [place]   Social History   Social History  . Marital status: Married    Spouse name: N/A  . Number of children: N/A  . Years of education: N/A   Occupational History  . Retired Pharmacist, hospital    Social History Main Topics  . Smoking status: Never Smoker  . Smokeless tobacco: Never Used     Comment: second hand smoke for decades  . Alcohol use No  . Drug use: No  . Sexual activity: Not on file   Other Topics Concern  . Not on file   Social History Narrative   High fiber diet         family history includes Alcohol abuse in her mother; Bipolar disorder in her sister; Heart attack (age of onset: 2) in her sister; Heart attack (age of onset: 52) in her brother; Heart attack (age of onset: 31) in her father; Lung cancer in her father; Lymphoma in her sister; Mental illness in her son; Other in her brother; Stroke in her sister; Ulcers in her father.   Review of Systems   Objective:   Physical Exam  BP 130/60   Pulse 78   Ht 5\' 1"  (1.549 m)   Wt 136 lb (61.7 kg)   BMI 25.70 kg/m

## 2017-02-05 NOTE — Progress Notes (Signed)
[name] 76 y.o. [birth year] [id number]  Assessment & Plan:   Encounter Diagnoses  Name Primary?  Marland Kitchen Hx of adenomatous colonic polyps Yes  . SIADH (syndrome of inappropriate ADH production) ([place])     Hx adenoma 2011. Appropriate to repeat colonoscopy She is concerned about SIADH and hyponatremia possibly as major surgery has triggered I don't thing colonoscopy will  The risks and benefits as well as alternatives of endoscopic procedure(s) have been discussed and reviewed. All questions answered. The patient agrees to proceed.  I appreciate the opportunity to care for this patient.  GU:YQIHK, Claudina Lick, MD    Subjective:   Chief Complaint: hx adenomatous colon polyp  HPI 2011 hx adenomatous colon polyp dr. Olevia Perches then a repeat colonoscopy due to thickened left colon 2013 w/ divertculosis. Then had segmental resection. Allergies  Allergen Reactions  . Amphetamine-Dextroamphet Er Other (See Comments)    unknowb reaction  . Hydromorphone Shortness Of Breath    BREATHING AND CARDIAC ISSUES  . Metronidazole Other (See Comments)    Unknown REACTION: SORES IN MOUTH, severe diarrhea  . Penicillins     RASH  . Thorazine [Chlorpromazine] Other (See Comments)    " had a total hepatic wipeout" in 1974  . Bupivacaine Other (See Comments)    Unknown  . Atorvastatin Other (See Comments)    Upset stomach  . Azithromycin     Unknown reaction  . Cephalexin     Unknown reaction  . Chlorpromazine Hcl     Unknown reaction  . Ciprofloxacin     REACTION: SEVERE PAIN,DEHYDRATION,DIARRHEA  . Doxycycline Diarrhea  . Ezetimibe     REACTION: JOINT/MUSCLE ACHES  . Hyoscyamine Sulfate     Unknown reaction  . Lactose Intolerance (Gi) Diarrhea  . Lidocaine Swelling  . Oatmeal Other (See Comments)     Sinus Congestion   . Sulfamethoxazole     REACTION: GI upset-acid reflux  . Telithromycin     Unknown reaction  . Topiramate     Unknown reaction  . Latex Rash   Current  Meds  Medication Sig  . acetaminophen (TYLENOL) 650 MG CR tablet Take 1,300 mg by mouth 2 (two) times daily.  Marland Kitchen alendronate (FOSAMAX) 70 MG tablet TAKE 1 TAB ONCE A WEEK, AT LEAST 30 MIN BEFORE 1ST FOOD.DO NOT LIE DOWN FOR 30 MIN AFTER TAKING.  Marland Kitchen aspirin EC 81 MG tablet Take 1 tablet (81 mg total) by mouth every other day.  Marland Kitchen BIOTIN PO Take by mouth.  . Calcium Carbonate (CALTRATE 600 PO) Take 1 tablet by mouth daily.  . Cholecalciferol (VITAMIN D3 PO) Take 1 tablet by mouth daily.  . clobetasol cream (TEMOVATE) 7.42 % Apply 1 application topically once a week.   . cloNIDine (CATAPRES) 0.1 MG tablet Take 0.1 mg by mouth daily.   . Coenzyme Q10 (COQ10) 100 MG CAPS Take 1 capsule by mouth daily.   Marland Kitchen donepezil (ARICEPT) 10 MG tablet Take 10 mg by mouth every morning.  . Ginkgo Biloba 40 MG TABS Take by mouth.  . IRON PO Take by mouth.  . Lactase (LACTAID PO) Take 1 tablet by mouth as needed (Lactose Intolerance).  . loratadine (CLARITIN) 10 MG tablet Take 10 mg by mouth daily.  . Melatonin 5 MG TABS Take 1 tablet by mouth at bedtime.   . methylPREDNISolone acetate PF (DEPO-MEDROL) 40 MG/ML SUSP injection Every 6 months with Dr. Nelva Bush  . Multiple Vitamins-Minerals (CENTRUM SILVER PO) Take by mouth.  . naproxen  sodium (RA NAPROXEN SODIUM) 220 MG tablet Take 220 mg by mouth 2 (two) times daily with a meal.   . NEXIUM 40 MG capsule TAKE (1) CAPSULE DAILY.  Marland Kitchen Potassium (POTASSIMIN PO) Take by mouth daily.  . Psyllium (METAMUCIL PO) Take by mouth. 4 by mouth daily  . QUEtiapine Fumarate (SEROQUEL XR) 150 MG 24 hr tablet Take 150 mg by mouth at bedtime.  . rosuvastatin (CRESTOR) 20 MG tablet TAKE ONE TABLET AT BEDTIME.  . Simethicone (GAS-X PO) Take by mouth.  . Vitamin E (E200 PO) Take by mouth.   Past Medical History:  Diagnosis Date  . Abdominal bruit   . Abnormal weight gain   . Allergic rhinitis, cause unspecified   . CAD (coronary artery disease)    diaphragmatic wall infarction in  1999, treated with balloon angioplasty with nonobstructive disease at ast catheterization in 2007 as described above  . Colon polyp 12/20/2011   Tubular adenoma  . Depression   . Diverticular disease    acute diverticulitis 10/13/11  . Fibromyalgia   . GERD (gastroesophageal reflux disease)   . Hyperlipemia    with low HDL.  . IBS (irritable bowel syndrome)   . Migraine, unspecified, without mention of intractable migraine without mention of status migrainosus   . Tremor   . Unspecified adverse effect of unspecified drug, medicinal and biological substance   . Ventral incisional hernia 06/04/2015   Past Surgical History:  Procedure Laterality Date  . ABDOMINAL HYSTERECTOMY  1979   partial  . ABDOMINAL SURGERY      @ [facility]  in [place] in 2002  for ptosis of organs; mersilene mesh sling, cystocopy  by Dr  Rosana Hoes, Urologist  . BLADDER SURGERY  patient unsure of date   sling  . CATARACT EXTRACTION     left eye  . COLONOSCOPY  2011   Diverticulosis & hemorrhoids; Dr Olevia Perches  . LAPAROSCOPIC SIGMOID COLECTOMY  02/26/2012   Procedure: LAPAROSCOPIC SIGMOID COLECTOMY;  Surgeon: Adin Hector, MD;  Location: [facility];  Service: General;  Laterality: N/A;  Laparoscopic Assisted Sigmoid Colectomy  . PARTIAL COLECTOMY  02/26/2012   Procedure: PARTIAL COLECTOMY;  Surgeon: Adin Hector, MD;  Location: [facility];  Service: General;  Laterality: N/A;  Sigmoid Colectomy  . PARTIAL HYSTERECTOMY    . TONSILLECTOMY  as child  . [place]   Social History   Social History  . Marital status: Married    Spouse name: N/A  . Number of children: N/A  . Years of education: N/A   Occupational History  . Retired Pharmacist, hospital    Social History Main Topics  . Smoking status: Never Smoker  . Smokeless tobacco: Never Used     Comment: second hand smoke for decades  . Alcohol use No  . Drug use: No  . Sexual activity: Not on file   Other Topics Concern  . Not on file   Social History  Narrative   High fiber diet         family history includes Alcohol abuse in her mother; Bipolar disorder in her sister; Heart attack (age of onset: 56) in her sister; Heart attack (age of onset: 63) in her brother; Heart attack (age of onset: 2) in her father; Lung cancer in her father; Lymphoma in her sister; Mental illness in her son; Other in her brother; Stroke in her sister; Ulcers in her father.   Review of Systems As above  Objective:  Physical Exam @BP  130/60   Pulse 78   Ht 5\' 1"  (1.549 m)   Wt 136 lb (61.7 kg)   BMI 25.70 kg/m @  General:  NAD Eyes:   anicteric Lungs:  clear Heart::  S1S2 no rubs, murmurs or gallops Abdomen:  soft and nontender, BS+ Ext:   no edema, cyanosis or clubbing    Data Reviewed:  As per HPI Lab Results  Component Value Date   CREATININE 0.88 06/07/2016   BUN 15 06/07/2016   NA 140 06/07/2016   K 4.7 06/07/2016   CL 105 06/07/2016   CO2 29 06/07/2016

## 2017-02-19 ENCOUNTER — Encounter: Payer: Self-pay | Admitting: Internal Medicine

## 2017-02-20 ENCOUNTER — Telehealth: Payer: Self-pay | Admitting: Internal Medicine

## 2017-02-20 NOTE — Telephone Encounter (Signed)
Yes, I can accept her

## 2017-02-20 NOTE — Telephone Encounter (Signed)
Pt called stating that her niece is moving to [place] due a divorce and would like to see you as her pcp. Her name is [name] and is around 76 years old. Would you be willing to see her?

## 2017-02-21 NOTE — Telephone Encounter (Signed)
Appt set up for 12/17

## 2017-04-07 ENCOUNTER — Other Ambulatory Visit (HOSPITAL_COMMUNITY): Payer: Self-pay | Admitting: Cardiology

## 2017-04-10 ENCOUNTER — Ambulatory Visit (HOSPITAL_COMMUNITY): Payer: Commercial Managed Care - PPO | Admitting: Cardiology

## 2017-04-15 ENCOUNTER — Other Ambulatory Visit: Payer: Self-pay | Admitting: Internal Medicine

## 2017-04-22 ENCOUNTER — Encounter: Payer: Self-pay | Admitting: Internal Medicine

## 2017-04-30 ENCOUNTER — Encounter: Payer: Self-pay | Admitting: Internal Medicine

## 2017-04-30 ENCOUNTER — Other Ambulatory Visit: Payer: Self-pay

## 2017-04-30 ENCOUNTER — Ambulatory Visit (AMBULATORY_SURGERY_CENTER): Payer: Commercial Managed Care - PPO | Admitting: Internal Medicine

## 2017-04-30 VITALS — BP 112/61 | HR 56 | Temp 97.5°F | Resp 12 | Ht 61.0 in | Wt 136.0 lb

## 2017-04-30 DIAGNOSIS — Z86010 Personal history of colonic polyps: Secondary | ICD-10-CM | POA: Diagnosis present

## 2017-04-30 MED ORDER — SODIUM CHLORIDE 0.9 % IV SOLN: 500.0000 mL | Freq: Once | INTRAVENOUS | Status: DC

## 2017-04-30 NOTE — Progress Notes (Signed)
A/ox3 pleased with MAC, report to Sheila RN 

## 2017-04-30 NOTE — Patient Instructions (Addendum)
   No polups today - so at 76 and no polyps no need to plan for another routine colonoscopy.  I appreciate the opportunity to care for you.  Gatha Mayer, MD, FACG    YOU HAD AN ENDOSCOPIC PROCEDURE TODAY AT [facility]:   Refer to the procedure report that was given to you for any specific questions about what was found during the examination.  If the procedure report does not answer your questions, please call your gastroenterologist to clarify.  If you requested that your care partner not be given the details of your procedure findings, then the procedure report has been included in a sealed envelope for you to review at your convenience later.  YOU SHOULD EXPECT: Some feelings of bloating in the abdomen. Passage of more gas than usual.  Walking can help get rid of the air that was put into your GI tract during the procedure and reduce the bloating. If you had a lower endoscopy (such as a colonoscopy or flexible sigmoidoscopy) you may notice spotting of blood in your stool or on the toilet paper. If you underwent a bowel prep for your procedure, you may not have a normal bowel movement for a few days.  Please Note:  You might notice some irritation and congestion in your nose or some drainage.  This is from the oxygen used during your procedure.  There is no need for concern and it should clear up in a day or so.  SYMPTOMS TO REPORT IMMEDIATELY:   Following lower endoscopy (colonoscopy or flexible sigmoidoscopy):  Excessive amounts of blood in the stool  Significant tenderness or worsening of abdominal pains  Swelling of the abdomen that is new, acute  Fever of 100F or higher   For urgent or emergent issues, a gastroenterologist can be reached at any hour by calling [phone].   DIET:  We do recommend a small meal at first, but then you may proceed to your regular diet.  Drink plenty of fluids but you should avoid alcoholic beverages for 24  hours.  ACTIVITY:  You should plan to take it easy for the rest of today and you should NOT DRIVE or use heavy machinery until tomorrow (because of the sedation medicines used during the test).    FOLLOW UP: Our staff will call the number listed on your records the next business day following your procedure to check on you and address any questions or concerns that you may have regarding the information given to you following your procedure. If we do not reach you, we will leave a message.  However, if you are feeling well and you are not experiencing any problems, there is no need to return our call.  We will assume that you have returned to your regular daily activities without incident.  If any biopsies were taken you will be contacted by phone or by letter within the next 1-3 weeks.  Please call us at [phone] if you have not heard about the biopsies in 3 weeks.    SIGNATURES/CONFIDENTIALITY: You and/or your care partner have signed paperwork which will be entered into your electronic medical record.  These signatures attest to the fact that that the information above on your After Visit Summary has been reviewed and is understood.  Full responsibility of the confidentiality of this discharge information lies with you and/or your care-partner.   Resume medications. Information given on diverticulosis.

## 2017-04-30 NOTE — Progress Notes (Signed)
Pt's states no medical or surgical changes since previsit or office visit. 

## 2017-04-30 NOTE — Op Note (Signed)
[place] Patient Name: [name] Procedure Date: 04/30/2017 1:18 PM MRN: [id number] Endoscopist: Gatha Mayer , MD Age: 77 Referring MD:  Date of Birth: [birth year] Gender: Female Account #: [id number] Procedure:                Colonoscopy Indications:              High risk colon cancer surveillance: Personal                            history of colonic polyps Medicines:                Propofol per Anesthesia, Monitored Anesthesia Care Procedure:                Pre-Anesthesia Assessment:                           - Prior to the procedure, a History and Physical                            was performed, and patient medications and                            allergies were reviewed. The patient's tolerance of                            previous anesthesia was also reviewed. The risks                            and benefits of the procedure and the sedation                            options and risks were discussed with the patient.                            All questions were answered, and informed consent                            was obtained. Prior Anticoagulants: The patient has                            taken no previous anticoagulant or antiplatelet                            agents. ASA Grade Assessment: III - A patient with                            severe systemic disease. After reviewing the risks                            and benefits, the patient was deemed in                            satisfactory condition to undergo the procedure.  After obtaining informed consent, the colonoscope                            was passed under direct vision. Throughout the                            procedure, the patient's blood pressure, pulse, and                            oxygen saturations were monitored continuously. The                            Colonoscope was introduced through the anus and                            advanced to the  the cecum, identified by                            appendiceal orifice and ileocecal valve. The                            colonoscopy was performed without difficulty. The                            patient tolerated the procedure well. The quality                            of the bowel preparation was good. The ileocecal                            valve, appendiceal orifice, and rectum were                            photographed. The bowel preparation used was                            Miralax. Scope In: 1:32:12 PM Scope Out: 1:46:53 PM Scope Withdrawal Time: 0 hours 10 minutes 17 seconds  Total Procedure Duration: 0 hours 14 minutes 41 seconds  Findings:                 The perianal and digital rectal examinations were                            normal.                           Multiple diverticula were found in the entire colon.                           There was evidence of a prior end-to-side                            colo-colonic anastomosis in the recto-sigmoid  colon. This was patent and was characterized by                            healthy appearing mucosa. The anastomosis was                            traversed.                           The exam was otherwise without abnormality on                            direct and retroflexion views. Complications:            No immediate complications. Estimated Blood Loss:     Estimated blood loss: none. Impression:               - Diverticulosis in the entire examined colon.                           - Patent end-to-side colo-colonic anastomosis,                            characterized by healthy appearing mucosa.                           - The examination was otherwise normal on direct                            and retroflexion views.                           - No specimens collected.                           - Personal history of colonic polyps. Last had an                             adenoma 2011 Recommendation:           - Patient has a contact number available for                            emergencies. The signs and symptoms of potential                            delayed complications were discussed with the                            patient. Return to normal activities tomorrow.                            Written discharge instructions were provided to the                            patient.                           -  Resume previous diet.                           - Continue present medications.                           - No repeat colonoscopy due to age and the absence                            of colonic polyps. Gatha Mayer, MD 04/30/2017 1:52:50 PM This report has been signed electronically.

## 2017-05-01 ENCOUNTER — Telehealth: Payer: Self-pay | Admitting: Internal Medicine

## 2017-05-01 ENCOUNTER — Telehealth: Payer: Self-pay

## 2017-05-01 NOTE — Telephone Encounter (Signed)
I spoke with Natale Milch and she said the Nexium 40mg  works "really well" for her once daily.  Please advice mg to send in Sir, thank you.

## 2017-05-01 NOTE — Telephone Encounter (Signed)
Esomeprazole 20 mg daily before breakfast #90 3 refill  Double check she is taking qd  I think she is

## 2017-05-01 NOTE — Telephone Encounter (Signed)
40 mg dose is fine

## 2017-05-01 NOTE — Telephone Encounter (Signed)
  Follow up Call-  Call back number 04/30/2017  Post procedure Call Back phone  # [phone]  Permission to leave phone message Yes  Some recent data might be hidden     Patient questions:  Do you have a fever, pain , or abdominal swelling? No. Pain Score  0 *  Have you tolerated food without any problems? Yes.    Have you been able to return to your normal activities? Yes.    Do you have any questions about your discharge instructions: Diet   No. Medications  No. Follow up visit  No.  Do you have questions or concerns about your Care? No.  Actions: * If pain score is 4 or above: No action needed, pain <4.

## 2017-05-01 NOTE — Telephone Encounter (Signed)
Patient wanting to know if Dr.Gessner can prescribe her nexium. Pt states she thinks Dr.Brodie used to prescribe it for her, but now she is taking it over the counter. Pt states it seems to work for her. Pt had colon yesterday 1.23.19.

## 2017-05-01 NOTE — Telephone Encounter (Signed)
Please advise Sir, thank you. 

## 2017-05-02 ENCOUNTER — Ambulatory Visit (HOSPITAL_COMMUNITY)
Admission: RE | Admit: 2017-05-02 | Discharge: 2017-05-02 | Disposition: A | Discharge status: Home / Self Care | Payer: Commercial Managed Care - PPO | Source: Ambulatory Visit | Attending: Cardiology | Admitting: Cardiology

## 2017-05-02 ENCOUNTER — Encounter (HOSPITAL_COMMUNITY): Payer: Self-pay | Admitting: Cardiology

## 2017-05-02 VITALS — BP 119/62 | HR 65 | Wt 132.4 lb

## 2017-05-02 DIAGNOSIS — Z882 Allergy status to sulfonamides status: Secondary | ICD-10-CM | POA: Insufficient documentation

## 2017-05-02 DIAGNOSIS — I252 Old myocardial infarction: Secondary | ICD-10-CM | POA: Insufficient documentation

## 2017-05-02 DIAGNOSIS — Z881 Allergy status to other antibiotic agents status: Secondary | ICD-10-CM | POA: Diagnosis not present

## 2017-05-02 DIAGNOSIS — Z9861 Coronary angioplasty status: Secondary | ICD-10-CM | POA: Diagnosis not present

## 2017-05-02 DIAGNOSIS — I2510 Atherosclerotic heart disease of native coronary artery without angina pectoris: Secondary | ICD-10-CM | POA: Diagnosis not present

## 2017-05-02 DIAGNOSIS — Z79899 Other long term (current) drug therapy: Secondary | ICD-10-CM | POA: Diagnosis not present

## 2017-05-02 DIAGNOSIS — Z7982 Long term (current) use of aspirin: Secondary | ICD-10-CM | POA: Diagnosis not present

## 2017-05-02 DIAGNOSIS — K219 Gastro-esophageal reflux disease without esophagitis: Secondary | ICD-10-CM | POA: Insufficient documentation

## 2017-05-02 DIAGNOSIS — Z885 Allergy status to narcotic agent status: Secondary | ICD-10-CM | POA: Diagnosis not present

## 2017-05-02 DIAGNOSIS — Z880 Allergy status to penicillin: Secondary | ICD-10-CM | POA: Insufficient documentation

## 2017-05-02 DIAGNOSIS — M797 Fibromyalgia: Secondary | ICD-10-CM | POA: Insufficient documentation

## 2017-05-02 DIAGNOSIS — E785 Hyperlipidemia, unspecified: Secondary | ICD-10-CM | POA: Diagnosis not present

## 2017-05-02 DIAGNOSIS — G250 Essential tremor: Secondary | ICD-10-CM | POA: Diagnosis not present

## 2017-05-02 DIAGNOSIS — K589 Irritable bowel syndrome without diarrhea: Secondary | ICD-10-CM | POA: Insufficient documentation

## 2017-05-02 LAB — CBC
HCT: 37.3 % (ref 36.0–46.0)
HEMOGLOBIN: 12.6 g/dL (ref 12.0–15.0)
MCH: 33.7 pg (ref 26.0–34.0)
MCHC: 33.8 g/dL (ref 30.0–36.0)
MCV: 99.7 fL (ref 78.0–100.0)
PLATELETS: 154 10*3/uL (ref 150–400)
RBC: 3.74 MIL/uL — AB (ref 3.87–5.11)
RDW: 12.4 % (ref 11.5–15.5)
WBC: 10.2 10*3/uL (ref 4.0–10.5)

## 2017-05-02 LAB — BASIC METABOLIC PANEL
ANION GAP: 11 (ref 5–15)
BUN: 15 mg/dL (ref 6–20)
CHLORIDE: 104 mmol/L (ref 101–111)
CO2: 23 mmol/L (ref 22–32)
CREATININE: 0.85 mg/dL (ref 0.44–1.00)
Calcium: 9.5 mg/dL (ref 8.9–10.3)
GFR calc non Af Amer: >60 mL/min (ref >60)
Glucose, Bld: 98 mg/dL (ref 65–99)
POTASSIUM: 4.3 mmol/L (ref 3.5–5.1)
SODIUM: 138 mmol/L (ref 135–145)

## 2017-05-02 LAB — LIPID PANEL
CHOL/HDL RATIO: 3.6 ratio
Cholesterol: 134 mg/dL (ref 0–200)
HDL: 37 mg/dL — AB (ref >40)
LDL Cholesterol: 55 mg/dL (ref 0–99)
Triglycerides: 210 mg/dL — ABNORMAL HIGH (ref <150)
VLDL: 42 mg/dL — AB (ref 0–40)

## 2017-05-02 MED ORDER — ESOMEPRAZOLE MAGNESIUM 40 MG PO CPDR: 40.0000 mg | DELAYED_RELEASE_CAPSULE | Freq: Every day | ORAL | 3 refills | Status: DC

## 2017-05-02 NOTE — Patient Instructions (Signed)
Labs drawn today (if we do not call you, then your lab work was stable)   Your physician recommends that you schedule a follow-up appointment in: 1 year with Dr. McLean    

## 2017-05-02 NOTE — Telephone Encounter (Signed)
I called and left [name] a detailed voicemail with what we are sending in. Confirmed pharmacy yesterday.

## 2017-05-03 NOTE — Progress Notes (Signed)
Patient ID: [name], female   DOB: 20-Apr-1940, 77 y.o.   MRN: [id number] PCP: Dr. Quay Burow Cardiology: Dr. Aundra Dubin  77 y.o. with history of CAD s/p inferior MI in 1999 and left heart cath in 4/07 with 70% proximal CFX stenosis but normal myoview presents for cardiology followup.  In 4/17, she had ventral hernia repair.  This was complicated by severe hyponatremia.    No chest pain.  She exercises regularly, no exertional dyspnea. Weight has trended down.  Feels good overall. Taking clonidine for tremor, works well.  Tolerating ASA without GI intolerance.   ECG: NSR, PVC (personally reviewed)  Labs (3/12): TSH normal, free T3 low, free T4 normal, LDL 90 Labs (2/13): TGs 255, HDL 43, LDL 84 Labs (11/13): K 3.4, creatinine 0.55 Labs (1/16): K 3.5, creatinine 0.7, LDL 71, HDL 35, TSH normal, HCT 39.9 Labs (2/17): LDL 78, HDL 49 Labs (10/17): K 4.5, creatinine 0.85 Labs (3/18): LDL 78, HDL 47  PMH: 1. CAD: Inferior MI in 1999 treated with PTCA.  Left heart cath in 4/07 showed 70% proximal CFX stenosis with EF 60%.   - Myoview was done post-cath and showed no ischemia or infarction.   - Echo (11/11): EF 60-65%, moderate pulmonic insufficiency. Echo (11/12) with EF 55-60%, normal RV, mild PI.  Echo (2/15) with EF 55-60%, mild LVH, mild MR, mild AI.  3. Hyperlipidemia 4. GERD 5. Fibromyalgia 6. C-spine stenosis 7. Migraines 8. IBS 9. Recurrent diverticulitis: sigmoid diverticulitis 11/13.  10. Essential tremor 11. Bradycardia 12. Ventral hernia repair 4/17: She developed severe hyponatremia post-operatively.    Allergies  Allergen Reactions  . Amphetamine-Dextroamphet Er Other (See Comments)    unknowb reaction  . Hydromorphone Shortness Of Breath    BREATHING AND CARDIAC ISSUES  . Metronidazole Other (See Comments)    Unknown REACTION: SORES IN MOUTH, severe diarrhea  . Penicillins     RASH  . Thorazine [Chlorpromazine] Other (See Comments)    " had a total hepatic wipeout" in  1974  . Bupivacaine Other (See Comments)    Unknown  . Atorvastatin Other (See Comments)    Upset stomach  . Azithromycin     Unknown reaction  . Cephalexin     Unknown reaction  . Chlorpromazine Hcl     Unknown reaction  . Ciprofloxacin     REACTION: SEVERE PAIN,DEHYDRATION,DIARRHEA  . Doxycycline Diarrhea  . Ezetimibe     REACTION: JOINT/MUSCLE ACHES  . Hyoscyamine Sulfate     Unknown reaction  . Lactose Intolerance (Gi) Diarrhea  . Lidocaine Swelling  . Oatmeal Other (See Comments)     Sinus Congestion   . Sulfamethoxazole     REACTION: GI upset-acid reflux  . Telithromycin     Unknown reaction  . Topiramate     Unknown reaction  . Latex Rash   SH: Married, husband has lung cancer.  3 children.  Never smoked.  Lives in [place].   FH: Father with MI.   ROS: All systems reviewed and negative except as per HPI.   Current Outpatient Medications  Medication Sig Dispense Refill  . acetaminophen (TYLENOL) 650 MG CR tablet Take 1,300 mg by mouth 2 (two) times daily.    Marland Kitchen alendronate (FOSAMAX) 70 MG tablet TAKE 1 TAB ONCE A WEEK, AT LEAST 30 MIN BEFORE 1ST FOOD.DO NOT LIE DOWN FOR 30 MIN AFTER TAKING. 4 tablet 0  . aspirin EC 81 MG tablet Take 1 tablet (81 mg total) by mouth every other day.    Marland Kitchen  BIOTIN PO Take by mouth.    . Calcium Carbonate (CALTRATE 600 PO) Take 1 tablet by mouth daily.    . Cholecalciferol (VITAMIN D3 PO) Take 1 tablet by mouth daily.    . clobetasol cream (TEMOVATE) 2.77 % Apply 1 application topically once a week.     . cloNIDine (CATAPRES) 0.1 MG tablet Take 0.1 mg by mouth daily.     . Coenzyme Q10 (COQ10) 100 MG CAPS Take 1 capsule by mouth daily.     Marland Kitchen donepezil (ARICEPT) 10 MG tablet Take 10 mg by mouth every morning.    Marland Kitchen esomeprazole (NEXIUM) 40 MG capsule Take 1 capsule (40 mg total) by mouth daily before breakfast. 90 capsule 3  . Ginkgo Biloba 40 MG TABS Take by mouth.    . IRON PO Take by mouth.    . Lactase (LACTAID PO) Take 1  tablet by mouth as needed (Lactose Intolerance).    . loratadine (CLARITIN) 10 MG tablet Take 10 mg by mouth daily.    . Melatonin 5 MG TABS Take 1 tablet by mouth at bedtime.     . methylPREDNISolone acetate PF (DEPO-MEDROL) 40 MG/ML SUSP injection Every 6 months with Dr. Nelva Bush 1 mL   . Multiple Vitamins-Minerals (CENTRUM SILVER PO) Take by mouth.    . naproxen sodium (RA NAPROXEN SODIUM) 220 MG tablet Take 220 mg by mouth 2 (two) times daily with a meal.     . NEXIUM 40 MG capsule TAKE (1) CAPSULE DAILY. 30 capsule 0  . Potassium (POTASSIMIN PO) Take by mouth daily.    . Psyllium (METAMUCIL PO) Take by mouth. 4 by mouth daily    . QUEtiapine Fumarate (SEROQUEL XR) 150 MG 24 hr tablet Take 150 mg by mouth at bedtime.    . rosuvastatin (CRESTOR) 20 MG tablet TAKE ONE TABLET AT BEDTIME. 30 tablet 11  . Simethicone (GAS-X PO) Take by mouth.    . Vitamin E (E200 PO) Take by mouth.     No current facility-administered medications for this encounter.     BP 119/62 (BP Location: Left Arm, Patient Position: Sitting, Cuff Size: Normal)   Pulse 65   Wt 132 lb 6.4 oz (60.1 kg)   SpO2 98%   BMI 25.02 kg/m  General: NAD Neck: No JVD, no thyromegaly or thyroid nodule.  Lungs: Clear to auscultation bilaterally with normal respiratory effort. CV: Nondisplaced PMI.  Heart regular S1/S2, no S3/S4, no murmur.  No peripheral edema.  No carotid bruit.  Normal pedal pulses.  Abdomen: Soft, nontender, no hepatosplenomegaly, no distention.  Skin: Intact without lesions or rashes.  Neurologic: Alert and oriented x 3.  Psych: Normal affect. Extremities: No clubbing or cyanosis.  HEENT: Normal.   Assessment/Plan: 1. CAD: Stable with no ischemic symptoms.   - Continue statin. - Continue ASA 81 daily.   2. Hyperlipidemia: Check lipids today.  3. Tremor: Taking clonidine.     [facility]   [name] 05/03/2017

## 2017-05-07 ENCOUNTER — Encounter (HOSPITAL_COMMUNITY): Payer: Self-pay

## 2017-05-11 ENCOUNTER — Other Ambulatory Visit: Payer: Self-pay | Admitting: Internal Medicine

## 2017-06-06 ENCOUNTER — Encounter: Payer: Self-pay | Admitting: Internal Medicine

## 2017-06-10 ENCOUNTER — Telehealth: Payer: Self-pay | Admitting: Internal Medicine

## 2017-06-10 DIAGNOSIS — I10 Essential (primary) hypertension: Secondary | ICD-10-CM

## 2017-06-10 DIAGNOSIS — R7303 Prediabetes: Secondary | ICD-10-CM

## 2017-06-10 DIAGNOSIS — Z9861 Coronary angioplasty status: Secondary | ICD-10-CM

## 2017-06-10 DIAGNOSIS — E785 Hyperlipidemia, unspecified: Secondary | ICD-10-CM

## 2017-06-10 DIAGNOSIS — I2510 Atherosclerotic heart disease of native coronary artery without angina pectoris: Secondary | ICD-10-CM

## 2017-06-10 NOTE — Telephone Encounter (Signed)
Would you be able to do that or would she need to wait until her visit?

## 2017-06-10 NOTE — Telephone Encounter (Signed)
Copied from [place]. Topic: General - Other >> Jun 10, 2017  8:39 AM Burnis Medin, NT wrote: Reason for CRM: Patient called and said she has a cpe scheduled for 3/15 and wanted to see if the doctor could put orders in so she can come in before her appointment to get labs. Pt would like a call back if this is ok.

## 2017-06-11 ENCOUNTER — Encounter: Payer: Self-pay | Admitting: Internal Medicine

## 2017-06-12 NOTE — Telephone Encounter (Signed)
It looks like Pt had labs done in Hospital in January. Please advise, labs pending.

## 2017-06-12 NOTE — Telephone Encounter (Signed)
Okay order signed.

## 2017-06-16 ENCOUNTER — Other Ambulatory Visit (INDEPENDENT_AMBULATORY_CARE_PROVIDER_SITE_OTHER): Payer: Commercial Managed Care - PPO

## 2017-06-16 DIAGNOSIS — E785 Hyperlipidemia, unspecified: Secondary | ICD-10-CM

## 2017-06-16 DIAGNOSIS — I10 Essential (primary) hypertension: Secondary | ICD-10-CM | POA: Diagnosis not present

## 2017-06-16 LAB — CBC WITH DIFFERENTIAL/PLATELET
BASOS PCT: 0.9 % (ref 0.0–3.0)
Basophils Absolute: 0 10*3/uL (ref 0.0–0.1)
EOS ABS: 0.2 10*3/uL (ref 0.0–0.7)
Eosinophils Relative: 3.9 % (ref 0.0–5.0)
HEMATOCRIT: 39.3 % (ref 36.0–46.0)
Hemoglobin: 13.6 g/dL (ref 12.0–15.0)
LYMPHS ABS: 1.5 10*3/uL (ref 0.7–4.0)
Lymphocytes Relative: 29.4 % (ref 12.0–46.0)
MCHC: 34.5 g/dL (ref 30.0–36.0)
MCV: 98.9 fl (ref 78.0–100.0)
MONO ABS: 0.7 10*3/uL (ref 0.1–1.0)
Monocytes Relative: 13.2 % — ABNORMAL HIGH (ref 3.0–12.0)
NEUTROS ABS: 2.7 10*3/uL (ref 1.4–7.7)
NEUTROS PCT: 52.6 % (ref 43.0–77.0)
PLATELETS: 194 10*3/uL (ref 150.0–400.0)
RBC: 3.97 Mil/uL (ref 3.87–5.11)
RDW: 12.6 % (ref 11.5–15.5)
WBC: 5.1 10*3/uL (ref 4.0–10.5)

## 2017-06-16 LAB — LIPID PANEL
CHOL/HDL RATIO: 3
CHOLESTEROL: 129 mg/dL (ref 0–200)
HDL: 42.8 mg/dL (ref >39.00)
LDL CALC: 60 mg/dL (ref 0–99)
NonHDL: 86.21
TRIGLYCERIDES: 130 mg/dL (ref 0.0–149.0)
VLDL: 26 mg/dL (ref 0.0–40.0)

## 2017-06-16 LAB — TSH: TSH: 1.57 u[IU]/mL (ref 0.35–4.50)

## 2017-06-16 LAB — COMPREHENSIVE METABOLIC PANEL
ALT: 11 U/L (ref 0–35)
AST: 43 U/L — ABNORMAL HIGH (ref 0–37)
Albumin: 4.3 g/dL (ref 3.5–5.2)
Alkaline Phosphatase: 50 U/L (ref 39–117)
BUN: 7 mg/dL (ref 6–23)
CALCIUM: 9.9 mg/dL (ref 8.4–10.5)
CHLORIDE: 102 meq/L (ref 96–112)
CO2: 28 meq/L (ref 19–32)
Creatinine, Ser: 0.82 mg/dL (ref 0.40–1.20)
GFR: 71.94 mL/min (ref >60.00)
GLUCOSE: 122 mg/dL — AB (ref 70–99)
Potassium: 4.7 mEq/L (ref 3.5–5.1)
Sodium: 139 mEq/L (ref 135–145)
Total Bilirubin: 0.4 mg/dL (ref 0.2–1.2)
Total Protein: 6.9 g/dL (ref 6.0–8.3)

## 2017-06-19 NOTE — Patient Instructions (Addendum)
All other Health Maintenance issues reviewed.   All recommended immunizations and age-appropriate screenings are up-to-date or discussed.  No immunizations administered today.   Medications reviewed and updated.  No changes recommended at this time.  Please followup in one year   Health Maintenance, Female Adopting a healthy lifestyle and getting preventive care can go a long way to promote health and wellness. Talk with your health care provider about what schedule of regular examinations is right for you. This is a good chance for you to check in with your provider about disease prevention and staying healthy. In between checkups, there are plenty of things you can do on your own. Experts have done a lot of research about which lifestyle changes and preventive measures are most likely to keep you healthy. Ask your health care provider for more information. Weight and diet Eat a healthy diet  Be sure to include plenty of vegetables, fruits, low-fat dairy products, and lean protein.  Do not eat a lot of foods high in solid fats, added sugars, or salt.  Get regular exercise. This is one of the most important things you can do for your health. ? Most adults should exercise for at least 150 minutes each week. The exercise should increase your heart rate and make you sweat (moderate-intensity exercise). ? Most adults should also do strengthening exercises at least twice a week. This is in addition to the moderate-intensity exercise.  Maintain a healthy weight  Body mass index (BMI) is a measurement that can be used to identify possible weight problems. It estimates body fat based on height and weight. Your health care provider can help determine your BMI and help you achieve or maintain a healthy weight.  For females 20 years of age and older: ? A BMI below 18.5 is considered underweight. ? A BMI of 18.5 to 24.9 is normal. ? A BMI of 25 to 29.9 is considered overweight. ? A BMI of 30 and  above is considered obese.  Watch levels of cholesterol and blood lipids  You should start having your blood tested for lipids and cholesterol at 77 years of age, then have this test every 5 years.  You may need to have your cholesterol levels checked more often if: ? Your lipid or cholesterol levels are high. ? You are older than 77 years of age. ? You are at high risk for heart disease.  Cancer screening Lung Cancer  Lung cancer screening is recommended for adults 55-80 years old who are at high risk for lung cancer because of a history of smoking.  A yearly low-dose CT scan of the lungs is recommended for people who: ? Currently smoke. ? Have quit within the past 15 years. ? Have at least a 30-pack-year history of smoking. A pack year is smoking an average of one pack of cigarettes a day for 1 year.  Yearly screening should continue until it has been 15 years since you quit.  Yearly screening should stop if you develop a health problem that would prevent you from having lung cancer treatment.  Breast Cancer  Practice breast self-awareness. This means understanding how your breasts normally appear and feel.  It also means doing regular breast self-exams. Let your health care provider know about any changes, no matter how small.  If you are in your 20s or 30s, you should have a clinical breast exam (CBE) by a health care provider every 1-3 years as part of a regular health exam.  If you   you are 40 or older, have a CBE every year. Also consider having a breast X-ray (mammogram) every year.  If you have a family history of breast cancer, talk to your health care provider about genetic screening.  If you are at high risk for breast cancer, talk to your health care provider about having an MRI and a mammogram every year.  Breast cancer gene (BRCA) assessment is recommended for women who have family members with BRCA-related cancers. BRCA-related cancers  include: ? Breast. ? Ovarian. ? Tubal. ? Peritoneal cancers.  Results of the assessment will determine the need for genetic counseling and BRCA1 and BRCA2 testing.  Cervical Cancer Your health care provider may recommend that you be screened regularly for cancer of the pelvic organs (ovaries, uterus, and vagina). This screening involves a pelvic examination, including checking for microscopic changes to the surface of your cervix (Pap test). You may be encouraged to have this screening done every 3 years, beginning at age 34.  For women ages 5-65, health care providers may recommend pelvic exams and Pap testing every 3 years, or they may recommend the Pap and pelvic exam, combined with testing for human papilloma virus (HPV), every 5 years. Some types of HPV increase your risk of cervical cancer. Testing for HPV may also be done on women of any age with unclear Pap test results.  Other health care providers may not recommend any screening for nonpregnant women who are considered low risk for pelvic cancer and who do not have symptoms. Ask your health care provider if a screening pelvic exam is right for you.  If you have had past treatment for cervical cancer or a condition that could lead to cancer, you need Pap tests and screening for cancer for at least 20 years after your treatment. If Pap tests have been discontinued, your risk factors (such as having a new sexual partner) need to be reassessed to determine if screening should resume. Some women have medical problems that increase the chance of getting cervical cancer. In these cases, your health care provider may recommend more frequent screening and Pap tests.  Colorectal Cancer  This type of cancer can be detected and often prevented.  Routine colorectal cancer screening usually begins at 77 years of age and continues through 77 years of age.  Your health care provider may recommend screening at an earlier age if you have risk factors  for colon cancer.  Your health care provider may also recommend using home test kits to check for hidden blood in the stool.  A small camera at the end of a tube can be used to examine your colon directly (sigmoidoscopy or colonoscopy). This is done to check for the earliest forms of colorectal cancer.  Routine screening usually begins at age 70.  Direct examination of the colon should be repeated every 5-10 years through 77 years of age. However, you may need to be screened more often if early forms of precancerous polyps or small growths are found.  Skin Cancer  Check your skin from head to toe regularly.  Tell your health care provider about any new moles or changes in moles, especially if there is a change in a mole's shape or color.  Also tell your health care provider if you have a mole that is larger than the size of a pencil eraser.  Always use sunscreen. Apply sunscreen liberally and repeatedly throughout the day.  Protect yourself by wearing long sleeves, pants, a wide-brimmed hat, and sunglasses  whenever you are outside.  Heart disease, diabetes, and high blood pressure  High blood pressure causes heart disease and increases the risk of stroke. High blood pressure is more likely to develop in: ? People who have blood pressure in the high end of the normal range (130-139/85-89 mm Hg). ? People who are overweight or obese. ? People who are African American.  If you are 64-28 years of age, have your blood pressure checked every 3-5 years. If you are 58 years of age or older, have your blood pressure checked every year. You should have your blood pressure measured twice-once when you are at a hospital or clinic, and once when you are not at a hospital or clinic. Record the average of the two measurements. To check your blood pressure when you are not at a hospital or clinic, you can use: ? An automated blood pressure machine at a pharmacy. ? A home blood pressure monitor.  If  you are between 46 years and 42 years old, ask your health care provider if you should take aspirin to prevent strokes.  Have regular diabetes screenings. This involves taking a blood sample to check your fasting blood sugar level. ? If you are at a normal weight and have a low risk for diabetes, have this test once every three years after 77 years of age. ? If you are overweight and have a high risk for diabetes, consider being tested at a younger age or more often. Preventing infection Hepatitis B  If you have a higher risk for hepatitis B, you should be screened for this virus. You are considered at high risk for hepatitis B if: ? You were born in a country where hepatitis B is common. Ask your health care provider which countries are considered high risk. ? Your parents were born in a high-risk country, and you have not been immunized against hepatitis B (hepatitis B vaccine). ? You have HIV or AIDS. ? You use needles to inject street drugs. ? You live with someone who has hepatitis B. ? You have had sex with someone who has hepatitis B. ? You get hemodialysis treatment. ? You take certain medicines for conditions, including cancer, organ transplantation, and autoimmune conditions.  Hepatitis C  Blood testing is recommended for: ? Everyone born from [birth year] through 1965. ? Anyone with known risk factors for hepatitis C.  Sexually transmitted infections (STIs)  You should be screened for sexually transmitted infections (STIs) including gonorrhea and chlamydia if: ? You are sexually active and are younger than 77 years of age. ? You are older than 77 years of age and your health care provider tells you that you are at risk for this type of infection. ? Your sexual activity has changed since you were last screened and you are at an increased risk for chlamydia or gonorrhea. Ask your health care provider if you are at risk.  If you do not have HIV, but are at risk, it may be recommended  that you take a prescription medicine daily to prevent HIV infection. This is called pre-exposure prophylaxis (PrEP). You are considered at risk if: ? You are sexually active and do not regularly use condoms or know the HIV status of your partner(s). ? You take drugs by injection. ? You are sexually active with a partner who has HIV.  Talk with your health care provider about whether you are at high risk of being infected with HIV. If you choose to begin PrEP, you should  first be tested for HIV. You should then be tested every 3 months for as long as you are taking PrEP. Pregnancy  If you are premenopausal and you may become pregnant, ask your health care provider about preconception counseling.  If you may become pregnant, take 400 to 800 micrograms (mcg) of folic acid every day.  If you want to prevent pregnancy, talk to your health care provider about birth control (contraception). Osteoporosis and menopause  Osteoporosis is a disease in which the bones lose minerals and strength with aging. This can result in serious bone fractures. Your risk for osteoporosis can be identified using a bone density scan.  If you are 38 years of age or older, or if you are at risk for osteoporosis and fractures, ask your health care provider if you should be screened.  Ask your health care provider whether you should take a calcium or vitamin D supplement to lower your risk for osteoporosis.  Menopause may have certain physical symptoms and risks.  Hormone replacement therapy may reduce some of these symptoms and risks. Talk to your health care provider about whether hormone replacement therapy is right for you. Follow these instructions at home:  Schedule regular health, dental, and eye exams.  Stay current with your immunizations.  Do not use any tobacco products including cigarettes, chewing tobacco, or electronic cigarettes.  If you are pregnant, do not drink alcohol.  If you are  breastfeeding, limit how much and how often you drink alcohol.  Limit alcohol intake to no more than 1 drink per day for nonpregnant women. One drink equals 12 ounces of beer, 5 ounces of wine, or 1 ounces of hard liquor.  Do not use street drugs.  Do not share needles.  Ask your health care provider for help if you need support or information about quitting drugs.  Tell your health care provider if you often feel depressed.  Tell your health care provider if you have ever been abused or do not feel safe at home. This information is not intended to replace advice given to you by your health care provider. Make sure you discuss any questions you have with your health care provider. Document Released: 10/08/2010 Document Revised: 08/31/2015 Document Reviewed: 12/27/2014 Elsevier Interactive Patient Education  [employer].

## 2017-06-19 NOTE — Progress Notes (Signed)
Subjective:    Patient ID: [name], female    DOB: 1940-05-20, 77 y.o.   MRN: [id number]  HPI She is here for a physical exam.   She denies any changes in her health since she was here last.  She has no major concerns or questions.  She is curious regarding her blood work.  Overall she is trying to lead a more healthy lifestyle-she is eating well and has been exercising more regularly.  Medications and allergies reviewed with patient and updated if appropriate.  Patient Active Problem List   Diagnosis Date Noted  . Prediabetes 06/10/2016  . Anesthesia complication 70/62/3762  . Abnormal CT scan, lung 08/11/15 08/19/2015  . 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 Cough 08/19/2015  . S/P herniorrhaphy 08/01/2015  . Corneal abrasion, left 06/08/2015  . Osteoporosis 05/17/2012  . Hypertension 02/28/2012  . 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 Diverticulitis 10/24/2011  . Pulmonic insufficiency 08/27/2010  . TREMOR, ESSENTIAL 06/20/2010  . CARDIOMEGALY, MILD 02/01/2010  . UNSPECIFIED VITAMIN D DEFICIENCY 01/16/2010  . LIPOMAS, MULTIPLE 09/26/2008  . Anxiety state 07/14/2008  . Dyslipidemia 07/11/2008  . FIBROCYSTIC BREAST DISEASE 06/08/2008  . Irritable bowel syndrome 08/27/2007  . GERD 08/26/2007  . CAD S/P percutaneous coronary angioplasty 07/10/2007  . Migraine, unspecified, without mention of intractable migraine without mention of status migrainosus 03/12/2007  . ACUT MI ANTEROLAT WALL SUBSQT EPIS CARE 03/12/2007  . ALLERGIC RHINITIS 03/12/2007  . ABDOMINAL BRUIT 03/12/2007  . Diverticulosis of large intestine 11/25/2006    Current Outpatient Medications on File Prior to Visit  Medication Sig Dispense Refill  . aspirin EC 81 MG tablet Take 1 tablet (81 mg total) by mouth every other day.    Marland Kitchen BIOTIN PO Take by mouth.    . Calcium Carbonate (CALTRATE 600 PO) Take 1 tablet by mouth daily.    . Cholecalciferol (VITAMIN D3 PO) Take  1 tablet by mouth daily.    . clobetasol cream (TEMOVATE) 8.31 % Apply 1 application topically once a week.     . cloNIDine (CATAPRES) 0.1 MG tablet Take 0.1 mg by mouth daily.     . Coenzyme Q10 (COQ10) 100 MG CAPS Take 1 capsule by mouth daily.     Marland Kitchen donepezil (ARICEPT) 10 MG tablet Take 10 mg by mouth every morning.    Marland Kitchen esomeprazole (NEXIUM) 40 MG capsule Take 1 capsule (40 mg total) by mouth daily before breakfast. 90 capsule 3  . Ginkgo Biloba 40 MG TABS Take by mouth.    . IRON PO Take by mouth.    . loratadine (CLARITIN) 10 MG tablet Take 10 mg by mouth daily.    . Melatonin 5 MG TABS Take 1 tablet by mouth at bedtime.     . methylPREDNISolone acetate PF (DEPO-MEDROL) 40 MG/ML SUSP injection Every 6 months with Dr. Nelva Bush (Patient taking differently: Yearly with Dr. Nelva Bush) 1 mL   . Multiple Vitamins-Minerals (CENTRUM SILVER PO) Take by mouth.    . naproxen sodium (RA NAPROXEN SODIUM) 220 MG tablet Take 220 mg by mouth 2 (two) times daily with a meal.     . NEXIUM 40 MG capsule TAKE (1) CAPSULE DAILY. 30 capsule 0  . Potassium (POTASSIMIN PO) Take by mouth daily.    . Psyllium (METAMUCIL PO) Take by mouth. 4 by mouth daily    . QUEtiapine Fumarate (SEROQUEL XR) 150 MG 24 hr tablet Take 150 mg by mouth at bedtime.    Marland Kitchen  rosuvastatin (CRESTOR) 20 MG tablet TAKE ONE TABLET AT BEDTIME. 30 tablet 11  . Simethicone (GAS-X PO) Take by mouth.    . Vitamin E (E200 PO) Take by mouth.     No current facility-administered medications on file prior to visit.     Past Medical History:  Diagnosis Date  . Abdominal bruit   . Abnormal weight gain   . Allergic rhinitis, cause unspecified   . CAD (coronary artery disease)    diaphragmatic wall infarction in 1999, treated with balloon angioplasty with nonobstructive disease at ast catheterization in 2007 as described above  . Colon polyp 12/20/2011   Tubular adenoma  . Depression   . Diverticular disease    acute diverticulitis 10/13/11  .  Fibromyalgia   . GERD (gastroesophageal reflux disease)   . Hyperlipemia    with low HDL.  . IBS (irritable bowel syndrome)   . Migraine, unspecified, without mention of intractable migraine without mention of status migrainosus   . Tremor   . Unspecified adverse effect of unspecified drug, medicinal and biological substance   . Ventral incisional hernia 06/04/2015    Past Surgical History:  Procedure Laterality Date  . ABDOMINAL HYSTERECTOMY  1979   partial  . ABDOMINAL SURGERY      @ [facility]  in [place] in 2002  for ptosis of organs; mersilene mesh sling, cystocopy  by Dr  Rosana Hoes, Urologist  . BLADDER SURGERY  patient unsure of date   sling  . CATARACT EXTRACTION     left eye  . COLONOSCOPY  2011   Diverticulosis & hemorrhoids; Dr Olevia Perches  . LAPAROSCOPIC SIGMOID COLECTOMY  02/26/2012   Procedure: LAPAROSCOPIC SIGMOID COLECTOMY;  Surgeon: Adin Hector, MD;  Location: [facility];  Service: General;  Laterality: N/A;  Laparoscopic Assisted Sigmoid Colectomy  . PARTIAL COLECTOMY  02/26/2012   Procedure: PARTIAL COLECTOMY;  Surgeon: Adin Hector, MD;  Location: [facility];  Service: General;  Laterality: N/A;  Sigmoid Colectomy  . PARTIAL HYSTERECTOMY    . TONSILLECTOMY  as child  . [place]    Social History   Socioeconomic History  . Marital status: Married    Spouse name: None  . Number of children: None  . Years of education: None  . Highest education level: None  Social Needs  . Financial resource strain: None  . Food insecurity - worry: None  . Food insecurity - inability: None  . Transportation needs - medical: None  . Transportation needs - non-medical: None  Occupational History  . Occupation: Retired Pharmacist, hospital  Tobacco Use  . Smoking status: Never Smoker  . Smokeless tobacco: Never Used  . Tobacco comment: second hand smoke for decades  Substance and Sexual Activity  . Alcohol use: No  . Drug use: No  . Sexual activity: None  Other Topics Concern    . None  Social History Narrative   High fiber diet          Family History  Problem Relation Age of Onset  . Heart attack Father 70  . Ulcers Father   . Lung cancer Father   . Alcohol abuse Mother   . Heart attack Brother 55       1/2 BROTHER  . Bipolar disorder Sister   . Heart attack Sister 72  . Lymphoma Sister   . Stroke Sister        1/2 SISTER  . Other Brother        brain  tumor  . Mental illness Son   . Colon cancer Neg Hx     Review of Systems  Constitutional: Negative for appetite change, chills, fatigue, fever and unexpected weight change.  Eyes: Negative for visual disturbance.  Respiratory: Negative for cough, shortness of breath and wheezing.   Cardiovascular: Negative for chest pain, palpitations and leg swelling.  Gastrointestinal: Negative for abdominal pain, blood in stool, constipation, diarrhea and nausea.       GERD rare with nexium  Genitourinary: Negative for dysuria and hematuria.  Musculoskeletal: Positive for arthralgias (shoulder). Negative for back pain and myalgias.  Skin: Negative for color change and rash.  Neurological: Negative for dizziness, light-headedness, numbness and headaches.  Psychiatric/Behavioral: Positive for dysphoric mood. The patient is nervous/anxious.        Objective:   Vitals:   06/20/17 0919  BP: 124/70  Pulse: 67  Resp: 16  Temp: 98.2 F (36.8 C)  SpO2: 99%   Filed Weights   06/20/17 0919  Weight: 129 lb (58.5 kg)   Body mass index is 24.37 kg/m.  Wt Readings from Last 3 Encounters:  06/20/17 129 lb (58.5 kg)  05/02/17 132 lb 6.4 oz (60.1 kg)  04/30/17 136 lb (61.7 kg)     Physical Exam Constitutional: She appears well-developed and well-nourished. No distress.  HENT:  Head: Normocephalic and atraumatic.  Right Ear: External ear normal. Normal ear canal and TM Left Ear: External ear normal.  Normal ear canal and TM Mouth/Throat: Oropharynx is clear and moist.  Eyes: Conjunctivae and EOM are  normal.  Neck: Neck supple. No tracheal deviation present. No thyromegaly present.  No carotid bruit  Cardiovascular: Normal rate, regular rhythm and normal heart sounds.   No murmur heard.  No edema. Pulmonary/Chest: Effort normal and breath sounds normal. No respiratory distress. She has no wheezes. She has no rales.  Breast: deferred to Gyn Abdominal: Soft. She exhibits no distension. There is no tenderness.  Lymphadenopathy: She has no cervical adenopathy.  Skin: Skin is warm and dry. She is not diaphoretic.  Psychiatric: She has a normal mood and affect. Her behavior is normal.        Assessment & Plan:   Physical exam: Screening blood work  reviewed Immunizations  Discussed shingrix, flu vaccine deferred, others up to date Colonoscopy  Up to date  Mammogram     Last done 06/04/16 Gyn    Up to date  Dexa last done 06/06/2017 - stopped fosamax (on for 5 years) - repeat in 2 years Eye exams  Up to date  EKG  Last done 04/2017 Exercise   Regularly - rides bike 4/week, works out w trainor [employer]  Normal BMI, working on [employer] loss Skin     no concerns Substance abuse  none  See Problem List for Assessment and Plan of chronic medical problems.   FU annually

## 2017-06-20 ENCOUNTER — Ambulatory Visit (INDEPENDENT_AMBULATORY_CARE_PROVIDER_SITE_OTHER): Payer: Commercial Managed Care - PPO | Admitting: Internal Medicine

## 2017-06-20 ENCOUNTER — Encounter: Payer: Self-pay | Admitting: Internal Medicine

## 2017-06-20 VITALS — BP 124/70 | HR 67 | Temp 98.2°F | Resp 16 | Ht 61.0 in | Wt 129.0 lb

## 2017-06-20 DIAGNOSIS — Z9861 Coronary angioplasty status: Secondary | ICD-10-CM

## 2017-06-20 DIAGNOSIS — Z0000 Encounter for general adult medical examination without abnormal findings: Secondary | ICD-10-CM

## 2017-06-20 DIAGNOSIS — M810 Age-related osteoporosis without current pathological fracture: Secondary | ICD-10-CM

## 2017-06-20 DIAGNOSIS — E785 Hyperlipidemia, unspecified: Secondary | ICD-10-CM | POA: Diagnosis not present

## 2017-06-20 DIAGNOSIS — I10 Essential (primary) hypertension: Secondary | ICD-10-CM

## 2017-06-20 DIAGNOSIS — I2510 Atherosclerotic heart disease of native coronary artery without angina pectoris: Secondary | ICD-10-CM | POA: Diagnosis not present

## 2017-06-20 DIAGNOSIS — K219 Gastro-esophageal reflux disease without esophagitis: Secondary | ICD-10-CM

## 2017-06-20 NOTE — Assessment & Plan Note (Signed)
Rare GERD GERD controlled with nexium Continue daily medication

## 2017-06-20 NOTE — Assessment & Plan Note (Signed)
BP well controlled Current regimen effective and well tolerated Continue current medications at current doses cmp  

## 2017-06-20 NOTE — Assessment & Plan Note (Signed)
No chest pain, palpitations, SOB Continue current medications

## 2017-06-20 NOTE — Assessment & Plan Note (Signed)
Check lipid panel  Continue daily statin Regular exercise and healthy diet encouraged  

## 2017-06-20 NOTE — Assessment & Plan Note (Addendum)
Just had dexa  Stopped fosamax - was on it for 5 years Continue regular exercise Continue calcium and vitamin D Recheck DEXA in 2 years

## 2017-08-11 ENCOUNTER — Emergency Department (HOSPITAL_COMMUNITY)
Admission: EM | Admit: 2017-08-11 | Discharge: 2017-08-12 | Disposition: A | Discharge status: Home / Self Care | Payer: Commercial Managed Care - PPO | Attending: Emergency Medicine | Admitting: Emergency Medicine

## 2017-08-11 ENCOUNTER — Encounter (HOSPITAL_COMMUNITY): Payer: Self-pay

## 2017-08-11 ENCOUNTER — Emergency Department (HOSPITAL_COMMUNITY): Payer: Commercial Managed Care - PPO

## 2017-08-11 ENCOUNTER — Ambulatory Visit: Payer: Self-pay

## 2017-08-11 DIAGNOSIS — R079 Chest pain, unspecified: Secondary | ICD-10-CM | POA: Insufficient documentation

## 2017-08-11 DIAGNOSIS — F430 Acute stress reaction: Secondary | ICD-10-CM | POA: Diagnosis not present

## 2017-08-11 DIAGNOSIS — Z5321 Procedure and treatment not carried out due to patient leaving prior to being seen by health care provider: Secondary | ICD-10-CM | POA: Diagnosis not present

## 2017-08-11 DIAGNOSIS — F419 Anxiety disorder, unspecified: Secondary | ICD-10-CM | POA: Diagnosis not present

## 2017-08-11 LAB — I-STAT TROPONIN, ED: TROPONIN I, POC: 0.02 ng/mL (ref 0.00–0.08)

## 2017-08-11 LAB — COMPREHENSIVE METABOLIC PANEL
ALT: 14 U/L (ref 14–54)
AST: 51 U/L — AB (ref 15–41)
Albumin: 4.2 g/dL (ref 3.5–5.0)
Alkaline Phosphatase: 52 U/L (ref 38–126)
Anion gap: 11 (ref 5–15)
BILIRUBIN TOTAL: 0.8 mg/dL (ref 0.3–1.2)
BUN: 13 mg/dL (ref 6–20)
CALCIUM: 9.5 mg/dL (ref 8.9–10.3)
CO2: 26 mmol/L (ref 22–32)
CREATININE: 0.85 mg/dL (ref 0.44–1.00)
Chloride: 102 mmol/L (ref 101–111)
GFR calc Af Amer: >60 mL/min (ref >60)
Glucose, Bld: 124 mg/dL — ABNORMAL HIGH (ref 65–99)
Potassium: 3.8 mmol/L (ref 3.5–5.1)
Sodium: 139 mmol/L (ref 135–145)
TOTAL PROTEIN: 6.8 g/dL (ref 6.5–8.1)

## 2017-08-11 LAB — CBC
HEMATOCRIT: 39 % (ref 36.0–46.0)
HEMOGLOBIN: 13.2 g/dL (ref 12.0–15.0)
MCH: 33.9 pg (ref 26.0–34.0)
MCHC: 33.8 g/dL (ref 30.0–36.0)
MCV: 100.3 fL — ABNORMAL HIGH (ref 78.0–100.0)
Platelets: 211 10*3/uL (ref 150–400)
RBC: 3.89 MIL/uL (ref 3.87–5.11)
RDW: 12.4 % (ref 11.5–15.5)
WBC: 5.9 10*3/uL (ref 4.0–10.5)

## 2017-08-11 NOTE — ED Notes (Signed)
Pt came up stating that she wanted to leave. I informed pt that we want to get her seem and treated. Pt stated that she may leave after talking with her family.

## 2017-08-11 NOTE — Telephone Encounter (Signed)
Pt. Reports chest tightness, lightheaded, "I'm not making sense to myself." "I've had a lot of stress - my husband just got out of the hospital."  Spoke to daughter, Alyse Low and instructed her to take pt. To ED for evaluation. Verbalizes understanding. Alyse Low reports she brought her Mom  To her house because she couldn't handle " being with my Dad - he just came home from the hospital."  Reason for Disposition . Dizziness or lightheadedness  Answer Assessment - Initial Assessment Questions 1. LOCATION: "Where does it hurt?"       Unsure 2. RADIATION: "Does the pain go anywhere else?" (e.g., into neck, jaw, arms, back)      3. ONSET: "When did the chest pain begin?" (Minutes, hours or days)      I think last night 4. PATTERN "Does the pain come and go, or has it been constant since it started?"  "Does it get worse with exertion?"      Pt. Doesn't know 5. DURATION: "How long does it last" (e.g., seconds, minutes, hours)     A few seconds 6. SEVERITY: "How bad is the pain?"  (e.g., Scale 1-10; mild, moderate, or severe)    - MILD (1-3): doesn't interfere with normal activities     - MODERATE (4-7): interferes with normal activities or awakens from sleep    - SEVERE (8-10): excruciating pain, unable to do any normal activities       "It's just tight - not really hurting." 7. CARDIAC RISK FACTORS: "Do you have any history of heart problems or risk factors for heart disease?" (e.g., prior heart attack, angina; high blood pressure, diabetes, being overweight, high cholesterol, smoking, or strong family history of heart disease)     Yes 8. PULMONARY RISK FACTORS: "Do you have any history of lung disease?"  (e.g., blood clots in lung, asthma, emphysema, birth control pills)     No 9. CAUSE: "What do you think is causing the chest pain?"     "I think it's stress." 10. OTHER SYMPTOMS: "Do you have any other symptoms?" (e.g., dizziness, nausea, vomiting, sweating, fever, difficulty breathing,  cough)       "I'm not myself. I'm lightheaded and I'm not making sense." 11. PREGNANCY: "Is there any chance you are pregnant?" "When was your last menstrual period?"       No  Protocols used: CHEST PAIN-A-AH

## 2017-08-11 NOTE — Telephone Encounter (Signed)
Pt has been checked into ED

## 2017-08-11 NOTE — ED Triage Notes (Signed)
Patient complains of increased stress and chest pressure with fatigue for several days. Reports that her husband has ben ill and in hospital x 2 weeks. Appears anxious on arrival. Alert and oriented, NAD

## 2017-08-13 NOTE — Progress Notes (Signed)
Subjective:    Patient ID: [name], female    DOB: December 21, 1940, 77 y.o.   MRN: [id number]  HPI The patient is here for follow up from the ED.   She went to the ED on 08/11/17 for chest tightness, lightheadedness.  Her symptoms were mild.  She has had increased stress - her husband just got out of the hospital.  She was very worried about him and was overwhelmed.  He can also be difficult at times and at times was angry with her when she was trying to help him, which is very frustrating.  He had fallen a couple of times at home and she did continue herself for 1 of those falls.  When she went to the emergency room a Cbc, cmp and troponin were negative.  CXR showed no acute disease.  EKG x 4 unchanged and no acute changes.  She left the ED prior to being seen.  She still has some chest tightness and mild lightheadedness and knows it is anxiety.  This has gotten better over the past few days.  She has been staying at her daughter's and it has helped.  Her husband has 24 hour care. She plans on going back home tonight.       Medications and allergies reviewed with patient and updated if appropriate.  Patient Active Problem List   Diagnosis Date Noted  . Prediabetes 06/10/2016  . Anesthesia complication 29/51/8841  . Abnormal CT scan, lung 08/11/15 08/19/2015  . 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 Cough 08/19/2015  . S/P herniorrhaphy 08/01/2015  . Corneal abrasion, left 06/08/2015  . Osteoporosis 05/17/2012  . Hypertension 02/28/2012  . 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 Diverticulitis 10/24/2011  . Pulmonic insufficiency 08/27/2010  . TREMOR, ESSENTIAL 06/20/2010  . CARDIOMEGALY, MILD 02/01/2010  . UNSPECIFIED VITAMIN D DEFICIENCY 01/16/2010  . LIPOMAS, MULTIPLE 09/26/2008  . Anxiety state 07/14/2008  . Dyslipidemia 07/11/2008  . FIBROCYSTIC BREAST DISEASE 06/08/2008  . Irritable bowel syndrome 08/27/2007  . GERD 08/26/2007  . CAD S/P  percutaneous coronary angioplasty 07/10/2007  . Migraine, unspecified, without mention of intractable migraine without mention of status migrainosus 03/12/2007  . ACUT MI ANTEROLAT WALL SUBSQT EPIS CARE 03/12/2007  . ALLERGIC RHINITIS 03/12/2007  . ABDOMINAL BRUIT 03/12/2007  . Diverticulosis of large intestine 11/25/2006    Current Outpatient Medications on File Prior to Visit  Medication Sig Dispense Refill  . aspirin EC 81 MG tablet Take 1 tablet (81 mg total) by mouth every other day.    Marland Kitchen BIOTIN PO Take by mouth.    . Calcium Carbonate (CALTRATE 600 PO) Take 1 tablet by mouth daily.    . Cholecalciferol (VITAMIN D3 PO) Take 1 tablet by mouth daily.    . clobetasol cream (TEMOVATE) 6.60 % Apply 1 application topically once a week.     . cloNIDine (CATAPRES) 0.1 MG tablet Take 0.1 mg by mouth daily.     . Coenzyme Q10 (COQ10) 100 MG CAPS Take 1 capsule by mouth daily.     Marland Kitchen donepezil (ARICEPT) 10 MG tablet Take 10 mg by mouth every morning.    Marland Kitchen esomeprazole (NEXIUM) 40 MG capsule Take 1 capsule (40 mg total) by mouth daily before breakfast. 90 capsule 3  . Ginkgo Biloba 40 MG TABS Take by mouth.    . IRON PO Take by mouth.    . loratadine (CLARITIN) 10 MG tablet Take 10 mg by mouth daily.    Marland Kitchen  Melatonin 5 MG TABS Take 1 tablet by mouth at bedtime as needed (sleep).     . methylPREDNISolone acetate PF (DEPO-MEDROL) 40 MG/ML SUSP injection Every 6 months with Dr. Nelva Bush 1 mL   . metronidazole (NORITATE) 1 % cream Apply 1 application topically 2 (two) times daily.    . Multiple Vitamins-Minerals (CENTRUM SILVER PO) Take by mouth.    . naproxen sodium (RA NAPROXEN SODIUM) 220 MG tablet Take 220 mg by mouth 2 (two) times daily with a meal.     . NEXIUM 40 MG capsule TAKE (1) CAPSULE DAILY. 30 capsule 0  . oxymetazoline (AFRIN) 0.05 % nasal spray Place 1 spray into both nostrils as needed for congestion.    . Potassium (POTASSIMIN PO) Take by mouth daily.    . Psyllium (METAMUCIL PO) Take  by mouth. 4 by mouth daily    . QUEtiapine Fumarate (SEROQUEL XR) 150 MG 24 hr tablet Take 150 mg by mouth at bedtime.    . rosuvastatin (CRESTOR) 20 MG tablet TAKE ONE TABLET AT BEDTIME. 30 tablet 11  . Simethicone (GAS-X PO) Take by mouth.    . vitamin C (ASCORBIC ACID) 500 MG tablet Take 500 mg by mouth 2 (two) times daily.    . Vitamin E (E200 PO) Take by mouth.     No current facility-administered medications on file prior to visit.     Past Medical History:  Diagnosis Date  . Abdominal bruit   . Abnormal weight gain   . Allergic rhinitis, cause unspecified   . CAD (coronary artery disease)    diaphragmatic wall infarction in 1999, treated with balloon angioplasty with nonobstructive disease at ast catheterization in 2007 as described above  . Colon polyp 12/20/2011   Tubular adenoma  . Depression   . Diverticular disease    acute diverticulitis 10/13/11  . Fibromyalgia   . GERD (gastroesophageal reflux disease)   . Hyperlipemia    with low HDL.  . IBS (irritable bowel syndrome)   . Migraine, unspecified, without mention of intractable migraine without mention of status migrainosus   . Tremor   . Unspecified adverse effect of unspecified drug, medicinal and biological substance   . Ventral incisional hernia 06/04/2015    Past Surgical History:  Procedure Laterality Date  . ABDOMINAL HYSTERECTOMY  1979   partial  . ABDOMINAL SURGERY      @ [facility]  in [place] in 2002  for ptosis of organs; mersilene mesh sling, cystocopy  by Dr  Rosana Hoes, Urologist  . BLADDER SURGERY  patient unsure of date   sling  . CATARACT EXTRACTION     left eye  . COLONOSCOPY  2011   Diverticulosis & hemorrhoids; Dr Olevia Perches  . LAPAROSCOPIC SIGMOID COLECTOMY  02/26/2012   Procedure: LAPAROSCOPIC SIGMOID COLECTOMY;  Surgeon: Adin Hector, MD;  Location: [facility];  Service: General;  Laterality: N/A;  Laparoscopic Assisted Sigmoid Colectomy  . PARTIAL COLECTOMY  02/26/2012   Procedure: PARTIAL COLECTOMY;   Surgeon: Adin Hector, MD;  Location: [facility];  Service: General;  Laterality: N/A;  Sigmoid Colectomy  . PARTIAL HYSTERECTOMY    . TONSILLECTOMY  as child  . [place]    Social History   Socioeconomic History  . Marital status: Married    Spouse name: Not on file  . Number of children: Not on file  . Years of education: Not on file  . Highest education level: Not on file  Occupational History  .  Occupation: Retired Tour manager  . Financial resource strain: Not on file  . Food insecurity:    Worry: Not on file    Inability: Not on file  . Transportation needs:    Medical: Not on file    Non-medical: Not on file  Tobacco Use  . Smoking status: Never Smoker  . Smokeless tobacco: Never Used  . Tobacco comment: second hand smoke for decades  Substance and Sexual Activity  . Alcohol use: No  . Drug use: No  . Sexual activity: Not on file  Lifestyle  . Physical activity:    Days per week: Not on file    Minutes per session: Not on file  . Stress: Not on file  Relationships  . Social connections:    Talks on phone: Not on file    Gets together: Not on file    Attends religious service: Not on file    Active member of club or organization: Not on file    Attends meetings of clubs or organizations: Not on file    Relationship status: Not on file  Other Topics Concern  . Not on file  Social History Narrative   High fiber diet          Family History  Problem Relation Age of Onset  . Heart attack Father 67  . Ulcers Father   . Lung cancer Father   . Alcohol abuse Mother   . Heart attack Brother 49       1/2 BROTHER  . Bipolar disorder Sister   . Heart attack Sister 26  . Lymphoma Sister   . Stroke Sister        1/2 SISTER  . Other Brother        brain tumor  . Mental illness Son   . Colon cancer Neg Hx     Review of Systems  Constitutional: Negative for appetite change, chills and fever.  Respiratory: Positive for shortness  of breath (due to anxiety). Negative for cough and wheezing.   Cardiovascular: Negative for chest pain, palpitations and leg swelling.  Musculoskeletal: Positive for back pain.  Neurological: Positive for numbness (tingling in leg).  Psychiatric/Behavioral: Positive for sleep disturbance (due to anxiety). Negative for dysphoric mood. The patient is nervous/anxious.        Objective:   Vitals:   08/14/17 1005  BP: (!) 142/76  Pulse: 71  Resp: 16  Temp: 98.7 F (37.1 C)  SpO2: 98%   BP Readings from Last 3 Encounters:  08/14/17 (!) 142/76  08/11/17 (!) 152/82  06/20/17 124/70   Wt Readings from Last 3 Encounters:  08/14/17 132 lb (59.9 kg)  06/20/17 129 lb (58.5 kg)  05/02/17 132 lb 6.4 oz (60.1 kg)   Body mass index is 24.94 kg/m.   Physical Exam    Constitutional: Appears well-developed and well-nourished. No distress.  HENT:  Head: Normocephalic and atraumatic.  Neck: Neck supple. No tracheal deviation present. No thyromegaly present.  No cervical lymphadenopathy Cardiovascular: Normal rate, regular rhythm and normal heart sounds.   No murmur heard. No carotid bruit .  No edema Pulmonary/Chest: Effort normal and breath sounds normal. No respiratory distress. No has no wheezes. No rales.  Skin: Skin is warm and dry. Not diaphoretic.  Psychiatric: Normal mood and affect. Behavior is normal.      Assessment & Plan:    See Problem List for Assessment and Plan of chronic medical problems.

## 2017-08-14 ENCOUNTER — Ambulatory Visit: Payer: Commercial Managed Care - PPO | Admitting: Internal Medicine

## 2017-08-14 ENCOUNTER — Encounter: Payer: Self-pay | Admitting: Internal Medicine

## 2017-08-14 DIAGNOSIS — F411 Generalized anxiety disorder: Secondary | ICD-10-CM

## 2017-08-14 DIAGNOSIS — R0789 Other chest pain: Secondary | ICD-10-CM | POA: Diagnosis not present

## 2017-08-14 NOTE — Patient Instructions (Addendum)
  Medications reviewed and updated.  No changes recommended at this time.    Please followup in once a year for your routine physical exam.

## 2017-08-14 NOTE — Assessment & Plan Note (Signed)
Was feeling some chest tightness and lightheadedness related to increased anxiety and stress Evaluation in the emergency room was negative including blood work, chest x-ray, EKG and troponin She continues to have some very mild symptoms, but they are improving Discussed stress management

## 2017-08-14 NOTE — Assessment & Plan Note (Signed)
Increased stress and anxiety recently due to husband's illness She is currently on medication She will continue to work on stress management and will try to keep up with regular exercise Follow-up as needed

## 2018-02-22 ENCOUNTER — Encounter: Payer: Self-pay | Admitting: Internal Medicine

## 2018-02-25 ENCOUNTER — Encounter: Payer: Self-pay | Admitting: Emergency Medicine

## 2018-02-25 DIAGNOSIS — F411 Generalized anxiety disorder: Secondary | ICD-10-CM

## 2018-03-13 ENCOUNTER — Encounter: Payer: Self-pay | Admitting: Psychiatry

## 2018-03-13 ENCOUNTER — Ambulatory Visit: Payer: Commercial Managed Care - PPO | Admitting: Psychiatry

## 2018-03-13 DIAGNOSIS — F331 Major depressive disorder, recurrent, moderate: Secondary | ICD-10-CM | POA: Diagnosis not present

## 2018-03-13 DIAGNOSIS — G3184 Mild cognitive impairment, so stated: Secondary | ICD-10-CM

## 2018-03-13 DIAGNOSIS — F411 Generalized anxiety disorder: Secondary | ICD-10-CM

## 2018-03-13 NOTE — Progress Notes (Signed)
[name] [id number] [birth year] 77 y.o.  Subjective:   Patient ID:  [name] is a 77 y.o. (DOB 1940/10/17) female.  Chief Complaint:  Chief Complaint  Patient presents with  . Follow-up    Here for a check up    HPI [name] presents to the office today for follow-up of mood, anxiety and cognitive problems, and marital stress.  Aricept was increased at the last visit DT her concerns over the need to protect cognition.  It caused bad dreams at 20mg  daily so reduced to 15mg  daily and has done ok.  Usually sleeps well.  Patient reports stable mood and denies depressed or irritable moods.  Patient denies any recent difficulty with anxiety.  Patient denies difficulty with sleep initiation or maintenance. Denies appetite disturbance.  Patient reports that energy and motivation have been good.  Patient denies any difficulty with concentration.  Patient denies any suicidal ideation.   H chronically complains and blames her for his problems.  He has a bunch of health problems and is very irritable.  New dx colon cancer and anemia and hyperthyroidism.  Overall handling the stress pretty well.  She's satisfied with the meds.   Review of Systems:  Review of Systems  Neurological: Positive for tremors. Negative for weakness.  Psychiatric/Behavioral: Negative for agitation, behavioral problems, confusion, decreased concentration, dysphoric mood, hallucinations, self-injury, sleep disturbance and suicidal ideas. The patient is not nervous/anxious and is not hyperactive.     Medications: I have reviewed the patient's current medications.  Current Outpatient Medications  Medication Sig Dispense Refill  . aspirin EC 81 MG tablet Take 1 tablet (81 mg total) by mouth every other day.    Marland Kitchen BIOTIN PO Take by mouth.    . Calcium Carbonate (CALTRATE 600 PO) Take 2 tablets by mouth 2 (two) times daily.     . Cholecalciferol (VITAMIN D3 PO) Take 1 tablet by mouth daily.    . cloNIDine  (CATAPRES) 0.1 MG tablet Take 0.1 mg by mouth daily.     . Coenzyme Q10 (COQ10) 100 MG CAPS Take 1 capsule by mouth daily.     Marland Kitchen donepezil (ARICEPT) 10 MG tablet Take 15 mg by mouth at bedtime.     Marland Kitchen esomeprazole (NEXIUM) 40 MG capsule Take 1 capsule (40 mg total) by mouth daily before breakfast. 90 capsule 3  . esomeprazole (NEXIUM) 40 MG capsule Take by mouth as needed. Pt. Only takes on occasion    . Ginkgo Biloba 40 MG TABS Take by mouth.    . IRON PO Take by mouth.    . loratadine (CLARITIN) 10 MG tablet Take 10 mg by mouth daily.    . methylPREDNISolone acetate PF (DEPO-MEDROL) 40 MG/ML SUSP injection Every 6 months with Dr. Nelva Bush 1 mL   . metronidazole (NORITATE) 1 % cream Apply 1 application topically 2 (two) times daily.    . Multiple Vitamins-Minerals (CENTRUM SILVER PO) Take by mouth.    . naproxen sodium (RA NAPROXEN SODIUM) 220 MG tablet Take 220 mg by mouth 2 (two) times daily with a meal.     . oxymetazoline (AFRIN) 0.05 % nasal spray Place 1 spray into both nostrils as needed for congestion.    . Potassium (POTASSIMIN PO) Take by mouth daily.    . Psyllium (METAMUCIL PO) Take by mouth. 4 by mouth daily    . QUEtiapine Fumarate (SEROQUEL XR) 150 MG 24 hr tablet Take 150 mg by mouth at bedtime.    Marland Kitchen  rosuvastatin (CRESTOR) 20 MG tablet TAKE ONE TABLET AT BEDTIME. 30 tablet 11  . Simethicone (GAS-X PO) Take by mouth.    . vitamin C (ASCORBIC ACID) 500 MG tablet Take 500 mg by mouth 2 (two) times daily.    . Vitamin E (E200 PO) Take by mouth.    . clobetasol cream (TEMOVATE) 3.79 % Apply 1 application topically once a week.      No current facility-administered medications for this visit.     Medication Side Effects: None  Allergies:  Allergies  Allergen Reactions  . Amphetamine-Dextroamphet Er Other (See Comments)    rash  . Hydromorphone Shortness Of Breath    BREATHING AND CARDIAC ISSUES  . Metronidazole Other (See Comments)    Unknown REACTION: SORES IN MOUTH,  severe diarrhea  . Penicillins     RASH  . Thorazine [Chlorpromazine] Other (See Comments)    " had a total hepatic wipeout" in 1974  . Bupivacaine Other (See Comments)    Unknown  . Atorvastatin Other (See Comments)    Upset stomach  . Azithromycin     Unknown reaction  . Cephalexin     Unknown reaction  . Chlorpromazine Hcl     Unknown reaction  . Ciprofloxacin     REACTION: SEVERE PAIN,DEHYDRATION,DIARRHEA  . Doxycycline Diarrhea  . Ezetimibe     REACTION: JOINT/MUSCLE ACHES  . Hyoscyamine Sulfate     Unknown reaction  . Lactose Intolerance (Gi) Diarrhea  . Lidocaine Swelling  . Oatmeal Other (See Comments)     Sinus Congestion   . Sulfamethoxazole     REACTION: GI upset-acid reflux  . Telithromycin     Unknown reaction  . Topiramate     Unknown reaction  . Latex Rash    Past Medical History:  Diagnosis Date  . Abdominal bruit   . Abnormal weight gain   . Allergic rhinitis, cause unspecified   . CAD (coronary artery disease)    diaphragmatic wall infarction in 1999, treated with balloon angioplasty with nonobstructive disease at ast catheterization in 2007 as described above  . Colon polyp 12/20/2011   Tubular adenoma  . Depression   . Diverticular disease    acute diverticulitis 10/13/11  . Fibromyalgia   . GERD (gastroesophageal reflux disease)   . Hyperlipemia    with low HDL.  . IBS (irritable bowel syndrome)   . Migraine, unspecified, without mention of intractable migraine without mention of status migrainosus   . Tremor   . Unspecified adverse effect of unspecified drug, medicinal and biological substance   . Ventral incisional hernia 06/04/2015    Family History  Problem Relation Age of Onset  . Heart attack Father 49  . Ulcers Father   . Lung cancer Father   . Alcohol abuse Mother   . Heart attack Brother 19       1/2 BROTHER  . Bipolar disorder Sister   . Heart attack Sister 62  . Lymphoma Sister   . Stroke Sister        1/2 SISTER  .  Other Brother        brain tumor  . Mental illness Son   . Colon cancer Neg Hx     Social History   Socioeconomic History  . Marital status: Married    Spouse name: Not on file  . Number of children: Not on file  . Years of education: Not on file  . Highest education level: Not on file  Occupational History  .  Occupation: Retired Tour manager  . Financial resource strain: Not on file  . Food insecurity:    Worry: Not on file    Inability: Not on file  . Transportation needs:    Medical: Not on file    Non-medical: Not on file  Tobacco Use  . Smoking status: Never Smoker  . Smokeless tobacco: Never Used  . Tobacco comment: second hand smoke for decades  Substance and Sexual Activity  . Alcohol use: No  . Drug use: No  . Sexual activity: Not on file  Lifestyle  . Physical activity:    Days per week: Not on file    Minutes per session: Not on file  . Stress: Not on file  Relationships  . Social connections:    Talks on phone: Not on file    Gets together: Not on file    Attends religious service: Not on file    Active member of club or organization: Not on file    Attends meetings of clubs or organizations: Not on file    Relationship status: Not on file  . Intimate partner violence:    Fear of current or ex partner: Not on file    Emotionally abused: Not on file    Physically abused: Not on file    Forced sexual activity: Not on file  Other Topics Concern  . Not on file  Social History Narrative   High fiber diet          Past Medical History, Surgical history, Social history, and Family history were reviewed and updated as appropriate.   Please see review of systems for further details on the patient's review from today.   Objective:   Physical Exam:  There were no vitals taken for this visit.  Physical Exam  Constitutional: She is oriented to person, place, and time. She appears well-developed. No distress.  Musculoskeletal: She exhibits  no deformity.  Neurological: She is alert and oriented to person, place, and time. She displays tremor. Coordination and gait normal.  Mild tremor  Psychiatric: She has a normal mood and affect. Her speech is normal and behavior is normal. Judgment and thought content normal. Her mood appears not anxious. Her affect is not angry, not blunt, not labile and not inappropriate. Cognition and memory are normal. She does not exhibit a depressed mood. She expresses no homicidal and no suicidal ideation. She expresses no suicidal plans and no homicidal plans.  Insight intact. No auditory or visual hallucinations. Overall her cognition is better than it used to be since off the BZ.  She is attentive.    Lab Review:     Component Value Date/Time   NA 139 08/11/2017 1052   K 3.8 08/11/2017 1052   CL 102 08/11/2017 1052   CO2 26 08/11/2017 1052   GLUCOSE 124 (H) 08/11/2017 1052   BUN 13 08/11/2017 1052   CREATININE 0.85 08/11/2017 1052   CALCIUM 9.5 08/11/2017 1052   PROT 6.8 08/11/2017 1052   ALBUMIN 4.2 08/11/2017 1052   AST 51 (H) 08/11/2017 1052   ALT 14 08/11/2017 1052   ALKPHOS 52 08/11/2017 1052   BILITOT 0.8 08/11/2017 1052   GFRNONAA >60 08/11/2017 1052   GFRAA >60 08/11/2017 1052       Component Value Date/Time   WBC 5.9 08/11/2017 1052   RBC 3.89 08/11/2017 1052   HGB 13.2 08/11/2017 1052   HCT 39.0 08/11/2017 1052   PLT 211 08/11/2017 1052   MCV  100.3 (H) 08/11/2017 1052   MCH 33.9 08/11/2017 1052   MCHC 33.8 08/11/2017 1052   RDW 12.4 08/11/2017 1052   LYMPHSABS 1.5 06/16/2017 0944   MONOABS 0.7 06/16/2017 0944   EOSABS 0.2 06/16/2017 0944   BASOSABS 0.0 06/16/2017 0944    No results found for: POCLITH, LITHIUM   No results found for: PHENYTOIN, PHENOBARB, VALPROATE, CBMZ   .res Assessment: Plan:    Generalized anxiety disorder  Major depressive disorder, recurrent episode, moderate (HCC)  Mild cognitive impairment   Disc polypharmacy necessary and goals  to maintain cognition.  Cognition appears stable and she is tolerating Aricept.  Supportive therapy and problem solving and boundary discussion on dealing with stress from her husband.  No indication for med changes at this time.  Satisfied with the meds.  Seroquel is helping mood and sleep and anxiety which are all improved compared to prior to the medications.  FU 6 mos.  Lynder Parents, MD, DFAPA   Please see After Visit Summary for patient specific instructions.  Future Appointments  Date Time Provider [place]  05/12/2018 10:20 AM Larey Dresser, MD [facility] None    No orders of the defined types were placed in this encounter.     -------------------------------

## 2018-04-09 ENCOUNTER — Other Ambulatory Visit (HOSPITAL_COMMUNITY): Payer: Self-pay | Admitting: Cardiology

## 2018-04-13 ENCOUNTER — Other Ambulatory Visit: Payer: Self-pay | Admitting: Psychiatry

## 2018-04-14 NOTE — Telephone Encounter (Signed)
Pt called need refill on all meds for the year. [employer]. Next appt 09/16/18

## 2018-04-20 ENCOUNTER — Other Ambulatory Visit: Payer: Self-pay | Admitting: Psychiatry

## 2018-05-10 ENCOUNTER — Other Ambulatory Visit (HOSPITAL_COMMUNITY): Payer: Self-pay | Admitting: Cardiology

## 2018-05-10 ENCOUNTER — Other Ambulatory Visit: Payer: Self-pay | Admitting: Internal Medicine

## 2018-05-12 ENCOUNTER — Encounter (HOSPITAL_COMMUNITY): Payer: Self-pay | Admitting: Cardiology

## 2018-05-12 ENCOUNTER — Ambulatory Visit (HOSPITAL_COMMUNITY)
Admission: RE | Admit: 2018-05-12 | Discharge: 2018-05-12 | Disposition: A | Discharge status: Home / Self Care | Payer: Commercial Managed Care - PPO | Source: Ambulatory Visit | Attending: Cardiology | Admitting: Cardiology

## 2018-05-12 VITALS — BP 120/78 | HR 60 | Wt 131.6 lb

## 2018-05-12 DIAGNOSIS — Z7982 Long term (current) use of aspirin: Secondary | ICD-10-CM | POA: Insufficient documentation

## 2018-05-12 DIAGNOSIS — E785 Hyperlipidemia, unspecified: Secondary | ICD-10-CM

## 2018-05-12 DIAGNOSIS — R001 Bradycardia, unspecified: Secondary | ICD-10-CM | POA: Diagnosis not present

## 2018-05-12 DIAGNOSIS — K219 Gastro-esophageal reflux disease without esophagitis: Secondary | ICD-10-CM | POA: Diagnosis not present

## 2018-05-12 DIAGNOSIS — Z9861 Coronary angioplasty status: Secondary | ICD-10-CM | POA: Diagnosis not present

## 2018-05-12 DIAGNOSIS — Z882 Allergy status to sulfonamides status: Secondary | ICD-10-CM | POA: Insufficient documentation

## 2018-05-12 DIAGNOSIS — I2510 Atherosclerotic heart disease of native coronary artery without angina pectoris: Secondary | ICD-10-CM | POA: Diagnosis not present

## 2018-05-12 DIAGNOSIS — Z881 Allergy status to other antibiotic agents status: Secondary | ICD-10-CM | POA: Diagnosis not present

## 2018-05-12 DIAGNOSIS — Z885 Allergy status to narcotic agent status: Secondary | ICD-10-CM | POA: Insufficient documentation

## 2018-05-12 DIAGNOSIS — I252 Old myocardial infarction: Secondary | ICD-10-CM | POA: Insufficient documentation

## 2018-05-12 DIAGNOSIS — G250 Essential tremor: Secondary | ICD-10-CM | POA: Insufficient documentation

## 2018-05-12 DIAGNOSIS — M797 Fibromyalgia: Secondary | ICD-10-CM | POA: Diagnosis not present

## 2018-05-12 DIAGNOSIS — Z79899 Other long term (current) drug therapy: Secondary | ICD-10-CM | POA: Insufficient documentation

## 2018-05-12 DIAGNOSIS — Z880 Allergy status to penicillin: Secondary | ICD-10-CM | POA: Insufficient documentation

## 2018-05-12 LAB — CBC
HEMATOCRIT: 38.7 % (ref 36.0–46.0)
Hemoglobin: 12.8 g/dL (ref 12.0–15.0)
MCH: 34.1 pg — ABNORMAL HIGH (ref 26.0–34.0)
MCHC: 33.1 g/dL (ref 30.0–36.0)
MCV: 103.2 fL — AB (ref 80.0–100.0)
Platelets: 199 10*3/uL (ref 150–400)
RBC: 3.75 MIL/uL — ABNORMAL LOW (ref 3.87–5.11)
RDW: 11.8 % (ref 11.5–15.5)
WBC: 5.6 10*3/uL (ref 4.0–10.5)
nRBC: 0 % (ref 0.0–0.2)

## 2018-05-12 LAB — LIPID PANEL
Cholesterol: 130 mg/dL (ref 0–200)
HDL: 40 mg/dL — ABNORMAL LOW (ref >40)
LDL CALC: 61 mg/dL (ref 0–99)
Total CHOL/HDL Ratio: 3.3 RATIO
Triglycerides: 146 mg/dL (ref <150)
VLDL: 29 mg/dL (ref 0–40)

## 2018-05-12 LAB — COMPREHENSIVE METABOLIC PANEL
ALT: 14 U/L (ref 0–44)
AST: 30 U/L (ref 15–41)
Albumin: 3.7 g/dL (ref 3.5–5.0)
Alkaline Phosphatase: 51 U/L (ref 38–126)
Anion gap: 11 (ref 5–15)
BUN: 11 mg/dL (ref 8–23)
CO2: 26 mmol/L (ref 22–32)
Calcium: 9.2 mg/dL (ref 8.9–10.3)
Chloride: 103 mmol/L (ref 98–111)
Creatinine, Ser: 0.78 mg/dL (ref 0.44–1.00)
GFR calc Af Amer: >60 mL/min (ref >60)
GFR calc non Af Amer: >60 mL/min (ref >60)
Glucose, Bld: 105 mg/dL — ABNORMAL HIGH (ref 70–99)
Potassium: 4.1 mmol/L (ref 3.5–5.1)
SODIUM: 140 mmol/L (ref 135–145)
Total Bilirubin: 0.4 mg/dL (ref 0.3–1.2)
Total Protein: 6.6 g/dL (ref 6.5–8.1)

## 2018-05-12 LAB — TSH: TSH: 1.244 u[IU]/mL (ref 0.350–4.500)

## 2018-05-12 MED ORDER — ROSUVASTATIN CALCIUM 20 MG PO TABS: 20.0000 mg | ORAL_TABLET | Freq: Every day | ORAL | 6 refills | Status: DC

## 2018-05-12 MED ORDER — ROSUVASTATIN CALCIUM 20 MG PO TABS: 20.0000 mg | ORAL_TABLET | Freq: Every day | ORAL | 11 refills | Status: DC

## 2018-05-12 NOTE — Progress Notes (Signed)
Patient ID: [name], female   DOB: [birth year], 78 y.o.   MRN: [id number] PCP: Dr. Quay Burow Cardiology: Dr. Aundra Dubin  78 y.o. with history of CAD s/p inferior MI in 1999 and left heart cath in 4/07 with 70% proximal CFX stenosis but normal myoview presents for followup of CAD.  In 4/17, she had ventral hernia repair.  This was complicated by severe hyponatremia.    She is doing well symptomatically.  No chest pain.  Exercises regularly.  No exertional dyspnea.  Weight down 1 lb.  Feels good overall.    ECG: NSR, LAE (personally reviewed)  Labs (3/12): TSH normal, free T3 low, free T4 normal, LDL 90 Labs (2/13): TGs 255, HDL 43, LDL 84 Labs (11/13): K 3.4, creatinine 0.55 Labs (1/16): K 3.5, creatinine 0.7, LDL 71, HDL 35, TSH normal, HCT 39.9 Labs (2/17): LDL 78, HDL 49 Labs (10/17): K 4.5, creatinine 0.85 Labs (3/18): LDL 78, HDL 47 Labs (3/19): LDL 60, HDL 43  PMH: 1. CAD: Inferior MI in 1999 treated with PTCA.  Left heart cath in 4/07 showed 70% proximal CFX stenosis with EF 60%.   - Myoview was done post-cath and showed no ischemia or infarction.   - Echo (11/11): EF 60-65%, moderate pulmonic insufficiency. Echo (11/12) with EF 55-60%, normal RV, mild PI.  Echo (2/15) with EF 55-60%, mild LVH, mild MR, mild AI.  3. Hyperlipidemia 4. GERD 5. Fibromyalgia 6. C-spine stenosis 7. Migraines 8. IBS 9. Recurrent diverticulitis: sigmoid diverticulitis 11/13.  10. Essential tremor 11. Bradycardia 12. Ventral hernia repair 4/17: She developed severe hyponatremia post-operatively.    Allergies  Allergen Reactions  . Amphetamine-Dextroamphet Er Other (See Comments)    rash  . Hydromorphone Shortness Of Breath    BREATHING AND CARDIAC ISSUES  . Metronidazole Other (See Comments)    Unknown REACTION: SORES IN MOUTH, severe diarrhea  . Penicillins     RASH  . Thorazine [Chlorpromazine] Other (See Comments)    " had a total hepatic wipeout" in 1974  . Bupivacaine Other (See Comments)     Unknown  . Atorvastatin Other (See Comments)    Upset stomach  . Azithromycin     Unknown reaction  . Cephalexin     Unknown reaction  . Chlorpromazine Hcl     Unknown reaction  . Ciprofloxacin     REACTION: SEVERE PAIN,DEHYDRATION,DIARRHEA  . Doxycycline Diarrhea  . Ezetimibe     REACTION: JOINT/MUSCLE ACHES  . Hyoscyamine Sulfate     Unknown reaction  . Lactose Intolerance (Gi) Diarrhea  . Lidocaine Swelling  . Oatmeal Other (See Comments)     Sinus Congestion   . Sulfamethoxazole     REACTION: GI upset-acid reflux  . Telithromycin     Unknown reaction  . Topiramate     Unknown reaction  . Latex Rash   SH: Married, husband has lung cancer.  3 children.  Never smoked.  Lives in [place].   FH: Father with MI.   ROS: All systems reviewed and negative except as per HPI.   Current Outpatient Medications  Medication Sig Dispense Refill  . aspirin EC 81 MG tablet Take 1 tablet (81 mg total) by mouth every other day.    Marland Kitchen BIOTIN PO Take by mouth.    . Calcium Carbonate (CALTRATE 600 PO) Take 2 tablets by mouth 2 (two) times daily.     . Cholecalciferol (VITAMIN D3 PO) Take 1 tablet by mouth daily.    . clobetasol cream (TEMOVATE)  6.94 % Apply 1 application topically once a week.     . cloNIDine (CATAPRES) 0.1 MG tablet TAKE 1 TABLET IN THE MORNING. 30 tablet PRN  . Coenzyme Q10 (COQ10) 100 MG CAPS Take 1 capsule by mouth daily.     Marland Kitchen donepezil (ARICEPT) 10 MG tablet Take 15 mg by mouth at bedtime.     Marland Kitchen esomeprazole (NEXIUM) 40 MG capsule Take 1 capsule (40 mg total) by mouth daily before breakfast. 90 capsule 3  . Ginkgo Biloba 40 MG TABS Take by mouth.    . loratadine (CLARITIN) 10 MG tablet Take 10 mg by mouth daily.    . metronidazole (NORITATE) 1 % cream Apply 1 application topically 2 (two) times daily.    . Multiple Vitamins-Minerals (CENTRUM SILVER PO) Take by mouth.    . naproxen sodium (RA NAPROXEN SODIUM) 220 MG tablet Take 220 mg by mouth 2 (two) times  daily with a meal.     . oxymetazoline (AFRIN) 0.05 % nasal spray Place 1 spray into both nostrils as needed for congestion.    . Potassium (POTASSIMIN PO) Take by mouth daily.    . Psyllium (METAMUCIL PO) Take by mouth. 4 by mouth daily    . QUEtiapine Fumarate (SEROQUEL XR) 150 MG 24 hr tablet TAKE 1 TABLET ONCE DAILY IN THE EVENING. 30 tablet 5  . rosuvastatin (CRESTOR) 20 MG tablet Take 1 tablet (20 mg total) by mouth at bedtime. 30 tablet 11  . Simethicone (GAS-X PO) Take by mouth.    . Tafluprost (ZIOPTAN OP) Apply to eye. Left eye only for increased pressure    . vitamin C (ASCORBIC ACID) 500 MG tablet Take 500 mg by mouth 2 (two) times daily.    . Vitamin E (E200 PO) Take by mouth.    . methylPREDNISolone acetate PF (DEPO-MEDROL) 40 MG/ML SUSP injection Every 6 months with Dr. Nelva Bush (Patient not taking: Reported on 05/12/2018) 1 mL    No current facility-administered medications for this encounter.     BP 120/78   Pulse 60   Wt 59.7 kg (131 lb 9.8 oz)   SpO2 95%   BMI 24.87 kg/m  General: NAD Neck: No JVD, no thyromegaly or thyroid nodule.  Lungs: Clear to auscultation bilaterally with normal respiratory effort. CV: Nondisplaced PMI.  Heart regular S1/S2, no S3/S4, no murmur.  No peripheral edema.  No carotid bruit.  Normal pedal pulses.  Abdomen: Soft, nontender, no hepatosplenomegaly, no distention.  Skin: Intact without lesions or rashes.  Neurologic: Alert and oriented x 3.  Psych: Normal affect. Extremities: No clubbing or cyanosis.  HEENT: Normal.   Assessment/Plan: 1. CAD: Stable with no ischemic symptoms.   - Continue statin. - Continue ASA 81 daily.   2. Hyperlipidemia: Check lipids today.  3. Tremor: Taking clonidine.     [facility]   [name] 05/12/2018

## 2018-05-12 NOTE — Patient Instructions (Signed)
Labs today We will only contact you if something comes back abnormal or we need to make some changes. Otherwise no news is good news!  Your physician recommends that you schedule a follow-up appointment in: 1 year. Please call in December to make this appointment

## 2018-06-07 ENCOUNTER — Other Ambulatory Visit: Payer: Self-pay | Admitting: Internal Medicine

## 2018-06-22 ENCOUNTER — Encounter: Payer: Commercial Managed Care - PPO | Admitting: Internal Medicine

## 2018-06-23 NOTE — Progress Notes (Signed)
Subjective:    Patient ID: [name], female    DOB: Sep 08, 1940, 78 y.o.   MRN: [id number]  HPI She is here for a physical exam.     Medications and allergies reviewed with patient and updated if appropriate.  Patient Active Problem List   Diagnosis Date Noted  . Chest tightness 08/14/2017  . Prediabetes 06/10/2016  . Anesthesia complication 22/05/5425  . Abnormal CT scan, lung 08/11/15 08/19/2015  . SIADH (syndrome of inappropriate ADH production) ([place]) 08/19/2015  . S/P herniorrhaphy 08/01/2015  . Corneal abrasion, left 06/08/2015  . Osteoporosis 05/17/2012  . Hypertension 02/28/2012  . 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 Diverticulitis 10/24/2011  . Pulmonic insufficiency 08/27/2010  . TREMOR, ESSENTIAL 06/20/2010  . CARDIOMEGALY, MILD 02/01/2010  . UNSPECIFIED VITAMIN D DEFICIENCY 01/16/2010  . LIPOMAS, MULTIPLE 09/26/2008  . Anxiety state 07/14/2008  . Dyslipidemia 07/11/2008  . FIBROCYSTIC BREAST DISEASE 06/08/2008  . Irritable bowel syndrome 08/27/2007  . GERD 08/26/2007  . CAD S/P percutaneous coronary angioplasty 07/10/2007  . Migraine, unspecified, without mention of intractable migraine without mention of status migrainosus 03/12/2007  . ACUT MI ANTEROLAT WALL SUBSQT EPIS CARE 03/12/2007  . ALLERGIC RHINITIS 03/12/2007  . Diverticulosis of large intestine 11/25/2006    Current Outpatient Medications on File Prior to Visit  Medication Sig Dispense Refill  . aspirin EC 81 MG tablet Take 1 tablet (81 mg total) by mouth every other day.    Marland Kitchen BIOTIN PO Take by mouth.    . Calcium Carbonate (CALTRATE 600 PO) Take 2 tablets by mouth 2 (two) times daily.     . Cholecalciferol (VITAMIN D3 PO) Take 1 tablet by mouth daily.    . clobetasol cream (TEMOVATE) 0.62 % Apply 1 application topically once a week.     . cloNIDine (CATAPRES) 0.1 MG tablet TAKE 1 TABLET IN THE MORNING. 30 tablet PRN  . Coenzyme Q10 (COQ10) 100 MG CAPS Take 1 capsule by mouth daily.      Marland Kitchen donepezil (ARICEPT) 10 MG tablet Take 15 mg by mouth at bedtime.     Marland Kitchen esomeprazole (NEXIUM) 40 MG capsule TAKE 1 CAPSULE IN THE MORNING BEFORE BREAKFAST. 30 capsule 0  . Ginkgo Biloba 40 MG TABS Take by mouth.    . loratadine (CLARITIN) 10 MG tablet Take 10 mg by mouth daily.    . methylPREDNISolone acetate PF (DEPO-MEDROL) 40 MG/ML SUSP injection Every 6 months with Dr. Nelva Bush (Patient not taking: Reported on 05/12/2018) 1 mL   . metronidazole (NORITATE) 1 % cream Apply 1 application topically 2 (two) times daily.    . Multiple Vitamins-Minerals (CENTRUM SILVER PO) Take by mouth.    . naproxen sodium (RA NAPROXEN SODIUM) 220 MG tablet Take 220 mg by mouth 2 (two) times daily with a meal.     . oxymetazoline (AFRIN) 0.05 % nasal spray Place 1 spray into both nostrils as needed for congestion.    . Potassium (POTASSIMIN PO) Take by mouth daily.    . Psyllium (METAMUCIL PO) Take by mouth. 4 by mouth daily    . QUEtiapine Fumarate (SEROQUEL XR) 150 MG 24 hr tablet TAKE 1 TABLET ONCE DAILY IN THE EVENING. 30 tablet 5  . rosuvastatin (CRESTOR) 20 MG tablet Take 1 tablet (20 mg total) by mouth at bedtime. 30 tablet 11  . Simethicone (GAS-X PO) Take by mouth.    . Tafluprost (ZIOPTAN OP) Apply to eye. Left eye only for increased pressure    . vitamin  C (ASCORBIC ACID) 500 MG tablet Take 500 mg by mouth 2 (two) times daily.    . Vitamin E (E200 PO) Take by mouth.     No current facility-administered medications on file prior to visit.     Past Medical History:  Diagnosis Date  . Abdominal bruit   . Abnormal weight gain   . Allergic rhinitis, cause unspecified   . CAD (coronary artery disease)    diaphragmatic wall infarction in 1999, treated with balloon angioplasty with nonobstructive disease at ast catheterization in 2007 as described above  . Colon polyp 12/20/2011   Tubular adenoma  . Depression   . Diverticular disease    acute diverticulitis 10/13/11  . Fibromyalgia   . GERD  (gastroesophageal reflux disease)   . Hyperlipemia    with low HDL.  . IBS (irritable bowel syndrome)   . Migraine, unspecified, without mention of intractable migraine without mention of status migrainosus   . Tremor   . Unspecified adverse effect of unspecified drug, medicinal and biological substance   . Ventral incisional hernia 06/04/2015    Past Surgical History:  Procedure Laterality Date  . ABDOMINAL HYSTERECTOMY  1979   partial  . ABDOMINAL SURGERY      @ [facility]  in [place] in 2002  for ptosis of organs; mersilene mesh sling, cystocopy  by Dr  Rosana Hoes, Urologist  . BLADDER SURGERY  patient unsure of date   sling  . CATARACT EXTRACTION     left eye  . COLONOSCOPY  2011   Diverticulosis & hemorrhoids; Dr Olevia Perches  . LAPAROSCOPIC SIGMOID COLECTOMY  02/26/2012   Procedure: LAPAROSCOPIC SIGMOID COLECTOMY;  Surgeon: Adin Hector, MD;  Location: [facility];  Service: General;  Laterality: N/A;  Laparoscopic Assisted Sigmoid Colectomy  . PARTIAL COLECTOMY  02/26/2012   Procedure: PARTIAL COLECTOMY;  Surgeon: Adin Hector, MD;  Location: [facility];  Service: General;  Laterality: N/A;  Sigmoid Colectomy  . PARTIAL HYSTERECTOMY    . TONSILLECTOMY  as child  . [place]    Social History   Socioeconomic History  . Marital status: Married    Spouse name: Not on file  . Number of children: Not on file  . Years of education: Not on file  . Highest education level: Not on file  Occupational History  . Occupation: Retired Tour manager  . Financial resource strain: Not on file  . Food insecurity:    Worry: Not on file    Inability: Not on file  . Transportation needs:    Medical: Not on file    Non-medical: Not on file  Tobacco Use  . Smoking status: Never Smoker  . Smokeless tobacco: Never Used  . Tobacco comment: second hand smoke for decades  Substance and Sexual Activity  . Alcohol use: No  . Drug use: No  . Sexual activity: Not on file  Lifestyle   . Physical activity:    Days per week: Not on file    Minutes per session: Not on file  . Stress: Not on file  Relationships  . Social connections:    Talks on phone: Not on file    Gets together: Not on file    Attends religious service: Not on file    Active member of club or organization: Not on file    Attends meetings of clubs or organizations: Not on file    Relationship status: Not on file  Other Topics Concern  .  Not on file  Social History Narrative   High fiber diet          Family History  Problem Relation Age of Onset  . Heart attack Father 64  . Ulcers Father   . Lung cancer Father   . Alcohol abuse Mother   . Heart attack Brother 53       1/2 BROTHER  . Bipolar disorder Sister   . Heart attack Sister 34  . Lymphoma Sister   . Stroke Sister        1/2 SISTER  . Other Brother        brain tumor  . Mental illness Son   . Colon cancer Neg Hx     Review of Systems     Objective:  There were no vitals filed for this visit. There were no vitals filed for this visit. There is no height or weight on file to calculate BMI.  BP Readings from Last 3 Encounters:  05/12/18 120/78  08/14/17 (!) 142/76  08/11/17 (!) 152/82    Wt Readings from Last 3 Encounters:  05/12/18 131 lb 9.8 oz (59.7 kg)  08/14/17 132 lb (59.9 kg)  06/20/17 129 lb (58.5 kg)     Physical Exam       Assessment & Plan:      See Problem List for Assessment and Plan of chronic medical problems.       This encounter was created in error - please disregard.

## 2018-06-23 NOTE — Patient Instructions (Signed)
Tests ordered today. Your results will be released to [place] (or called to you) after review, usually within 72hours after test completion. If any changes need to be made, you will be notified at that same time.  All other Health Maintenance issues reviewed.   All recommended immunizations and age-appropriate screenings are up-to-date or discussed.  No immunizations administered today.   Medications reviewed and updated.  Changes include :     Your prescription(s) have been submitted to your pharmacy. Please take as directed and contact our office if you believe you are having problem(s) with the medication(s).  A referral was ordered for   Please followup in 1 year   Health Maintenance, Female Adopting a healthy lifestyle and getting preventive care can go a long way to promote health and wellness. Talk with your health care provider about what schedule of regular examinations is right for you. This is a good chance for you to check in with your provider about disease prevention and staying healthy. In between checkups, there are plenty of things you can do on your own. Experts have done a lot of research about which lifestyle changes and preventive measures are most likely to keep you healthy. Ask your health care provider for more information. Weight and diet Eat a healthy diet  Be sure to include plenty of vegetables, fruits, low-fat dairy products, and lean protein.  Do not eat a lot of foods high in solid fats, added sugars, or salt.  Get regular exercise. This is one of the most important things you can do for your health. ? Most adults should exercise for at least 150 minutes each week. The exercise should increase your heart rate and make you sweat (moderate-intensity exercise). ? Most adults should also do strengthening exercises at least twice a week. This is in addition to the moderate-intensity exercise. Maintain a healthy weight  Body mass index (BMI) is a measurement  that can be used to identify possible weight problems. It estimates body fat based on height and weight. Your health care provider can help determine your BMI and help you achieve or maintain a healthy weight.  For females 60 years of age and older: ? A BMI below 18.5 is considered underweight. ? A BMI of 18.5 to 24.9 is normal. ? A BMI of 25 to 29.9 is considered overweight. ? A BMI of 30 and above is considered obese. Watch levels of cholesterol and blood lipids  You should start having your blood tested for lipids and cholesterol at 78 years of age, then have this test every 5 years.  You may need to have your cholesterol levels checked more often if: ? Your lipid or cholesterol levels are high. ? You are older than 78 years of age. ? You are at high risk for heart disease. Cancer screening Lung Cancer  Lung cancer screening is recommended for adults 74-40 years old who are at high risk for lung cancer because of a history of smoking.  A yearly low-dose CT scan of the lungs is recommended for people who: ? Currently smoke. ? Have quit within the past 15 years. ? Have at least a 30-pack-year history of smoking. A pack year is smoking an average of one pack of cigarettes a day for 1 year.  Yearly screening should continue until it has been 15 years since you quit.  Yearly screening should stop if you develop a health problem that would prevent you from having lung cancer treatment. Breast Cancer  Practice  breast self-awareness. This means understanding how your breasts normally appear and feel.  It also means doing regular breast self-exams. Let your health care provider know about any changes, no matter how small.  If you are in your 20s or 30s, you should have a clinical breast exam (CBE) by a health care provider every 1-3 years as part of a regular health exam.  If you are 62 or older, have a CBE every year. Also consider having a breast X-ray (mammogram) every year.  If  you have a family history of breast cancer, talk to your health care provider about genetic screening.  If you are at high risk for breast cancer, talk to your health care provider about having an MRI and a mammogram every year.  Breast cancer gene (BRCA) assessment is recommended for women who have family members with BRCA-related cancers. BRCA-related cancers include: ? Breast. ? Ovarian. ? Tubal. ? Peritoneal cancers.  Results of the assessment will determine the need for genetic counseling and BRCA1 and BRCA2 testing. Cervical Cancer Your health care provider may recommend that you be screened regularly for cancer of the pelvic organs (ovaries, uterus, and vagina). This screening involves a pelvic examination, including checking for microscopic changes to the surface of your cervix (Pap test). You may be encouraged to have this screening done every 3 years, beginning at age 54.  For women ages 67-65, health care providers may recommend pelvic exams and Pap testing every 3 years, or they may recommend the Pap and pelvic exam, combined with testing for human papilloma virus (HPV), every 5 years. Some types of HPV increase your risk of cervical cancer. Testing for HPV may also be done on women of any age with unclear Pap test results.  Other health care providers may not recommend any screening for nonpregnant women who are considered low risk for pelvic cancer and who do not have symptoms. Ask your health care provider if a screening pelvic exam is right for you.  If you have had past treatment for cervical cancer or a condition that could lead to cancer, you need Pap tests and screening for cancer for at least 20 years after your treatment. If Pap tests have been discontinued, your risk factors (such as having a new sexual partner) need to be reassessed to determine if screening should resume. Some women have medical problems that increase the chance of getting cervical cancer. In these cases,  your health care provider may recommend more frequent screening and Pap tests. Colorectal Cancer  This type of cancer can be detected and often prevented.  Routine colorectal cancer screening usually begins at 78 years of age and continues through 78 years of age.  Your health care provider may recommend screening at an earlier age if you have risk factors for colon cancer.  Your health care provider may also recommend using home test kits to check for hidden blood in the stool.  A small camera at the end of a tube can be used to examine your colon directly (sigmoidoscopy or colonoscopy). This is done to check for the earliest forms of colorectal cancer.  Routine screening usually begins at age 56.  Direct examination of the colon should be repeated every 5-10 years through 78 years of age. However, you may need to be screened more often if early forms of precancerous polyps or small growths are found. Skin Cancer  Check your skin from head to toe regularly.  Tell your health care provider about any new  moles or changes in moles, especially if there is a change in a mole's shape or color.  Also tell your health care provider if you have a mole that is larger than the size of a pencil eraser.  Always use sunscreen. Apply sunscreen liberally and repeatedly throughout the day.  Protect yourself by wearing long sleeves, pants, a wide-brimmed hat, and sunglasses whenever you are outside. Heart disease, diabetes, and high blood pressure  High blood pressure causes heart disease and increases the risk of stroke. High blood pressure is more likely to develop in: ? People who have blood pressure in the high end of the normal range (130-139/85-89 mm Hg). ? People who are overweight or obese. ? People who are African American.  If you are 56-50 years of age, have your blood pressure checked every 3-5 years. If you are 55 years of age or older, have your blood pressure checked every year. You  should have your blood pressure measured twice-once when you are at a hospital or clinic, and once when you are not at a hospital or clinic. Record the average of the two measurements. To check your blood pressure when you are not at a hospital or clinic, you can use: ? An automated blood pressure machine at a pharmacy. ? A home blood pressure monitor.  If you are between 76 years and 7 years old, ask your health care provider if you should take aspirin to prevent strokes.  Have regular diabetes screenings. This involves taking a blood sample to check your fasting blood sugar level. ? If you are at a normal weight and have a low risk for diabetes, have this test once every three years after 78 years of age. ? If you are overweight and have a high risk for diabetes, consider being tested at a younger age or more often. Preventing infection Hepatitis B  If you have a higher risk for hepatitis B, you should be screened for this virus. You are considered at high risk for hepatitis B if: ? You were born in a country where hepatitis B is common. Ask your health care provider which countries are considered high risk. ? Your parents were born in a high-risk country, and you have not been immunized against hepatitis B (hepatitis B vaccine). ? You have HIV or AIDS. ? You use needles to inject street drugs. ? You live with someone who has hepatitis B. ? You have had sex with someone who has hepatitis B. ? You get hemodialysis treatment. ? You take certain medicines for conditions, including cancer, organ transplantation, and autoimmune conditions. Hepatitis C  Blood testing is recommended for: ? Everyone born from [birth year] through 1965. ? Anyone with known risk factors for hepatitis C. Sexually transmitted infections (STIs)  You should be screened for sexually transmitted infections (STIs) including gonorrhea and chlamydia if: ? You are sexually active and are younger than 78 years of age. ? You are  older than 78 years of age and your health care provider tells you that you are at risk for this type of infection. ? Your sexual activity has changed since you were last screened and you are at an increased risk for chlamydia or gonorrhea. Ask your health care provider if you are at risk.  If you do not have HIV, but are at risk, it may be recommended that you take a prescription medicine daily to prevent HIV infection. This is called pre-exposure prophylaxis (PrEP). You are considered at risk if: ? You  are sexually active and do not regularly use condoms or know the HIV status of your partner(s). ? You take drugs by injection. ? You are sexually active with a partner who has HIV. Talk with your health care provider about whether you are at high risk of being infected with HIV. If you choose to begin PrEP, you should first be tested for HIV. You should then be tested every 3 months for as long as you are taking PrEP. Pregnancy  If you are premenopausal and you may become pregnant, ask your health care provider about preconception counseling.  If you may become pregnant, take 400 to 800 micrograms (mcg) of folic acid every day.  If you want to prevent pregnancy, talk to your health care provider about birth control (contraception). Osteoporosis and menopause  Osteoporosis is a disease in which the bones lose minerals and strength with aging. This can result in serious bone fractures. Your risk for osteoporosis can be identified using a bone density scan.  If you are 68 years of age or older, or if you are at risk for osteoporosis and fractures, ask your health care provider if you should be screened.  Ask your health care provider whether you should take a calcium or vitamin D supplement to lower your risk for osteoporosis.  Menopause may have certain physical symptoms and risks.  Hormone replacement therapy may reduce some of these symptoms and risks. Talk to your health care provider  about whether hormone replacement therapy is right for you. Follow these instructions at home:  Schedule regular health, dental, and eye exams.  Stay current with your immunizations.  Do not use any tobacco products including cigarettes, chewing tobacco, or electronic cigarettes.  If you are pregnant, do not drink alcohol.  If you are breastfeeding, limit how much and how often you drink alcohol.  Limit alcohol intake to no more than 1 drink per day for nonpregnant women. One drink equals 12 ounces of beer, 5 ounces of wine, or 1 ounces of hard liquor.  Do not use street drugs.  Do not share needles.  Ask your health care provider for help if you need support or information about quitting drugs.  Tell your health care provider if you often feel depressed.  Tell your health care provider if you have ever been abused or do not feel safe at home. This information is not intended to replace advice given to you by your health care provider. Make sure you discuss any questions you have with your health care provider. Document Released: 10/08/2010 Document Revised: 08/31/2015 Document Reviewed: 12/27/2014 [employer] Interactive Patient Education  2019 [employer].

## 2018-06-24 ENCOUNTER — Encounter: Payer: Commercial Managed Care - PPO | Admitting: Internal Medicine

## 2018-07-20 ENCOUNTER — Other Ambulatory Visit: Payer: Self-pay | Admitting: Internal Medicine

## 2018-07-21 ENCOUNTER — Encounter: Payer: Commercial Managed Care - PPO | Admitting: Internal Medicine

## 2018-07-30 ENCOUNTER — Encounter: Payer: Commercial Managed Care - PPO | Admitting: Internal Medicine

## 2018-09-16 ENCOUNTER — Encounter: Payer: Self-pay | Admitting: Psychiatry

## 2018-09-16 ENCOUNTER — Other Ambulatory Visit: Payer: Self-pay

## 2018-09-16 ENCOUNTER — Ambulatory Visit: Payer: Commercial Managed Care - PPO | Admitting: Psychiatry

## 2018-09-16 DIAGNOSIS — G250 Essential tremor: Secondary | ICD-10-CM | POA: Diagnosis not present

## 2018-09-16 DIAGNOSIS — F3341 Major depressive disorder, recurrent, in partial remission: Secondary | ICD-10-CM

## 2018-09-16 DIAGNOSIS — G3184 Mild cognitive impairment, so stated: Secondary | ICD-10-CM | POA: Diagnosis not present

## 2018-09-16 DIAGNOSIS — F411 Generalized anxiety disorder: Secondary | ICD-10-CM | POA: Diagnosis not present

## 2018-09-16 NOTE — Progress Notes (Signed)
[name] [id number] [birth year] 78 y.o.  Subjective:   Patient ID:  [name] is a 78 y.o. (DOB 1940-11-22) female.  Chief Complaint:  Chief Complaint  Patient presents with  . Follow-up    Medication Management  . Stress    HPI [name] presents to the office today for follow-up of mood, anxiety and cognitive problems, and marital stress.  Last seen December 2019.  Meds were changed.  Been physically healthy.  She's been good emotionally all things considered.  Patient reports stable mood and denies depressed or irritable moods.  Patient denies any recent difficulty with anxiety.  Patient denies difficulty with sleep initiation or maintenance. Denies appetite disturbance.  Patient reports that energy is lower bc of demands with husband and motivation have been good.  Patient denies any difficulty with concentration.  Patient denies any suicidal ideation.  Stress at home is the same.  Regrets not divorcing him when she should have and it's too late now.  He's self -centered and constantly critical and demanding. H chronically complains and blames her for his problems.  He has a bunch of health problems and is very irritable.  New dx colon cancer and anemia and hyperthyroidism.  No current cancer tx indicated. Overall handling the stress pretty well.  She's satisfied with the meds.  Walking 3-5 miles most days.  Helps a lot for several weeks.  Part of self defense program.  She increased doenpezil and tolerated it this time.  Memory seems OK but not what it should be.    Past Psychiatric Medication Trials:  Wellbutrin XL, primidone, Seroquel, propranolol, Fetzima, clonidine, olanzapine, Ativan, Cerfolin-NAC, atenolol, gabapentin, Adderall, Topamax, Thorazine, Depakote for headaches, Tranxene, trazodone, Lexapro, sertraline, Abilify, Dalmane, Ritalin, duloxetine, Lunesta  Review of Systems:  Review of Systems  Neurological: Positive for tremors. Negative for weakness.   Psychiatric/Behavioral: Negative for agitation, behavioral problems, confusion, decreased concentration, dysphoric mood, hallucinations, self-injury, sleep disturbance and suicidal ideas. The patient is not nervous/anxious and is not hyperactive.     Medications: I have reviewed the patient's current medications.  Current Outpatient Medications  Medication Sig Dispense Refill  . aspirin EC 81 MG tablet Take 1 tablet (81 mg total) by mouth every other day.    Marland Kitchen BIOTIN PO Take by mouth.    . Calcium Carbonate (CALTRATE 600 PO) Take 2 tablets by mouth 2 (two) times daily.     . Cholecalciferol (VITAMIN D3 PO) Take 1 tablet by mouth daily.    . clobetasol cream (TEMOVATE) 5.09 % Apply 1 application topically once a week.     . cloNIDine (CATAPRES) 0.1 MG tablet TAKE 1 TABLET IN THE MORNING. 30 tablet PRN  . Coenzyme Q10 (COQ10) 100 MG CAPS Take 1 capsule by mouth daily.     Marland Kitchen donepezil (ARICEPT) 10 MG tablet Take 20 mg by mouth at bedtime.     Marland Kitchen esomeprazole (NEXIUM) 40 MG capsule TAKE 1 CAPSULE IN THE MORNING BEFORE BREAKFAST. 30 capsule 3  . Ginkgo Biloba 40 MG TABS Take by mouth.    . loratadine (CLARITIN) 10 MG tablet Take 10 mg by mouth daily.    . metronidazole (NORITATE) 1 % cream Apply 1 application topically 2 (two) times daily.    . Multiple Vitamins-Minerals (CENTRUM SILVER PO) Take by mouth.    . naproxen sodium (RA NAPROXEN SODIUM) 220 MG tablet Take 220 mg by mouth 2 (two) times daily with a meal.     . oxymetazoline (AFRIN) 0.05 %  nasal spray Place 1 spray into both nostrils as needed for congestion.    . Potassium (POTASSIMIN PO) Take by mouth daily.    . Psyllium (METAMUCIL PO) Take by mouth. 4 by mouth daily    . QUEtiapine Fumarate (SEROQUEL XR) 150 MG 24 hr tablet TAKE 1 TABLET ONCE DAILY IN THE EVENING. 30 tablet 5  . rosuvastatin (CRESTOR) 20 MG tablet Take 1 tablet (20 mg total) by mouth at bedtime. 30 tablet 11  . Simethicone (GAS-X PO) Take by mouth.    . Tafluprost  (ZIOPTAN OP) Apply to eye. Left eye only for increased pressure    . vitamin C (ASCORBIC ACID) 500 MG tablet Take 500 mg by mouth 2 (two) times daily.    . Vitamin E (E200 PO) Take by mouth.     No current facility-administered medications for this visit.     Medication Side Effects: None  Allergies:  Allergies  Allergen Reactions  . Amphetamine-Dextroamphet Er Other (See Comments)    rash  . Hydromorphone Shortness Of Breath    BREATHING AND CARDIAC ISSUES  . Metronidazole Other (See Comments)    Unknown REACTION: SORES IN MOUTH, severe diarrhea  . Penicillins     RASH  . Thorazine [Chlorpromazine] Other (See Comments)    " had a total hepatic wipeout" in 1974  . Bupivacaine Other (See Comments)    Unknown  . Atorvastatin Other (See Comments)    Upset stomach  . Azithromycin     Unknown reaction  . Cephalexin     Unknown reaction  . Chlorpromazine Hcl     Unknown reaction  . Ciprofloxacin     REACTION: SEVERE PAIN,DEHYDRATION,DIARRHEA  . Doxycycline Diarrhea  . Ezetimibe     REACTION: JOINT/MUSCLE ACHES  . Hyoscyamine Sulfate     Unknown reaction  . Lactose Intolerance (Gi) Diarrhea  . Lidocaine Swelling  . Oatmeal Other (See Comments)     Sinus Congestion   . Sulfamethoxazole     REACTION: GI upset-acid reflux  . Telithromycin     Unknown reaction  . Topiramate     Unknown reaction  . Latex Rash    Past Medical History:  Diagnosis Date  . Abdominal bruit   . Abnormal weight gain   . Allergic rhinitis, cause unspecified   . CAD (coronary artery disease)    diaphragmatic wall infarction in 1999, treated with balloon angioplasty with nonobstructive disease at ast catheterization in 2007 as described above  . Colon polyp 12/20/2011   Tubular adenoma  . Depression   . Diverticular disease    acute diverticulitis 10/13/11  . Fibromyalgia   . GERD (gastroesophageal reflux disease)   . Hyperlipemia    with low HDL.  . IBS (irritable bowel syndrome)   .  Migraine, unspecified, without mention of intractable migraine without mention of status migrainosus   . Tremor   . Unspecified adverse effect of unspecified drug, medicinal and biological substance   . Ventral incisional hernia 06/04/2015    Family History  Problem Relation Age of Onset  . Heart attack Father 67  . Ulcers Father   . Lung cancer Father   . Alcohol abuse Mother   . Heart attack Brother 75       1/2 BROTHER  . Bipolar disorder Sister   . Heart attack Sister 25  . Lymphoma Sister   . Stroke Sister        1/2 SISTER  . Other Brother  brain tumor  . Mental illness Son   . Colon cancer Neg Hx     Social History   Socioeconomic History  . Marital status: Married    Spouse name: Not on file  . Number of children: Not on file  . Years of education: Not on file  . Highest education level: Not on file  Occupational History  . Occupation: Retired Tour manager  . Financial resource strain: Not on file  . Food insecurity:    Worry: Not on file    Inability: Not on file  . Transportation needs:    Medical: Not on file    Non-medical: Not on file  Tobacco Use  . Smoking status: Never Smoker  . Smokeless tobacco: Never Used  . Tobacco comment: second hand smoke for decades  Substance and Sexual Activity  . Alcohol use: No  . Drug use: No  . Sexual activity: Not on file  Lifestyle  . Physical activity:    Days per week: Not on file    Minutes per session: Not on file  . Stress: Not on file  Relationships  . Social connections:    Talks on phone: Not on file    Gets together: Not on file    Attends religious service: Not on file    Active member of club or organization: Not on file    Attends meetings of clubs or organizations: Not on file    Relationship status: Not on file  . Intimate partner violence:    Fear of current or ex partner: Not on file    Emotionally abused: Not on file    Physically abused: Not on file    Forced sexual  activity: Not on file  Other Topics Concern  . Not on file  Social History Narrative   High fiber diet          Past Medical History, Surgical history, Social history, and Family history were reviewed and updated as appropriate.   Please see review of systems for further details on the patient's review from today.   Objective:   Physical Exam:  There were no vitals taken for this visit.  Physical Exam Constitutional:      General: She is not in acute distress.    Appearance: She is well-developed.  Musculoskeletal:        General: No deformity.  Neurological:     Mental Status: She is alert and oriented to person, place, and time.     Motor: Tremor present.     Coordination: Coordination normal.     Gait: Gait normal.     Comments: Mild tremor  Psychiatric:        Attention and Perception: She is attentive.        Mood and Affect: Mood is not anxious or depressed. Affect is not labile, blunt, angry or inappropriate.        Speech: Speech normal.        Behavior: Behavior normal.        Thought Content: Thought content normal. Thought content does not include homicidal or suicidal ideation. Thought content does not include homicidal or suicidal plan.        Judgment: Judgment normal.     Comments: Insight intact. No auditory or visual hallucinations. Overall her cognition is better than it used to be since off the BZ.  She is chronically stressed by her husband but not unusually anxious nor depressed     Lab Review:  Component Value Date/Time   NA 140 05/12/2018 1100   K 4.1 05/12/2018 1100   CL 103 05/12/2018 1100   CO2 26 05/12/2018 1100   GLUCOSE 105 (H) 05/12/2018 1100   BUN 11 05/12/2018 1100   CREATININE 0.78 05/12/2018 1100   CALCIUM 9.2 05/12/2018 1100   PROT 6.6 05/12/2018 1100   ALBUMIN 3.7 05/12/2018 1100   AST 30 05/12/2018 1100   ALT 14 05/12/2018 1100   ALKPHOS 51 05/12/2018 1100   BILITOT 0.4 05/12/2018 1100   GFRNONAA >60 05/12/2018 1100    GFRAA >60 05/12/2018 1100       Component Value Date/Time   WBC 5.6 05/12/2018 1100   RBC 3.75 (L) 05/12/2018 1100   HGB 12.8 05/12/2018 1100   HCT 38.7 05/12/2018 1100   PLT 199 05/12/2018 1100   MCV 103.2 (H) 05/12/2018 1100   MCH 34.1 (H) 05/12/2018 1100   MCHC 33.1 05/12/2018 1100   RDW 11.8 05/12/2018 1100   LYMPHSABS 1.5 06/16/2017 0944   MONOABS 0.7 06/16/2017 0944   EOSABS 0.2 06/16/2017 0944   BASOSABS 0.0 06/16/2017 0944    No results found for: POCLITH, LITHIUM   No results found for: PHENYTOIN, PHENOBARB, VALPROATE, CBMZ   .res Assessment: Plan:    Generalized anxiety disorder  Mild cognitive impairment  Recurrent major depression in partial remission (HCC)  Benign essential tremor   The patient has a long psychiatric history with multiple failed medications in the past.  She is doing relatively well with Seroquel as the primary mood medication.  She is tolerating it well.  Is generally calm controlling depression and anxiety and insomnia.    Disc polypharmacy necessary and goals to maintain cognition.  Cognition appears stable and she is tolerating Aricept.  She's been able to tolerate the increase in Aricept this time to maximize the effect.  She's been successful coming off clonazepam.  Supportive therapy and problem solving and boundary discussion on dealing with stress from her husband.  Work on self-care.  No indication for med changes at this time.  Satisfied with the meds.  Seroquel is helping mood and sleep and anxiety which are all improved compared to prior to the medications.  Good response to meds.  Discussed potential metabolic side effects associated with atypical antipsychotics, as well as potential risk for movement side effects. Advised pt to contact office if movement side effects occur.   This was a 36-minute appointment  FU 6 mos.  Lynder Parents, MD, DFAPA   Please see After Visit Summary for patient specific  instructions.  Future Appointments  Date Time Provider [place]  10/06/2018  8:00 AM Binnie Rail, MD [facility]  03/17/2019 11:30 AM Cottle, Billey Co., MD [facility] None    No orders of the defined types were placed in this encounter.     -------------------------------

## 2018-09-23 LAB — HM MAMMOGRAPHY

## 2018-09-24 ENCOUNTER — Encounter: Payer: Self-pay | Admitting: Internal Medicine

## 2018-10-06 ENCOUNTER — Encounter: Payer: Commercial Managed Care - PPO | Admitting: Internal Medicine

## 2018-10-22 NOTE — Progress Notes (Signed)
Subjective:    Patient ID: [name], female    DOB: 01-05-1941, 78 y.o.   MRN: [id number]  HPI She is here for a physical exam.    She denies changes in her health since she was here last.   She had her upper eye lids lifted within the past month ( 6/23).  She has a small spot near her right eyebrow and thinks it is fluid from the surgery.  She just wanted me to look at it.    She has no other concerns.    Medications and allergies reviewed with patient and updated if appropriate.  Patient Active Problem List   Diagnosis Date Noted  . Chest tightness 08/14/2017  . Prediabetes 06/10/2016  . Anesthesia complication 97/05/6376  . Abnormal CT scan, lung 08/11/15 08/19/2015  . 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 S/P herniorrhaphy 08/01/2015  . Corneal abrasion, left 06/08/2015  . Osteoporosis 05/17/2012  . Hypertension 02/28/2012  . 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 Diverticulitis 10/24/2011  . Pulmonic insufficiency 08/27/2010  . TREMOR, ESSENTIAL 06/20/2010  . CARDIOMEGALY, MILD 02/01/2010  . UNSPECIFIED VITAMIN D DEFICIENCY 01/16/2010  . LIPOMAS, MULTIPLE 09/26/2008  . Anxiety state 07/14/2008  . Dyslipidemia 07/11/2008  . FIBROCYSTIC BREAST DISEASE 06/08/2008  . Irritable bowel syndrome 08/27/2007  . GERD 08/26/2007  . CAD S/P percutaneous coronary angioplasty 07/10/2007  . Migraine, unspecified, without mention of intractable migraine without mention of status migrainosus 03/12/2007  . ACUT MI ANTEROLAT WALL SUBSQT EPIS CARE 03/12/2007  . ALLERGIC RHINITIS 03/12/2007  . Diverticulosis of large intestine 11/25/2006    Current Outpatient Medications on File Prior to Visit  Medication Sig Dispense Refill  . aspirin EC 81 MG tablet Take 1 tablet (81 mg total) by mouth every other day.    Marland Kitchen BIOTIN PO Take by mouth.    . Calcium Carbonate (CALTRATE 600 PO) Take 2 tablets by mouth 2 (two) times daily.     . Cholecalciferol  (VITAMIN D3 PO) Take 1 tablet by mouth daily.    . clobetasol cream (TEMOVATE) 5.88 % Apply 1 application topically once a week.     . cloNIDine (CATAPRES) 0.1 MG tablet TAKE 1 TABLET IN THE MORNING. 30 tablet PRN  . Coenzyme Q10 (COQ10) 100 MG CAPS Take 1 capsule by mouth daily.     Marland Kitchen donepezil (ARICEPT) 10 MG tablet Take 20 mg by mouth at bedtime.     Marland Kitchen esomeprazole (NEXIUM) 40 MG capsule TAKE 1 CAPSULE IN THE MORNING BEFORE BREAKFAST. 30 capsule 3  . Ginkgo Biloba 40 MG TABS Take by mouth.    . loratadine (CLARITIN) 10 MG tablet Take 10 mg by mouth daily.    . metronidazole (NORITATE) 1 % cream Apply 1 application topically 2 (two) times daily.    . Multiple Vitamins-Minerals (CENTRUM SILVER PO) Take by mouth.    . naproxen sodium (RA NAPROXEN SODIUM) 220 MG tablet Take 220 mg by mouth 2 (two) times daily with a meal.     . oxymetazoline (AFRIN) 0.05 % nasal spray Place 1 spray into both nostrils as needed for congestion.    . Potassium (POTASSIMIN PO) Take by mouth daily.    . Psyllium (METAMUCIL PO) Take by mouth. 4 by mouth daily    . QUEtiapine Fumarate (SEROQUEL XR) 150 MG 24 hr tablet TAKE 1 TABLET ONCE DAILY IN THE EVENING. 30 tablet 5  . rosuvastatin (CRESTOR) 20 MG tablet Take 1 tablet (20  mg total) by mouth at bedtime. 30 tablet 11  . Simethicone (GAS-X PO) Take by mouth.    . Tafluprost (ZIOPTAN OP) Apply to eye. Left eye only for increased pressure     No current facility-administered medications on file prior to visit.     Past Medical History:  Diagnosis Date  . Abdominal bruit   . Abnormal weight gain   . Allergic rhinitis, cause unspecified   . CAD (coronary artery disease)    diaphragmatic wall infarction in 1999, treated with balloon angioplasty with nonobstructive disease at ast catheterization in 2007 as described above  . Colon polyp 12/20/2011   Tubular adenoma  . Depression   . Diverticular disease    acute diverticulitis 10/13/11  . Fibromyalgia   . GERD  (gastroesophageal reflux disease)   . Hyperlipemia    with low HDL.  . IBS (irritable bowel syndrome)   . Migraine, unspecified, without mention of intractable migraine without mention of status migrainosus   . Tremor   . Unspecified adverse effect of unspecified drug, medicinal and biological substance   . Ventral incisional hernia 06/04/2015    Past Surgical History:  Procedure Laterality Date  . ABDOMINAL HYSTERECTOMY  1979   partial  . ABDOMINAL SURGERY      @ [facility]  in [place] in 2002  for ptosis of organs; mersilene mesh sling, cystocopy  by Dr  Rosana Hoes, Urologist  . BLADDER SURGERY  patient unsure of date   sling  . CATARACT EXTRACTION     left eye  . COLONOSCOPY  2011   Diverticulosis & hemorrhoids; Dr Olevia Perches  . LAPAROSCOPIC SIGMOID COLECTOMY  02/26/2012   Procedure: LAPAROSCOPIC SIGMOID COLECTOMY;  Surgeon: Adin Hector, MD;  Location: [facility];  Service: General;  Laterality: N/A;  Laparoscopic Assisted Sigmoid Colectomy  . PARTIAL COLECTOMY  02/26/2012   Procedure: PARTIAL COLECTOMY;  Surgeon: Adin Hector, MD;  Location: [facility];  Service: General;  Laterality: N/A;  Sigmoid Colectomy  . PARTIAL HYSTERECTOMY    . TONSILLECTOMY  as child  . [place]    Social History   Socioeconomic History  . Marital status: Married    Spouse name: Not on file  . Number of children: Not on file  . Years of education: Not on file  . Highest education level: Not on file  Occupational History  . Occupation: Retired Tour manager  . Financial resource strain: Not on file  . Food insecurity    Worry: Not on file    Inability: Not on file  . Transportation needs    Medical: Not on file    Non-medical: Not on file  Tobacco Use  . Smoking status: Never Smoker  . Smokeless tobacco: Never Used  . Tobacco comment: second hand smoke for decades  Substance and Sexual Activity  . Alcohol use: No  . Drug use: No  . Sexual activity: Not on file  Lifestyle   . Physical activity    Days per week: Not on file    Minutes per session: Not on file  . Stress: Not on file  Relationships  . Social Herbalist on phone: Not on file    Gets together: Not on file    Attends religious service: Not on file    Active member of club or organization: Not on file    Attends meetings of clubs or organizations: Not on file    Relationship status: Not  on file  Other Topics Concern  . Not on file  Social History Narrative   High fiber diet          Family History  Problem Relation Age of Onset  . Heart attack Father 75  . Ulcers Father   . Lung cancer Father   . Alcohol abuse Mother   . Heart attack Brother 50       1/2 BROTHER  . Bipolar disorder Sister   . Heart attack Sister 67  . Lymphoma Sister   . Stroke Sister        1/2 SISTER  . Other Brother        brain tumor  . Mental illness Son   . Colon cancer Neg Hx     Review of Systems  Constitutional: Negative for chills and fever.  Eyes: Negative for visual disturbance.  Respiratory: Negative for cough, shortness of breath and wheezing.   Cardiovascular: Negative for chest pain, palpitations and leg swelling.  Gastrointestinal: Negative for abdominal pain, blood in stool, constipation, diarrhea and nausea.       Gerd controlled  Genitourinary: Negative for dysuria and hematuria.  Musculoskeletal: Negative for arthralgias and back pain.  Skin: Negative for color change and rash.  Neurological: Negative for dizziness, light-headedness, numbness and headaches.  Psychiatric/Behavioral: Positive for dysphoric mood (controlled). The patient is nervous/anxious (controlled).        Objective:   Vitals:   10/23/18 0809  BP: 140/68  Pulse: 67  Temp: 98.3 F (36.8 C)  SpO2: 96%   Filed Weights   10/23/18 0809  Weight: 124 lb 4 oz (56.4 kg)   Body mass index is 23.48 kg/m.  BP Readings from Last 3 Encounters:  10/23/18 140/68  05/12/18 120/78  08/14/17 (!) 142/76     Wt Readings from Last 3 Encounters:  10/23/18 124 lb 4 oz (56.4 kg)  05/12/18 131 lb 9.8 oz (59.7 kg)  08/14/17 132 lb (59.9 kg)     Physical Exam Constitutional: She appears well-developed and well-nourished. No distress.  HENT:  Head: Normocephalic and atraumatic.  Right Ear: External ear normal. Normal ear canal and TM Left Ear: External ear normal.  Normal ear canal and TM Mouth/Throat: Oropharynx is clear and moist.  Eyes: Conjunctivae and EOM are normal.  Neck: Neck supple. No tracheal deviation present. No thyromegaly present.  No carotid bruit  Cardiovascular: Normal rate, regular rhythm and normal heart sounds.   No murmur heard.  No edema. Pulmonary/Chest: Effort normal and breath sounds normal. No respiratory distress. She has no wheezes. She has no rales.  Breast: deferred to Gyn Abdominal: Soft. She exhibits no distension. There is no tenderness.  Lymphadenopathy: She has no cervical adenopathy.  Skin: Skin is warm and dry. She is not diaphoretic. Small fluid filled bump along scar on upper right eyelid - soft, nontender, non-erythematous Psychiatric: She has a normal mood and affect. Her behavior is normal.        Assessment & Plan:   Physical exam: Screening blood work   Reviewed from earlier this year - no additional blood work needed Immunizations    Prevnar deferred, discussed Shingrix Colonoscopy    up-to-date Mammogram   last done 09/2018 Gyn     up-to-date Dexa   up-to-date Eye exams    up-to-date EKG    05/2018 Exercise    regular-walking most days, weights Weight     normal BMI Skin    no concerns - sees derm.  Has  f/u with eye surgery for fluid filled bump - but this will likely resolve on its own Substance abuse    None   See Problem List for Assessment and Plan of chronic medical problems.   FU in one year

## 2018-10-22 NOTE — Patient Instructions (Addendum)
All other Health Maintenance issues reviewed.   All recommended immunizations and age-appropriate screenings are up-to-date or discussed.  No immunization administered today.   Medications reviewed and updated.  Changes include :   none    Please followup in 1 year   Health Maintenance, Female Adopting a healthy lifestyle and getting preventive care are important in promoting health and wellness. Ask your health care provider about:  The right schedule for you to have regular tests and exams.  Things you can do on your own to prevent diseases and keep yourself healthy. What should I know about diet, weight, and exercise? Eat a healthy diet   Eat a diet that includes plenty of vegetables, fruits, low-fat dairy products, and lean protein.  Do not eat a lot of foods that are high in solid fats, added sugars, or sodium. Maintain a healthy weight Body mass index (BMI) is used to identify weight problems. It estimates body fat based on height and weight. Your health care provider can help determine your BMI and help you achieve or maintain a healthy weight. Get regular exercise Get regular exercise. This is one of the most important things you can do for your health. Most adults should:  Exercise for at least 150 minutes each week. The exercise should increase your heart rate and make you sweat (moderate-intensity exercise).  Do strengthening exercises at least twice a week. This is in addition to the moderate-intensity exercise.  Spend less time sitting. Even light physical activity can be beneficial. Watch cholesterol and blood lipids Have your blood tested for lipids and cholesterol at 78 years of age, then have this test every 5 years. Have your cholesterol levels checked more often if:  Your lipid or cholesterol levels are high.  You are older than 78 years of age.  You are at high risk for heart disease. What should I know about cancer screening? Depending on your health  history and family history, you may need to have cancer screening at various ages. This may include screening for:  Breast cancer.  Cervical cancer.  Colorectal cancer.  Skin cancer.  Lung cancer. What should I know about heart disease, diabetes, and high blood pressure? Blood pressure and heart disease  High blood pressure causes heart disease and increases the risk of stroke. This is more likely to develop in people who have high blood pressure readings, are of African descent, or are overweight.  Have your blood pressure checked: ? Every 3-5 years if you are 18-39 years of age. ? Every year if you are 40 years old or older. Diabetes Have regular diabetes screenings. This checks your fasting blood sugar level. Have the screening done:  Once every three years after age 40 if you are at a normal weight and have a low risk for diabetes.  More often and at a younger age if you are overweight or have a high risk for diabetes. What should I know about preventing infection? Hepatitis B If you have a higher risk for hepatitis B, you should be screened for this virus. Talk with your health care provider to find out if you are at risk for hepatitis B infection. Hepatitis C Testing is recommended for:  Everyone born from 1945 through 1965.  Anyone with known risk factors for hepatitis C. Sexually transmitted infections (STIs)  Get screened for STIs, including gonorrhea and chlamydia, if: ? You are sexually active and are younger than 78 years of age. ? You are older than 78 years   of age and your health care provider tells you that you are at risk for this type of infection. ? Your sexual activity has changed since you were last screened, and you are at increased risk for chlamydia or gonorrhea. Ask your health care provider if you are at risk.  Ask your health care provider about whether you are at high risk for HIV. Your health care provider may recommend a prescription medicine to  help prevent HIV infection. If you choose to take medicine to prevent HIV, you should first get tested for HIV. You should then be tested every 3 months for as long as you are taking the medicine. Pregnancy  If you are about to stop having your period (premenopausal) and you may become pregnant, seek counseling before you get pregnant.  Take 400 to 800 micrograms (mcg) of folic acid every day if you become pregnant.  Ask for birth control (contraception) if you want to prevent pregnancy. Osteoporosis and menopause Osteoporosis is a disease in which the bones lose minerals and strength with aging. This can result in bone fractures. If you are 58 years old or older, or if you are at risk for osteoporosis and fractures, ask your health care provider if you should:  Be screened for bone loss.  Take a calcium or vitamin D supplement to lower your risk of fractures.  Be given hormone replacement therapy (HRT) to treat symptoms of menopause. Follow these instructions at home: Lifestyle  Do not use any products that contain nicotine or tobacco, such as cigarettes, e-cigarettes, and chewing tobacco. If you need help quitting, ask your health care provider.  Do not use street drugs.  Do not share needles.  Ask your health care provider for help if you need support or information about quitting drugs. Alcohol use  Do not drink alcohol if: ? Your health care provider tells you not to drink. ? You are pregnant, may be pregnant, or are planning to become pregnant.  If you drink alcohol: ? Limit how much you use to 0-1 drink a day. ? Limit intake if you are breastfeeding.  Be aware of how much alcohol is in your drink. In the [place] one drink equals one 12 oz bottle of beer (355 mL), one 5 oz glass of wine (148 mL), or one 1 oz glass of hard liquor (44 mL). General instructions  Schedule regular health, dental, and eye exams.  Stay current with your vaccines.  Tell your health care  provider if: ? You often feel depressed. ? You have ever been abused or do not feel safe at home. Summary  Adopting a healthy lifestyle and getting preventive care are important in promoting health and wellness.  Follow your health care provider's instructions about healthy diet, exercising, and getting tested or screened for diseases.  Follow your health care provider's instructions on monitoring your cholesterol and blood pressure. This information is not intended to replace advice given to you by your health care provider. Make sure you discuss any questions you have with your health care provider. Document Released: 10/08/2010 Document Revised: 03/18/2018 Document Reviewed: 03/18/2018 Elsevier Patient Education  2020 [employer].

## 2018-10-23 ENCOUNTER — Encounter: Payer: Self-pay | Admitting: Internal Medicine

## 2018-10-23 ENCOUNTER — Ambulatory Visit (INDEPENDENT_AMBULATORY_CARE_PROVIDER_SITE_OTHER): Payer: Commercial Managed Care - PPO | Admitting: Internal Medicine

## 2018-10-23 ENCOUNTER — Other Ambulatory Visit: Payer: Self-pay

## 2018-10-23 VITALS — BP 140/68 | HR 67 | Temp 98.3°F | Ht 61.0 in | Wt 124.2 lb

## 2018-10-23 DIAGNOSIS — Z0000 Encounter for general adult medical examination without abnormal findings: Secondary | ICD-10-CM

## 2018-10-23 DIAGNOSIS — K219 Gastro-esophageal reflux disease without esophagitis: Secondary | ICD-10-CM

## 2018-10-23 DIAGNOSIS — I2510 Atherosclerotic heart disease of native coronary artery without angina pectoris: Secondary | ICD-10-CM | POA: Diagnosis not present

## 2018-10-23 DIAGNOSIS — E785 Hyperlipidemia, unspecified: Secondary | ICD-10-CM

## 2018-10-23 DIAGNOSIS — I10 Essential (primary) hypertension: Secondary | ICD-10-CM | POA: Diagnosis not present

## 2018-10-23 DIAGNOSIS — Z9861 Coronary angioplasty status: Secondary | ICD-10-CM

## 2018-10-23 DIAGNOSIS — F411 Generalized anxiety disorder: Secondary | ICD-10-CM

## 2018-10-23 DIAGNOSIS — M810 Age-related osteoporosis without current pathological fracture: Secondary | ICD-10-CM

## 2018-10-23 DIAGNOSIS — R7303 Prediabetes: Secondary | ICD-10-CM

## 2018-10-23 NOTE — Assessment & Plan Note (Addendum)
Low sugar / carb diet Stressed regular exercise    

## 2018-10-23 NOTE — Assessment & Plan Note (Addendum)
No concerning symptoms Following with cardio Reviewed blood work from 05/2018

## 2018-10-23 NOTE — Assessment & Plan Note (Addendum)
BP well controlled Current regimen effective and well tolerated Continue current medications at current doses  

## 2018-10-23 NOTE — Assessment & Plan Note (Signed)
GERD controlled Continue daily medication  

## 2018-10-23 NOTE — Assessment & Plan Note (Addendum)
Lipids controlled Continue daily statin Regular exercise and healthy diet encouraged

## 2018-10-23 NOTE — Assessment & Plan Note (Signed)
Management per Psych - Dr Clovis Pu

## 2018-10-23 NOTE — Assessment & Plan Note (Addendum)
dexa up to date Was on fosamax x 5 years Exercising -  Stressed UE resistance training, which she is doing Continue calcium and vitamin d

## 2018-10-25 ENCOUNTER — Other Ambulatory Visit: Payer: Self-pay | Admitting: Psychiatry

## 2018-11-08 ENCOUNTER — Other Ambulatory Visit: Payer: Self-pay | Admitting: Internal Medicine

## 2018-12-02 ENCOUNTER — Encounter: Payer: Self-pay | Admitting: Internal Medicine

## 2018-12-15 ENCOUNTER — Other Ambulatory Visit: Payer: Self-pay | Admitting: Internal Medicine

## 2019-02-15 ENCOUNTER — Other Ambulatory Visit: Payer: Self-pay | Admitting: Internal Medicine

## 2019-03-14 ENCOUNTER — Other Ambulatory Visit: Payer: Self-pay | Admitting: Internal Medicine

## 2019-03-17 ENCOUNTER — Encounter: Payer: Self-pay | Admitting: Psychiatry

## 2019-03-17 ENCOUNTER — Other Ambulatory Visit: Payer: Self-pay

## 2019-03-17 ENCOUNTER — Ambulatory Visit (INDEPENDENT_AMBULATORY_CARE_PROVIDER_SITE_OTHER): Payer: Medicare Other | Admitting: Psychiatry

## 2019-03-17 DIAGNOSIS — G250 Essential tremor: Secondary | ICD-10-CM | POA: Diagnosis not present

## 2019-03-17 DIAGNOSIS — F3341 Major depressive disorder, recurrent, in partial remission: Secondary | ICD-10-CM | POA: Diagnosis not present

## 2019-03-17 DIAGNOSIS — F411 Generalized anxiety disorder: Secondary | ICD-10-CM | POA: Diagnosis not present

## 2019-03-17 MED ORDER — QUETIAPINE FUMARATE ER 150 MG PO TB24: 150.0000 mg | ORAL_TABLET | Freq: Every day | ORAL | 5 refills | Status: AC

## 2019-03-17 NOTE — Progress Notes (Signed)
[name] [id number] [birth year] 78 y.o.  Subjective:   Patient ID:  [name] is a 78 y.o. (DOB [birth year]) female.  Chief Complaint:  Chief Complaint  Patient presents with  . Follow-up    Medication Management  . Anxiety    Medication Management  . Stress    HPI [name] presents to the office today for follow-up of mood, anxiety and cognitive problems, and marital stress.  Last seen September 16, 2018.  No meds were changed.  Been physically healthy.  She's been good emotionally all things considered.  Overall doing OK but diffficult living with depressed and forgetful husband.  Patient reports stable mood and denies depressed or irritable moods.  Patient denies any recent difficulty with anxiety.  Patient denies difficulty with sleep initiation or maintenance. Denies appetite disturbance.  Patient reports that energy is lower bc of demands with husband and motivation have been good.  Patient denies any difficulty with concentration.  Patient denies any suicidal ideation.  Stress at home is the same.  Regrets not divorcing him when she should have and it's too late now.  He's self -centered and constantly critical and demanding.  She stays very busy. H chronically complains and blames her for his problems.  He has a bunch of health problems and is very irritable.  New dx colon cancer and anemia and hyperthyroidism.  No current cancer tx indicated. Overall handling the stress pretty well.  She's satisfied with the meds.  Walking 3-5 miles most days.  Helps a lot for several weeks.  Part of self defense program.  She increased doenpezil and tolerated it this time.  Memory seems OK but not what it should be.  H retired first of August.    Before cold she was walking 3-5 miles daily.  1 D lives 2 houses away. Oldest D coming before Xmas from [place]. Son will be there at [facility].     Past Psychiatric Medication Trials:  Wellbutrin XL, Fetzima, Lexapro,  Sertraline, duloxetine,  Abilify,  primidone,  Seroquel, propranolol,  clonidine, olanzapine,  Cerfolin-NAC, atenolol, gabapentin, Adderall, Topamax, Thorazine, Depakote for headaches,  Tranxene, trazodone,  D[place]man[place], Ritalin,  Lunesta, Ativan,  Review of Systems:  Review of Systems  Neurological: Positive for tremors. Negative for weakness.  Psychiatric/Behavioral: Negative for agitation, behavioral problems, confusion, decreased concentration, dysphoric mood, hallucinations, self-injury, sleep disturbance and suicidal ideas. The patient is not nervous/anxious and is not hyperactive.     Medications: I have reviewed the patient's current medications.  Current Outpatient Medications  Medication Sig Dispense Refill  . aspirin EC 81 MG tablet Take 1 tablet (81 mg total) by mouth every other day.    Marland Kitchen BIOTIN PO Take by mouth.    . Calcium Carbonate (CALTRATE 600 PO) Take 2 tablets by mouth 2 (two) times daily.     . Cholecalciferol (VITAMIN D3 PO) Take 1 tablet by mouth daily.    . clobetasol cream (TEMOVATE) [id number] % Apply 1 application topically once a week.     . cloNIDine (CATAPRES) 0.1 MG tablet TAKE 1 TABLET IN THE MORNING. 30 tablet PRN  . Coenzyme Q10 (COQ10) 100 MG CAPS Take 1 capsule by mouth daily.     Marland Kitchen donepezil (ARICEPT) 10 MG tablet TAKE 2 TABLETS DAILY. 60 tablet PRN  . esomeprazole (NEXIUM) 40 MG capsule TAKE 1 CAPSULE IN THE MORNING BEFORE BREAKFAST. 30 capsule 0  . Ginkgo Biloba 40 MG TABS Take by mouth.    . loratadine (CLARITIN)  10 MG tablet Take 10 mg by mouth daily.    . metronidazole (NORITATE) 1 % cream Apply 1 application topically 2 (two) times daily.    . Multiple Vitamins-Minerals (CENTRUM SILVER PO) Take by mouth.    . naproxen sodium (RA NAPROXEN SODIUM) 220 MG tablet Take 220 mg by mouth 2 (two) times daily with a meal.     . oxymetazoline (AFRIN) 0.05 % nasal spray Place 1 spray into both nostrils as needed for congestion.    . Potassium (POTASSIMIN PO) Take by mouth daily.    .  Psyllium (METAMUCIL PO) Take by mouth. 4 by mouth daily    . QUEtiapine Fumarate (SEROQUEL XR) 150 MG 24 hr tablet Take 1 tablet (150 mg total) by mouth at bedtime. 30 tablet 5  . rosuvastatin (CRESTOR) 20 MG tablet Take 1 tablet (20 mg total) by mouth at bedtime. 30 tablet 11  . Simethicone (GAS-X PO) Take by mouth.    . Tafluprost (ZIOPTAN OP) Apply to eye. Left eye only for increased pressure     No current facility-administered medications for this visit.     Medication Side Effects: None still  Allergies:  Allergies  Allergen Reactions  . Amphetamine-Dextroamphet Er Other (See Comments)    rash  . Hydromorphone Shortness Of Breath    BREATHING AND CARDIAC ISSUES  . Metronidazole Other (See Comments)    Unknown REACTION: SORES IN MOUTH, severe diarrhea  . Penicillins     RASH  . Thorazine [Chlorpromazine] Other (See Comments)    " had a total hepatic wipeout" in 1974  . Bupivacaine Other (See Comments)    Unknown  . Fentanyl Other (See Comments)    Mental issues  . Other Other (See Comments)    perservatives- GI problems; swelling; blood infection  . Atorvastatin Other (See Comments)    Upset stomach  . Azithromycin     Unknown reaction  . Cephalexin     Unknown reaction  . Chlorpromazine Hcl     Unknown reaction  . Ciprofloxacin     REACTION: SEVERE PAIN,DEHYDRATION,DIARRHEA  . Doxycycline Diarrhea  . Ezetimibe     REACTION: JOINT/MUSCLE ACHES  . Hyoscyamine Sulfate     Unknown reaction  . Lactose Intolerance (Gi) Diarrhea  . Lidocaine Swelling  . Oatmeal Other (See Comments)     Sinus Congestion   . Sulfamethoxazole     REACTION: GI upset-acid reflux  . Telithromycin     Unknown reaction  . Topiramate     Unknown reaction  . Lactose Diarrhea  . Latex Rash    Past Medical History:  Diagnosis Date  . Abdominal bruit   . Abnormal weight gain   . Allergic rhinitis, cause unspecified   . CAD (coronary artery disease)    diaphragmatic wall infarction  in 1999, treated with balloon angioplasty with nonobstructive disease at ast catheterization in 2007 as described above  . Colon polyp 12/20/2011   Tubular adenoma  . Depression   . Diverticular disease    acute diverticulitis 10/13/11  . Fibromyalgia   . GERD (gastroesophageal reflux disease)   . Hyperlipemia    with low HDL.  . IBS (irritable bowel syndrome)   . Migraine, unspecified, without mention of intractable migraine without mention of status migrainosus   . Tremor   . Unspecified adverse effect of unspecified drug, medicinal and biological substance   . Ventral incisional hernia 06/04/2015    Family History  Problem Relation Age of Onset  . Heart  attack Father 79  . Ulcers Father   . Lung cancer Father   . Alcohol abuse Mother   . Heart attack Brother 66       1/2 BROTHER  . Bipolar disorder Sister   . Heart attack Sister 52  . Lymphoma Sister   . Stroke Sister        1/2 SISTER  . Other Brother        brain tumor  . Mental illness Son   . Colon cancer Neg Hx     Social History   Socioeconomic History  . Marital status: Married    Spouse name: Not on file  . Number of children: Not on file  . Years of education: Not on file  . Highest education level: Not on file  Occupational History  . Occupation: Retired Tour manager  . Financial resource strain: Not on file  . Food insecurity    Worry: Not on file    Inability: Not on file  . Transportation needs    Medical: Not on file    Non-medical: Not on file  Tobacco Use  . Smoking status: Never Smoker  . Smokeless tobacco: Never Used  . Tobacco comment: second hand smoke for decades  Substance and Sexual Activity  . Alcohol use: No  . Drug use: No  . Sexual activity: Not on file  Lifestyle  . Physical activity    Days per week: Not on file    Minutes per session: Not on file  . Stress: Not on file  Relationships  . Social Herbalist on phone: Not on file    Gets together: Not  on file    Attends religious service: Not on file    Active member of club or organization: Not on file    Attends meetings of clubs or organizations: Not on file    Relationship status: Not on file  . Intimate partner violence    Fear of current or ex partner: Not on file    Emotionally abused: Not on file    Physically abused: Not on file    Forced sexual activity: Not on file  Other Topics Concern  . Not on file  Social History Narrative   High fiber diet          Past Medical History, Surgical history, Social history, and Family history were reviewed and updated as appropriate.   Please see review of systems for further details on the patient's review from today.   Objective:   Physical Exam:  There were no vitals taken for this visit.  Physical Exam Constitutional:      General: She is not in acute distress.    Appearance: She is well-developed.  Musculoskeletal:        General: No deformity.  Neurological:     Mental Status: She is alert and oriented to person, place, and time.     Motor: Tremor present.     Coordination: Coordination normal.     Gait: Gait normal.     Comments: Mild tremor  Psychiatric:        Attention and Perception: She is attentive.        Mood and Affect: Mood is not anxious or depressed. Affect is not labile, blunt, angry or inappropriate.        Speech: Speech normal.        Behavior: Behavior normal.        Thought Content: Thought content  normal. Thought content does not include homicidal or suicidal ideation. Thought content does not include homicidal or suicidal plan.        Judgment: Judgment normal.     Comments: Insight intact. No auditory or visual hallucinations. Overall her cognition is better than it used to be since off the BZ.  She is chronically stressed by her husband but not unusually anxious nor depressed     Lab Review:     Component Value Date/Time   NA 140 05/12/2018 1100   K 4.1 05/12/2018 1100   CL 103  05/12/2018 1100   CO2 26 05/12/2018 1100   GLUCOSE 105 (H) 05/12/2018 1100   BUN 11 05/12/2018 1100   CREATININE 0.78 05/12/2018 1100   CALCIUM 9.2 05/12/2018 1100   PROT 6.6 05/12/2018 1100   ALBUMIN 3.7 05/12/2018 1100   AST 30 05/12/2018 1100   ALT 14 05/12/2018 1100   ALKPHOS 51 05/12/2018 1100   BILITOT 0.4 05/12/2018 1100   GFRNONAA >60 05/12/2018 1100   GFRAA >60 05/12/2018 1100       Component Value Date/Time   WBC 5.6 05/12/2018 1100   RBC 3.75 (L) 05/12/2018 1100   HGB 12.8 05/12/2018 1100   HCT 38.7 05/12/2018 1100   PLT 199 05/12/2018 1100   MCV 103.2 (H) 05/12/2018 1100   MCH 34.1 (H) 05/12/2018 1100   MCHC 33.1 05/12/2018 1100   RDW 11.8 05/12/2018 1100   LYMPHSABS 1.5 06/16/2017 0944   MONOABS 0.7 06/16/2017 0944   EOSABS 0.2 06/16/2017 0944   BASOSABS 0.0 06/16/2017 0944    No results found for: POCLITH, LITHIUM   No results found for: PHENYTOIN, PHENOBARB, VALPROATE, CBMZ   .res Assessment: Plan:    Generalized anxiety disorder - Plan: QUEtiapine Fumarate (SEROQUEL XR) 150 MG 24 hr tablet  Recurrent major depression in partial remission ([place]) - Plan: QUEtiapine Fumarate (SEROQUEL XR) 150 MG 24 hr tablet  Benign essential tremor   MCI appears to have largely resolved.    The patient has a long psychiatric history with multiple failed medications in the past.  She is doing relatively well with Seroquel as the primary mood medication.  She is tolerating it well.  Is generally calm controlling depression and anxiety and insomnia.    Disc polypharmacy necessary and goals to maintain cognition.  Cognition appears stable and she is tolerating Aricept.  She's been able to tolerate the increase in Aricept this time to maximize the effect.  She's been successful coming off clonazepam.  Supportive therapy and problem solving and boundary discussion on dealing with stress from her husband.  Work on self-care.  No indication for med changes at this time.   Satisfied with the meds.  Seroquel is helping mood and sleep and anxiety which are all improved compared to prior to the medications.  Good response to meds.  Discussed potential metabolic side effects associated with atypical antipsychotics, as well as potential risk for movement side effects. Advised pt to contact office if movement side effects occur.   This was a 30-minute appointment  FU 6 mos.  Janet Parents, MD, DFAPA   Please see After Visit Summary for patient specific instructions.  No future appointments.  No orders of the defined types were placed in this encounter.     -------------------------------

## 2019-04-11 ENCOUNTER — Other Ambulatory Visit: Payer: Self-pay | Admitting: Internal Medicine

## 2019-04-25 ENCOUNTER — Ambulatory Visit: Payer: Medicare Other | Attending: Internal Medicine

## 2019-04-25 DIAGNOSIS — Z23 Encounter for immunization: Secondary | ICD-10-CM | POA: Insufficient documentation

## 2019-04-25 NOTE — Progress Notes (Signed)
   Covid-19 Vaccination Clinic  Name:  [name]    MRN: [id number] DOB: [birth year]  04/25/2019  Ms. Bas was observed post Covid-19 immunization for 15 minutes without incidence. She was provided with Vaccine Information Sheet and instruction to access the V-Safe system.   Ms. Gene was instructed to call 911 with any severe reactions post vaccine: Marland Kitchen Difficulty breathing  . Swelling of your face and throat  . A fast heartbeat  . A bad rash all over your body  . Dizziness and weakness

## 2019-05-03 ENCOUNTER — Other Ambulatory Visit: Payer: Self-pay | Admitting: Psychiatry

## 2019-05-13 ENCOUNTER — Other Ambulatory Visit (HOSPITAL_COMMUNITY): Payer: Self-pay | Admitting: Cardiology

## 2019-05-13 ENCOUNTER — Other Ambulatory Visit: Payer: Self-pay | Admitting: Internal Medicine

## 2019-05-16 ENCOUNTER — Ambulatory Visit: Payer: Medicare Other | Attending: Internal Medicine

## 2019-05-16 DIAGNOSIS — Z23 Encounter for immunization: Secondary | ICD-10-CM

## 2019-05-16 NOTE — Progress Notes (Signed)
   Covid-19 Vaccination Clinic  Name:  DARLINE DIVITA    MRN: [id number] DOB: [birth year]  05/16/2019  Ms. Berardino was observed post Covid-19 immunization for 15 minutes without incidence. She was provided with Vaccine Information Sheet and instruction to access the V-Safe system.   Ms. Fix was instructed to call 911 with any severe reactions post vaccine: Marland Kitchen Difficulty breathing  . Swelling of your face and throat  . A fast heartbeat  . A bad rash all over your body  . Dizziness and weakness    Immunizations Administered    Name Date Dose VIS Date Route   Pfizer COVID-19 Vaccine 05/16/2019 11:23 AM 0.3 mL 03/19/2019 Intramuscular   Manufacturer: [place]   Lot: [id number]   [place]: [id number]

## 2019-06-13 ENCOUNTER — Other Ambulatory Visit: Payer: Self-pay | Admitting: Internal Medicine

## 2019-07-12 NOTE — Progress Notes (Signed)
Subjective:    Patient ID: [name], female    DOB: October 18, 1940, 79 y.o.   MRN: [id number]  HPI The patient is here for an acute visit.   SOB:  It started one week ago.  It comes and goes.  She was started on diamox and feels it was related to that, which it should not be per her eye doctor.  She started having the shortness of breath approximately 24-48 hours after starting the Diamox.  She has had reactions to all eyedrops she has been on-they tend to cause shortness of breath.  She has increased pressure in the high and has to be on this medication until next week when she will have surgery.  If she does not and the optic nerve could be damaged.  Shortness of breath has been worse with exertion, but she does feel it at rest.  Prior to starting this medication she was exercising regularly-walking 3 miles without any difficulty.  She has not had any chest pain or palpitations.  She has noted some fatigue at times and some "memory glitches at times".  She has noticed a little lightheadedness when she is very short of breath.  She thinks it is the Diamox.  She is supposed to be taking it 4 times a day, but some days has do not done that.  The days that she has do not taking it as much as she is supposed to the shortness of breath is better.   Medications and allergies reviewed with patient and updated if appropriate.  Patient Active Problem List   Diagnosis Date Noted  . Chest tightness 08/14/2017  . Prediabetes 06/10/2016  . Anesthesia complication [id number]  . Abnormal CT scan, lung 08/11/15 08/19/2015  . SIADH (syndrome of inappropriate ADH production) ([place]) 08/19/2015  . S/P herniorrhaphy 08/01/2015  . Corneal abrasion, left 06/08/2015  . Osteoporosis 05/17/2012  . Hypertension 02/28/2012  . 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 Diverticulitis 10/24/2011  . Pulmonic insufficiency 08/27/2010  . TREMOR, ESSENTIAL 06/20/2010  . CARDIOMEGALY, MILD 02/01/2010  . UNSPECIFIED  VITAMIN D DEFICIENCY 01/16/2010  . LIPOMAS, MULTIPLE 09/26/2008  . Anxiety state 07/14/2008  . Dyslipidemia 07/11/2008  . FIBROCYSTIC BREAST DISEASE 06/08/2008  . Irritable bowel syndrome 08/27/2007  . GERD 08/26/2007  . CAD S/P percutaneous coronary angioplasty 07/10/2007  . Migraine, unspecified, without mention of intractable migraine without mention of status migrainosus 03/12/2007  . ACUT MI ANTEROLAT WALL SUBSQT EPIS CARE 03/12/2007  . ALLERGIC RHINITIS 03/12/2007  . Diverticulosis of large intestine 11/25/2006    Current Outpatient Medications on File Prior to Visit  Medication Sig Dispense Refill  . acetaZOLAMIDE (DIAMOX) 250 MG tablet Take by mouth.    Marland Kitchen aspirin EC 81 MG tablet Take 1 tablet (81 mg total) by mouth every other day.    Marland Kitchen BIOTIN PO Take by mouth.    . Calcium Carbonate (CALTRATE 600 PO) Take 2 tablets by mouth 2 (two) times daily.     . Cholecalciferol (VITAMIN D3 PO) Take 1 tablet by mouth daily.    . clobetasol cream (TEMOVATE) [id number] % Apply 1 application topically once a week.     . cloNIDine (CATAPRES) 0.1 MG tablet TAKE 1 TABLET IN THE MORNING. 30 tablet 5  . Coenzyme Q10 (COQ10) 100 MG CAPS Take 1 capsule by mouth daily.     Marland Kitchen donepezil (ARICEPT) 10 MG tablet TAKE 2 TABLETS DAILY. 60 tablet PRN  . esomeprazole (NEXIUM) 40 MG capsule TAKE 1  CAPSULE IN THE MORNING BEFORE BREAKFAST. 30 capsule 0  . Ginkgo Biloba 40 MG TABS Take by mouth.    . loratadine (CLARITIN) 10 MG tablet Take 10 mg by mouth daily.    . metronidazole (NORITATE) 1 % cream Apply 1 application topically 2 (two) times daily.    . Multiple Vitamins-Minerals (CENTRUM SILVER PO) Take by mouth.    . naproxen sodium (RA NAPROXEN SODIUM) 220 MG tablet Take 220 mg by mouth 2 (two) times daily with a meal.     . oxymetazoline (AFRIN) 0.05 % nasal spray Place 1 spray into both nostrils as needed for congestion.    . Potassium (POTASSIMIN PO) Take by mouth daily.    . Psyllium (METAMUCIL PO) Take by  mouth. 4 by mouth daily    . QUEtiapine Fumarate (SEROQUEL XR) 150 MG 24 hr tablet Take 1 tablet (150 mg total) by mouth at bedtime. 30 tablet 5  . rosuvastatin (CRESTOR) 20 MG tablet Take 1 tablet (20 mg total) by mouth at bedtime. Please call for OV [phone] 30 tablet 3  . Simethicone (GAS-X PO) Take by mouth.    . Tafluprost (ZIOPTAN OP) Apply to eye. Left eye only for increased pressure     No current facility-administered medications on file prior to visit.    Past Medical History:  Diagnosis Date  . Abdominal bruit   . Abnormal weight gain   . Allergic rhinitis, cause unspecified   . CAD (coronary artery disease)    diaphragmatic wall infarction in 1999, treated with balloon angioplasty with nonobstructive disease at ast catheterization in 2007 as described above  . Colon polyp 12/20/2011   Tubular adenoma  . Depression   . Diverticular disease    acute diverticulitis 10/13/11  . Fibromyalgia   . GERD (gastroesophageal reflux disease)   . Hyperlipemia    with low HDL.  . IBS (irritable bowel syndrome)   . Migraine, unspecified, without mention of intractable migraine without mention of status migrainosus   . Tremor   . Unspecified adverse effect of unspecified drug, medicinal and biological substance   . Ventral incisional hernia 06/04/2015    Past Surgical History:  Procedure Laterality Date  . ABDOMINAL HYSTERECTOMY  1979   partial  . ABDOMINAL SURGERY      @ [facility]  in [place] in 2002  for ptosis of organs; mersilene mesh sling, cystocopy  by Dr  Rosana Hoes, Urologist  . BLADDER SURGERY  patient unsure of date   sling  . CATARACT EXTRACTION     left eye  . COLONOSCOPY  2011   Diverticulosis & hemorrhoids; Dr Olevia Perches  . LAPAROSCOPIC SIGMOID COLECTOMY  02/26/2012   Procedure: LAPAROSCOPIC SIGMOID COLECTOMY;  Surgeon: Adin Hector, MD;  Location: [facility];  Service: General;  Laterality: N/A;  Laparoscopic Assisted Sigmoid Colectomy  . PARTIAL COLECTOMY  02/26/2012    Procedure: PARTIAL COLECTOMY;  Surgeon: Adin Hector, MD;  Location: [facility];  Service: General;  Laterality: N/A;  Sigmoid Colectomy  . PARTIAL HYSTERECTOMY    . TONSILLECTOMY  as child  . [place]    Social History   Socioeconomic History  . Marital status: Married    Spouse name: Not on file  . Number of children: Not on file  . Years of education: Not on file  . Highest education level: Not on file  Occupational History  . Occupation: Retired Pharmacist, hospital  Tobacco Use  . Smoking status: Never Smoker  .  Smokeless tobacco: Never Used  . Tobacco comment: second hand smoke for decades  Substance and Sexual Activity  . Alcohol use: No  . Drug use: No  . Sexual activity: Not on file  Other Topics Concern  . Not on file  Social History Narrative   High fiber diet         Social Determinants of Health   Financial Resource Strain:   . Difficulty of Paying Living Expenses:   Food Insecurity:   . Worried About Charity fundraiser in the Last Year:   . Arboriculturist in the Last Year:   Transportation Needs:   . Film/video editor (Medical):   Marland Kitchen Lack of Transportation (Non-Medical):   Physical Activity:   . Days of Exercise per Week:   . Minutes of Exercise per Session:   Stress:   . Feeling of Stress :   Social Connections:   . Frequency of Communication with Friends and Family:   . Frequency of Social Gatherings with Friends and Family:   . Attends Religious Services:   . Active Member of Clubs or Organizations:   . Attends Archivist Meetings:   Marland Kitchen Marital Status:     Family History  Problem Relation Age of Onset  . Heart attack Father 38  . Ulcers Father   . Lung cancer Father   . Alcohol abuse Mother   . Heart attack Brother 48       1/2 BROTHER  . Bipolar disorder Sister   . Heart attack Sister 89  . Lymphoma Sister   . Stroke Sister        1/2 SISTER  . Other Brother        brain tumor  . Mental illness Son   . Colon  cancer Neg Hx     Review of Systems  Constitutional: Positive for fatigue. Negative for fever.  HENT: Negative for congestion, postnasal drip and sinus pressure.        No change in taste  Respiratory: Positive for shortness of breath. Negative for cough and wheezing.   Cardiovascular: Negative for palpitations and leg swelling.  Gastrointestinal: Negative for abdominal pain, blood in stool, constipation, diarrhea and nausea.  Neurological: Positive for light-headedness (when very SOB). Negative for dizziness and headaches.       Objective:   Vitals:   07/13/19 0936  BP: (!) 186/72  Pulse: 63  Resp: 18  Temp: 98.4 F (36.9 C)  SpO2: 99%   BP Readings from Last 3 Encounters:  07/13/19 (!) 186/72  10/23/18 140/68  05/12/18 120/78   Wt Readings from Last 3 Encounters:  07/13/19 126 lb (57.2 kg)  10/23/18 124 lb 4 oz (56.4 kg)  05/12/18 131 lb 9.8 oz (59.7 kg)   Body mass index is 23.81 kg/m.   Physical Exam    Constitutional: Appears well-developed and well-nourished. No distress.  Head: Normocephalic and atraumatic.  Neck: Neck supple. No tracheal deviation present. No thyromegaly present.  No cervical lymphadenopathy Cardiovascular: Normal rate, regular rhythm and normal heart sounds.  No murmur heard. No carotid bruit .  No edema Pulmonary/Chest: Effort normal and breath sounds normal. No respiratory distress. No has no wheezes. No rales.  Skin: Skin is warm and dry. Not diaphoretic.  Psychiatric: Normal mood and affect. Behavior is normal.       Assessment & Plan:    See Problem List for Assessment and Plan of chronic medical problems.    This visit  occurred during the SARS-CoV-2 public health emergency.  Safety protocols were in place, including screening questions prior to the visit, additional usage of staff PPE, and extensive cleaning of exam room while observing appropriate contact time as indicated for disinfecting solutions.

## 2019-07-13 ENCOUNTER — Ambulatory Visit (INDEPENDENT_AMBULATORY_CARE_PROVIDER_SITE_OTHER): Payer: Medicare Other | Admitting: Internal Medicine

## 2019-07-13 ENCOUNTER — Encounter: Payer: Self-pay | Admitting: Internal Medicine

## 2019-07-13 ENCOUNTER — Other Ambulatory Visit: Payer: Self-pay

## 2019-07-13 ENCOUNTER — Telehealth: Payer: Self-pay | Admitting: Internal Medicine

## 2019-07-13 ENCOUNTER — Ambulatory Visit (INDEPENDENT_AMBULATORY_CARE_PROVIDER_SITE_OTHER)
Admission: RE | Admit: 2019-07-13 | Discharge: 2019-07-13 | Disposition: A | Discharge status: Home / Self Care | Payer: Medicare Other | Source: Ambulatory Visit | Attending: Internal Medicine | Admitting: Internal Medicine

## 2019-07-13 VITALS — BP 186/72 | HR 63 | Temp 98.4°F | Resp 18 | Ht 61.0 in | Wt 126.0 lb

## 2019-07-13 DIAGNOSIS — R0602 Shortness of breath: Secondary | ICD-10-CM | POA: Diagnosis not present

## 2019-07-13 LAB — COMPREHENSIVE METABOLIC PANEL
ALT: 10 U/L (ref 0–35)
AST: 18 U/L (ref 0–37)
Albumin: 4.2 g/dL (ref 3.5–5.2)
Alkaline Phosphatase: 64 U/L (ref 39–117)
BUN: 16 mg/dL (ref 6–23)
CO2: 25 mEq/L (ref 19–32)
Calcium: 9.4 mg/dL (ref 8.4–10.5)
Chloride: 109 mEq/L (ref 96–112)
Creatinine, Ser: 0.81 mg/dL (ref 0.40–1.20)
GFR: 68.28 mL/min (ref >60.00)
Glucose, Bld: 98 mg/dL (ref 70–99)
Potassium: 3.5 mEq/L (ref 3.5–5.1)
Sodium: 141 mEq/L (ref 135–145)
Total Bilirubin: 0.4 mg/dL (ref 0.2–1.2)
Total Protein: 6.7 g/dL (ref 6.0–8.3)

## 2019-07-13 LAB — CBC WITH DIFFERENTIAL/PLATELET
Basophils Absolute: 0 10*3/uL (ref 0.0–0.1)
Basophils Relative: 0.8 % (ref 0.0–3.0)
Eosinophils Absolute: 0.1 10*3/uL (ref 0.0–0.7)
Eosinophils Relative: 2.4 % (ref 0.0–5.0)
HCT: 37.4 % (ref 36.0–46.0)
Hemoglobin: 12.5 g/dL (ref 12.0–15.0)
Lymphocytes Relative: 30.5 % (ref 12.0–46.0)
Lymphs Abs: 1.5 10*3/uL (ref 0.7–4.0)
MCHC: 33.4 g/dL (ref 30.0–36.0)
MCV: 102.9 fl — ABNORMAL HIGH (ref 78.0–100.0)
Monocytes Absolute: 0.7 10*3/uL (ref 0.1–1.0)
Monocytes Relative: 14.2 % — ABNORMAL HIGH (ref 3.0–12.0)
Neutro Abs: 2.5 10*3/uL (ref 1.4–7.7)
Neutrophils Relative %: 52.1 % (ref 43.0–77.0)
Platelets: 169 10*3/uL (ref 150.0–400.0)
RBC: 3.63 Mil/uL — ABNORMAL LOW (ref 3.87–5.11)
RDW: 12.6 % (ref 11.5–15.5)
WBC: 4.8 10*3/uL (ref 4.0–10.5)

## 2019-07-13 NOTE — Telephone Encounter (Signed)
Chest xray normal.  Blood work normal and EKG unchanged.    No concerning cause for shortness of breath.  Likely related to diamox given her history.

## 2019-07-13 NOTE — Patient Instructions (Addendum)
  Blood work was ordered.    An EKG was done.   A Chest xray was ordered.     Medications reviewed and updated.  Changes include :   none   We will call you with the results.

## 2019-07-13 NOTE — Assessment & Plan Note (Signed)
Acute Started about 1 week ago shortly after starting Diamox (started this 24-48 hours prior to the shortness of breath starting) Discussed this with the eye doctor who did not feel this was the cause of the shortness of breath.  No other changes in medications Has had some fatigue at times and confusion/memory glitches in the past week as well History of side effects to all eyedrops-cause shortness of breath Need to rule out other causes of shortness of breath EKG today: Sinus rhythm with occasional PVCs, 61 bpm, LAE, nonspecific ST T wave abnormality slightly more pronounced than previous EKG from 05/2018.  No other changes compared to previous EKG. Chest x-ray today We will check CBC, CMP She did notice that when she takes less of the Diamox her symptoms are better than when she takes it as prescribed 4 times a day No other concerning symptoms for angina, infection/lung disease She has no other choice for her eye pressure than Diamox at this time and can tolerate the shortness of breath-plan is to have surgery most likely next week Depending on above results we will see if further evaluation is necessary

## 2019-07-13 NOTE — Telephone Encounter (Signed)
Pt aware and expressed understanding.  

## 2019-07-14 ENCOUNTER — Other Ambulatory Visit: Payer: Self-pay | Admitting: Internal Medicine

## 2019-08-17 ENCOUNTER — Telehealth: Payer: Self-pay | Admitting: Internal Medicine

## 2019-08-17 MED ORDER — ESOMEPRAZOLE MAGNESIUM 40 MG PO CPDR: DELAYED_RELEASE_CAPSULE | ORAL | 0 refills | Status: AC

## 2019-08-17 NOTE — Telephone Encounter (Signed)
Rx sent 

## 2019-08-17 NOTE — Telephone Encounter (Signed)
New Message:   1.Medication Requested: esomeprazole (NEXIUM) 40 MG capsule 2. Pharmacy (Name, Street, [place]): [place], [place] 3. On Med List: Yes  4. Last Visit with PCP: 07/13/19  5. Next visit date with PCP:  Pt is aware that Dr. Carlean Purl is the provider who prescribed this medication but states she is no longer needing to see him and he asked that she get this medication transferred to her PCP. Please advise. Agent: Please be advised that RX refills may take up to 3 business days. We ask that you follow-up with your pharmacy.

## 2019-08-30 ENCOUNTER — Inpatient Hospital Stay (HOSPITAL_COMMUNITY): Payer: Medicare Other

## 2019-08-30 ENCOUNTER — Inpatient Hospital Stay (HOSPITAL_COMMUNITY)
Admission: EM | Admit: 2019-08-30 | Discharge: 2019-09-07 | DRG: 853 | Disposition: E | Payer: Medicare Other | Attending: Pulmonary Disease | Admitting: Pulmonary Disease

## 2019-08-30 ENCOUNTER — Emergency Department (HOSPITAL_COMMUNITY): Payer: Medicare Other

## 2019-08-30 ENCOUNTER — Encounter (HOSPITAL_COMMUNITY): Payer: Self-pay

## 2019-08-30 DIAGNOSIS — M797 Fibromyalgia: Secondary | ICD-10-CM | POA: Diagnosis present

## 2019-08-30 DIAGNOSIS — Z885 Allergy status to narcotic agent status: Secondary | ICD-10-CM

## 2019-08-30 DIAGNOSIS — K565 Intestinal adhesions [bands], unspecified as to partial versus complete obstruction: Secondary | ICD-10-CM | POA: Diagnosis present

## 2019-08-30 DIAGNOSIS — I4891 Unspecified atrial fibrillation: Secondary | ICD-10-CM | POA: Diagnosis not present

## 2019-08-30 DIAGNOSIS — E785 Hyperlipidemia, unspecified: Secondary | ICD-10-CM | POA: Diagnosis present

## 2019-08-30 DIAGNOSIS — Z79899 Other long term (current) drug therapy: Secondary | ICD-10-CM

## 2019-08-30 DIAGNOSIS — J95821 Acute postprocedural respiratory failure: Secondary | ICD-10-CM | POA: Diagnosis not present

## 2019-08-30 DIAGNOSIS — I2510 Atherosclerotic heart disease of native coronary artery without angina pectoris: Secondary | ICD-10-CM | POA: Diagnosis present

## 2019-08-30 DIAGNOSIS — K56609 Unspecified intestinal obstruction, unspecified as to partial versus complete obstruction: Secondary | ICD-10-CM

## 2019-08-30 DIAGNOSIS — R6521 Severe sepsis with septic shock: Secondary | ICD-10-CM | POA: Diagnosis present

## 2019-08-30 DIAGNOSIS — F329 Major depressive disorder, single episode, unspecified: Secondary | ICD-10-CM | POA: Diagnosis present

## 2019-08-30 DIAGNOSIS — Z8249 Family history of ischemic heart disease and other diseases of the circulatory system: Secondary | ICD-10-CM

## 2019-08-30 DIAGNOSIS — J309 Allergic rhinitis, unspecified: Secondary | ICD-10-CM | POA: Diagnosis present

## 2019-08-30 DIAGNOSIS — Z90711 Acquired absence of uterus with remaining cervical stump: Secondary | ICD-10-CM

## 2019-08-30 DIAGNOSIS — R188 Other ascites: Secondary | ICD-10-CM | POA: Diagnosis present

## 2019-08-30 DIAGNOSIS — E162 Hypoglycemia, unspecified: Secondary | ICD-10-CM | POA: Diagnosis not present

## 2019-08-30 DIAGNOSIS — I160 Hypertensive urgency: Secondary | ICD-10-CM | POA: Diagnosis present

## 2019-08-30 DIAGNOSIS — M810 Age-related osteoporosis without current pathological fracture: Secondary | ICD-10-CM | POA: Diagnosis present

## 2019-08-30 DIAGNOSIS — K631 Perforation of intestine (nontraumatic): Secondary | ICD-10-CM | POA: Diagnosis present

## 2019-08-30 DIAGNOSIS — R109 Unspecified abdominal pain: Secondary | ICD-10-CM

## 2019-08-30 DIAGNOSIS — Z91011 Allergy to milk products: Secondary | ICD-10-CM

## 2019-08-30 DIAGNOSIS — Z7722 Contact with and (suspected) exposure to environmental tobacco smoke (acute) (chronic): Secondary | ICD-10-CM | POA: Diagnosis present

## 2019-08-30 DIAGNOSIS — Z978 Presence of other specified devices: Secondary | ICD-10-CM

## 2019-08-30 DIAGNOSIS — Z9049 Acquired absence of other specified parts of digestive tract: Secondary | ICD-10-CM

## 2019-08-30 DIAGNOSIS — Z0189 Encounter for other specified special examinations: Secondary | ICD-10-CM

## 2019-08-30 DIAGNOSIS — D89839 Cytokine release syndrome, grade unspecified: Secondary | ICD-10-CM | POA: Diagnosis not present

## 2019-08-30 DIAGNOSIS — Z66 Do not resuscitate: Secondary | ICD-10-CM | POA: Diagnosis not present

## 2019-08-30 DIAGNOSIS — Z452 Encounter for adjustment and management of vascular access device: Secondary | ICD-10-CM

## 2019-08-30 DIAGNOSIS — Z9889 Other specified postprocedural states: Secondary | ICD-10-CM

## 2019-08-30 DIAGNOSIS — Z881 Allergy status to other antibiotic agents status: Secondary | ICD-10-CM

## 2019-08-30 DIAGNOSIS — E875 Hyperkalemia: Secondary | ICD-10-CM | POA: Diagnosis not present

## 2019-08-30 DIAGNOSIS — R34 Anuria and oliguria: Secondary | ICD-10-CM | POA: Diagnosis not present

## 2019-08-30 DIAGNOSIS — Z515 Encounter for palliative care: Secondary | ICD-10-CM | POA: Diagnosis not present

## 2019-08-30 DIAGNOSIS — K219 Gastro-esophageal reflux disease without esophagitis: Secondary | ICD-10-CM | POA: Diagnosis present

## 2019-08-30 DIAGNOSIS — R9431 Abnormal electrocardiogram [ECG] [EKG]: Secondary | ICD-10-CM | POA: Diagnosis not present

## 2019-08-30 DIAGNOSIS — D72829 Elevated white blood cell count, unspecified: Secondary | ICD-10-CM | POA: Diagnosis present

## 2019-08-30 DIAGNOSIS — Z91018 Allergy to other foods: Secondary | ICD-10-CM

## 2019-08-30 DIAGNOSIS — A419 Sepsis, unspecified organism: Principal | ICD-10-CM | POA: Diagnosis present

## 2019-08-30 DIAGNOSIS — K55021 Focal (segmental) acute infarction of small intestine: Secondary | ICD-10-CM | POA: Diagnosis present

## 2019-08-30 DIAGNOSIS — Z4659 Encounter for fitting and adjustment of other gastrointestinal appliance and device: Secondary | ICD-10-CM

## 2019-08-30 DIAGNOSIS — I252 Old myocardial infarction: Secondary | ICD-10-CM | POA: Diagnosis not present

## 2019-08-30 DIAGNOSIS — K589 Irritable bowel syndrome without diarrhea: Secondary | ICD-10-CM | POA: Diagnosis present

## 2019-08-30 DIAGNOSIS — E872 Acidosis: Secondary | ICD-10-CM | POA: Diagnosis present

## 2019-08-30 DIAGNOSIS — Z91048 Other nonmedicinal substance allergy status: Secondary | ICD-10-CM

## 2019-08-30 DIAGNOSIS — R0602 Shortness of breath: Secondary | ICD-10-CM

## 2019-08-30 DIAGNOSIS — N179 Acute kidney failure, unspecified: Secondary | ICD-10-CM | POA: Diagnosis present

## 2019-08-30 DIAGNOSIS — Z7982 Long term (current) use of aspirin: Secondary | ICD-10-CM

## 2019-08-30 DIAGNOSIS — G219 Secondary parkinsonism, unspecified: Secondary | ICD-10-CM | POA: Diagnosis present

## 2019-08-30 DIAGNOSIS — Z20822 Contact with and (suspected) exposure to covid-19: Secondary | ICD-10-CM | POA: Diagnosis present

## 2019-08-30 DIAGNOSIS — Z880 Allergy status to penicillin: Secondary | ICD-10-CM

## 2019-08-30 DIAGNOSIS — Z91040 Latex allergy status: Secondary | ICD-10-CM

## 2019-08-30 DIAGNOSIS — K7290 Hepatic failure, unspecified without coma: Secondary | ICD-10-CM | POA: Diagnosis not present

## 2019-08-30 DIAGNOSIS — Z888 Allergy status to other drugs, medicaments and biological substances status: Secondary | ICD-10-CM

## 2019-08-30 DIAGNOSIS — E222 Syndrome of inappropriate secretion of antidiuretic hormone: Secondary | ICD-10-CM

## 2019-08-30 HISTORY — DX: Other complications of anesthesia, initial encounter: T88.59XA

## 2019-08-30 LAB — CBC WITH DIFFERENTIAL/PLATELET
Abs Immature Granulocytes: 0.04 10*3/uL (ref 0.00–0.07)
Basophils Absolute: 0 10*3/uL (ref 0.0–0.1)
Basophils Relative: 0 %
Eosinophils Absolute: 0 10*3/uL (ref 0.0–0.5)
Eosinophils Relative: 0 %
HCT: 39.5 % (ref 36.0–46.0)
Hemoglobin: 13.4 g/dL (ref 12.0–15.0)
Immature Granulocytes: 0 %
Lymphocytes Relative: 8 %
Lymphs Abs: 0.9 10*3/uL (ref 0.7–4.0)
MCH: 33.8 pg (ref 26.0–34.0)
MCHC: 33.9 g/dL (ref 30.0–36.0)
MCV: 99.7 fL (ref 80.0–100.0)
Monocytes Absolute: 0.7 10*3/uL (ref 0.1–1.0)
Monocytes Relative: 7 %
Neutro Abs: 9.5 10*3/uL — ABNORMAL HIGH (ref 1.7–7.7)
Neutrophils Relative %: 85 %
Platelets: 187 10*3/uL (ref 150–400)
RBC: 3.96 MIL/uL (ref 3.87–5.11)
RDW: 11.7 % (ref 11.5–15.5)
WBC: 11.1 10*3/uL — ABNORMAL HIGH (ref 4.0–10.5)
nRBC: 0 % (ref 0.0–0.2)

## 2019-08-30 LAB — COMPREHENSIVE METABOLIC PANEL
ALT: 16 U/L (ref 0–44)
ALT: 16 U/L (ref 0–44)
AST: 31 U/L (ref 15–41)
AST: 31 U/L (ref 15–41)
Albumin: 4 g/dL (ref 3.5–5.0)
Albumin: 4.3 g/dL (ref 3.5–5.0)
Alkaline Phosphatase: 59 U/L (ref 38–126)
Alkaline Phosphatase: 71 U/L (ref 38–126)
Anion gap: 11 (ref 5–15)
Anion gap: 13 (ref 5–15)
BUN: 14 mg/dL (ref 8–23)
BUN: 10 mg/dL (ref 8–23)
CO2: 23 mmol/L (ref 22–32)
CO2: 20 mmol/L — ABNORMAL LOW (ref 22–32)
Calcium: 9.5 mg/dL (ref 8.9–10.3)
Calcium: 9.4 mg/dL (ref 8.9–10.3)
Chloride: 100 mmol/L (ref 98–111)
Chloride: 100 mmol/L (ref 98–111)
Creatinine, Ser: 0.75 mg/dL (ref 0.44–1.00)
Creatinine, Ser: 0.78 mg/dL (ref 0.44–1.00)
GFR calc Af Amer: >60 mL/min (ref >60)
GFR calc Af Amer: >60 mL/min (ref >60)
GFR calc non Af Amer: >60 mL/min (ref >60)
GFR calc non Af Amer: >60 mL/min (ref >60)
Glucose, Bld: 154 mg/dL — ABNORMAL HIGH (ref 70–99)
Glucose, Bld: 144 mg/dL — ABNORMAL HIGH (ref 70–99)
Potassium: 3.5 mmol/L (ref 3.5–5.1)
Potassium: 3.9 mmol/L (ref 3.5–5.1)
Sodium: 134 mmol/L — ABNORMAL LOW (ref 135–145)
Sodium: 133 mmol/L — ABNORMAL LOW (ref 135–145)
Total Bilirubin: 0.9 mg/dL (ref 0.3–1.2)
Total Bilirubin: 1.1 mg/dL (ref 0.3–1.2)
Total Protein: 6.8 g/dL (ref 6.5–8.1)
Total Protein: 7.4 g/dL (ref 6.5–8.1)

## 2019-08-30 LAB — TROPONIN I (HIGH SENSITIVITY)
Troponin I (High Sensitivity): 4 ng/L (ref <18)
Troponin I (High Sensitivity): 5 ng/L (ref <18)

## 2019-08-30 LAB — SARS CORONAVIRUS 2 BY RT PCR (HOSPITAL ORDER, PERFORMED IN ~~LOC~~ HOSPITAL LAB): SARS Coronavirus 2: NEGATIVE

## 2019-08-30 LAB — URINALYSIS, ROUTINE W REFLEX MICROSCOPIC
Bilirubin Urine: NEGATIVE
Glucose, UA: NEGATIVE mg/dL
Hgb urine dipstick: NEGATIVE
Ketones, ur: NEGATIVE mg/dL
Leukocytes,Ua: NEGATIVE
Nitrite: NEGATIVE
Protein, ur: NEGATIVE mg/dL
Specific Gravity, Urine: 1.02 (ref 1.005–1.030)
pH: 7 (ref 5.0–8.0)

## 2019-08-30 LAB — LIPASE, BLOOD: Lipase: 20 U/L (ref 11–51)

## 2019-08-30 MED ORDER — PANTOPRAZOLE SODIUM 40 MG IV SOLR
40.0000 mg | INTRAVENOUS | Status: DC
  Administered 2019-08-30 – 2019-09-05 (×6): 40 mg via INTRAVENOUS
  Filled 2019-08-30 (×6): qty 40

## 2019-08-30 MED ORDER — DIPHENHYDRAMINE HCL 50 MG/ML IJ SOLN
25.0000 mg | Freq: Every evening | INTRAMUSCULAR | Status: DC | PRN
  Administered 2019-08-31 – 2019-09-02 (×2): 25 mg via INTRAVENOUS
  Filled 2019-08-30 (×3): qty 1

## 2019-08-30 MED ORDER — MAGNESIUM SULFATE 2 GM/50ML IV SOLN
2.0000 g | Freq: Once | INTRAVENOUS | Status: DC
  Filled 2019-08-30: qty 50

## 2019-08-30 MED ORDER — ONDANSETRON HCL 4 MG/2ML IJ SOLN
4.0000 mg | Freq: Once | INTRAMUSCULAR | Status: AC
  Administered 2019-08-30: 4 mg via INTRAVENOUS
  Filled 2019-08-30: qty 2

## 2019-08-30 MED ORDER — DIATRIZOATE MEGLUMINE & SODIUM 66-10 % PO SOLN
90.0000 mL | Freq: Once | ORAL | Status: AC
  Administered 2019-08-30: 90 mL via NASOGASTRIC
  Filled 2019-08-30: qty 90

## 2019-08-30 MED ORDER — SODIUM CHLORIDE 0.9% FLUSH
3.0000 mL | Freq: Once | INTRAVENOUS | Status: AC
  Administered 2019-08-30: 3 mL via INTRAVENOUS

## 2019-08-30 MED ORDER — ACETAMINOPHEN 650 MG RE SUPP: 650.0000 mg | Freq: Four times a day (QID) | RECTAL | Status: DC | PRN

## 2019-08-30 MED ORDER — SODIUM CHLORIDE 0.9 % IV SOLN: INTRAVENOUS | Status: DC

## 2019-08-30 MED ORDER — CLONIDINE HCL 0.1 MG/24HR TD PTWK
0.1000 mg | MEDICATED_PATCH | TRANSDERMAL | Status: DC
  Administered 2019-08-30: 0.1 mg via TRANSDERMAL
  Filled 2019-08-30: qty 1

## 2019-08-30 MED ORDER — TAFLUPROST (PF) 0.0015 % OP SOLN
1.0000 [drp] | Freq: Every day | OPHTHALMIC | Status: DC
  Filled 2019-08-30 (×2): qty 1

## 2019-08-30 MED ORDER — HYDRALAZINE HCL 20 MG/ML IJ SOLN
10.0000 mg | INTRAMUSCULAR | Status: DC | PRN
  Administered 2019-08-30 – 2019-08-31 (×3): 10 mg via INTRAVENOUS
  Filled 2019-08-30 (×3): qty 1

## 2019-08-30 MED ORDER — SODIUM CHLORIDE 0.9 % IV BOLUS
1000.0000 mL | Freq: Once | INTRAVENOUS | Status: AC
  Administered 2019-08-30: 1000 mL via INTRAVENOUS

## 2019-08-30 MED ORDER — ONDANSETRON HCL 4 MG/2ML IJ SOLN
4.0000 mg | Freq: Four times a day (QID) | INTRAMUSCULAR | Status: DC | PRN
  Administered 2019-08-30: 4 mg via INTRAVENOUS
  Filled 2019-08-30: qty 2

## 2019-08-30 MED ORDER — IOHEXOL 300 MG/ML  SOLN
100.0000 mL | Freq: Once | INTRAMUSCULAR | Status: AC | PRN
  Administered 2019-08-30: 100 mL via INTRAVENOUS

## 2019-08-30 MED ORDER — POTASSIUM CHLORIDE 10 MEQ/100ML IV SOLN
10.0000 meq | INTRAVENOUS | Status: DC
  Administered 2019-08-30 (×2): 10 meq via INTRAVENOUS
  Filled 2019-08-30 (×2): qty 100

## 2019-08-30 MED ORDER — ONDANSETRON HCL 4 MG PO TABS: 4.0000 mg | ORAL_TABLET | Freq: Four times a day (QID) | ORAL | Status: DC | PRN

## 2019-08-30 MED ORDER — ENOXAPARIN SODIUM 40 MG/0.4ML ~~LOC~~ SOLN
40.0000 mg | SUBCUTANEOUS | Status: DC
  Administered 2019-08-31 – 2019-09-01 (×2): 40 mg via SUBCUTANEOUS
  Filled 2019-08-30 (×3): qty 0.4

## 2019-08-30 MED ORDER — FLUTICASONE PROPIONATE 50 MCG/ACT NA SUSP
1.0000 | Freq: Every day | NASAL | Status: DC | PRN
  Filled 2019-08-30: qty 16

## 2019-08-30 MED ORDER — ACETAMINOPHEN 325 MG PO TABS: 650.0000 mg | ORAL_TABLET | Freq: Four times a day (QID) | ORAL | Status: DC | PRN

## 2019-08-30 MED ORDER — MORPHINE SULFATE (PF) 4 MG/ML IV SOLN
4.0000 mg | Freq: Once | INTRAVENOUS | Status: AC
  Administered 2019-08-30: 4 mg via INTRAVENOUS
  Filled 2019-08-30: qty 1

## 2019-08-30 MED ORDER — MORPHINE SULFATE (PF) 2 MG/ML IV SOLN
2.0000 mg | INTRAVENOUS | Status: DC | PRN
  Administered 2019-08-30: 2 mg via INTRAVENOUS
  Filled 2019-08-30: qty 1

## 2019-08-30 MED ORDER — SODIUM CHLORIDE 0.9% FLUSH
3.0000 mL | Freq: Two times a day (BID) | INTRAVENOUS | Status: DC
  Administered 2019-08-31 – 2019-09-05 (×4): 3 mL via INTRAVENOUS

## 2019-08-30 NOTE — Consult Note (Signed)
[facility] Surgery Consult Note  [name] 1940/10/29  [id number].    Requesting MD: Theotis Burrow MD Chief Complaint/Reason for Consult: SBO  HPI:  [name] is a 79yo female PMH HTN, HLD, CAD and GERD who presented to [facility] last night with complaints of abdominal pain. States that it started around 1930 last night. The pain gradually became worse. It is diffuse but most severe in the lower abdomen. Associated with nausea and vomiting. Last BM 2 days ago. She is not passing any flatus today. No prior history of bowel obstruction. Denies fever, chills, chest pain, SOB, cough. ED work up included CT scan which shows mid small bowel obstruction with two potential areas of transition zone in the pelvis, associated with the small-bowel segments with mesenteric edema uncertain if represent adhesions or potentially closed loop obstruction. WBC 11.1, afebrile. BP elevated 188/68. General surgery asked to see.  -Abdominal surgical history: partial hysterectomy, laparoscopic low anterior resection 2013 by Dr. Dalbert Batman for recurrent diverticulitis  -Anticoagulants: none -Nonsmoker -Lives at home with her husband whom she cares for  Review of Systems  Constitutional: Negative.   HENT: Negative.   Eyes: Negative.   Respiratory: Negative.   Cardiovascular: Negative.   Gastrointestinal: Positive for abdominal pain, constipation, nausea and vomiting.  Genitourinary: Negative.   Musculoskeletal: Negative.   Skin: Negative.   Neurological: Negative.     All systems reviewed and otherwise negative except for as above  Family History  Problem Relation Age of Onset  . Heart attack Father 74  . Ulcers Father   . Lung cancer Father   . Alcohol abuse Mother   . Heart attack Brother 100       1/2 BROTHER  . Bipolar disorder Sister   . Heart attack Sister 77  . Lymphoma Sister   . Stroke Sister        1/2 SISTER  . Other Brother        brain tumor  . Mental illness Son   . Colon  cancer Neg Hx     Past Medical History:  Diagnosis Date  . Abdominal bruit   . Abnormal weight gain   . Allergic rhinitis, cause unspecified   . CAD (coronary artery disease)    diaphragmatic wall infarction in 1999, treated with balloon angioplasty with nonobstructive disease at ast catheterization in 2007 as described above  . Colon polyp 12/20/2011   Tubular adenoma  . Depression   . Diverticular disease    acute diverticulitis 10/13/11  . Fibromyalgia   . GERD (gastroesophageal reflux disease)   . Hyperlipemia    with low HDL.  . IBS (irritable bowel syndrome)   . Migraine, unspecified, without mention of intractable migraine without mention of status migrainosus   . Tremor   . Unspecified adverse effect of unspecified drug, medicinal and biological substance   . Ventral incisional hernia 06/04/2015    Past Surgical History:  Procedure Laterality Date  . ABDOMINAL HYSTERECTOMY  1979   partial  . ABDOMINAL SURGERY      @ [facility]  in [place] in 2002  for ptosis of organs; mersilene mesh sling, cystocopy  by Dr  Rosana Hoes, Urologist  . BLADDER SURGERY  patient unsure of date   sling  . CATARACT EXTRACTION     left eye  . COLONOSCOPY  2011   Diverticulosis & hemorrhoids; Dr Olevia Perches  . LAPAROSCOPIC SIGMOID COLECTOMY  02/26/2012   Procedure: LAPAROSCOPIC SIGMOID COLECTOMY;  Surgeon: Adin Hector, MD;  Location: [facility];  Service: General;  Laterality: N/A;  Laparoscopic Assisted Sigmoid Colectomy  . PARTIAL COLECTOMY  02/26/2012   Procedure: PARTIAL COLECTOMY;  Surgeon: Adin Hector, MD;  Location: [facility];  Service: General;  Laterality: N/A;  Sigmoid Colectomy  . PARTIAL HYSTERECTOMY    . TONSILLECTOMY  as child  . VAGIOCOELE     RECTOCOELE    Social History:  reports that she has never smoked. She has never used smokeless tobacco. She reports that she does not drink alcohol or use drugs.  Allergies:  Allergies  Allergen Reactions  . Amphetamine-Dextroamphet Er Other (See  Comments)    rash  . Hydromorphone Shortness Of Breath    BREATHING AND CARDIAC ISSUES  . Metronidazole Other (See Comments)    Unknown REACTION: SORES IN MOUTH, severe diarrhea  . Penicillins     RASH  . Thorazine [Chlorpromazine] Other (See Comments)    " had a total hepatic wipeout" in 1974  . Bupivacaine Other (See Comments)    Unknown  . Fentanyl Other (See Comments)    Mental issues  . Other Other (See Comments)    perservatives- GI problems; swelling; blood infection  . Atorvastatin Other (See Comments)    Upset stomach  . Azithromycin     Unknown reaction  . Brinzolamide-Brimonidine Dermatitis and Other (See Comments)    Shortness of Breath, irritation   . Cephalexin     Unknown reaction  . Chlorpromazine Hcl     Unknown reaction  . Ciprofloxacin     REACTION: SEVERE PAIN,DEHYDRATION,DIARRHEA  . Diamox [Acetazolamide] Other (See Comments)    Causes HBP  . Doxycycline Diarrhea  . Ezetimibe     REACTION: JOINT/MUSCLE ACHES  . Gentian Violet Other (See Comments)  . Hyoscyamine Sulfate     Unknown reaction  . Lactose Intolerance (Gi) Diarrhea  . Lidocaine Swelling  . Oatmeal Other (See Comments)     Sinus Congestion   . Sulfamethoxazole     REACTION: GI upset-acid reflux  . Telithromycin     Unknown reaction  . Topiramate     Unknown reaction  . Lactose Diarrhea  . Latex Rash    (Not in a hospital admission)   Prior to Admission medications   Medication Sig Start Date End Date Taking? Authorizing Provider  ascorbic acid (VITAMIN C) 500 MG tablet Take 500 mg by mouth daily.   Yes [provider]  aspirin EC 81 MG tablet Take 81 mg by mouth daily.  03/21/16  Yes Larey Dresser, MD  Biotin 5 MG TABS Take 5 mg by mouth daily.    Yes [provider]  Calcium Carbonate (CALTRATE 600 PO) Take 2 tablets by mouth 2 (two) times daily.    Yes [provider]  Cholecalciferol (VITAMIN D3) 50 MCG (2000 UT) capsule Take 2,000 Units by  mouth daily.    Yes [provider]  cloNIDine (CATAPRES) 0.1 MG tablet TAKE 1 TABLET IN THE MORNING. Patient taking differently: Take 0.1 mg by mouth daily.  05/04/19  Yes Cottle, Billey Co., MD  Coenzyme Q10 (COQ10) 100 MG CAPS Take 1 capsule by mouth daily.    Yes [provider]  donepezil (ARICEPT) 10 MG tablet TAKE 2 TABLETS DAILY. Patient taking differently: Take 20 mg by mouth daily.  10/26/18  Yes Cottle, Billey Co., MD  esomeprazole (NEXIUM) 40 MG capsule TAKE 1 CAPSULE IN THE MORNING BEFORE BREAKFAST. Patient taking differently: Take 40 mg by mouth daily.  TAKE 1 CAPSULE IN THE MORNING BEFORE BREAKFAST. 08/17/19  Yes Burns, Claudina Lick, MD  fluticasone (FLONASE) 50 MCG/ACT nasal spray Place 1 spray into both nostrils daily.   Yes [provider]  Ginkgo Biloba 40 MG TABS Take 40 mg by mouth daily.    Yes [provider]  loratadine (CLARITIN) 10 MG tablet Take 10 mg by mouth daily.   Yes [provider]  metroNIDAZOLE (METROCREAM) 0.75 % cream Apply 1 application topically 2 (two) times daily. 07/13/19  Yes [provider]  Multiple Vitamins-Minerals (CENTRUM SILVER PO) Take 1 tablet by mouth daily.    Yes [provider]  naproxen sodium (RA NAPROXEN SODIUM) 220 MG tablet Take 220 mg by mouth 2 (two) times daily with a meal.    Yes [provider]  oxymetazoline (AFRIN) 0.05 % nasal spray Place 1 spray into both nostrils as needed for congestion.   Yes [provider]  Potassium (POTASSIMIN PO) Take 99 mg by mouth daily.    Yes [provider]  psyllium (METAMUCIL) 0.52 g capsule Take 4 capsules by mouth as needed (constipation). 4 by mouth daily prn   Yes [provider]  QUEtiapine Fumarate (SEROQUEL XR) 150 MG 24 hr tablet Take 1 tablet (150 mg total) by mouth at bedtime. 03/17/19  Yes Cottle, Billey Co., MD  rosuvastatin (CRESTOR) 20 MG tablet Take 1 tablet (20 mg total) by mouth at bedtime.  Please call for OV [id number] 05/13/19  Yes Larey Dresser, MD  Simethicone (GAS-X PO) Take 1 tablet by mouth daily as needed (gerd).    Yes [provider]  Tafluprost (ZIOPTAN OP) Place 1 drop into both eyes daily.    Yes [provider]  zinc gluconate 50 MG tablet Take 50 mg by mouth daily.   Yes [provider]    Blood pressure (!) 174/95, pulse (!) 58, temperature 97.9 F (36.6 C), temperature source Oral, resp. rate 17, SpO2 100 %. Physical Exam: General: pleasant, WD/WN white female who is laying in bed in NAD HEENT: head is normocephalic, atraumatic.  Sclera are noninjected.  PERRL.  Ears and nose without any masses or lesions.  Mouth is pink and moist. Dentition fair Heart: regular, rate, and rhythm.  Normal s1,s2. No obvious murmurs, gallops, or rubs noted.  Palpable pedal pulses bilaterally  Lungs: CTAB, no wheezes, rhonchi, or rales noted.  Respiratory effort nonlabored Abd: soft, distended, few BS heard, no masses, hernias, or organomegaly. Mild subjective TTP left lower abdomen without rebound or guarding MS: no BUE/BLE edema, calves soft and nontender Skin: warm and dry with no masses, lesions, or rashes Psych: A&Ox4 with an appropriate affect Neuro: cranial nerves grossly intact, equal strength in BUE/BLE bilaterally, normal speech, thought process intact  Results for orders placed or performed during the hospital encounter of 08/17/2019 (from the past 48 hour(s))  Lipase, blood     Status: None   Collection Time: 08/17/2019  5:07 AM  Result Value Ref Range   Lipase 20 11 - 51 U/L    Comment: Performed at [facility], [address] [place], [place] [address]  Comprehensive metabolic panel     Status: Abnormal   Collection Time: 08/08/2019  5:07 AM  Result Value Ref Range   Sodium 134 (L) 135 - 145 mmol/L   Potassium 3.5 3.5 - 5.1 mmol/L   Chloride 100 98 - 111 mmol/L   CO2 23 22 - 32 mmol/L   Glucose,  Bld 154 (H) 70 - 99 mg/dL     Comment: Glucose reference range applies only to samples taken after fasting for at least 8 hours.   BUN 14 8 - 23 mg/dL   Creatinine, Ser 0.75 0.44 - 1.00 mg/dL   Calcium 9.5 8.9 - 10.3 mg/dL   Total Protein 6.8 6.5 - 8.1 g/dL   Albumin 4.0 3.5 - 5.0 g/dL   AST 31 15 - 41 U/L   ALT 16 0 - 44 U/L   Alkaline Phosphatase 59 38 - 126 U/L   Total Bilirubin 0.9 0.3 - 1.2 mg/dL   GFR calc non Af Amer >60 >60 mL/min   GFR calc Af Amer >60 >60 mL/min   Anion gap 11 5 - 15    Comment: Performed at [facility], [place] [address] [place], [place] [address]  CBC with Differential     Status: Abnormal   Collection Time: 08/24/2019  5:07 AM  Result Value Ref Range   WBC 11.1 (H) 4.0 - 10.5 K/uL   RBC 3.96 3.87 - 5.11 MIL/uL   Hemoglobin 13.4 12.0 - 15.0 g/dL   HCT 39.5 36.0 - 46.0 %   MCV 99.7 80.0 - 100.0 fL   MCH 33.8 26.0 - 34.0 pg   MCHC 33.9 30.0 - 36.0 g/dL   RDW 11.7 11.5 - 15.5 %   Platelets 187 150 - 400 K/uL   nRBC 0.0 0.0 - 0.2 %   Neutrophils Relative % 85 %   Neutro Abs 9.5 (H) 1.7 - 7.7 K/uL   Lymphocytes Relative 8 %   Lymphs Abs 0.9 0.7 - 4.0 K/uL   Monocytes Relative 7 %   Monocytes Absolute 0.7 0.1 - 1.0 K/uL   Eosinophils Relative 0 %   Eosinophils Absolute 0.0 0.0 - 0.5 K/uL   Basophils Relative 0 %   Basophils Absolute 0.0 0.0 - 0.1 K/uL   Immature Granulocytes 0 %   Abs Immature Granulocytes 0.04 0.00 - 0.07 K/uL    Comment: Performed at [facility] [address] [place], [place] [address]  Troponin I (High Sensitivity)     Status: None   Collection Time: 08/19/2019  5:07 AM  Result Value Ref Range   Troponin I (High Sensitivity) 4 <18 ng/L    Comment: (NOTE) Elevated high sensitivity troponin I (hsTnI) values and significant  changes across serial measurements may suggest ACS but many other  chronic and acute conditions are known to elevate hsTnI results.  Refer to the "Links" section for chest pain algorithms and additional  guidance. Performed  at [facility], [place] [address] [place], [place] [address]   Urinalysis, Routine w reflex microscopic     Status: None   Collection Time: 08/13/2019  8:07 AM  Result Value Ref Range   Color, Urine YELLOW YELLOW   APPearance CLEAR CLEAR   Specific Gravity, Urine 1.020 1.005 - 1.030   pH 7.0 5.0 - 8.0   Glucose, UA NEGATIVE NEGATIVE mg/dL   Hgb urine dipstick NEGATIVE NEGATIVE   Bilirubin Urine NEGATIVE NEGATIVE   Ketones, ur NEGATIVE NEGATIVE mg/dL   Protein, ur NEGATIVE NEGATIVE mg/dL   Nitrite NEGATIVE NEGATIVE   Leukocytes,Ua NEGATIVE NEGATIVE    Comment: Performed at [facility] [address] [place], [place] [address]  Troponin I (High Sensitivity)     Status: None   Collection Time: 09/01/2019  8:18 AM  Result Value Ref Range   Troponin I (  High Sensitivity) 5 <18 ng/L    Comment: (NOTE) Elevated high sensitivity troponin I (hsTnI) values and significant  changes across serial measurements may suggest ACS but many other  chronic and acute conditions are known to elevate hsTnI results.  Refer to the "Links" section for chest pain algorithms and additional  guidance. Performed at [facility], [place] [address], [place], [place] [address]   SARS Coronavirus 2 by RT PCR (hospital order, performed in [facility] hospital lab) Nasopharyngeal Nasopharyngeal Swab     Status: None   Collection Time: 08/18/2019  8:59 AM   Specimen: Nasopharyngeal Swab  Result Value Ref Range   SARS Coronavirus 2 NEGATIVE NEGATIVE    Comment: (NOTE) SARS-CoV-2 target nucleic acids are NOT DETECTED. The SARS-CoV-2 RNA is generally detectable in upper and lower respiratory specimens during the acute phase of infection. The lowest concentration of SARS-CoV-2 viral copies this assay can detect is 250 copies / mL. A negative result does not preclude SARS-CoV-2 infection and should not be used as the sole basis for treatment or other patient management decisions.  A negative result may  occur with improper specimen collection / handling, submission of specimen other than nasopharyngeal swab, presence of viral mutation(s) within the areas targeted by this assay, and inadequate number of viral copies (<250 copies / mL). A negative result must be combined with clinical observations, patient history, and epidemiological information. Fact Sheet for Patients:   StrictlyIdeas.no Fact Sheet for Healthcare Providers: BankingDealers.co.za This test is not yet approved or cleared  by the [place] FDA and has been authorized for detection and/or diagnosis of SARS-CoV-2 by FDA under an Emergency Use Authorization (EUA).  This EUA will remain in effect (meaning this test can be used) for the duration of the COVID-19 declaration under Section 564(b)(1) of the Act, 21 U.S.C. section 360bbb-3(b)(1), unless the authorization is terminated or revoked sooner. Performed at [facility], [place] [address], [place], [place] [address]    CT ABDOMEN PELVIS W CONTRAST  Result Date: 08/20/2019 CLINICAL DATA:  Abdominal pain and belching since 1930 hours last night, pain waxing and waning currently 10/10, associated nausea and vomiting EXAM: CT ABDOMEN AND PELVIS WITH CONTRAST TECHNIQUE: Multidetector CT imaging of the abdomen and pelvis was performed using the standard protocol following bolus administration of intravenous contrast. Sagittal and coronal MPR images reconstructed from axial data set. CONTRAST:  166mL OMNIPAQUE IOHEXOL 300 MG/ML SOLN IV. No oral contrast. COMPARISON:  11/08/2011 FINDINGS: Lower chest: Lung bases clear Hepatobiliary: Gallbladder surgically absent. Chronic low-attenuation focus lateral segment LEFT lobe liver unchanged, nonspecific. Liver otherwise unremarkable. Pancreas: Normal appearance Spleen: Normal appearance Adrenals/Urinary Tract: Small peripelvic cyst LEFT kidney. Cortical scar upper pole RIGHT kidney. Adrenal  glands, kidneys, ureters, and bladder otherwise normal appearance. Stomach/Bowel: Appendix not visualized. Redundant cecum. Diverticulosis of descending and sigmoid colon without evidence of diverticulitis. Distal sigmoid anastomosis. Stomach unremarkable. Dilated proximal and decompressed distal small bowel loops consistent with small bowel obstruction. Associated edema of mesentery involving small bowel loops in pelvis. Minimal scattered wall thickening of small bowel loops in pelvis. Two potential transition zones of small bowel loops in pelvis, associated with the segments of mesenteric edema, uncertain if represent adhesions or potentially closed loop obstruction. Vascular/Lymphatic: Atherosclerotic calcifications aorta and iliac arteries without aneurysm. No adenopathy. Reproductive: Uterus surgically absent with nonvisualization of ovaries. Multiple calcifications seen inferior to the urinary bladder within the perineum, nonspecific. Other: Minimal ascites.  No free air.  No hernia. Musculoskeletal:  Bones demineralized. IMPRESSION: Mid small bowel obstruction. Two potential areas of transition zone are seen in the pelvis, associated with the small-bowel segments with mesenteric edema uncertain if represent adhesions or potentially closed loop obstruction. No evidence of perforation. Nonspecific calcifications inferior to the urinary bladder within the perineum. Electronically Signed   By: Lavonia Dana M.D.   On: 08/16/2019 08:27   DG Abd Portable 1V-Small Bowel Protocol-Position Verification  Result Date: 08/10/2019 CLINICAL DATA:  Enteric tube placement EXAM: PORTABLE ABDOMEN - 1 VIEW COMPARISON:  CT abdomen/pelvis from earlier today FINDINGS: Enteric tube terminates in the proximal stomach. Mildly dilated small bowel loops in the visualized upper abdomen, unchanged. No evidence of pneumatosis or pneumoperitoneum in the upper abdomen. Clear lung bases. IMPRESSION: 1. Enteric tube terminates in the  proximal stomach. 2. Mildly dilated small bowel loops in the visualized upper abdomen, unchanged, compatible with known small bowel obstruction. Electronically Signed   By: Ilona Sorrel M.D.   On: 08/24/2019 10:58      Assessment/Plan HTN HLD CAD, MI 1995 GERD Hypertensive urgency - per primary  SBO, likely secondary to adhesions - CT scan shows mid small bowel obstruction with two potential areas of transition zone in the pelvis, associated with the small-bowel segments with mesenteric edema uncertain if represent adhesions or potentially closed loop obstruction. She has a mild leukocytosis but is minimally tender on exam. No role for acute surgical intervention, will attempt nonoperatively management. Continue NPO/NGT to LIWS. Will start small bowel protocol. We will follow.  ID - none VTE - SCDs, lovenox FEN - IVF, NPO/NGT to LIWS Foley - none Follow up - TBD  Wellington Hampshire, [facility] 08/24/2019, 11:12 AM Please see Amion for pager number during day hours 7:00am-4:30pm

## 2019-08-30 NOTE — ED Notes (Signed)
Pt is reporting abdominal pain of 9 on a 0-10 scale. EDP notified

## 2019-08-30 NOTE — ED Triage Notes (Signed)
Pt comes from home via [facility], c/o of n/v and abd pain after eating some shrimp salad. Denies fever or diarrhea.

## 2019-08-30 NOTE — ED Provider Notes (Signed)
I received pt in signout from Dr. Roxanne Mins. Awaiting CT abd/pelvis.  CT shows SBO with 2 transition zones. Pt does have hx of multiple abd surgeries. Consulted gen surgery and discussed w/ APP; they will see pt in consultation. I have ordered NG tube.   Discussed admission w/ [facility], Dr. Tamala Julian.    [name], Janet Overland, MD 09/02/2019 [phone]

## 2019-08-30 NOTE — ED Notes (Signed)
Pt now c/o of SOB

## 2019-08-30 NOTE — H&P (Addendum)
History and Physical    [name] [id number] DOB: [birth year] DOA: 08/14/2019  Referring MD/NP/PA: Theotis Burrow, MD PCP: Binnie Rail, MD  Patient coming from: Home via EMS  Chief Complaint: Abdominal pain-  I have personally briefly reviewed patient's old medical records in [place]   HPI: [name] is a 79 y.o. female with medical history significant of HLD, nonobstructive CAD, IBS, depression, and fibromyalgia presents with complaints of abdominal pain and belching.  Symptoms started last night and she was about to finish eating shrimp and potato salad.  Pain was located in the lower part of her abdomen.  Thought possibly symptoms secondary to acid reflux or food was bad.  She had tried drinking water, ginger ale, Gaviscon, Pepcid, and even a suppository.  Gaviscon helped settle her stomach only temporarily and belching also provided temporary relief.  After the suppository she had 2 very small stools.  The abdominal pain waxed and waned in intensity.  Other associated symptoms included nausea and vomited at least once.  Denies recently passing flatus, fever, shortness of breath, leg swelling, diarrhea, or dysuria.  She has never had symptoms like this before in the past.  Patient does report history of a ventral hernia repair, partial hysterectomy, and partial colectomy due to recurrent diverticulitis.  ED Course: Patient into the emergency department patient was noted to be afebrile, pulse 58-68, respirations 13-25, blood pressure is elevated up to 188/68, and O2 saturations maintained on room air.  Labs significant for WBC 11.1.  CT scan of the abdomen and pelvis revealed signs of a mid small bowel obstruction with 2 transition zones.  General surgery was consulted.  NG tube was ordered.  Patient was given 1 L normal saline IV fluids, morphine for pain, and antiemetics.  TRH called to admit.  Review of Systems  Constitutional: Positive for malaise/fatigue. Negative for  fever.  HENT: Negative for congestion and nosebleeds.   Eyes: Negative for photophobia and pain.  Respiratory: Negative for cough and shortness of breath.   Cardiovascular: Negative for chest pain and leg swelling.  Gastrointestinal: Positive for abdominal pain, nausea and vomiting.  Genitourinary: Negative for dysuria and frequency.  Musculoskeletal: Negative for falls.  Skin: Negative for rash.  Neurological: Negative for focal weakness and loss of consciousness.  Psychiatric/Behavioral: Negative for memory loss and substance abuse.    Past Medical History:  Diagnosis Date  . Abdominal bruit   . Abnormal weight gain   . Allergic rhinitis, cause unspecified   . CAD (coronary artery disease)    diaphragmatic wall infarction in 1999, treated with balloon angioplasty with nonobstructive disease at ast catheterization in 2007 as described above  . Colon polyp 12/20/2011   Tubular adenoma  . Depression   . Diverticular disease    acute diverticulitis 10/13/11  . Fibromyalgia   . GERD (gastroesophageal reflux disease)   . Hyperlipemia    with low HDL.  . IBS (irritable bowel syndrome)   . Migraine, unspecified, without mention of intractable migraine without mention of status migrainosus   . Tremor   . Unspecified adverse effect of unspecified drug, medicinal and biological substance   . Ventral incisional hernia 06/04/2015    Past Surgical History:  Procedure Laterality Date  . ABDOMINAL HYSTERECTOMY  1979   partial  . ABDOMINAL SURGERY      @ [facility]  in [place] in 2002  for ptosis of organs; mersilene mesh sling, cystocopy  by Dr  Rosana Hoes, Urologist  .  BLADDER SURGERY  patient unsure of date   sling  . CATARACT EXTRACTION     left eye  . COLONOSCOPY  2011   Diverticulosis & hemorrhoids; Dr Olevia Perches  . LAPAROSCOPIC SIGMOID COLECTOMY  02/26/2012   Procedure: LAPAROSCOPIC SIGMOID COLECTOMY;  Surgeon: Adin Hector, MD;  Location: [facility];  Service: General;  Laterality: N/A;   Laparoscopic Assisted Sigmoid Colectomy  . PARTIAL COLECTOMY  02/26/2012   Procedure: PARTIAL COLECTOMY;  Surgeon: Adin Hector, MD;  Location: [facility];  Service: General;  Laterality: N/A;  Sigmoid Colectomy  . PARTIAL HYSTERECTOMY    . TONSILLECTOMY  as child  . VAGIOCOELE     RECTOCOELE     reports that she has never smoked. She has never used smokeless tobacco. She reports that she does not drink alcohol or use drugs.  Allergies  Allergen Reactions  . Amphetamine-Dextroamphet Er Other (See Comments)    rash  . Hydromorphone Shortness Of Breath    BREATHING AND CARDIAC ISSUES  . Metronidazole Other (See Comments)    Unknown REACTION: SORES IN MOUTH, severe diarrhea  . Penicillins     RASH  . Thorazine [Chlorpromazine] Other (See Comments)    " had a total hepatic wipeout" in 1974  . Bupivacaine Other (See Comments)    Unknown  . Fentanyl Other (See Comments)    Mental issues  . Other Other (See Comments)    perservatives- GI problems; swelling; blood infection  . Atorvastatin Other (See Comments)    Upset stomach  . Azithromycin     Unknown reaction  . Cephalexin     Unknown reaction  . Chlorpromazine Hcl     Unknown reaction  . Ciprofloxacin     REACTION: SEVERE PAIN,DEHYDRATION,DIARRHEA  . Doxycycline Diarrhea  . Ezetimibe     REACTION: JOINT/MUSCLE ACHES  . Hyoscyamine Sulfate     Unknown reaction  . Lactose Intolerance (Gi) Diarrhea  . Lidocaine Swelling  . Oatmeal Other (See Comments)     Sinus Congestion   . Sulfamethoxazole     REACTION: GI upset-acid reflux  . Telithromycin     Unknown reaction  . Topiramate     Unknown reaction  . Lactose Diarrhea  . Latex Rash    Family History  Problem Relation Age of Onset  . Heart attack Father 11  . Ulcers Father   . Lung cancer Father   . Alcohol abuse Mother   . Heart attack Brother 87       1/2 BROTHER  . Bipolar disorder Sister   . Heart attack Sister 50  . Lymphoma Sister   . Stroke  Sister        1/2 SISTER  . Other Brother        brain tumor  . Mental illness Son   . Colon cancer Neg Hx     Prior to Admission medications   Medication Sig Start Date End Date Taking? Authorizing Provider  acetaZOLAMIDE (DIAMOX) 250 MG tablet Take by mouth. 07/06/19 08/05/19  [provider]  aspirin EC 81 MG tablet Take 1 tablet (81 mg total) by mouth every other day. 03/21/16   Larey Dresser, MD  BIOTIN PO Take by mouth.    [provider]  Calcium Carbonate (CALTRATE 600 PO) Take 2 tablets by mouth 2 (two) times daily.     [provider]  Cholecalciferol (VITAMIN D3 PO) Take 1 tablet by mouth daily.    [provider]  clobetasol cream (  TEMOVATE) [id number] % Apply 1 application topically once a week.     [provider]  cloNIDine (CATAPRES) 0.1 MG tablet TAKE 1 TABLET IN THE MORNING. 05/04/19   Cottle, Billey Co., MD  Coenzyme Q10 (COQ10) 100 MG CAPS Take 1 capsule by mouth daily.     [provider]  donepezil (ARICEPT) 10 MG tablet TAKE 2 TABLETS DAILY. 10/26/18   Cottle, Billey Co., MD  esomeprazole (NEXIUM) 40 MG capsule TAKE 1 CAPSULE IN THE MORNING BEFORE BREAKFAST. 08/17/19   Binnie Rail, MD  Ginkgo Biloba 40 MG TABS Take by mouth.    [provider]  loratadine (CLARITIN) 10 MG tablet Take 10 mg by mouth daily.    [provider]  metronidazole (NORITATE) 1 % cream Apply 1 application topically 2 (two) times daily.    [provider]  Multiple Vitamins-Minerals (CENTRUM SILVER PO) Take by mouth.    [provider]  naproxen sodium (RA NAPROXEN SODIUM) 220 MG tablet Take 220 mg by mouth 2 (two) times daily with a meal.     [provider]  oxymetazoline (AFRIN) 0.05 % nasal spray Place 1 spray into both nostrils as needed for congestion.    [provider]  Potassium (POTASSIMIN PO) Take by mouth daily.    [provider]  Psyllium (METAMUCIL PO) Take by mouth.  4 by mouth daily    [provider]  QUEtiapine Fumarate (SEROQUEL XR) 150 MG 24 hr tablet Take 1 tablet (150 mg total) by mouth at bedtime. 03/17/19   Cottle, Billey Co., MD  rosuvastatin (CRESTOR) 20 MG tablet Take 1 tablet (20 mg total) by mouth at bedtime. Please call for OV [id number] 05/13/19   Larey Dresser, MD  Simethicone (GAS-X PO) Take by mouth.    [provider]  Tafluprost (ZIOPTAN OP) Apply to eye. Left eye only for increased pressure    [provider]    Physical Exam:  Constitutional: Elderly female who appears to be in no acute distress at this time Vitals:   08/16/2019 0847 08/09/2019 0848 09/03/2019 0849 08/08/2019 0850  BP:    (!) 188/68  Pulse: 64 63 60 64  Resp: (!) 21 19 (!) 25 19  Temp:      TempSrc:      SpO2: 100% 100% 100% 100%   Eyes: PERRL, lids and conjunctivae normal ENMT: Mucous membranes are dry. Posterior pharynx clear of any exudate or lesions.  Neck: normal, supple, no masses, no thyromegaly Respiratory: clear to auscultation bilaterally, no wheezing, no crackles. Normal respiratory effort. No accessory muscle use.  Cardiovascular: Regular rate and rhythm, no murmurs / rubs / gallops. No extremity edema. 2+ pedal pulses. No carotid bruits.  Abdomen: Lower abdominal tenderness to palpation.  Bowel sounds decreased. Musculoskeletal: no clubbing / cyanosis. No joint deformity upper and lower extremities. Good ROM, no contractures. Normal muscle tone.  Skin: no rashes, lesions, ulcers. No induration Neurologic: CN 2-12 grossly intact. Sensation intact, DTR normal. Strength 5/5 in all 4.  Psychiatric: Normal judgment and insight. Alert and oriented x 3. Normal mood.     Labs on Admission: I have personally reviewed following labs and imaging studies  CBC: Recent Labs  Lab 08/07/2019 0507  WBC 11.1*  NEUTROABS 9.5*  HGB 13.4  HCT 39.5  MCV 99.7  PLT [id number]   Basic Metabolic Panel: Recent Labs  Lab 09/03/2019 0507  NA 134*   K 3.5  CL 100  CO2 23  GLUCOSE 154*  BUN 14  CREATININE 0.75  CALCIUM 9.5   GFR: CrCl cannot be calculated (Unknown ideal weight.). Liver Function Tests: Recent Labs  Lab 09/01/2019 0507  AST 31  ALT 16  ALKPHOS 59  BILITOT 0.9  PROT 6.8  ALBUMIN 4.0   Recent Labs  Lab 08/07/2019 0507  LIPASE 20   No results for input(s): AMMONIA in the last 168 hours. Coagulation Profile: No results for input(s): INR, PROTIME in the last 168 hours. Cardiac Enzymes: No results for input(s): CKTOTAL, CKMB, CKMBINDEX, TROPONINI in the last 168 hours. BNP (last 3 results) No results for input(s): PROBNP in the last 8760 hours. HbA1C: No results for input(s): HGBA1C in the last 72 hours. CBG: No results for input(s): GLUCAP in the last 168 hours. Lipid Profile: No results for input(s): CHOL, HDL, LDLCALC, TRIG, CHOLHDL, LDLDIRECT in the last 72 hours. Thyroid Function Tests: No results for input(s): TSH, T4TOTAL, FREET4, T3FREE, THYROIDAB in the last 72 hours. Anemia Panel: No results for input(s): VITAMINB12, FOLATE, FERRITIN, TIBC, IRON, RETICCTPCT in the last 72 hours. Urine analysis:    Component Value Date/Time   COLORURINE YELLOW 02/24/2012 1010   APPEARANCEUR CLOUDY (A) 02/24/2012 1010   LABSPEC 1.035 (H) 02/24/2012 1010   PHURINE 5.0 02/24/2012 1010   GLUCOSEU NEGATIVE 02/24/2012 1010   HGBUR NEGATIVE 02/24/2012 1010   BILIRUBINUR MODERATE (A) 02/24/2012 1010   BILIRUBINUR Neg 10/14/2011 1344   KETONESUR 15 (A) 02/24/2012 1010   PROTEINUR NEGATIVE 02/24/2012 1010   UROBILINOGEN 0.2 02/24/2012 1010   NITRITE NEGATIVE 02/24/2012 1010   LEUKOCYTESUR NEGATIVE 02/24/2012 1010   Sepsis Labs: No results found for this or any previous visit (from the past 240 hour(s)).   Radiological Exams on Admission: CT ABDOMEN PELVIS W CONTRAST  Result Date: 08/15/2019 CLINICAL DATA:  Abdominal pain and belching since 1930 hours last night, pain waxing and waning currently 10/10,  associated nausea and vomiting EXAM: CT ABDOMEN AND PELVIS WITH CONTRAST TECHNIQUE: Multidetector CT imaging of the abdomen and pelvis was performed using the standard protocol following bolus administration of intravenous contrast. Sagittal and coronal MPR images reconstructed from axial data set. CONTRAST:  118mL OMNIPAQUE IOHEXOL 300 MG/ML SOLN IV. No oral contrast. COMPARISON:  11/08/2011 FINDINGS: Lower chest: Lung bases clear Hepatobiliary: Gallbladder surgically absent. Chronic low-attenuation focus lateral segment LEFT lobe liver unchanged, nonspecific. Liver otherwise unremarkable. Pancreas: Normal appearance Spleen: Normal appearance Adrenals/Urinary Tract: Small peripelvic cyst LEFT kidney. Cortical scar upper pole RIGHT kidney. Adrenal glands, kidneys, ureters, and bladder otherwise normal appearance. Stomach/Bowel: Appendix not visualized. Redundant cecum. Diverticulosis of descending and sigmoid colon without evidence of diverticulitis. Distal sigmoid anastomosis. Stomach unremarkable. Dilated proximal and decompressed distal small bowel loops consistent with small bowel obstruction. Associated edema of mesentery involving small bowel loops in pelvis. Minimal scattered wall thickening of small bowel loops in pelvis. Two potential transition zones of small bowel loops in pelvis, associated with the segments of mesenteric edema, uncertain if represent adhesions or potentially closed loop obstruction. Vascular/Lymphatic: Atherosclerotic calcifications aorta and iliac arteries without aneurysm. No adenopathy. Reproductive: Uterus surgically absent with nonvisualization of ovaries. Multiple calcifications seen inferior to the urinary bladder within the perineum, nonspecific. Other: Minimal ascites.  No free air.  No hernia. Musculoskeletal: Bones demineralized. IMPRESSION: Mid small bowel obstruction. Two potential areas of transition zone are seen in the pelvis, associated with the small-bowel segments  with mesenteric edema uncertain if represent adhesions or potentially closed loop obstruction. No evidence of  perforation. Nonspecific calcifications inferior to the urinary bladder within the perineum. Electronically Signed   By: Lavonia Dana M.D.   On: 08/07/2019 08:27    EKG: Independently reviewed.  Sinus bradycardia 59 bpm  Assessment/Plan Small bowel obstruction: Acute.  Presents with complaints of nausea, vomiting, and abdominal pain after eating shrimp salad.  CT of the abdomen and pelvis significant for mid small bowel obstruction.  Patient with previous history of multiple abdominal surgeries including partial colectomy due to recurrent diverticulitis, partial hysterectomy, and ventral hernia repair.  Suspect symptoms secondary to adhesions.  General surgery was formally consulted. -Admit to a MedSurg bed -Small bowel order set utilized -N.p.o. -NGT to suction -Monitor intake and output -Morphine IV as needed for pain -Antiemetics as needed for nausea/vomiting -Normal saline IV fluid 75 mL/h -Check serial abdominal x-ray -Appreciate general surgery consultative services, will follow-up for any further recommendations   Leukocytosis: Acute.  WBC elevated at 11.1 on admission.  Suspect this is reactive in nature to above. -Recheck CBC in a.m.  Hypertensive urgency: Acute.  On admission blood pressures elevated up to 188/68.  Home medications include clonidine 0.1 mg daily. -Change to clonidine patch while n.p.o. -Hydralazine IV as needed  GERD  -Pharmacy substitution of Protonix IV   All other medications on hold due to patient being n.p.o. reassess when patient able to tolerate p.o. to restart these medications. Follow-up COVID-19 screening  DVT prophylaxis: Lovenox Code Status: Full Family Communication: Family to be updated Disposition Plan: Likely discharge home once small bowel obstruction resolved Consults called: Surgery Admission status: inpatient  Norval Morton  MD [facility] Pager [phone]   If 7PM-7AM, please contact night-coverage [email or web address] Password [facility]  08/16/2019, 9:04 AM

## 2019-08-30 NOTE — ED Notes (Signed)
Ambulated to the bathroom 

## 2019-08-30 NOTE — Progress Notes (Signed)
Patient had called out in distress with complaints of shortness of breath, chest discomfort, and worsening belching.  EKG revealed prolonged QT of 631.  Evaluated patient and lung sounds appear clear.  Check chest x-ray which showed no signs of free air underneath the diaphragm and lungs showed no acute abnormalities.  Question if symptoms brought on by patient being given morphine recently although she has tolerated 2-3 doses.  Patient has multiple allergies.  Discontinued morphine due to possible allergy and antiemetics with prolonged QT.  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  CMP revealed sodium134-> 133.  Discontinue IV fluids and fluid restrict at this time.  Continue to monitor and replace on fluids as needed.

## 2019-08-30 NOTE — ED Notes (Signed)
Pt c/o left sided chest discomfort, shortness of breath, and belching. New EKG obtained, Dr. Tamala Julian aware.

## 2019-08-30 NOTE — ED Provider Notes (Signed)
[facility] EMERGENCY DEPARTMENT Provider Note   CSN: [id number] Arrival date & time: 08/29/2019  0445   History Chief Complaint  Patient presents with  . Abdominal Pain    [name] is a 79 y.o. female.  The history is provided by the patient.  Abdominal Pain She has history of hypertension, hyperlipidemia, coronary artery disease and comes in complaining of abdominal pain and belching.  Symptoms started at about 7:30 PM following eating dinner of shrimp salad.  Patient is concerned that the shrimp salad was bad.  Pain has been waxing and waning and she currently rates it at 10/10.  There is no radiation to the back or chest.  She does feel better momentarily after belching.  She denies having any flatus.  There has been nausea and she vomited once, but she is not feeling nauseous currently.  She has not taken anything for her pain.  Past Medical History:  Diagnosis Date  . Abdominal bruit   . Abnormal weight gain   . Allergic rhinitis, cause unspecified   . CAD (coronary artery disease)    diaphragmatic wall infarction in 1999, treated with balloon angioplasty with nonobstructive disease at ast catheterization in 2007 as described above  . Colon polyp 12/20/2011   Tubular adenoma  . Depression   . Diverticular disease    acute diverticulitis 10/13/11  . Fibromyalgia   . GERD (gastroesophageal reflux disease)   . Hyperlipemia    with low HDL.  . IBS (irritable bowel syndrome)   . Migraine, unspecified, without mention of intractable migraine without mention of status migrainosus   . Tremor   . Unspecified adverse effect of unspecified drug, medicinal and biological substance   . Ventral incisional hernia 06/04/2015    Patient Active Problem List   Diagnosis Date Noted  . SOB (shortness of breath) 07/13/2019  . Chest tightness 08/14/2017  . Prediabetes 06/10/2016  . Anesthesia complication [id number]  . Abnormal CT scan, lung 08/11/15 08/19/2015  . 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 S/P herniorrhaphy 08/01/2015  . Corneal abrasion, left 06/08/2015  . Osteoporosis 05/17/2012  . Hypertension 02/28/2012  . 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 Diverticulitis 10/24/2011  . Pulmonic insufficiency 08/27/2010  . TREMOR, ESSENTIAL 06/20/2010  . CARDIOMEGALY, MILD 02/01/2010  . UNSPECIFIED VITAMIN D DEFICIENCY 01/16/2010  . LIPOMAS, MULTIPLE 09/26/2008  . Anxiety state 07/14/2008  . Dyslipidemia 07/11/2008  . FIBROCYSTIC BREAST DISEASE 06/08/2008  . Irritable bowel syndrome 08/27/2007  . GERD 08/26/2007  . CAD S/P percutaneous coronary angioplasty 07/10/2007  . Migraine, unspecified, without mention of intractable migraine without mention of status migrainosus 03/12/2007  . ACUT MI ANTEROLAT WALL SUBSQT EPIS CARE 03/12/2007  . ALLERGIC RHINITIS 03/12/2007  . Diverticulosis of large intestine 11/25/2006    Past Surgical History:  Procedure Laterality Date  . ABDOMINAL HYSTERECTOMY  1979   partial  . ABDOMINAL SURGERY      @ [facility]  in [place] in 2002  for ptosis of organs; mersilene mesh sling, cystocopy  by Dr  Rosana Hoes, Urologist  . BLADDER SURGERY  patient unsure of date   sling  . CATARACT EXTRACTION     left eye  . COLONOSCOPY  2011   Diverticulosis & hemorrhoids; Dr Olevia Perches  . LAPAROSCOPIC SIGMOID COLECTOMY  02/26/2012   Procedure: LAPAROSCOPIC SIGMOID COLECTOMY;  Surgeon: Adin Hector, MD;  Location: [facility]S;  Service: General;  Laterality: N/A;  Laparoscopic Assisted Sigmoid Colectomy  . PARTIAL COLECTOMY  02/26/2012   Procedure: PARTIAL COLECTOMY;  Surgeon: Adin Hector, MD;  Location: [facility];  Service: General;  Laterality: N/A;  Sigmoid Colectomy  . PARTIAL HYSTERECTOMY    . TONSILLECTOMY  as child  . VAGIOCOELE     RECTOCOELE     OB History   No obstetric history on file.     Family History  Problem Relation Age of Onset  . Heart attack Father 106  . Ulcers Father   . Lung  cancer Father   . Alcohol abuse Mother   . Heart attack Brother 13       1/2 BROTHER  . Bipolar disorder Sister   . Heart attack Sister 58  . Lymphoma Sister   . Stroke Sister        1/2 SISTER  . Other Brother        brain tumor  . Mental illness Son   . Colon cancer Neg Hx     Social History   Tobacco Use  . Smoking status: Never Smoker  . Smokeless tobacco: Never Used  . Tobacco comment: second hand smoke for decades  Substance Use Topics  . Alcohol use: No  . Drug use: No    Home Medications Prior to Admission medications   Medication Sig Start Date End Date Taking? Authorizing Provider  acetaZOLAMIDE (DIAMOX) 250 MG tablet Take by mouth. 07/06/19 08/05/19  [provider]  aspirin EC 81 MG tablet Take 1 tablet (81 mg total) by mouth every other day. 03/21/16   Larey Dresser, MD  BIOTIN PO Take by mouth.    [provider]  Calcium Carbonate (CALTRATE 600 PO) Take 2 tablets by mouth 2 (two) times daily.     [provider]  Cholecalciferol (VITAMIN D3 PO) Take 1 tablet by mouth daily.    [provider]  clobetasol cream (TEMOVATE) [id number] % Apply 1 application topically once a week.     [provider]  cloNIDine (CATAPRES) 0.1 MG tablet TAKE 1 TABLET IN THE MORNING. 05/04/19   Cottle, Billey Co., MD  Coenzyme Q10 (COQ10) 100 MG CAPS Take 1 capsule by mouth daily.     [provider]  donepezil (ARICEPT) 10 MG tablet TAKE 2 TABLETS DAILY. 10/26/18   Cottle, Billey Co., MD  esomeprazole (NEXIUM) 40 MG capsule TAKE 1 CAPSULE IN THE MORNING BEFORE BREAKFAST. 08/17/19   Binnie Rail, MD  Ginkgo Biloba 40 MG TABS Take by mouth.    [provider]  loratadine (CLARITIN) 10 MG tablet Take 10 mg by mouth daily.    [provider]  metronidazole (NORITATE) 1 % cream Apply 1 application topically 2 (two) times daily.    [provider]  Multiple Vitamins-Minerals (CENTRUM SILVER PO) Take by mouth.     [provider]  naproxen sodium (RA NAPROXEN SODIUM) 220 MG tablet Take 220 mg by mouth 2 (two) times daily with a meal.     [provider]  oxymetazoline (AFRIN) 0.05 % nasal spray Place 1 spray into both nostrils as needed for congestion.    [provider]  Potassium (POTASSIMIN PO) Take by mouth daily.    [provider]  Psyllium (METAMUCIL PO) Take by mouth. 4 by mouth daily    [provider]  QUEtiapine Fumarate (SEROQUEL XR) 150 MG 24 hr tablet Take 1 tablet (150 mg total) by mouth at bedtime. 03/17/19   Cottle, Billey Co., MD  rosuvastatin ([place]) 20  MG tablet Take 1 tablet (20 mg total) by mouth at bedtime. Please call for OV [id number] 05/13/19   Larey Dresser, MD  Simethicone (GAS-X PO) Take by mouth.    [provider]  Tafluprost (ZIOPTAN OP) Apply to eye. Left eye only for increased pressure    [provider]    Allergies    Amphetamine-dextroamphet er, Hydromorphone, Metronidazole, Penicillins, Thorazine [chlorpromazine], Bupivacaine, Fentanyl, Other, Atorvastatin, Azithromycin, Cephalexin, Chlorpromazine hcl, Ciprofloxacin, Doxycycline, Ezetimibe, Hyoscyamine sulfate, Lactose intolerance (gi), Lidocaine, Oatmeal, Sulfamethoxazole, Telithromycin, Topiramate, Lactose, and Latex  Review of Systems   Review of Systems  Gastrointestinal: Positive for abdominal pain.  All other systems reviewed and are negative.   Physical Exam Updated Vital Signs BP (!) 167/147   Pulse (!) 58   Temp 97.9 F (36.6 C) (Oral)   Resp (!) 22   SpO2 96%   Physical Exam Vitals and nursing note reviewed.   79 year old female, resting comfortably and in no acute distress. Vital signs are significant for elevated blood pressure and respiratory rate, borderline low heart rate. Oxygen saturation is 96%, which is normal. Head is normocephalic and atraumatic. PERRLA, EOMI. Oropharynx is clear. Neck is nontender and supple without  adenopathy or JVD. Back is nontender and there is no CVA tenderness. Lungs are clear without rales, wheezes, or rhonchi. Chest is nontender. Heart has regular rate and rhythm without murmur. Abdomen is soft, flat, with mild epigastric and mild to moderate suprapubic tenderness.  There is no rebound or guarding.  There are no masses or hepatosplenomegaly and peristalsis is normoactive. Extremities have no cyanosis or edema, full range of motion is present. Skin is warm and dry without rash. Neurologic: Mental status is normal, cranial nerves are intact, there are no motor or sensory deficits.  ED Results / Procedures / Treatments   Labs (all labs ordered are listed, but only abnormal results are displayed) Labs Reviewed  COMPREHENSIVE METABOLIC PANEL - Abnormal; Notable for the following components:      Result Value   Sodium 134 (*)    Glucose, Bld 154 (*)    All other components within normal limits  CBC WITH DIFFERENTIAL/PLATELET - Abnormal; Notable for the following components:   WBC 11.1 (*)    Neutro Abs 9.5 (*)    All other components within normal limits  LIPASE, BLOOD  URINALYSIS, ROUTINE W REFLEX MICROSCOPIC  TROPONIN I (HIGH SENSITIVITY)  TROPONIN I (HIGH SENSITIVITY)    EKG Sinus bradycardia 59 bpm, normal axis, normal intervals.  Nonspecific T wave flattening.  When compared with ECG 07/13/2019, PVCs are no longer present.  Radiology No results found.  Procedures Procedures   Medications Ordered in ED Medications  sodium chloride 0.9 % bolus 1,000 mL (has no administration in time range)  ondansetron (ZOFRAN) injection 4 mg (has no administration in time range)  morphine 4 MG/ML injection 4 mg (has no administration in time range)  sodium chloride flush (NS) 0.9 % injection 3 mL (3 mLs Intravenous Given 09/04/2019 0506)    ED Course  I have reviewed the triage vital signs and the nursing notes.  Pertinent labs & imaging results that were available during my  care of the patient were reviewed by me and considered in my medical decision making (see chart for details).  MDM Rules/Calculators/A&P Abdominal pain of uncertain cause.  Pattern is not suggestive of food poisoning.  Consider diverticulitis, pancreatitis.  Also, consider referred pain from ACS.  ECG shows no acute changes.  Old records are reviewed, and she did have an ED visit for abdominal pain 2013 which was secondary to diverticulitis.  Labs are unremarkable.  CT abdomen and pelvis is pending.  Case is signed out to Dr. Rex Kras.  Final Clinical Impression(s) / ED Diagnoses Final diagnoses:  Abdominal pain, unspecified abdominal location    Rx / DC Orders ED Discharge Orders    None       Delora Fuel, MD [id number] [phone]

## 2019-08-30 NOTE — ED Notes (Signed)
Transported to CT 

## 2019-08-30 NOTE — ED Notes (Signed)
Pt concerned that her NG tube has become dislodged, auscultated appropriately. Pt and daughter also requesting a CMP, Dr. Tamala Julian paged.

## 2019-08-31 ENCOUNTER — Other Ambulatory Visit: Payer: Self-pay

## 2019-08-31 ENCOUNTER — Inpatient Hospital Stay (HOSPITAL_COMMUNITY): Payer: Medicare Other

## 2019-08-31 LAB — CBC
HCT: 42.3 % (ref 36.0–46.0)
Hemoglobin: 14.3 g/dL (ref 12.0–15.0)
MCH: 34 pg (ref 26.0–34.0)
MCHC: 33.8 g/dL (ref 30.0–36.0)
MCV: 100.7 fL — ABNORMAL HIGH (ref 80.0–100.0)
Platelets: 249 10*3/uL (ref 150–400)
RBC: 4.2 MIL/uL (ref 3.87–5.11)
RDW: 12.3 % (ref 11.5–15.5)
WBC: 11.1 10*3/uL — ABNORMAL HIGH (ref 4.0–10.5)
nRBC: 0 % (ref 0.0–0.2)

## 2019-08-31 LAB — BASIC METABOLIC PANEL
Anion gap: 11 (ref 5–15)
BUN: 17 mg/dL (ref 8–23)
CO2: 23 mmol/L (ref 22–32)
Calcium: 9.1 mg/dL (ref 8.9–10.3)
Chloride: 101 mmol/L (ref 98–111)
Creatinine, Ser: 0.63 mg/dL (ref 0.44–1.00)
GFR calc Af Amer: >60 mL/min (ref >60)
GFR calc non Af Amer: >60 mL/min (ref >60)
Glucose, Bld: 143 mg/dL — ABNORMAL HIGH (ref 70–99)
Potassium: 3.8 mmol/L (ref 3.5–5.1)
Sodium: 135 mmol/L (ref 135–145)

## 2019-08-31 MED ORDER — LACTATED RINGERS IV SOLN: INTRAVENOUS | Status: DC

## 2019-08-31 MED ORDER — PROCHLORPERAZINE EDISYLATE 10 MG/2ML IJ SOLN
10.0000 mg | Freq: Four times a day (QID) | INTRAMUSCULAR | Status: DC | PRN
  Administered 2019-08-31: 10 mg via INTRAVENOUS
  Filled 2019-08-31 (×2): qty 2

## 2019-08-31 MED ORDER — TAFLUPROST (PF) 0.0015 % OP SOLN
1.0000 [drp] | Freq: Every day | OPHTHALMIC | Status: DC
  Administered 2019-08-31: 1 [drp] via OPHTHALMIC
  Filled 2019-08-31: qty 1

## 2019-08-31 MED ORDER — ONDANSETRON HCL 4 MG/2ML IJ SOLN
4.0000 mg | Freq: Four times a day (QID) | INTRAMUSCULAR | Status: DC | PRN
  Administered 2019-08-31: 4 mg via INTRAVENOUS
  Filled 2019-08-31: qty 2

## 2019-08-31 NOTE — Progress Notes (Signed)
PROGRESS NOTE  [name] [id number] DOB: [birth year] DOA: 09/04/2019 PCP: Binnie Rail, MD  Brief History   [name] is a 79 y.o. female with medical history significant of HLD, nonobstructive CAD, IBS, depression, and fibromyalgia presents with complaints of abdominal pain and belching.  Symptoms started last night and she was about to finish eating shrimp and potato salad.  Pain was located in the lower part of her abdomen.  Thought possibly symptoms secondary to acid reflux or food was bad.  She had tried drinking water, ginger ale, Gaviscon, Pepcid, and even a suppository.  Gaviscon helped settle her stomach only temporarily and belching also provided temporary relief.  After the suppository she had 2 very small stools.  The abdominal pain waxed and waned in intensity.  Other associated symptoms included nausea and vomited at least once.  Denies recently passing flatus, fever, shortness of breath, leg swelling, diarrhea, or dysuria.  She has never had symptoms like this before in the past.  Patient does report history of a ventral hernia repair, partial hysterectomy, and partial colectomy due to recurrent diverticulitis.  ED Course: Patient into the emergency department patient was noted to be afebrile, pulse 58-68, respirations 13-25, blood pressure is elevated up to 188/68, and O2 saturations maintained on room air.  Labs significant for WBC 11.1.  CT scan of the abdomen and pelvis revealed signs of a mid small bowel obstruction with 2 transition zones.  General surgery was consulted.  NG tube was ordered.  Patient was given 1 L normal saline IV fluids, morphine for pain, and antiemetics.  TRH called to admit.  Consultants  . General surgery  Procedures  . NGT  Antibiotics   Anti-infectives (From admission, onward)   None    .  Subjective  The patient is resting comfortably. She has no new complaints. I liter of efflluent from NG tube.  Objective   Vitals:  Vitals:    08/31/19 0451 08/31/19 0935  BP: (!) 159/71 (!) 161/70  Pulse: 69 70  Resp: 20 18  Temp: 98.6 F (37 C) 99.8 F (37.7 C)  SpO2: 97% 99%   Exam:  Constitutional:  . The patient is awake, alert, and oriented x 3. No acute distress. Respiratory:  . No increased work of breathing. . No wheezes, rales, or rhonchi . No tactile fremitus Cardiovascular:  . Regular rate and rhythm . No murmurs, ectopy, or gallups. . No lateral PMI. No thrills. Abdomen:  . Abdomen is soft, non-tender, non-distended . No hernias, masses, or organomegaly . Hypoactive bowel sounds. Musculoskeletal:  . No cyanosis, clubbing, or edema Skin:  . No rashes, lesions, ulcers . palpation of skin: no induration or nodules Neurologic:  . CN 2-12 intact . Sensation all 4 extremities intact Psychiatric:  . Mental status o Mood, affect appropriate o Orientation to person, place, time  . judgment and insight appear intact  I have personally reviewed the following:   Today's Data  . Vitals, BMP, CBC  Imaging  . CT abdomen: SBO with transition zones.  Scheduled Meds: . cloNIDine  0.1 mg Transdermal Weekly  . enoxaparin (LOVENOX) injection  40 mg Subcutaneous Q24H  . pantoprazole (PROTONIX) IV  40 mg Intravenous Q24H  . sodium chloride flush  3 mL Intravenous Q12H  . [START ON 09/01/2019] Tafluprost (PF)  1 drop Both Eyes Daily   Continuous Infusions: . lactated ringers 75 mL/hr at 08/31/19 1019  . magnesium sulfate bolus IVPB  Principal Problem:   SBO (small bowel obstruction) (HCC) Active Problems:   GERD   Hypertensive urgency   Leukocytosis   LOS: 1 day   A & P  Small bowel obstruction: Acute.  Presents with complaints of nausea, vomiting, and abdominal pain after eating shrimp salad.  CT of the abdomen and pelvis significant for mid small bowel obstruction.  Patient with previous history of multiple abdominal surgeries including partial colectomy due to recurrent diverticulitis, partial  hysterectomy, and ventral hernia repair.  Suspect symptoms secondary to adhesions.  General surgery was formally consulted. NGT has been placed. She has had about 1 L of brown/red fluid out of NGT. Pain and nausea are improving. Bowel sounds remain hypoactive. Patient has been continued on LR. Serial abdominal films per surgery. I appreciate their help.   Leukocytosis: Acute.  WBC elevated at 11.1 on admission.  Suspect this is reactive in nature to above. Monitor.  Hypertensive urgency: Acute.  On admission blood pressures elevated up to 188/68.  Home medications include clonidine 0.1 mg daily. Change to clonidine patch while n.p.o. Hydralazine IV is available on an as needed basis.  GERD: Pharmacy substitution of Protonix IV for oral PPI.  This patient has been seen and examined myself. I have spent 34 minutes in her evaluation and care.  DVT prophylaxis: Lovenox Code Status: Full Family Communication: None available. Disposition Plan: The patient is from home. Anticipate discharge to home when medically clear. Barriers to discharge include SBO, NGT and NPO status.   [name], DO [facility] Direct contact: see [email or web address]  7PM-7AM contact night coverage as above 08/31/2019, 1:42 PM  LOS: 1 day

## 2019-08-31 NOTE — Progress Notes (Signed)
[facility] Progress Note     Subjective: CC:  Denies abd pain. Denies flatus or BM. Belching during my exam  Reports NGT had to be re-secured overnight. AFVSS, HTN improved, WBC 11 from 11 (stable) Objective: Vital signs in last 24 hours: Temp:  [98.6 F (37 C)-99.3 F (37.4 C)] 98.6 F (37 C) (05/25 0451) Pulse Rate:  [52-74] 69 (05/25 0451) Resp:  [13-25] 20 (05/25 0451) BP: (129-200)/(54-95) 159/71 (05/25 0451) SpO2:  [97 %-100 %] 97 % (05/25 0451) Weight:  [58.4 kg] 58.4 kg (05/24 2133) Last BM Date: 08/29/19  Intake/Output from previous day: 05/24 0701 - 05/25 0700 In: 0  Out: 700 [Emesis/NG output:700] Intake/Output this shift: No intake/output data recorded.  PE: Gen:  Alert, NAD, pleasant Card:  Regular rate and rhythm, pedal pulses 2+ BL Pulm:  Normal effort, clear to auscultation bilaterally Abd: Soft, non-tender, mild distention, bowel sounds present  NG - 700 cc/24h brown, maybe a tinge of red  Skin: warm and dry, no rashes  Psych: A&Ox3   Lab Results:  Recent Labs    08/08/2019 0507 08/31/19 0539  WBC 11.1* 11.1*  HGB 13.4 14.3  HCT 39.5 42.3  PLT 187 249   BMET Recent Labs    08/17/2019 1733 08/31/19 0539  NA 133* 135  K 3.9 3.8  CL 100 101  CO2 20* 23  GLUCOSE 144* 143*  BUN 10 17  CREATININE 0.78 0.63  CALCIUM 9.4 9.1   PT/INR No results for input(s): LABPROT, INR in the last 72 hours. CMP     Component Value Date/Time   NA 135 08/31/2019 0539   K 3.8 08/31/2019 0539   CL 101 08/31/2019 0539   CO2 23 08/31/2019 0539   GLUCOSE 143 (H) 08/31/2019 0539   BUN 17 08/31/2019 0539   CREATININE 0.63 08/31/2019 0539   CALCIUM 9.1 08/31/2019 0539   PROT 7.4 08/20/2019 1733   ALBUMIN 4.3 08/24/2019 1733   AST 31 09/01/2019 1733   ALT 16 08/20/2019 1733   ALKPHOS 71 08/12/2019 1733   BILITOT 1.1 09/01/2019 1733   GFRNONAA >60 08/31/2019 0539   GFRAA >60 08/31/2019 0539   Lipase     Component Value Date/Time   LIPASE  20 08/14/2019 0507       Studies/Results: CT ABDOMEN PELVIS W CONTRAST  Result Date: 08/16/2019 CLINICAL DATA:  Abdominal pain and belching since 1930 hours last night, pain waxing and waning currently 10/10, associated nausea and vomiting EXAM: CT ABDOMEN AND PELVIS WITH CONTRAST TECHNIQUE: Multidetector CT imaging of the abdomen and pelvis was performed using the standard protocol following bolus administration of intravenous contrast. Sagittal and coronal MPR images reconstructed from axial data set. CONTRAST:  170mL OMNIPAQUE IOHEXOL 300 MG/ML SOLN IV. No oral contrast. COMPARISON:  11/08/2011 FINDINGS: Lower chest: Lung bases clear Hepatobiliary: Gallbladder surgically absent. Chronic low-attenuation focus lateral segment LEFT lobe liver unchanged, nonspecific. Liver otherwise unremarkable. Pancreas: Normal appearance Spleen: Normal appearance Adrenals/Urinary Tract: Small peripelvic cyst LEFT kidney. Cortical scar upper pole RIGHT kidney. Adrenal glands, kidneys, ureters, and bladder otherwise normal appearance. Stomach/Bowel: Appendix not visualized. Redundant cecum. Diverticulosis of descending and sigmoid colon without evidence of diverticulitis. Distal sigmoid anastomosis. Stomach unremarkable. Dilated proximal and decompressed distal small bowel loops consistent with small bowel obstruction. Associated edema of mesentery involving small bowel loops in pelvis. Minimal scattered wall thickening of small bowel loops in pelvis. Two potential transition zones of small bowel loops in pelvis, associated with the segments of mesenteric  edema, uncertain if represent adhesions or potentially closed loop obstruction. Vascular/Lymphatic: Atherosclerotic calcifications aorta and iliac arteries without aneurysm. No adenopathy. Reproductive: Uterus surgically absent with nonvisualization of ovaries. Multiple calcifications seen inferior to the urinary bladder within the perineum, nonspecific. Other: Minimal  ascites.  No free air.  No hernia. Musculoskeletal: Bones demineralized. IMPRESSION: Mid small bowel obstruction. Two potential areas of transition zone are seen in the pelvis, associated with the small-bowel segments with mesenteric edema uncertain if represent adhesions or potentially closed loop obstruction. No evidence of perforation. Nonspecific calcifications inferior to the urinary bladder within the perineum. Electronically Signed   By: Lavonia Dana M.D.   On: 09/02/2019 08:27   DG CHEST PORT 1 VIEW  Result Date: 08/07/2019 CLINICAL DATA:  Left-sided chest discomfort, shortness of breath, belching EXAM: PORTABLE CHEST 1 VIEW COMPARISON:  07/13/2019 FINDINGS: Single frontal view of the chest demonstrates enteric catheter passing below diaphragm tip excluded by collimation. Side port projects over the gastric fundus. The cardiac silhouette is unremarkable. No airspace disease, effusion, or pneumothorax. No acute bony abnormalities. IMPRESSION: 1. No acute intrathoracic process. Electronically Signed   By: Randa Ngo M.D.   On: 08/26/2019 17:10   DG Abd Portable 1V-Small Bowel Obstruction Protocol-initial, 8 hr delay  Result Date: 08/29/2019 CLINICAL DATA:  Encounter for small bowel obstruction. EXAM: PORTABLE ABDOMEN - 1 VIEW COMPARISON:  Abdominal radiograph and CT earlier this day. FINDINGS: Tip and side port of the enteric tube below the diaphragm in the stomach. There is excreted IV contrast within the urinary bladder from prior CT. The fluid-filled small bowel dilatation on CT are not appreciated by radiograph, no significant gaseous small bowel distension. In air-filled loop of bowel in the central abdomen appears to represent redundant cecum on prior CT. Small volume of stool in the colon. No evidence of free air. Lung bases are clear. IMPRESSION: 1. The fluid-filled small bowel dilatation on CT is not appreciated by radiograph. No significant gaseous small bowel distension. 2. Enteric tube  tip and side port of the enteric tube below the diaphragm in the stomach. 3. Excreted IV contrast in the urinary bladder from CT earlier this day. Electronically Signed   By: Keith Rake M.D.   On: 08/29/2019 20:37   DG Abd Portable 1V-Small Bowel Protocol-Position Verification  Result Date: 08/27/2019 CLINICAL DATA:  Enteric tube placement EXAM: PORTABLE ABDOMEN - 1 VIEW COMPARISON:  CT abdomen/pelvis from earlier today FINDINGS: Enteric tube terminates in the proximal stomach. Mildly dilated small bowel loops in the visualized upper abdomen, unchanged. No evidence of pneumatosis or pneumoperitoneum in the upper abdomen. Clear lung bases. IMPRESSION: 1. Enteric tube terminates in the proximal stomach. 2. Mildly dilated small bowel loops in the visualized upper abdomen, unchanged, compatible with known small bowel obstruction. Electronically Signed   By: Ilona Sorrel M.D.   On: 08/09/2019 10:58    Anti-infectives: Anti-infectives (From admission, onward)   None      Assessment/Plan HTN HLD CAD, [place] 1995 GERD Hypertensive urgency - per primary  SBO, likely secondary to adhesions - CT with two possible tranzition points in mid abdomen; WBC 11.1 and benign abdominal exam. Low suspicion for closed loop - NG 700 cc/24h - continue NG tube to LIWS and await return of bowel function - continue PPI - follow up KUB from this AM to see if contrast has progressed to the colon.   ID - none VTE - SCDs, lovenox FEN - IVF, NPO/NGT to LIWS Foley - none Follow  up - TBD   LOS: 1 day   Obie Dredge, [facility] Please see Amion for pager number during day hours 7:00am-4:30pm

## 2019-08-31 NOTE — Progress Notes (Signed)
Initial Nutrition Assessment  DOCUMENTATION CODES:   Not applicable  INTERVENTION:  Follow for diet advancement and order supplements as appropriate.    NUTRITION DIAGNOSIS:   Inadequate oral intake related to inability to eat as evidenced by NPO status.    GOAL:   Patient will meet greater than or equal to 90% of their needs    MONITOR:   Diet advancement, Skin, Weight trends, Labs, I & O's  REASON FOR ASSESSMENT:   Malnutrition Screening Tool    ASSESSMENT:   Pt with a PMH significant for HLD, nonobstructive CAD, IBS, depression, fibromyalgia, h/o ventral hernia repair, and h/o partial colectomy d/t recurrent diverticulitis presented with abdominal pain and was admitted with SBO.  Pt reports no nausea/abdominal pain today. PTA, pt states she ate 3 balanced meals per day -- cheese toast and bacon for breakfast, salad with protein for lunch, and a protein, carb and vegetable for dinner. Pt denies wt loss PTA.   NGT to suction -- 784ml output x24 hours.  Labs and medications reviewed.   NUTRITION - FOCUSED PHYSICAL EXAM:    Most Recent Value  Orbital Region  No depletion  Upper Arm Region  No depletion  Thoracic and Lumbar Region  No depletion  Buccal Region  No depletion  Temple Region  No depletion  Clavicle Bone Region  No depletion  Clavicle and Acromion Bone Region  No depletion  Scapular Bone Region  No depletion  Dorsal Hand  No depletion  Patellar Region  No depletion  Anterior Thigh Region  No depletion  Posterior Calf Region  No depletion  Edema (RD Assessment)  None  Hair  Reviewed  Eyes  Reviewed  Mouth  Reviewed  Skin  Reviewed  Nails  Reviewed       Diet Order:   Diet Order            Diet NPO time specified  Diet effective now              EDUCATION NEEDS:   No education needs have been identified at this time  Skin:  Skin Assessment: Skin Integrity Issues: Skin Integrity Issues:: Stage I Stage I: bilateral  buttocks  Last BM:  5/23  Height:   Ht Readings from Last 1 Encounters:  08/31/19 5\' 1"  (1.549 m)    Weight:   Wt Readings from Last 5 Encounters:  08/24/2019 58.4 kg  07/13/19 57.2 kg  10/23/18 56.4 kg  05/12/18 59.7 kg  08/14/17 59.9 kg    BMI:  Body mass index is 24.32 kg/m.  Estimated Nutritional Needs:   Kcal:  1300-1500  Protein:  65-80 grams  Fluid:  >/= 1.3 L/d    Larkin Ina, MS, RD, LDN RD pager number and weekend/on-call pager number located in [place].

## 2019-09-01 ENCOUNTER — Inpatient Hospital Stay (HOSPITAL_COMMUNITY): Payer: Medicare Other

## 2019-09-01 DIAGNOSIS — R109 Unspecified abdominal pain: Secondary | ICD-10-CM

## 2019-09-01 DIAGNOSIS — Z0189 Encounter for other specified special examinations: Secondary | ICD-10-CM

## 2019-09-01 LAB — CBC WITH DIFFERENTIAL/PLATELET
Abs Immature Granulocytes: 0.02 10*3/uL (ref 0.00–0.07)
Basophils Absolute: 0 10*3/uL (ref 0.0–0.1)
Basophils Relative: 0 %
Eosinophils Absolute: 0 10*3/uL (ref 0.0–0.5)
Eosinophils Relative: 0 %
HCT: 41.6 % (ref 36.0–46.0)
Hemoglobin: 13.9 g/dL (ref 12.0–15.0)
Immature Granulocytes: 0 %
Lymphocytes Relative: 9 %
Lymphs Abs: 0.6 10*3/uL — ABNORMAL LOW (ref 0.7–4.0)
MCH: 34.2 pg — ABNORMAL HIGH (ref 26.0–34.0)
MCHC: 33.4 g/dL (ref 30.0–36.0)
MCV: 102.5 fL — ABNORMAL HIGH (ref 80.0–100.0)
Monocytes Absolute: 1.1 10*3/uL — ABNORMAL HIGH (ref 0.1–1.0)
Monocytes Relative: 16 %
Neutro Abs: 5.1 10*3/uL (ref 1.7–7.7)
Neutrophils Relative %: 75 %
Platelets: 215 10*3/uL (ref 150–400)
RBC: 4.06 MIL/uL (ref 3.87–5.11)
RDW: 12.4 % (ref 11.5–15.5)
WBC: 6.9 10*3/uL (ref 4.0–10.5)
nRBC: 0 % (ref 0.0–0.2)

## 2019-09-01 LAB — BASIC METABOLIC PANEL
Anion gap: 11 (ref 5–15)
BUN: 21 mg/dL (ref 8–23)
CO2: 25 mmol/L (ref 22–32)
Calcium: 8.7 mg/dL — ABNORMAL LOW (ref 8.9–10.3)
Chloride: 101 mmol/L (ref 98–111)
Creatinine, Ser: 0.58 mg/dL (ref 0.44–1.00)
GFR calc Af Amer: >60 mL/min (ref >60)
GFR calc non Af Amer: >60 mL/min (ref >60)
Glucose, Bld: 133 mg/dL — ABNORMAL HIGH (ref 70–99)
Potassium: 3.2 mmol/L — ABNORMAL LOW (ref 3.5–5.1)
Sodium: 137 mmol/L (ref 135–145)

## 2019-09-01 LAB — MAGNESIUM: Magnesium: 2.1 mg/dL (ref 1.7–2.4)

## 2019-09-01 MED ORDER — POTASSIUM CHLORIDE 10 MEQ/100ML IV SOLN
10.0000 meq | INTRAVENOUS | Status: AC
  Administered 2019-09-01 (×5): 10 meq via INTRAVENOUS
  Filled 2019-09-01 (×4): qty 100

## 2019-09-01 MED ORDER — TAFLUPROST (PF) 0.0015 % OP SOLN
1.0000 [drp] | Freq: Every day | OPHTHALMIC | Status: DC
  Administered 2019-09-01 – 2019-09-05 (×5): 1 [drp] via OPHTHALMIC
  Filled 2019-09-01 (×8): qty 1

## 2019-09-01 MED ORDER — KCL IN DEXTROSE-NACL 20-5-0.45 MEQ/L-%-% IV SOLN
INTRAVENOUS | Status: DC
  Filled 2019-09-01 (×2): qty 1000

## 2019-09-01 MED ORDER — KETOROLAC TROMETHAMINE 15 MG/ML IJ SOLN
15.0000 mg | Freq: Four times a day (QID) | INTRAMUSCULAR | Status: DC | PRN
  Administered 2019-09-01 – 2019-09-03 (×3): 15 mg via INTRAVENOUS
  Filled 2019-09-01 (×5): qty 1

## 2019-09-01 NOTE — Progress Notes (Signed)
PROGRESS NOTE  [name] [id number] DOB: 05/08/1940 DOA: 08/25/2019 PCP: Binnie Rail, MD   LOS: 2 days   Brief Narrative / Interim history: 79 year old female with history of nonobstructive CAD, HLD, IBS, depression, came into the hospital with abdominal pain.  She was admitted on 5/24.  Imaging in the ED showed small bowel obstruction (CT showed mid small bowel obstruction with a transition zones), she got an NG tube placed and general surgery was consulted.  Subjective / 24h Interval events: She is feeling about the same today, no nausea or vomiting.  NG tube is in place.  She is passing gas but has not had a bowel movement  Assessment & Plan: Principal Problem Acute small bowel obstruction-CT scan with mid small bowel obstruction with transition point.  General surgery consulted and following, discussed with Obie Dredge PA, this morning, will do a trial of clamping and see how she does  Active Problems Hypertensive urgency-blood pressure on admission up into the 180s.  Resume clonidine patch, blood pressure overall stable.  Will resume oral meds once able  GERD-continue PPI   Scheduled Meds: . cloNIDine  0.1 mg Transdermal Weekly  . enoxaparin (LOVENOX) injection  40 mg Subcutaneous Q24H  . pantoprazole (PROTONIX) IV  40 mg Intravenous Q24H  . sodium chloride flush  3 mL Intravenous Q12H  . Tafluprost (PF)  1 drop Both Eyes Daily   Continuous Infusions: . dextrose 5 % and 0.45 % NaCl with KCl 20 mEq/L 75 mL/hr at 09/01/19 0843  . magnesium sulfate bolus IVPB    . potassium chloride 10 mEq (09/01/19 0955)   PRN Meds:.acetaminophen **OR** acetaminophen, diphenhydrAMINE, fluticasone, hydrALAZINE, ondansetron (ZOFRAN) IV, prochlorperazine  DVT prophylaxis: Lovenox Code Status: Full code Family Communication: no family at bedside   Status is: Inpatient  Remains inpatient appropriate because:IV treatments appropriate due to intensity of illness or inability to  take PO  Dispo: The patient is from: Home              Anticipated d/c is to: Home              Anticipated d/c date is: 3 days              Patient currently is not medically stable to d/c.  Consultants:  General surgery   Procedures:  None   Microbiology  None   Antimicrobials: None     Objective: Vitals:   08/31/19 2114 09/01/19 0442 09/01/19 0444 09/01/19 0908  BP: (!) 184/72 (!) 175/79  (!) 162/63  Pulse: (!) 57 90 77 66  Resp: 18 18  18   Temp: 99.2 F (37.3 C) 98.2 F (36.8 C)  98.2 F (36.8 C)  TempSrc: Oral Oral  Oral  SpO2: 96% 92% 96% 98%  Weight: 56.4 kg     Height:        Intake/Output Summary (Last 24 hours) at 09/01/2019 1006 Last data filed at 09/01/2019 0602 Gross per 24 hour  Intake 1448.74 ml  Output 500 ml  Net 948.74 ml   Filed Weights   08/20/2019 2133 08/31/19 2114  Weight: 58.4 kg 56.4 kg    Examination:  Constitutional: NAD Eyes: no scleral icterus ENMT: Mucous membranes are moist.  Neck: normal, supple Respiratory: clear to auscultation bilaterally, no wheezing, no crackles. Normal respiratory effort.  Cardiovascular: Regular rate and rhythm, no murmurs / rubs / gallops. No LE edema. Abdomen: non distended, no tenderness. Bowel sounds diminished but present Musculoskeletal: no clubbing / cyanosis.  Skin: no rashes appreciated Neurologic: non focal   Data Reviewed: I have independently reviewed following labs and imaging studies   CBC: Recent Labs  Lab 08/23/2019 0507 08/31/19 0539 09/01/19 0453  WBC 11.1* 11.1* 6.9  NEUTROABS 9.5*  --  5.1  HGB 13.4 14.3 13.9  HCT 39.5 42.3 41.6  MCV 99.7 100.7* 102.5*  PLT 187 249 [id number]   Basic Metabolic Panel: Recent Labs  Lab 08/19/2019 0507 08/28/2019 1733 08/31/19 0539 09/01/19 0453  NA 134* 133* 135 137  K 3.5 3.9 3.8 3.2*  CL 100 100 101 101  CO2 23 20* 23 25  GLUCOSE 154* 144* 143* 133*  BUN 14 10 17 21   CREATININE 0.75 0.78 0.63 0.58  CALCIUM 9.5 9.4 9.1 8.7*   Liver  Function Tests: Recent Labs  Lab 08/26/2019 0507 08/10/2019 1733  AST 31 31  ALT 16 16  ALKPHOS 59 71  BILITOT 0.9 1.1  PROT 6.8 7.4  ALBUMIN 4.0 4.3   Coagulation Profile: No results for input(s): INR, PROTIME in the last 168 hours. HbA1C: No results for input(s): HGBA1C in the last 72 hours. CBG: No results for input(s): GLUCAP in the last 168 hours.  Recent Results (from the past 240 hour(s))  SARS Coronavirus 2 by RT PCR (hospital order, performed in [facility] hospital lab) Nasopharyngeal Nasopharyngeal Swab     Status: None   Collection Time: 09/04/2019  8:59 AM   Specimen: Nasopharyngeal Swab  Result Value Ref Range Status   SARS Coronavirus 2 NEGATIVE NEGATIVE Final    Comment: (NOTE) SARS-CoV-2 target nucleic acids are NOT DETECTED. The SARS-CoV-2 RNA is generally detectable in upper and lower respiratory specimens during the acute phase of infection. The lowest concentration of SARS-CoV-2 viral copies this assay can detect is 250 copies / mL. A negative result does not preclude SARS-CoV-2 infection and should not be used as the sole basis for treatment or other patient management decisions.  A negative result may occur with improper specimen collection / handling, submission of specimen other than nasopharyngeal swab, presence of viral mutation(s) within the areas targeted by this assay, and inadequate number of viral copies (<250 copies / mL). A negative result must be combined with clinical observations, patient history, and epidemiological information. Fact Sheet for Patients:   StrictlyIdeas.no Fact Sheet for Healthcare Providers: BankingDealers.co.za This test is not yet approved or cleared  by the [place] FDA and has been authorized for detection and/or diagnosis of SARS-CoV-2 by FDA under an Emergency Use Authorization (EUA).  This EUA will remain in effect (meaning this test can be used) for the duration of  the COVID-19 declaration under Section 564(b)(1) of the Act, 21 U.S.C. section 360bbb-3(b)(1), unless the authorization is terminated or revoked sooner. Performed at [facility], [place] [address], [place], [place] [address]      Radiology Studies: DG Abd Portable 1V  Result Date: 09/01/2019 CLINICAL DATA:  Follow-up small bowel obstruction. EXAM: PORTABLE ABDOMEN - 1 VIEW COMPARISON:  08/31/2019 FINDINGS: Nasogastric tube tip and side hole in the proximal stomach. Increased size and number of multiple dilated loops of jejunum. There is some stool and gas in normal caliber colon. Lumbar and lower thoracic spine degenerative changes and mild scoliosis. Lower pelvic surgical clips. IMPRESSION: Worsening partial small-bowel obstruction. Electronically Signed   By: Claudie Revering M.D.   On: 09/01/2019 08:23    Marzetta Board, MD, PhD [facility]  Between 7 am - 7 pm I am available, please  contact me via Amion or Securechat  Between 7 pm - 7 am I am not available, please contact night coverage MD/APP via Amion

## 2019-09-01 NOTE — Progress Notes (Addendum)
[facility] Progress Note     Subjective: CC:  Denies pain. Small amt flatus. No BM. Mobilized yesterday in the hallway. States she is hungry.  Objective: Vital signs in last 24 hours: Temp:  [98.2 F (36.8 C)-99.8 F (37.7 C)] 98.2 F (36.8 C) (05/26 0442) Pulse Rate:  [57-90] 77 (05/26 0444) Resp:  [18-20] 18 (05/26 0442) BP: (161-184)/(69-79) 175/79 (05/26 0442) SpO2:  [92 %-99 %] 96 % (05/26 0444) Weight:  [56.4 kg] 56.4 kg (05/25 2114) Last BM Date: 08/18/2019  Intake/Output from previous day: 05/25 0701 - 05/26 0700 In: 1448.7 [I.V.:1448.7] Out: 500 [Urine:200; Emesis/NG output:300] Intake/Output this shift: No intake/output data recorded.  PE: Gen:  Alert, NAD, pleasant Card:  Regular rate and rhythm, pedal pulses 2+ BL Pulm:  Normal effort, clear to auscultation bilaterally Abd: Soft, non-tender, mild distention, bowel sounds present in all 4 quadrants, previous laparotomy scar appears well-healed  NG - 500-800 cc/24h clear/brown, less feculent/cloudy compared to yesterday Skin: warm and dry, no rashes  Psych: A&Ox3   Lab Results:  Recent Labs    08/31/19 0539 09/01/19 0453  WBC 11.1* 6.9  HGB 14.3 13.9  HCT 42.3 41.6  PLT 249 215   BMET Recent Labs    08/31/19 0539 09/01/19 0453  NA 135 137  K 3.8 3.2*  CL 101 101  CO2 23 25  GLUCOSE 143* 133*  BUN 17 21  CREATININE 0.63 0.58  CALCIUM 9.1 8.7*   PT/INR No results for input(s): LABPROT, INR in the last 72 hours. CMP     Component Value Date/Time   NA 137 09/01/2019 0453   K 3.2 (L) 09/01/2019 0453   CL 101 09/01/2019 0453   CO2 25 09/01/2019 0453   GLUCOSE 133 (H) 09/01/2019 0453   BUN 21 09/01/2019 0453   CREATININE 0.58 09/01/2019 0453   CALCIUM 8.7 (L) 09/01/2019 0453   PROT 7.4 08/13/2019 1733   ALBUMIN 4.3 08/09/2019 1733   AST 31 08/09/2019 1733   ALT 16 08/08/2019 1733   ALKPHOS 71 09/03/2019 1733   BILITOT 1.1 08/21/2019 1733   GFRNONAA >60 09/01/2019 0453    GFRAA >60 09/01/2019 0453   Lipase     Component Value Date/Time   LIPASE 20 09/03/2019 0507       Studies/Results: CT ABDOMEN PELVIS W CONTRAST  Result Date: 08/29/2019 CLINICAL DATA:  Abdominal pain and belching since 1930 hours last night, pain waxing and waning currently 10/10, associated nausea and vomiting EXAM: CT ABDOMEN AND PELVIS WITH CONTRAST TECHNIQUE: Multidetector CT imaging of the abdomen and pelvis was performed using the standard protocol following bolus administration of intravenous contrast. Sagittal and coronal MPR images reconstructed from axial data set. CONTRAST:  12mL OMNIPAQUE IOHEXOL 300 MG/ML SOLN IV. No oral contrast. COMPARISON:  11/08/2011 FINDINGS: Lower chest: Lung bases clear Hepatobiliary: Gallbladder surgically absent. Chronic low-attenuation focus lateral segment LEFT lobe liver unchanged, nonspecific. Liver otherwise unremarkable. Pancreas: Normal appearance Spleen: Normal appearance Adrenals/Urinary Tract: Small peripelvic cyst LEFT kidney. Cortical scar upper pole RIGHT kidney. Adrenal glands, kidneys, ureters, and bladder otherwise normal appearance. Stomach/Bowel: Appendix not visualized. Redundant cecum. Diverticulosis of descending and sigmoid colon without evidence of diverticulitis. Distal sigmoid anastomosis. Stomach unremarkable. Dilated proximal and decompressed distal small bowel loops consistent with small bowel obstruction. Associated edema of mesentery involving small bowel loops in pelvis. Minimal scattered wall thickening of small bowel loops in pelvis. Two potential transition zones of small bowel loops in pelvis, associated with the segments of  mesenteric edema, uncertain if represent adhesions or potentially closed loop obstruction. Vascular/Lymphatic: Atherosclerotic calcifications aorta and iliac arteries without aneurysm. No adenopathy. Reproductive: Uterus surgically absent with nonvisualization of ovaries. Multiple calcifications seen  inferior to the urinary bladder within the perineum, nonspecific. Other: Minimal ascites.  No free air.  No hernia. Musculoskeletal: Bones demineralized. IMPRESSION: Mid small bowel obstruction. Two potential areas of transition zone are seen in the pelvis, associated with the small-bowel segments with mesenteric edema uncertain if represent adhesions or potentially closed loop obstruction. No evidence of perforation. Nonspecific calcifications inferior to the urinary bladder within the perineum. Electronically Signed   By: Lavonia Dana M.D.   On: 08/17/2019 08:27   DG CHEST PORT 1 VIEW  Result Date: 09/06/2019 CLINICAL DATA:  Left-sided chest discomfort, shortness of breath, belching EXAM: PORTABLE CHEST 1 VIEW COMPARISON:  07/13/2019 FINDINGS: Single frontal view of the chest demonstrates enteric catheter passing below diaphragm tip excluded by collimation. Side port projects over the gastric fundus. The cardiac silhouette is unremarkable. No airspace disease, effusion, or pneumothorax. No acute bony abnormalities. IMPRESSION: 1. No acute intrathoracic process. Electronically Signed   By: Randa Ngo M.D.   On: 09/06/2019 17:10   DG Abd Portable 1V-Small Bowel Obstruction Protocol-24 hr delay  Result Date: 08/31/2019 CLINICAL DATA:  Small-bowel obstruction EXAM: PORTABLE ABDOMEN - 1 VIEW COMPARISON:  One-view abdomen 08/08/2019 FINDINGS: Side port of the enteric tube is in the stomach. Minimal dilation proximal small bowel continues to improve. Distal bowel gas scratched at the distal small bowel and colonic gas pattern are normal. Some contrast remains within the urinary bladder. Advanced degenerative changes of the spine are stable. IMPRESSION: 1. Continued improvement in small bowel obstruction. 2. Side port of the enteric tube in the stomach. Electronically Signed   By: San Morelle M.D.   On: 08/31/2019 08:32   DG Abd Portable 1V-Small Bowel Obstruction Protocol-initial, 8 hr  delay  Result Date: 08/18/2019 CLINICAL DATA:  Encounter for small bowel obstruction. EXAM: PORTABLE ABDOMEN - 1 VIEW COMPARISON:  Abdominal radiograph and CT earlier this day. FINDINGS: Tip and side port of the enteric tube below the diaphragm in the stomach. There is excreted IV contrast within the urinary bladder from prior CT. The fluid-filled small bowel dilatation on CT are not appreciated by radiograph, no significant gaseous small bowel distension. In air-filled loop of bowel in the central abdomen appears to represent redundant cecum on prior CT. Small volume of stool in the colon. No evidence of free air. Lung bases are clear. IMPRESSION: 1. The fluid-filled small bowel dilatation on CT is not appreciated by radiograph. No significant gaseous small bowel distension. 2. Enteric tube tip and side port of the enteric tube below the diaphragm in the stomach. 3. Excreted IV contrast in the urinary bladder from CT earlier this day. Electronically Signed   By: Keith Rake M.D.   On: 08/29/2019 20:37   DG Abd Portable 1V-Small Bowel Protocol-Position Verification  Result Date: 08/16/2019 CLINICAL DATA:  Enteric tube placement EXAM: PORTABLE ABDOMEN - 1 VIEW COMPARISON:  CT abdomen/pelvis from earlier today FINDINGS: Enteric tube terminates in the proximal stomach. Mildly dilated small bowel loops in the visualized upper abdomen, unchanged. No evidence of pneumatosis or pneumoperitoneum in the upper abdomen. Clear lung bases. IMPRESSION: 1. Enteric tube terminates in the proximal stomach. 2. Mildly dilated small bowel loops in the visualized upper abdomen, unchanged, compatible with known small bowel obstruction. Electronically Signed   By: Janina Mayo.D.  On: 08/14/2019 10:58    Anti-infectives: Anti-infectives (From admission, onward)   None      Assessment/Plan HTN HLD CAD, MI 1995 GERD Hypertensive urgency - per primary  SBO, likely secondary to adhesions - CT with two possible  tranzition points in mid abdomen; WBC 6.9 and non-tender abdominal exam. Low suspicion for closed loop - having some flatus, No BM - NG 500-800 cc/24h, more clear  - Patient with some small bowel dilation on KUB, some flatus, NG output is more clear/bilious than yesterday. She is making some clinical improvement but has not completely opened up. NG tube clamp trial today, clamp NG for 6 hours and then check residuals. Resume LIWS if patient develops abd pain, nausea, emesis.  - continue PPI  ID - none VTE - SCDs, lovenox FEN - IVF, NPO/NGT to LIWS Foley - none Follow up - TBD   LOS: 2 days   Obie Dredge, [facility] Please see Amion for pager number during day hours 7:00am-4:30pm

## 2019-09-02 ENCOUNTER — Inpatient Hospital Stay (HOSPITAL_COMMUNITY): Payer: Medicare Other

## 2019-09-02 DIAGNOSIS — R9431 Abnormal electrocardiogram [ECG] [EKG]: Secondary | ICD-10-CM

## 2019-09-02 LAB — COMPREHENSIVE METABOLIC PANEL WITH GFR
ALT: 17 U/L (ref 0–44)
AST: 23 U/L (ref 15–41)
Albumin: 3.2 g/dL — ABNORMAL LOW (ref 3.5–5.0)
Alkaline Phosphatase: 45 U/L (ref 38–126)
Anion gap: 11 (ref 5–15)
BUN: 23 mg/dL (ref 8–23)
CO2: 24 mmol/L (ref 22–32)
Calcium: 8.2 mg/dL — ABNORMAL LOW (ref 8.9–10.3)
Chloride: 97 mmol/L — ABNORMAL LOW (ref 98–111)
Creatinine, Ser: 0.54 mg/dL (ref 0.44–1.00)
GFR calc Af Amer: >60 mL/min
GFR calc non Af Amer: >60 mL/min
Glucose, Bld: 137 mg/dL — ABNORMAL HIGH (ref 70–99)
Potassium: 3.6 mmol/L (ref 3.5–5.1)
Sodium: 132 mmol/L — ABNORMAL LOW (ref 135–145)
Total Bilirubin: 0.9 mg/dL (ref 0.3–1.2)
Total Protein: 5.8 g/dL — ABNORMAL LOW (ref 6.5–8.1)

## 2019-09-02 LAB — CBC
HCT: 38.6 % (ref 36.0–46.0)
Hemoglobin: 12.6 g/dL (ref 12.0–15.0)
MCH: 33.6 pg (ref 26.0–34.0)
MCHC: 32.6 g/dL (ref 30.0–36.0)
MCV: 102.9 fL — ABNORMAL HIGH (ref 80.0–100.0)
Platelets: 182 K/uL (ref 150–400)
RBC: 3.75 MIL/uL — ABNORMAL LOW (ref 3.87–5.11)
RDW: 11.9 % (ref 11.5–15.5)
WBC: 2.9 K/uL — ABNORMAL LOW (ref 4.0–10.5)
nRBC: 0 % (ref 0.0–0.2)

## 2019-09-02 LAB — ECHOCARDIOGRAM COMPLETE
Height: 61 in
Weight: 1989.43 [oz_av]

## 2019-09-02 MED ORDER — MORPHINE SULFATE (PF) 2 MG/ML IV SOLN
1.0000 mg | INTRAVENOUS | Status: DC | PRN
  Filled 2019-09-02: qty 1

## 2019-09-02 MED ORDER — KETOROLAC TROMETHAMINE 15 MG/ML IJ SOLN
15.0000 mg | Freq: Once | INTRAMUSCULAR | Status: AC
  Administered 2019-09-02: 15 mg via INTRAVENOUS
  Filled 2019-09-02: qty 1

## 2019-09-02 MED ORDER — POTASSIUM CHLORIDE IN NACL 20-0.9 MEQ/L-% IV SOLN
INTRAVENOUS | Status: DC
  Filled 2019-09-02 (×3): qty 1000

## 2019-09-02 MED ORDER — CEFAZOLIN SODIUM-DEXTROSE 2-4 GM/100ML-% IV SOLN
2.0000 g | INTRAVENOUS | Status: AC
  Administered 2019-09-03: 2 g via INTRAVENOUS
  Filled 2019-09-02: qty 100

## 2019-09-02 MED ORDER — LORAZEPAM 2 MG/ML IJ SOLN
0.2500 mg | INTRAMUSCULAR | Status: DC | PRN
  Administered 2019-09-02: 0.25 mg via INTRAVENOUS
  Filled 2019-09-02: qty 1

## 2019-09-02 MED ORDER — PHENOL 1.4 % MT LIQD
1.0000 | OROMUCOSAL | Status: DC | PRN
  Administered 2019-09-02: 1 via OROMUCOSAL
  Filled 2019-09-02: qty 177

## 2019-09-02 NOTE — Progress Notes (Signed)
[facility] Progress Note     Subjective: CC:  Tolerated clamping trial initially but felt uncomfortable, increased abdominal pressure after 6 hours, followed by 850 cc NG output. She has stopping having flatus. No BM.  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  Objective: Vital signs in last 24 hours: Temp:  [98.2 F (36.8 C)-99.2 F (37.3 C)] 98.9 F (37.2 C) (05/27 0451) Pulse Rate:  [66-72] 72 (05/27 0451) Resp:  [18] 18 (05/27 0451) BP: (152-186)/(62-89) 152/89 (05/27 0451) SpO2:  [97 %-98 %] 97 % (05/27 0451) Last BM Date: 08/29/2019  Intake/Output from previous day: 05/26 0701 - 05/27 0700 In: 1455.9 [I.V.:1455.9] Out: 950 [Urine:100; Emesis/NG output:850] Intake/Output this shift: No intake/output data recorded.  PE: Gen:  Alert, NAD, pleasant Card:  Regular rate and rhythm, pedal pulses 2+ BL Pulm:  Normal effort, clear to auscultation bilaterally Abd: Soft, non-tender, distended, hypoactive bowel sounds, previous laparotomy scar noted   NG - 850 cc/24h brown Skin: warm and dry, no rashes  Psych: A&Ox3   Lab Results:  Recent Labs    09/01/19 0453 09/02/19 0317  WBC 6.9 2.9*  HGB 13.9 12.6  HCT 41.6 38.6  PLT 215 182   BMET Recent Labs    09/01/19 0453 09/02/19 0317  NA 137 132*  K 3.2* 3.6  CL 101 97*  CO2 25 24  GLUCOSE 133* 137*  BUN 21 23  CREATININE 0.58 0.54  CALCIUM 8.7* 8.2*   PT/INR No results for input(s): LABPROT, INR in the last 72 hours. CMP     Component Value Date/Time   NA 132 (L) 09/02/2019 0317   K 3.6 09/02/2019 0317   CL 97 (L) 09/02/2019 0317   CO2 24 09/02/2019 0317   GLUCOSE 137 (H) 09/02/2019 0317   BUN 23 09/02/2019 0317   CREATININE 0.54 09/02/2019 0317   CALCIUM 8.2 (L) 09/02/2019 0317   PROT 5.8 (L) 09/02/2019 0317   ALBUMIN 3.2 (L) 09/02/2019 0317   AST 23 09/02/2019 0317   ALT 17 09/02/2019 0317   ALKPHOS 45 09/02/2019 0317   BILITOT 0.9 09/02/2019 0317    GFRNONAA >60 09/02/2019 0317   GFRAA >60 09/02/2019 0317   Lipase     Component Value Date/Time   LIPASE 20 08/26/2019 0507       Studies/Results: DG Abd Portable 1V  Result Date: 09/02/2019 CLINICAL DATA:  Small-bowel obstruction EXAM: PORTABLE ABDOMEN - 1 VIEW COMPARISON:  Portable exam 0813 hours compared to 09/01/2019 FINDINGS: Persistent gaseous distension of small bowel loops. Small amount of gas and stool in RIGHT colon. Findings remain consistent with small bowel obstruction. No definite bowel wall thickening. Tip of nasogastric tube projects over stomach. Degenerative disc and facet disease changes at lower lumbar spine with osseous demineralization. IMPRESSION: Persistent small bowel obstruction. Electronically Signed   By: Lavonia Dana M.D.   On: 09/02/2019 08:50   DG Abd Portable 1V  Result Date: 09/01/2019 CLINICAL DATA:  Follow-up small bowel obstruction. EXAM: PORTABLE ABDOMEN - 1 VIEW COMPARISON:  08/31/2019 FINDINGS: Nasogastric tube tip and side hole in the proximal stomach. Increased size and number of multiple dilated loops of jejunum. There is some stool and gas in normal caliber colon. Lumbar and lower thoracic spine degenerative changes and mild scoliosis. Lower pelvic surgical clips. IMPRESSION: Worsening partial small-bowel obstruction. Electronically Signed   By: Claudie Revering M.D.   On: 09/01/2019 08:23    Anti-infectives: Anti-infectives (From admission, onward)  None      Assessment/Plan HTN HLD CAD, [place] 1995 GERD Hypertensive urgency - per primary  SBO, likely secondary to adhesions - CT 5/24 with two possible tranzition points in mid abdomen; WBC 6.9 and non-tender abdominal exam. Low suspicion for closed loop - persistently dilated loops of small bowel on KUB, not clinically improving (no flatus/no BM) -  Per chart review in care everywhere patient underwent elective, open ventral hernia repair with mesh in 07/2015 at [place] that was  complicated by profound hyponatremia with AMS requiring a short ICU admission. Other past surgeries include bladder suspension surgery and laparoscopic sigmoid colectomy (2013, Dr. Dalbert Batman).  - I am concerned this patient will require exploratory laparotomy for SBO not resolving with non-operative management. Will discuss with MD. Echo pending.   ID - none VTE - SCDs, lovenox FEN - IVF, NPO/NGT to LIWS Foley - none Follow up - TBD     LOS: 3 days   Obie Dredge, [facility] Please see Amion for pager number during day hours 7:00am-4:30pm

## 2019-09-02 NOTE — Progress Notes (Signed)
PROGRESS NOTE  [name] [id number] DOB: [birth year] DOA: 09/01/2019 PCP: Binnie Rail, MD   LOS: 3 days   Brief Narrative / Interim history: 79 year old female with history of nonobstructive CAD, HLD, IBS, depression, came into the hospital with abdominal pain.  She was admitted on 5/24.  Imaging in the ED showed small bowel obstruction (CT showed mid small bowel obstruction with a transition zones), she got an NG tube placed and general surgery was consulted. She was treated conservatively but it appears that she is not improving on plans are in place for operative exploration.  Subjective / 24h Interval events: No nausea or vomiting this morning. Could not tolerate clamping of the NG tube yesterday and had to be placed back to wall suction  Assessment & Plan: Principal Problem Acute small bowel obstruction-CT scan with mid small bowel obstruction with transition point.  General surgery consulted and following, she does not seem to be improving with conservative management and will likely need operative repair -In terms of preop assessment, patient denies any prior cardiac history. She denies any anginal type symptoms or shortness of breath with exertion. She is walking every day without issues. On chart review, she did have an episode of shortness of breath few months ago for which she was seen as an outpatient by her PCP, patient believed that it appeared after starting Diamox -Obtain a baseline EKG as well as a 2D echo. If the 2D echo shows normal EF without any WMA she should be okay for surgery. Her Lyndel Safe perioperative risk is 0.3%  Active Problems Hypertensive urgency-blood pressure on admission up into the 180s.  Resume clonidine patch, blood pressure overall stable. Continue IV as needed's  GERD-continue PPI   Scheduled Meds: . cloNIDine  0.1 mg Transdermal Weekly  . enoxaparin (LOVENOX) injection  40 mg Subcutaneous Q24H  . pantoprazole (PROTONIX) IV  40 mg Intravenous  Q24H  . sodium chloride flush  3 mL Intravenous Q12H  . Tafluprost (PF)  1 drop Both Eyes Daily   Continuous Infusions: . 0.9 % NaCl with KCl 20 mEq / L    . magnesium sulfate bolus IVPB     PRN Meds:.acetaminophen **OR** acetaminophen, diphenhydrAMINE, fluticasone, hydrALAZINE, ketorolac, ondansetron (ZOFRAN) IV, prochlorperazine  DVT prophylaxis: Lovenox Code Status: Full code Family Communication: no family at bedside   Status is: Inpatient  Remains inpatient appropriate because:IV treatments appropriate due to intensity of illness or inability to take PO  Dispo: The patient is from: Home              Anticipated d/c is to: Home              Anticipated d/c date is: 3 days              Patient currently is not medically stable to d/c.  Consultants:  General surgery   Procedures:  None   Microbiology  None   Antimicrobials: None     Objective: Vitals:   09/01/19 1708 09/01/19 2036 09/02/19 0451 09/02/19 0909  BP: (!) 186/62 (!) 161/77 (!) 152/89 (!) 164/66  Pulse: 67 68 72 72  Resp: 18 18 18 18   Temp: 99.1 F (37.3 C) 99.2 F (37.3 C) 98.9 F (37.2 C)   TempSrc: Oral Oral Oral   SpO2: 97% 97% 97% 96%  Weight:      Height:        Intake/Output Summary (Last 24 hours) at 09/02/2019 0941 Last data filed at 09/02/2019 0700 Gross  per 24 hour  Intake 1455.9 ml  Output 950 ml  Net 505.9 ml   Filed Weights   09-28-2019 2133 08/31/19 2114  Weight: 58.4 kg 56.4 kg    Examination:  Constitutional: No distress Eyes: No icterus seen ENMT: mmm Neck: normal, supple Respiratory: Clear bilaterally without wheeze or crackles, normal respiratory effort Cardiovascular: Regular rate and rhythm, no murmurs, no peripheral edema Abdomen: Soft, nontender, nondistended, bowel sounds diminished Musculoskeletal: no clubbing / cyanosis.  Skin: No rashes seen Neurologic: No focal deficits  Data Reviewed: I have independently reviewed following labs and imaging studies    CBC: Recent Labs  Lab 09-28-2019 0507 08/31/19 0539 09/01/19 0453 09/02/19 0317  WBC 11.1* 11.1* 6.9 2.9*  NEUTROABS 9.5*  --  5.1  --   HGB 13.4 14.3 13.9 12.6  HCT 39.5 42.3 41.6 38.6  MCV 99.7 100.7* 102.5* 102.9*  PLT 187 249 215 [id number]   Basic Metabolic Panel: Recent Labs  Lab 28-Sep-2019 0507 09/28/2019 1733 08/31/19 0539 09/01/19 0453 09/01/19 0805 09/02/19 0317  NA 134* 133* 135 137  --  132*  K 3.5 3.9 3.8 3.2*  --  3.6  CL 100 100 101 101  --  97*  CO2 23 20* 23 25  --  24  GLUCOSE 154* 144* 143* 133*  --  137*  BUN 14 10 17 21   --  23  CREATININE 0.75 0.78 0.63 0.58  --  0.54  CALCIUM 9.5 9.4 9.1 8.7*  --  8.2*  MG  --   --   --   --  2.1  --    Liver Function Tests: Recent Labs  Lab 2019-09-28 0507 09/28/2019 1733 09/02/19 0317  AST 31 31 23   ALT 16 16 17   ALKPHOS 59 71 45  BILITOT 0.9 1.1 0.9  PROT 6.8 7.4 5.8*  ALBUMIN 4.0 4.3 3.2*   Coagulation Profile: No results for input(s): INR, PROTIME in the last 168 hours. HbA1C: No results for input(s): HGBA1C in the last 72 hours. CBG: No results for input(s): GLUCAP in the last 168 hours.  Recent Results (from the past 240 hour(s))  SARS Coronavirus 2 by RT PCR (hospital order, performed in [facility] hospital lab) Nasopharyngeal Nasopharyngeal Swab     Status: None   Collection Time: September 28, 2019  8:59 AM   Specimen: Nasopharyngeal Swab  Result Value Ref Range Status   SARS Coronavirus 2 NEGATIVE NEGATIVE Final    Comment: (NOTE) SARS-CoV-2 target nucleic acids are NOT DETECTED. The SARS-CoV-2 RNA is generally detectable in upper and lower respiratory specimens during the acute phase of infection. The lowest concentration of SARS-CoV-2 viral copies this assay can detect is 250 copies / mL. A negative result does not preclude SARS-CoV-2 infection and should not be used as the sole basis for treatment or other patient management decisions.  A negative result may occur with improper specimen collection /  handling, submission of specimen other than nasopharyngeal swab, presence of viral mutation(s) within the areas targeted by this assay, and inadequate number of viral copies (<250 copies / mL). A negative result must be combined with clinical observations, patient history, and epidemiological information. Fact Sheet for Patients:   StrictlyIdeas.no Fact Sheet for Healthcare Providers: BankingDealers.co.za This test is not yet approved or cleared  by the [place] FDA and has been authorized for detection and/or diagnosis of SARS-CoV-2 by FDA under an Emergency Use Authorization (EUA).  This EUA will remain in effect (meaning this test can be used)  for the duration of the COVID-19 declaration under Section 564(b)(1) of the Act, 21 U.S.C. section 360bbb-3(b)(1), unless the authorization is terminated or revoked sooner. Performed at [facility], [place] [address] [place], [place] [address]      Radiology Studies: DG Abd Portable 1V  Result Date: 09/02/2019 CLINICAL DATA:  Small-bowel obstruction EXAM: PORTABLE ABDOMEN - 1 VIEW COMPARISON:  Portable exam 0813 hours compared to 09/01/2019 FINDINGS: Persistent gaseous distension of small bowel loops. Small amount of gas and stool in RIGHT colon. Findings remain consistent with small bowel obstruction. No definite bowel wall thickening. Tip of nasogastric tube projects over stomach. Degenerative disc and facet disease changes at lower lumbar spine with osseous demineralization. IMPRESSION: Persistent small bowel obstruction. Electronically Signed   By: Lavonia Dana M.D.   On: 09/02/2019 08:50    Marzetta Board, MD, PhD [facility]  Between 7 am - 7 pm I am available, please contact me via Amion or Securechat  Between 7 pm - 7 am I am not available, please contact night coverage MD/APP via Amion

## 2019-09-02 NOTE — Plan of Care (Signed)
  Problem: Activity: Goal: Risk for activity intolerance will decrease Outcome: Progressing   

## 2019-09-02 NOTE — Progress Notes (Signed)
Patient c/o abd pain and asks for toradol,pain med was given by day RN at 18:33,next dose not due yet. Text paged MD on call,awaiting call back.

## 2019-09-02 NOTE — Progress Notes (Addendum)
Patient accidentally pulled out NGT.MD made aware.Tube -re inserted as ordered.Two nurses verified the right tube placement.

## 2019-09-02 NOTE — Progress Notes (Signed)
  Echocardiogram 2D Echocardiogram has been performed.  Michiel Cowboy 09/02/2019, 10:17 AM

## 2019-09-03 ENCOUNTER — Inpatient Hospital Stay (HOSPITAL_COMMUNITY): Payer: Medicare Other

## 2019-09-03 ENCOUNTER — Inpatient Hospital Stay (HOSPITAL_COMMUNITY): Payer: Medicare Other | Admitting: Anesthesiology

## 2019-09-03 ENCOUNTER — Encounter (HOSPITAL_COMMUNITY): Payer: Self-pay | Admitting: Internal Medicine

## 2019-09-03 ENCOUNTER — Encounter (HOSPITAL_COMMUNITY): Admission: EM | Disposition: E | Payer: Self-pay | Source: Home / Self Care | Attending: Pulmonary Disease

## 2019-09-03 DIAGNOSIS — Z9889 Other specified postprocedural states: Secondary | ICD-10-CM

## 2019-09-03 DIAGNOSIS — K56609 Unspecified intestinal obstruction, unspecified as to partial versus complete obstruction: Secondary | ICD-10-CM

## 2019-09-03 HISTORY — PX: BOWEL RESECTION: SHX1257

## 2019-09-03 HISTORY — PX: LAPAROTOMY: SHX154

## 2019-09-03 HISTORY — PX: APPLICATION OF WOUND VAC: SHX5189

## 2019-09-03 HISTORY — PX: LYSIS OF ADHESION: SHX5961

## 2019-09-03 LAB — COMPREHENSIVE METABOLIC PANEL
ALT: 19 U/L (ref 0–44)
ALT: 682 U/L — ABNORMAL HIGH (ref 0–44)
AST: 25 U/L (ref 15–41)
AST: 866 U/L — ABNORMAL HIGH (ref 15–41)
Albumin: 3.1 g/dL — ABNORMAL LOW (ref 3.5–5.0)
Albumin: 2.6 g/dL — ABNORMAL LOW (ref 3.5–5.0)
Alkaline Phosphatase: 46 U/L (ref 38–126)
Alkaline Phosphatase: 35 U/L — ABNORMAL LOW (ref 38–126)
Anion gap: 10 (ref 5–15)
Anion gap: 8 (ref 5–15)
BUN: 29 mg/dL — ABNORMAL HIGH (ref 8–23)
BUN: 34 mg/dL — ABNORMAL HIGH (ref 8–23)
CO2: 25 mmol/L (ref 22–32)
CO2: 22 mmol/L (ref 22–32)
Calcium: 8.5 mg/dL — ABNORMAL LOW (ref 8.9–10.3)
Calcium: 7 mg/dL — ABNORMAL LOW (ref 8.9–10.3)
Chloride: 101 mmol/L (ref 98–111)
Chloride: 109 mmol/L (ref 98–111)
Creatinine, Ser: 0.75 mg/dL (ref 0.44–1.00)
Creatinine, Ser: 1.47 mg/dL — ABNORMAL HIGH (ref 0.44–1.00)
GFR calc Af Amer: >60 mL/min (ref >60)
GFR calc Af Amer: 39 mL/min — ABNORMAL LOW (ref >60)
GFR calc non Af Amer: >60 mL/min (ref >60)
GFR calc non Af Amer: 34 mL/min — ABNORMAL LOW (ref >60)
Glucose, Bld: 133 mg/dL — ABNORMAL HIGH (ref 70–99)
Glucose, Bld: 160 mg/dL — ABNORMAL HIGH (ref 70–99)
Potassium: 3.5 mmol/L (ref 3.5–5.1)
Potassium: 2.7 mmol/L — CL (ref 3.5–5.1)
Sodium: 136 mmol/L (ref 135–145)
Sodium: 139 mmol/L (ref 135–145)
Total Bilirubin: 1 mg/dL (ref 0.3–1.2)
Total Bilirubin: 0.7 mg/dL (ref 0.3–1.2)
Total Protein: 5.9 g/dL — ABNORMAL LOW (ref 6.5–8.1)
Total Protein: 4.2 g/dL — ABNORMAL LOW (ref 6.5–8.1)

## 2019-09-03 LAB — COMPREHENSIVE METABOLIC PANEL WITH GFR
ALT: 774 U/L — ABNORMAL HIGH (ref 0–44)
AST: 1585 U/L — ABNORMAL HIGH (ref 15–41)
Albumin: 2.6 g/dL — ABNORMAL LOW (ref 3.5–5.0)
Alkaline Phosphatase: 39 U/L (ref 38–126)
Anion gap: 14 (ref 5–15)
BUN: 38 mg/dL — ABNORMAL HIGH (ref 8–23)
CO2: 19 mmol/L — ABNORMAL LOW (ref 22–32)
Calcium: 7.5 mg/dL — ABNORMAL LOW (ref 8.9–10.3)
Chloride: 107 mmol/L (ref 98–111)
Creatinine, Ser: 1.8 mg/dL — ABNORMAL HIGH (ref 0.44–1.00)
GFR calc Af Amer: 31 mL/min — ABNORMAL LOW
GFR calc non Af Amer: 26 mL/min — ABNORMAL LOW
Glucose, Bld: 105 mg/dL — ABNORMAL HIGH (ref 70–99)
Potassium: 4.8 mmol/L (ref 3.5–5.1)
Sodium: 140 mmol/L (ref 135–145)
Total Bilirubin: 1.3 mg/dL — ABNORMAL HIGH (ref 0.3–1.2)
Total Protein: 4.3 g/dL — ABNORMAL LOW (ref 6.5–8.1)

## 2019-09-03 LAB — POCT I-STAT 7, (LYTES, BLD GAS, ICA,H+H)
Acid-base deficit: 3 mmol/L — ABNORMAL HIGH (ref 0.0–2.0)
Acid-base deficit: 1 mmol/L (ref 0.0–2.0)
Acid-base deficit: 3 mmol/L — ABNORMAL HIGH (ref 0.0–2.0)
Bicarbonate: 22.3 mmol/L (ref 20.0–28.0)
Bicarbonate: 25.8 mmol/L (ref 20.0–28.0)
Bicarbonate: 21.5 mmol/L (ref 20.0–28.0)
Calcium, Ion: 1.12 mmol/L — ABNORMAL LOW (ref 1.15–1.40)
Calcium, Ion: 1.11 mmol/L — ABNORMAL LOW (ref 1.15–1.40)
Calcium, Ion: 1.2 mmol/L (ref 1.15–1.40)
HCT: 28 % — ABNORMAL LOW (ref 36.0–46.0)
HCT: 26 % — ABNORMAL LOW (ref 36.0–46.0)
HCT: 23 % — ABNORMAL LOW (ref 36.0–46.0)
Hemoglobin: 9.5 g/dL — ABNORMAL LOW (ref 12.0–15.0)
Hemoglobin: 8.8 g/dL — ABNORMAL LOW (ref 12.0–15.0)
Hemoglobin: 7.8 g/dL — ABNORMAL LOW (ref 12.0–15.0)
O2 Saturation: 100 %
O2 Saturation: 100 %
O2 Saturation: 100 %
Patient temperature: 38.8
Patient temperature: 98.2
Potassium: 3.2 mmol/L — ABNORMAL LOW (ref 3.5–5.1)
Potassium: 3.1 mmol/L — ABNORMAL LOW (ref 3.5–5.1)
Potassium: 4.6 mmol/L (ref 3.5–5.1)
Sodium: 139 mmol/L (ref 135–145)
Sodium: 139 mmol/L (ref 135–145)
Sodium: 138 mmol/L (ref 135–145)
TCO2: 23 mmol/L (ref 22–32)
TCO2: 27 mmol/L (ref 22–32)
TCO2: 23 mmol/L (ref 22–32)
pCO2 arterial: 43.2 mmHg (ref 32.0–48.0)
pCO2 arterial: 51.2 mmHg — ABNORMAL HIGH (ref 32.0–48.0)
pCO2 arterial: 36.8 mmHg (ref 32.0–48.0)
pH, Arterial: 7.328 — ABNORMAL LOW (ref 7.350–7.450)
pH, Arterial: 7.31 — ABNORMAL LOW (ref 7.350–7.450)
pH, Arterial: 7.373 (ref 7.350–7.450)
pO2, Arterial: 402 mmHg — ABNORMAL HIGH (ref 83.0–108.0)
pO2, Arterial: 557 mmHg — ABNORMAL HIGH (ref 83.0–108.0)
pO2, Arterial: 247 mmHg — ABNORMAL HIGH (ref 83.0–108.0)

## 2019-09-03 LAB — BASIC METABOLIC PANEL
Anion gap: 11 (ref 5–15)
BUN: 41 mg/dL — ABNORMAL HIGH (ref 8–23)
CO2: 16 mmol/L — ABNORMAL LOW (ref 22–32)
Calcium: 7.5 mg/dL — ABNORMAL LOW (ref 8.9–10.3)
Chloride: 108 mmol/L (ref 98–111)
Creatinine, Ser: 2.14 mg/dL — ABNORMAL HIGH (ref 0.44–1.00)
GFR calc Af Amer: 25 mL/min — ABNORMAL LOW (ref >60)
GFR calc non Af Amer: 21 mL/min — ABNORMAL LOW (ref >60)
Glucose, Bld: 102 mg/dL — ABNORMAL HIGH (ref 70–99)
Potassium: 6.6 mmol/L (ref 3.5–5.1)
Sodium: 135 mmol/L (ref 135–145)

## 2019-09-03 LAB — GLUCOSE, CAPILLARY
Glucose-Capillary: 34 mg/dL — CL (ref 70–99)
Glucose-Capillary: 81 mg/dL (ref 70–99)
Glucose-Capillary: 85 mg/dL (ref 70–99)
Glucose-Capillary: 95 mg/dL (ref 70–99)
Glucose-Capillary: 97 mg/dL (ref 70–99)

## 2019-09-03 LAB — CBC
HCT: 35.1 % — ABNORMAL LOW (ref 36.0–46.0)
HCT: 29 % — ABNORMAL LOW (ref 36.0–46.0)
Hemoglobin: 11.7 g/dL — ABNORMAL LOW (ref 12.0–15.0)
Hemoglobin: 9.3 g/dL — ABNORMAL LOW (ref 12.0–15.0)
MCH: 33.8 pg (ref 26.0–34.0)
MCH: 33.5 pg (ref 26.0–34.0)
MCHC: 33.3 g/dL (ref 30.0–36.0)
MCHC: 32.1 g/dL (ref 30.0–36.0)
MCV: 101.4 fL — ABNORMAL HIGH (ref 80.0–100.0)
MCV: 104.3 fL — ABNORMAL HIGH (ref 80.0–100.0)
Platelets: 206 10*3/uL (ref 150–400)
Platelets: 151 K/uL (ref 150–400)
RBC: 3.46 MIL/uL — ABNORMAL LOW (ref 3.87–5.11)
RBC: 2.78 MIL/uL — ABNORMAL LOW (ref 3.87–5.11)
RDW: 11.9 % (ref 11.5–15.5)
RDW: 11.9 % (ref 11.5–15.5)
WBC: 5.5 10*3/uL (ref 4.0–10.5)
WBC: 5.7 K/uL (ref 4.0–10.5)
nRBC: 0 % (ref 0.0–0.2)
nRBC: 0 % (ref 0.0–0.2)

## 2019-09-03 LAB — MAGNESIUM: Magnesium: 2.8 mg/dL — ABNORMAL HIGH (ref 1.7–2.4)

## 2019-09-03 LAB — PHOSPHORUS: Phosphorus: 2 mg/dL — ABNORMAL LOW (ref 2.5–4.6)

## 2019-09-03 LAB — ABO/RH: ABO/RH(D): O POS

## 2019-09-03 LAB — SURGICAL PCR SCREEN
MRSA, PCR: NEGATIVE
Staphylococcus aureus: NEGATIVE

## 2019-09-03 SURGERY — LAPAROTOMY, EXPLORATORY
Anesthesia: General | Site: Abdomen

## 2019-09-03 MED ORDER — LIDOCAINE 2% (20 MG/ML) 5 ML SYRINGE
INTRAMUSCULAR | Status: AC
  Filled 2019-09-03: qty 5

## 2019-09-03 MED ORDER — MAGNESIUM SULFATE 2 GM/50ML IV SOLN
2.0000 g | Freq: Once | INTRAVENOUS | Status: AC
  Administered 2019-09-03: 2 g via INTRAVENOUS
  Filled 2019-09-03: qty 50

## 2019-09-03 MED ORDER — FENTANYL CITRATE (PF) 100 MCG/2ML IJ SOLN: 25.0000 ug | Freq: Once | INTRAMUSCULAR | Status: DC

## 2019-09-03 MED ORDER — PHENYLEPHRINE 40 MCG/ML (10ML) SYRINGE FOR IV PUSH (FOR BLOOD PRESSURE SUPPORT)
PREFILLED_SYRINGE | INTRAVENOUS | Status: DC | PRN
  Administered 2019-09-03: 120 ug via INTRAVENOUS
  Administered 2019-09-03: 40 ug via INTRAVENOUS
  Administered 2019-09-03: 80 ug via INTRAVENOUS

## 2019-09-03 MED ORDER — LACTATED RINGERS IV SOLN: INTRAVENOUS | Status: DC | PRN

## 2019-09-03 MED ORDER — FENTANYL CITRATE (PF) 100 MCG/2ML IJ SOLN: 25.0000 ug | INTRAMUSCULAR | Status: DC | PRN

## 2019-09-03 MED ORDER — ORAL CARE MOUTH RINSE: 15.0000 mL | Freq: Once | OROMUCOSAL | Status: AC

## 2019-09-03 MED ORDER — ROCURONIUM BROMIDE 10 MG/ML (PF) SYRINGE
PREFILLED_SYRINGE | INTRAVENOUS | Status: DC | PRN
  Administered 2019-09-03 (×2): 50 mg via INTRAVENOUS

## 2019-09-03 MED ORDER — LACTATED RINGERS IV SOLN: INTRAVENOUS | Status: DC

## 2019-09-03 MED ORDER — ACETAMINOPHEN 10 MG/ML IV SOLN
1000.0000 mg | Freq: Four times a day (QID) | INTRAVENOUS | Status: AC
  Administered 2019-09-03 – 2019-09-04 (×4): 1000 mg via INTRAVENOUS
  Filled 2019-09-03 (×4): qty 100

## 2019-09-03 MED ORDER — 0.9 % SODIUM CHLORIDE (POUR BTL) OPTIME
TOPICAL | Status: DC | PRN
  Administered 2019-09-03 (×4): 1000 mL

## 2019-09-03 MED ORDER — CEFAZOLIN SODIUM-DEXTROSE 2-4 GM/100ML-% IV SOLN
INTRAVENOUS | Status: AC
  Filled 2019-09-03: qty 100

## 2019-09-03 MED ORDER — ENOXAPARIN SODIUM 40 MG/0.4ML ~~LOC~~ SOLN
30.0000 mg | SUBCUTANEOUS | Status: DC
  Administered 2019-09-04 – 2019-09-05 (×2): 30 mg via SUBCUTANEOUS
  Filled 2019-09-03 (×3): qty 0.4

## 2019-09-03 MED ORDER — CALCIUM GLUCONATE-NACL 1-0.675 GM/50ML-% IV SOLN
1.0000 g | Freq: Once | INTRAVENOUS | Status: AC
  Administered 2019-09-03: 1000 mg via INTRAVENOUS
  Filled 2019-09-03: qty 50

## 2019-09-03 MED ORDER — CHLORHEXIDINE GLUCONATE 0.12 % MT SOLN
15.0000 mL | Freq: Once | OROMUCOSAL | Status: AC
  Administered 2019-09-03: 15 mL via OROMUCOSAL

## 2019-09-03 MED ORDER — HYDROCORTISONE NA SUCCINATE PF 100 MG IJ SOLR
50.0000 mg | Freq: Four times a day (QID) | INTRAMUSCULAR | Status: DC
  Administered 2019-09-03 – 2019-09-05 (×9): 50 mg via INTRAVENOUS
  Filled 2019-09-03 (×9): qty 2

## 2019-09-03 MED ORDER — CHLORHEXIDINE GLUCONATE CLOTH 2 % EX PADS
6.0000 | MEDICATED_PAD | Freq: Every day | CUTANEOUS | Status: DC
  Administered 2019-09-03 – 2019-09-04 (×2): 6 via TOPICAL

## 2019-09-03 MED ORDER — CHLORHEXIDINE GLUCONATE 0.12% ORAL RINSE (MEDLINE KIT)
15.0000 mL | Freq: Two times a day (BID) | OROMUCOSAL | Status: DC
  Administered 2019-09-03 – 2019-09-05 (×6): 15 mL via OROMUCOSAL

## 2019-09-03 MED ORDER — FENTANYL BOLUS VIA INFUSION
25.0000 ug | INTRAVENOUS | Status: DC | PRN
  Filled 2019-09-03: qty 25

## 2019-09-03 MED ORDER — FENTANYL CITRATE (PF) 250 MCG/5ML IJ SOLN
INTRAMUSCULAR | Status: AC
  Filled 2019-09-03: qty 5

## 2019-09-03 MED ORDER — MIDAZOLAM HCL 2 MG/2ML IJ SOLN: 1.0000 mg | INTRAMUSCULAR | Status: DC | PRN

## 2019-09-03 MED ORDER — SUCCINYLCHOLINE CHLORIDE 20 MG/ML IJ SOLN
INTRAMUSCULAR | Status: DC | PRN
  Administered 2019-09-03: 100 mg via INTRAVENOUS

## 2019-09-03 MED ORDER — PHENYLEPHRINE HCL-NACL 10-0.9 MG/250ML-% IV SOLN
INTRAVENOUS | Status: DC | PRN
  Administered 2019-09-03: 25 ug/min via INTRAVENOUS

## 2019-09-03 MED ORDER — FENTANYL 2500MCG IN NS 250ML (10MCG/ML) PREMIX INFUSION
25.0000 ug/h | INTRAVENOUS | Status: DC
  Filled 2019-09-03: qty 250

## 2019-09-03 MED ORDER — PHENYLEPHRINE 40 MCG/ML (10ML) SYRINGE FOR IV PUSH (FOR BLOOD PRESSURE SUPPORT)
PREFILLED_SYRINGE | INTRAVENOUS | Status: AC
  Filled 2019-09-03: qty 20

## 2019-09-03 MED ORDER — SODIUM CHLORIDE 0.9 % IV SOLN
2.0000 g | Freq: Two times a day (BID) | INTRAVENOUS | Status: DC
  Administered 2019-09-03: 2 g via INTRAVENOUS
  Filled 2019-09-03 (×3): qty 2

## 2019-09-03 MED ORDER — SODIUM CHLORIDE 0.9 % IV SOLN: INTRAVENOUS | Status: DC | PRN

## 2019-09-03 MED ORDER — DEXAMETHASONE SODIUM PHOSPHATE 10 MG/ML IJ SOLN
INTRAMUSCULAR | Status: AC
  Filled 2019-09-03: qty 1

## 2019-09-03 MED ORDER — ROCURONIUM BROMIDE 10 MG/ML (PF) SYRINGE
PREFILLED_SYRINGE | INTRAVENOUS | Status: AC
  Filled 2019-09-03: qty 10

## 2019-09-03 MED ORDER — ALBUMIN HUMAN 5 % IV SOLN
INTRAVENOUS | Status: DC | PRN
Start: 2019-09-03 — End: 2019-09-03

## 2019-09-03 MED ORDER — LIDOCAINE 2% (20 MG/ML) 5 ML SYRINGE
INTRAMUSCULAR | Status: DC | PRN
  Administered 2019-09-03: 40 mg via INTRAVENOUS

## 2019-09-03 MED ORDER — PANTOPRAZOLE SODIUM 40 MG IV SOLR: 40.0000 mg | Freq: Every day | INTRAVENOUS | Status: DC

## 2019-09-03 MED ORDER — LACTATED RINGERS IV BOLUS
1000.0000 mL | Freq: Once | INTRAVENOUS | Status: AC
  Administered 2019-09-03: 1000 mL via INTRAVENOUS

## 2019-09-03 MED ORDER — PROPOFOL 1000 MG/100ML IV EMUL
INTRAVENOUS | Status: AC
  Filled 2019-09-03: qty 100

## 2019-09-03 MED ORDER — ORAL CARE MOUTH RINSE
15.0000 mL | OROMUCOSAL | Status: DC
  Administered 2019-09-03 – 2019-09-05 (×21): 15 mL via OROMUCOSAL

## 2019-09-03 MED ORDER — KETOROLAC TROMETHAMINE 30 MG/ML IJ SOLN
INTRAMUSCULAR | Status: AC
  Filled 2019-09-03: qty 1

## 2019-09-03 MED ORDER — FENTANYL 2500MCG IN NS 250ML (10MCG/ML) PREMIX INFUSION
25.0000 ug/h | INTRAVENOUS | Status: DC
  Administered 2019-09-03 – 2019-09-05 (×3): 100 ug/h via INTRAVENOUS
  Filled 2019-09-03 (×2): qty 250

## 2019-09-03 MED ORDER — CHLORHEXIDINE GLUCONATE 0.12 % MT SOLN
OROMUCOSAL | Status: AC
  Administered 2019-09-03: 15 mL via OROMUCOSAL
  Filled 2019-09-03: qty 15

## 2019-09-03 MED ORDER — PROPOFOL 500 MG/50ML IV EMUL
INTRAVENOUS | Status: DC | PRN
Start: 2019-09-03 — End: 2019-09-03
  Administered 2019-09-03: 25 ug/kg/min via INTRAVENOUS

## 2019-09-03 MED ORDER — VASOPRESSIN 20 UNIT/ML IV SOLN
0.0400 [IU]/min | INTRAVENOUS | Status: DC
  Administered 2019-09-03 – 2019-09-04 (×2): 0.04 [IU]/min via INTRAVENOUS
  Filled 2019-09-03 (×3): qty 2

## 2019-09-03 MED ORDER — NOREPINEPHRINE 4 MG/250ML-% IV SOLN
0.0000 ug/min | INTRAVENOUS | Status: DC
  Administered 2019-09-03: 4 ug/min via INTRAVENOUS

## 2019-09-03 MED ORDER — DEXAMETHASONE SODIUM PHOSPHATE 10 MG/ML IJ SOLN
INTRAMUSCULAR | Status: DC | PRN
  Administered 2019-09-03: 10 mg via INTRAVENOUS

## 2019-09-03 MED ORDER — NOREPINEPHRINE 4 MG/250ML-% IV SOLN
INTRAVENOUS | Status: AC
  Filled 2019-09-03: qty 250

## 2019-09-03 MED ORDER — PHENYLEPHRINE 40 MCG/ML (10ML) SYRINGE FOR IV PUSH (FOR BLOOD PRESSURE SUPPORT)
PREFILLED_SYRINGE | INTRAVENOUS | Status: AC
  Filled 2019-09-03: qty 10

## 2019-09-03 MED ORDER — POTASSIUM CHLORIDE 10 MEQ/50ML IV SOLN
10.0000 meq | INTRAVENOUS | Status: AC
  Administered 2019-09-03 (×8): 10 meq via INTRAVENOUS
  Filled 2019-09-03 (×5): qty 50

## 2019-09-03 MED ORDER — SODIUM CHLORIDE 0.9 % IV SOLN
1.0000 g | Freq: Two times a day (BID) | INTRAVENOUS | Status: DC
  Administered 2019-09-04 – 2019-09-05 (×3): 1 g via INTRAVENOUS
  Filled 2019-09-03 (×4): qty 1

## 2019-09-03 MED ORDER — PROPOFOL 1000 MG/100ML IV EMUL
0.0000 ug/kg/min | INTRAVENOUS | Status: DC
  Administered 2019-09-03: 30 ug/kg/min via INTRAVENOUS
  Administered 2019-09-04 – 2019-09-05 (×3): 20 ug/kg/min via INTRAVENOUS
  Filled 2019-09-03 (×4): qty 100

## 2019-09-03 MED ORDER — CHLORHEXIDINE GLUCONATE 0.12 % MT SOLN: 15.0000 mL | Freq: Once | OROMUCOSAL | Status: AC

## 2019-09-03 MED ORDER — ONDANSETRON HCL 4 MG/2ML IJ SOLN
INTRAMUSCULAR | Status: AC
  Filled 2019-09-03: qty 2

## 2019-09-03 MED ORDER — PROPOFOL 10 MG/ML IV BOLUS
INTRAVENOUS | Status: DC | PRN
  Administered 2019-09-03: 60 mg via INTRAVENOUS

## 2019-09-03 MED ORDER — NOREPINEPHRINE 16 MG/250ML-% IV SOLN
0.0000 ug/min | INTRAVENOUS | Status: DC
  Administered 2019-09-03: 30 ug/min via INTRAVENOUS
  Administered 2019-09-05: 16 ug/min via INTRAVENOUS
  Administered 2019-09-05: 20 ug/min via INTRAVENOUS
  Filled 2019-09-03 (×4): qty 250

## 2019-09-03 MED ORDER — DEXTROSE 50 % IV SOLN
INTRAVENOUS | Status: AC
  Administered 2019-09-03: 50 mL
  Filled 2019-09-03: qty 50

## 2019-09-03 MED ORDER — FENTANYL CITRATE (PF) 100 MCG/2ML IJ SOLN
INTRAMUSCULAR | Status: DC | PRN
  Administered 2019-09-03: 150 ug via INTRAVENOUS

## 2019-09-03 SURGICAL SUPPLY — 54 items
APL PRP STRL LF DISP 70% ISPRP (MISCELLANEOUS) ×1
BLADE CLIPPER SURG (BLADE) IMPLANT
CANISTER SUCT 3000ML PPV (MISCELLANEOUS) ×3 IMPLANT
CANISTER WOUNDNEG PRESSURE 500 (CANNISTER) ×2 IMPLANT
CHLORAPREP W/TINT 26 (MISCELLANEOUS) ×3 IMPLANT
COVER SURGICAL LIGHT HANDLE (MISCELLANEOUS) ×3 IMPLANT
COVER WAND RF STERILE (DRAPES) ×3 IMPLANT
DRAPE LAPAROSCOPIC ABDOMINAL (DRAPES) ×3 IMPLANT
DRAPE WARM FLUID 44X44 (DRAPES) ×3 IMPLANT
DRSG OPSITE POSTOP 4X10 (GAUZE/BANDAGES/DRESSINGS) IMPLANT
DRSG OPSITE POSTOP 4X8 (GAUZE/BANDAGES/DRESSINGS) IMPLANT
ELECT BLADE 6.5 EXT (BLADE) IMPLANT
ELECT CAUTERY BLADE 6.4 (BLADE) ×6 IMPLANT
ELECT REM PT RETURN 9FT ADLT (ELECTROSURGICAL) ×3
ELECTRODE REM PT RTRN 9FT ADLT (ELECTROSURGICAL) ×1 IMPLANT
GAUZE SPONGE 4X4 12PLY STRL (GAUZE/BANDAGES/DRESSINGS) ×3 IMPLANT
GLOVE BIO SURGEON STRL SZ7 (GLOVE) ×6 IMPLANT
GLOVE BIOGEL PI IND STRL 7.5 (GLOVE) ×1 IMPLANT
GLOVE BIOGEL PI INDICATOR 7.5 (GLOVE) ×2
GOWN STRL REUS W/ TWL LRG LVL3 (GOWN DISPOSABLE) ×3 IMPLANT
GOWN STRL REUS W/TWL LRG LVL3 (GOWN DISPOSABLE) ×9
HANDLE SUCTION POOLE (INSTRUMENTS) ×1 IMPLANT
KIT BASIN OR (CUSTOM PROCEDURE TRAY) ×3 IMPLANT
KIT TURNOVER KIT B (KITS) ×3 IMPLANT
LIGASURE IMPACT 36 18CM CVD LR (INSTRUMENTS) ×2 IMPLANT
NS IRRIG 1000ML POUR BTL (IV SOLUTION) ×6 IMPLANT
PACK GENERAL/GYN (CUSTOM PROCEDURE TRAY) ×3 IMPLANT
PAD ARMBOARD 7.5X6 YLW CONV (MISCELLANEOUS) ×3 IMPLANT
PENCIL SMOKE EVACUATOR (MISCELLANEOUS) ×3 IMPLANT
RELOAD PROXIMATE 75MM BLUE (ENDOMECHANICALS) ×6 IMPLANT
RELOAD STAPLE 75 3.8 BLU REG (ENDOMECHANICALS) IMPLANT
SPECIMEN JAR LARGE (MISCELLANEOUS) IMPLANT
SPONGE ABD ABTHERA ADVANCE (MISCELLANEOUS) ×2 IMPLANT
SPONGE INTESTINAL PEANUT (DISPOSABLE) ×2 IMPLANT
SPONGE LAP 18X18 RF (DISPOSABLE) IMPLANT
STAPLER GUN LINEAR PROX 60 (STAPLE) ×2 IMPLANT
STAPLER PROXIMATE 75MM BLUE (STAPLE) ×2 IMPLANT
STAPLER VISISTAT 35W (STAPLE) ×3 IMPLANT
SUCTION POOLE HANDLE (INSTRUMENTS) ×3
SUT PDS AB 1 TP1 96 (SUTURE) ×6 IMPLANT
SUT SILK 2 0 (SUTURE) ×3
SUT SILK 2 0 SH CR/8 (SUTURE) ×3 IMPLANT
SUT SILK 2 0 TIES 10X30 (SUTURE) ×3 IMPLANT
SUT SILK 2-0 18XBRD TIE 12 (SUTURE) ×1 IMPLANT
SUT SILK 3 0 (SUTURE) ×3
SUT SILK 3 0 SH CR/8 (SUTURE) ×3 IMPLANT
SUT SILK 3 0 TIES 10X30 (SUTURE) ×3 IMPLANT
SUT SILK 3-0 18XBRD TIE 12 (SUTURE) ×1 IMPLANT
SUT VIC AB 3-0 SH 18 (SUTURE) IMPLANT
TOWEL GREEN STERILE (TOWEL DISPOSABLE) ×3 IMPLANT
TOWEL GREEN STERILE FF (TOWEL DISPOSABLE) ×3 IMPLANT
TRAY FOLEY MTR SLVR 14FR STAT (SET/KITS/TRAYS/PACK) IMPLANT
TRAY FOLEY MTR SLVR 16FR STAT (SET/KITS/TRAYS/PACK) IMPLANT
YANKAUER SUCT BULB TIP NO VENT (SUCTIONS) ×3 IMPLANT

## 2019-09-03 NOTE — Transfer of Care (Signed)
Immediate Anesthesia Transfer of Care Note  Patient: [name]  Procedure(s) Performed: EXPLORATORY LAPAROTOMY (N/A Abdomen) SMALL BOWEL RESECTION (N/A Abdomen) Lysis Of Adhesion (N/A Abdomen) Application Of Wound Vac (N/A Abdomen)  Patient Location: ICU  Anesthesia Type:General  Level of Consciousness: Patient remains intubated per anesthesia plan  Airway & Oxygen Therapy: Patient remains intubated per anesthesia plan and Patient placed on Ventilator (see vital sign flow sheet for setting)  Post-op Assessment: Report given to RN and Post -op Vital signs reviewed and stable  Post vital signs: Reviewed and stable  Last Vitals:  Vitals Value Taken Time  BP    Temp    Pulse    Resp 20 09/02/2019 1222  SpO2    Vitals shown include unvalidated device data.  Last Pain:  Vitals:   09/04/2019 0800  TempSrc:   PainSc: 0-No pain      Patients Stated Pain Goal: 0 ([id number])  Complications: No apparent anesthesia complications

## 2019-09-03 NOTE — Anesthesia Procedure Notes (Addendum)
Procedure Name: Intubation Date/Time: 08/12/2019 9:53 AM Performed by: Kyung Rudd, CRNA Pre-anesthesia Checklist: Patient identified, Emergency Drugs available, Suction available, Patient being monitored and Timeout performed Patient Re-evaluated:Patient Re-evaluated prior to induction Oxygen Delivery Method: Circle system utilized Preoxygenation: Pre-oxygenation with 100% oxygen Induction Type: IV induction, Rapid sequence and Cricoid Pressure applied Laryngoscope Size: Mac and 3 Grade View: Grade I Tube type: Oral Tube size: 7.0 mm Number of attempts: 1 Airway Equipment and Method: Stylet Placement Confirmation: ETT inserted through vocal cords under direct vision,  positive ETCO2 and breath sounds checked- equal and bilateral Secured at: 20 cm Tube secured with: Tape Dental Injury: Teeth and Oropharynx as per pre-operative assessment

## 2019-09-03 NOTE — Anesthesia Preprocedure Evaluation (Addendum)
Anesthesia Evaluation  Patient identified by MRN, date of birth, ID band Patient awake    Reviewed: Allergy & Precautions, NPO status , Patient's Chart, lab work & pertinent test results  Airway Mallampati: I  TM Distance: >3 FB Neck ROM: Full    Dental  (+) Teeth Intact, Dental Advisory Given   Pulmonary    breath sounds clear to auscultation       Cardiovascular hypertension, + CAD   Rhythm:Regular Rate:Normal     Neuro/Psych  Headaches, PSYCHIATRIC DISORDERS Anxiety Depression  Neuromuscular disease    GI/Hepatic Neg liver ROS, GERD  ,  Endo/Other  negative endocrine ROS  Renal/GU Renal InsufficiencyRenal disease     Musculoskeletal  (+) Fibromyalgia -  Abdominal Normal abdominal exam  (+)   Peds  Hematology negative hematology ROS (+)   Anesthesia Other Findings   Reproductive/Obstetrics                            Anesthesia Physical Anesthesia Plan  ASA: II  Anesthesia Plan: General   Post-op Pain Management:    Induction: Intravenous  PONV Risk Score and Plan: 4 or greater and Ondansetron, Dexamethasone and Treatment may vary due to age or medical condition  Airway Management Planned: Oral ETT  Additional Equipment: CVP, Ultrasound Guidance Line Placement and Arterial line  Intra-op Plan:   Post-operative Plan: Possible Post-op intubation/ventilation  Informed Consent: I have reviewed the patients History and Physical, chart, labs and discussed the procedure including the risks, benefits and alternatives for the proposed anesthesia with the patient or authorized representative who has indicated his/her understanding and acceptance.     Dental advisory given  Plan Discussed with: CRNA  Anesthesia Plan Comments: (  Echo:  1. Left ventricular ejection fraction, by estimation, is 55 to 60%. The  left ventricle has normal function. The left ventricle has no regional   wall motion abnormalities. Left ventricular diastolic parameters are  consistent with Grade I diastolic  dysfunction (impaired relaxation).  2. Right ventricular systolic function is normal. The right ventricular  size is normal. There is mildly elevated pulmonary artery systolic  pressure.  3. The mitral valve is grossly normal. Trivial mitral valve  regurgitation.  4. The aortic valve is tricuspid. Aortic valve regurgitation is not  visualized. Mild aortic valve sclerosis is present, with no evidence of  aortic valve stenosis.  5. The inferior vena cava is normal in size with greater than 50%  respiratory variability, suggesting right atrial pressure of 3 mmHg. )      Anesthesia Quick Evaluation

## 2019-09-03 NOTE — Op Note (Signed)
Preop diagnosis: Persistent small bowel obstruction Postop diagnosis: Small bowel obstruction with necrotic ileum to the level of the ileocecal valve Procedure performed: Exploratory laparotomy, resection of ileum and cecum with primary anastomosis, placement of ABThera abdominal vacuum dressing Surgeon:Katriona Schmierer K Nataki Mccrumb Assistant:  Melina Modena, PA-C Anesthesia: General Indications: This is a 79 year old female with multiple medical problems who has had previous abdominal surgery including sigmoid colectomy for what sounds like diverticulitis.  She also had a ventral hernia repair with mesh several years ago in [place].  She presented earlier in the week with a small bowel obstruction.  However she began having some flatus and her abdomen seem relatively soft with nasogastric decompression.  However over the last day and a half she has become more distended.  She did not have any peritoneal signs but was distended.Marland Kitchen  She was hemodynamically stable.  Her white blood cell count was normal this morning.  She presents now for exploration.  Description of procedure: The patient was brought to the operating room and placed in the supine position on the operating room table.  After an adequate level of general anesthesia was obtained, a central line, a radial arterial line, and a Foley catheter were all placed under sterile technique.  Her abdomen was prepped with ChloraPrep and draped in sterile fashion.  A timeout was taken to ensure the proper patient and proper procedure.  We made a lower midline incision through her old scar.  I dissected down the fascia.  I continued dissecting through the fascia until we encountered the previous hernia mesh.  I dissected inferiorly until I found the edge of the hernia mesh.  We then carefully dissected into the peritoneal cavity.  Immediately we encountered a large amount of bloody ascites.  This was suctioned out.  I could immediately visualize some ischemic bowel.  We  divided the mesh vertically after ensuring there were no adhesions to the posterior surface of the mesh.  We open the incision wider and placed a Balfour retractor.  There is a long segment of what appears to be terminal ileum entering the cecum and appears frankly ischemic.  There is 1 area of perforation.  We were able to carefully deliver this area of ischemic bowel up into the field.  A single band was identified that was causing the obstruction.  We ligated this with 2-0 silk sutures and divided the band.  There were no other adhesions to the small bowel.  We had to resect approximately 40 cm of ileum.  We mobilized the cecum which was fairly redundant with a long right colon.  I divided the small bowel proximal to the area of ischemia and we divided the right colon above the cecum.  The mesentery was taken with the LigaSure device.  The specimen was passed off the field.  We then thoroughly irrigated and inspected for hemostasis.  I created a side-to-side anastomosis with a GIA-75 stapler and a TA 60 stapler.  The patient was having some intermittent hypotension as well as tachycardia throughout the case.  After discussion with anesthesia, we made the decision to leave her intubated overnight.  We will leave her abdomen open and will come back for reexploration in 24 to 48 hours to make sure that the anastomosis is not ischemic and is intact.  We again inspected for hemostasis.  We irrigated the abdomen thoroughly.  An ABThera VAC sponge was brought onto the field and placed in the abdomen.  The overlying sponges were cut to fit  and were sealed with an occlusive drape.  The VAC dressing was placed to suction with a good seal.  The patient is then transported to the intensive care unit in critical condition.  All sponge, instrument, and needle counts are correct.  Imogene Burn. Georgette Dover, MD, [facility]  General/ Trauma Surgery   08/18/2019 11:53 AM

## 2019-09-03 NOTE — Progress Notes (Signed)
PROGRESS NOTE  [name] [id number] DOB: 10-18-1940 DOA: 08/20/2019 PCP: Binnie Rail, MD   LOS: 4 days   Brief Narrative / Interim history: 79 year old female with history of nonobstructive CAD, HLD, IBS, depression, came into the hospital with abdominal pain.  She was admitted on 5/24.  Imaging in the ED showed small bowel obstruction (CT showed mid small bowel obstruction with a transition zones), she got an NG tube placed and general surgery was consulted. She was treated conservatively but it appears that she is not improving on plans are in place for operative exploration.  Subjective / 24h Interval events: Remains symptomatic this morning.  She is about to have surgery.  No chest pain, no shortness of breath.  Assessment & Plan: Principal Problem Acute small bowel obstruction-CT scan with mid small bowel obstruction with transition point.  General surgery consulted and following, she does not seem to be improving with conservative management and needs operative repair -In terms of preop assessment, patient denies any prior cardiac history. She denies any anginal type symptoms or shortness of breath with exertion. She is walking every day without issues. On chart review, she did have an episode of shortness of breath few months ago for which she was seen as an outpatient by her PCP, patient believed that it appeared after starting Diamox -2D echo done preop yesterday showed normal EF with no wall motion abnormalities  Active Problems Hypertensive urgency-blood pressure on admission up into the 180s.  Resume clonidine patch, blood pressure overall stable. 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 chlorhexidine      . [MAR Hold] cloNIDine  0.1 mg Transdermal Weekly  . [MAR Hold] enoxaparin (LOVENOX) injection  40 mg Subcutaneous Q24H  . [MAR Hold] pantoprazole (PROTONIX) IV   40 mg Intravenous Q24H  . [MAR Hold] sodium chloride flush  3 mL Intravenous Q12H  . [MAR Hold] Tafluprost (PF)  1 drop Both Eyes Daily   Continuous Infusions: . 0.9 % NaCl with KCl 20 mEq / L 75 mL/hr at 09/02/2019 0038  . ceFAZolin    . [MAR Hold]  ceFAZolin (ANCEF) IV    . [MAR Hold] magnesium sulfate bolus IVPB     PRN Meds:.0.9 % irrigation (POUR BTL), [MAR Hold] acetaminophen **OR** [MAR Hold] acetaminophen, [MAR Hold] diphenhydrAMINE, [MAR Hold] fluticasone, [MAR Hold] hydrALAZINE, [MAR Hold] ketorolac, [MAR Hold] LORazepam, [MAR Hold] ondansetron (ZOFRAN) IV, [MAR Hold] phenol, [MAR Hold] prochlorperazine  DVT prophylaxis: Lovenox Code Status: Full code Family Communication: no family at bedside   Status is: Inpatient  Remains inpatient appropriate because:IV treatments appropriate due to intensity of illness or inability to take PO  Dispo: The patient is from: Home              Anticipated d/c is to: Home              Anticipated d/c date is: 3 days              Patient currently is not medically stable to d/c.  Consultants:  General surgery   Procedures:  None   Microbiology  None   Antimicrobials: None     Objective: Vitals:   09/02/19 0909 09/02/19 1707 09/02/19 2103 08/22/2019 0436  BP: (!) 164/66 (!) 152/72 (!) 177/83 120/72  Pulse: 72  74 87  Resp: 18 16 19 16   Temp:  (!) 97.5 F (36.4  C) 97.6 F (36.4 C) 98.8 F (37.1 C)  TempSrc:  Oral Oral Oral  SpO2: 96% 93% 100% 98%  Weight:      Height:        Intake/Output Summary (Last 24 hours) at 08/12/2019 1010 Last data filed at 08/11/2019 0547 Gross per 24 hour  Intake 1420.16 ml  Output 1000 ml  Net 420.16 ml   Filed Weights   09/01/2019 2133 08/31/19 2114  Weight: 58.4 kg 56.4 kg    Examination:  Constitutional: NAD Eyes: no icterus  ENMT: mmm Neck: normal, supple Respiratory: CTA biL, no wheezing, no crackles Cardiovascular: RRR, no MRG, no edema  Abdomen: soft, NT, ND, diminished  BS Musculoskeletal: no clubbing / cyanosis.  Skin: no rashes Neurologic: non focal   Data Reviewed: I have independently reviewed following labs and imaging studies   CBC: Recent Labs  Lab 08/18/2019 0507 08/31/19 0539 09/01/19 0453 09/02/19 0317 08/10/2019 0353  WBC 11.1* 11.1* 6.9 2.9* 5.5  NEUTROABS 9.5*  --  5.1  --   --   HGB 13.4 14.3 13.9 12.6 11.7*  HCT 39.5 42.3 41.6 38.6 35.1*  MCV 99.7 100.7* 102.5* 102.9* 101.4*  PLT 187 249 215 182 [id number]   Basic Metabolic Panel: Recent Labs  Lab 08/15/2019 1733 08/31/19 0539 09/01/19 0453 09/01/19 0805 09/02/19 0317 09/06/2019 0353  NA 133* 135 137  --  132* 136  K 3.9 3.8 3.2*  --  3.6 3.5  CL 100 101 101  --  97* 101  CO2 20* 23 25  --  24 25  GLUCOSE 144* 143* 133*  --  137* 133*  BUN 10 17 21   --  23 29*  CREATININE 0.78 0.63 0.58  --  0.54 0.75  CALCIUM 9.4 9.1 8.7*  --  8.2* 8.5*  MG  --   --   --  2.1  --   --    Liver Function Tests: Recent Labs  Lab 08/31/2019 0507 08/07/2019 1733 09/02/19 0317 08/28/2019 0353  AST 31 31 23 25   ALT 16 16 17 19   ALKPHOS 59 71 45 46  BILITOT 0.9 1.1 0.9 1.0  PROT 6.8 7.4 5.8* 5.9*  ALBUMIN 4.0 4.3 3.2* 3.1*   Coagulation Profile: No results for input(s): INR, PROTIME in the last 168 hours. HbA1C: No results for input(s): HGBA1C in the last 72 hours. CBG: No results for input(s): GLUCAP in the last 168 hours.  Recent Results (from the past 240 hour(s))  SARS Coronavirus 2 by RT PCR (hospital order, performed in [facility] hospital lab) Nasopharyngeal Nasopharyngeal Swab     Status: None   Collection Time: 08/27/2019  8:59 AM   Specimen: Nasopharyngeal Swab  Result Value Ref Range Status   SARS Coronavirus 2 NEGATIVE NEGATIVE Final    Comment: (NOTE) SARS-CoV-2 target nucleic acids are NOT DETECTED. The SARS-CoV-2 RNA is generally detectable in upper and lower respiratory specimens during the acute phase of infection. The lowest concentration of SARS-CoV-2 viral copies this  assay can detect is 250 copies / mL. A negative result does not preclude SARS-CoV-2 infection and should not be used as the sole basis for treatment or other patient management decisions.  A negative result may occur with improper specimen collection / handling, submission of specimen other than nasopharyngeal swab, presence of viral mutation(s) within the areas targeted by this assay, and inadequate number of viral copies (<250 copies / mL). A negative result must be combined with clinical observations, patient history, and  epidemiological information. Fact Sheet for Patients:   StrictlyIdeas.no Fact Sheet for Healthcare Providers: BankingDealers.co.za This test is not yet approved or cleared  by the [place] FDA and has been authorized for detection and/or diagnosis of SARS-CoV-2 by FDA under an Emergency Use Authorization (EUA).  This EUA will remain in effect (meaning this test can be used) for the duration of the COVID-19 declaration under Section 564(b)(1) of the Act, 21 U.S.C. section 360bbb-3(b)(1), unless the authorization is terminated or revoked sooner. Performed at [facility], [place] [address] [place], [place] [address]   Surgical pcr screen     Status: None   Collection Time: 09/04/2019  2:38 AM   Specimen: Nasal Mucosa; Nasal Swab  Result Value Ref Range Status   MRSA, PCR NEGATIVE NEGATIVE Final   Staphylococcus aureus NEGATIVE NEGATIVE Final    Comment: (NOTE) The Xpert SA Assay (FDA approved for NASAL specimens in patients 33 years of age and older), is one component of a comprehensive surveillance program. It is not intended to diagnose infection nor to guide or monitor treatment. Performed at [facility], [place] [address] [place], [place] [address]      Radiology Studies: ECHOCARDIOGRAM COMPLETE  Result Date: 09/02/2019    ECHOCARDIOGRAM REPORT   Patient Name:   SHYANA DECASTRO Date of Exam:  09/02/2019 Medical Rec #:  [id number]    Height:       61.0 in Accession #:    [id number]   Weight:       124.3 lb Date of Birth:  Jul 18, 1940    BSA:          1.543 m Patient Age:    27 years     BP:           164/66 mmHg Patient Gender: F            HR:           72 bpm. Exam Location:  Inpatient Procedure: 2D Echo, Cardiac Doppler and Color Doppler STAT ECHO Indications:    Abnormal ECG 794.31 / R94.31  History:        Patient has prior history of Echocardiogram examinations, most                 recent 05/20/2013. Cardiomegaly, CAD, Signs/Symptoms:Shortness of                 Breath; Risk Factors:Hypertension and Non-Smoker. PI. GERD.  Sonographer:    Vickie Epley RDCS Referring Phys: [place]  1. Left ventricular ejection fraction, by estimation, is 55 to 60%. The left ventricle has normal function. The left ventricle has no regional wall motion abnormalities. Left ventricular diastolic parameters are consistent with Grade I diastolic dysfunction (impaired relaxation).  2. Right ventricular systolic function is normal. The right ventricular size is normal. There is mildly elevated pulmonary artery systolic pressure.  3. The mitral valve is grossly normal. Trivial mitral valve regurgitation.  4. The aortic valve is tricuspid. Aortic valve regurgitation is not visualized. Mild aortic valve sclerosis is present, with no evidence of aortic valve stenosis.  5. The inferior vena cava is normal in size with greater than 50% respiratory variability, suggesting right atrial pressure of 3 mmHg. FINDINGS  Left Ventricle: Left ventricular ejection fraction, by estimation, is 55 to 60%. The left ventricle has normal function. The left ventricle has no regional wall motion abnormalities. The left ventricular internal cavity size was normal in size. There  is  no left ventricular hypertrophy. Left ventricular diastolic parameters are consistent with Grade I diastolic dysfunction (impaired relaxation).  Indeterminate filling pressures. Right Ventricle: The right ventricular size is normal. No increase in right ventricular wall thickness. Right ventricular systolic function is normal. There is mildly elevated pulmonary artery systolic pressure. The tricuspid regurgitant velocity is 2.88  m/s, and with an assumed right atrial pressure of 3 mmHg, the estimated right ventricular systolic pressure is 0000000 mmHg. Left Atrium: Left atrial size was normal in size. Right Atrium: Right atrial size was normal in size. Pericardium: There is no evidence of pericardial effusion. Mitral Valve: The mitral valve is grossly normal. Trivial mitral valve regurgitation. Tricuspid Valve: The tricuspid valve is grossly normal. Tricuspid valve regurgitation is trivial. Aortic Valve: The aortic valve is tricuspid. Aortic valve regurgitation is not visualized. Mild aortic valve sclerosis is present, with no evidence of aortic valve stenosis. Pulmonic Valve: The pulmonic valve was normal in structure. Pulmonic valve regurgitation is not visualized. Aorta: The aortic root and ascending aorta are structurally normal, with no evidence of dilitation. Venous: The inferior vena cava is normal in size with greater than 50% respiratory variability, suggesting right atrial pressure of 3 mmHg. IAS/Shunts: No atrial level shunt detected by color flow Doppler.  LEFT VENTRICLE PLAX 2D LVIDd:         4.58 cm     Diastology LVIDs:         3.28 cm     LV e' lateral:   8.16 cm/s LV PW:         0.92 cm     LV E/e' lateral: 7.4 LV IVS:        0.92 cm     LV e' medial:    5.55 cm/s LVOT diam:     1.70 cm     LV E/e' medial:  10.8 LV SV:         52 LV SV Index:   33 LVOT Area:     2.27 cm  LV Volumes (MOD) LV vol d, MOD A2C: 66.4 ml LV vol d, MOD A4C: 75.9 ml LV vol s, MOD A2C: 30.0 ml LV vol s, MOD A4C: 37.1 ml LV SV MOD A2C:     36.4 ml LV SV MOD A4C:     75.9 ml LV SV MOD BP:      38.6 ml RIGHT VENTRICLE RV S prime:     26.10 cm/s TAPSE (M-mode): 2.5 cm LEFT  ATRIUM             Index       RIGHT ATRIUM          Index LA diam:        4.80 cm 3.11 cm/m  RA Area:     7.66 cm LA Vol (A2C):   34.5 ml 22.35 ml/m RA Volume:   13.80 ml 8.94 ml/m LA Vol (A4C):   23.4 ml 15.16 ml/m LA Biplane Vol: 29.7 ml 19.24 ml/m  AORTIC VALVE LVOT Vmax:   109.00 cm/s LVOT Vmean:  71.100 cm/s LVOT VTI:    0.227 m  AORTA Ao Root diam: 3.40 cm MITRAL VALVE                TRICUSPID VALVE MV Area (PHT): 3.12 cm     TR Peak grad:   33.2 mmHg MV Decel Time: 243 msec     TR Vmax:        288.00 cm/s MV E velocity: 60.20  cm/s MV A velocity: 110.00 cm/s  SHUNTS MV E/A ratio:  0.55         Systemic VTI:  0.23 m                             Systemic Diam: 1.70 cm Lyman Bishop MD Electronically signed by Lyman Bishop MD Signature Date/Time: 09/02/2019/10:30:16 AM    Final     Marzetta Board, MD, PhD [facility]  Between 7 am - 7 pm I am available, please contact me via Amion or Securechat  Between 7 pm - 7 am I am not available, please contact night coverage MD/APP via Amion

## 2019-09-03 NOTE — Consult Note (Signed)
NAME:  [name], MRN:  [id number], DOB:  25-Aug-1940, LOS: 4 ADMISSION DATE:  08/16/2019, CONSULTATION DATE:  08/31/2019 REFERRING MD: Jonny Ruiz MD, CHIEF COMPLAINT: Small bowel obstruction s/p ex lap, vent management  Brief History   79 year old with nonobstructive CAD, HLD, IBS, depression admitted with small bowel obstruction.  Failed conservative management and taken to exploratory laparotomy today with findings of ischemic bowel with perforation underwent resection Left on the ventilator with open abdomen and transferred to ICU.  PCCM consulted for vent management  Past Medical History    has a past medical history of Abdominal bruit, Abnormal weight gain, Allergic rhinitis, cause unspecified, CAD (coronary artery disease), Colon polyp (0000000), Complication of anesthesia, Depression, Diverticular disease, Fibromyalgia, GERD (gastroesophageal reflux disease), Hyperlipemia, IBS (irritable bowel syndrome), Migraine, unspecified, without mention of intractable migraine without mention of status migrainosus, Tremor, Unspecified adverse effect of unspecified drug, medicinal and biological substance, and Ventral incisional hernia (06/04/2015).  Significant Hospital Events   5/24- Admit 5/28- Ex lap, bowel resection  Consults:  Surgery  Procedures:    Significant Diagnostic Tests:  CT abdomen pelvis 08/19/2019-mid small bowel obstruction with 2 transition zones.  Visualized lung bases clear. I have reviewed the images personally  Echocardiogram 09/02/2019-LVEF 0000000, grade 1 diastolic dysfunction, mildly elevated PA systolic pressure.   Micro Data:    Antimicrobials:  Cefazolin 5/28 surgical prophylaxis Meropenem 5/28 >>  Interim history/subjective:    Objective   Blood pressure 120/72, pulse 87, temperature 98.8 F (37.1 C), temperature source Oral, resp. rate 16, height 5\' 1"  (1.549 m), weight 56.4 kg, SpO2 98 %.        Intake/Output Summary (Last 24 hours) at 09/04/2019  1227 Last data filed at 08/31/2019 1221 Gross per 24 hour  Intake 3920.16 ml  Output 1125 ml  Net 2795.16 ml   Filed Weights   08/17/2019 2133 08/31/19 2114  Weight: 58.4 kg 56.4 kg    Examination: Gen:      No acute distress HEENT:  EOMI, sclera anicteric Neck:     No masses; no thyromegaly, ETT Lungs:    Clear to auscultation bilaterally; normal respiratory effort CV:         Regular rate and rhythm; no murmurs Abd:      Wound VAC, diminished bowel sounds Ext:    No edema; adequate peripheral perfusion Skin:      Warm and dry; no rash Neuro: [facility] Problem list     Assessment & Plan:  Acute small bowel obstruction with ischemia, perforation S/p ex lap and resection Remains on the ventilator with open abdomen Starting meropenem Follow cultures Return to the OR tomorrow  Acute hypoxic respiratory failure secondary to bowel ischemia, postop status Continue vent support Follow chest x-ray, ABG Keep well sedated No plans on weaning until abdomen is closed.  Sepsis, present on admission.  Unknown organism Septic shock Wean off neofirst and then Levophed IV fluid hydration  Hypertension Holding blood pressure medication  Hypoglycemia 1 amp dextrose May need dextrose drip if blood pressure remains low.  Best practice:  Diet: NPO Pain/Anxiety/Delirium protocol (if indicated): Fentanyl, propofol, versed PRN, RASS goal - 4 VAP protocol (if indicated): Ordered DVT prophylaxis: Lovenox GI prophylaxis: PPI Glucose control: Monitor Mobility: Bed Code Status: Full Family Communication: Per Primary Disposition: ICU  Labs   CBC: Recent Labs  Lab 08/17/2019 0507 08/31/19 0539 09/01/19 0453 09/02/19 0317 08/19/2019 0353  WBC 11.1* 11.1* 6.9 2.9* 5.5  NEUTROABS 9.5*  --  5.1  --   --   HGB 13.4 14.3 13.9 12.6 11.7*  HCT 39.5 42.3 41.6 38.6 35.1*  MCV 99.7 100.7* 102.5* 102.9* 101.4*  PLT 187 249 215 182 [id number]    Basic Metabolic Panel: Recent  Labs  Lab 09/04/2019 1733 08/31/19 0539 09/01/19 0453 09/01/19 0805 09/02/19 0317 08/31/2019 0353  NA 133* 135 137  --  132* 136  K 3.9 3.8 3.2*  --  3.6 3.5  CL 100 101 101  --  97* 101  CO2 20* 23 25  --  24 25  GLUCOSE 144* 143* 133*  --  137* 133*  BUN 10 17 21   --  23 29*  CREATININE 0.78 0.63 0.58  --  0.54 0.75  CALCIUM 9.4 9.1 8.7*  --  8.2* 8.5*  MG  --   --   --  2.1  --   --    GFR: Estimated Creatinine Clearance: 43.7 mL/min (by C-G formula based on SCr of 0.75 mg/dL). Recent Labs  Lab 08/31/19 0539 09/01/19 0453 09/02/19 0317 08/19/2019 0353  WBC 11.1* 6.9 2.9* 5.5    Liver Function Tests: Recent Labs  Lab 08/08/2019 0507 08/07/2019 1733 09/02/19 0317 09/04/2019 0353  AST 31 31 23 25   ALT 16 16 17 19   ALKPHOS 59 71 45 46  BILITOT 0.9 1.1 0.9 1.0  PROT 6.8 7.4 5.8* 5.9*  ALBUMIN 4.0 4.3 3.2* 3.1*   Recent Labs  Lab 08/21/2019 0507  LIPASE 20   No results for input(s): AMMONIA in the last 168 hours.  ABG    Component Value Date/Time   PHART 7.404 02/28/2012 2255   PCO2ART 42.5 02/28/2012 2255   PO2ART 82.9 02/28/2012 2255   HCO3 26.0 (H) 02/28/2012 2255   TCO2 23.6 02/28/2012 2255   O2SAT 96.9 02/28/2012 2255     Coagulation Profile: No results for input(s): INR, PROTIME in the last 168 hours.  Cardiac Enzymes: No results for input(s): CKTOTAL, CKMB, CKMBINDEX, TROPONINI in the last 168 hours.  HbA1C: Hgb A1c MFr Bld  Date/Time Value Ref Range Status  06/07/2016 09:06 AM 5.7 4.6 - 6.5 % Final    Comment:    Glycemic Control Guidelines for People with Diabetes:Non Diabetic:  <6%Goal of Therapy: <7%Additional Action Suggested:  >8%   06/02/2015 10:10 AM 5.5 4.6 - 6.5 % Final    Comment:    Glycemic Control Guidelines for People with Diabetes:Non Diabetic:  <6%Goal of Therapy: <7%Additional Action Suggested:  >8%     CBG: No results for input(s): GLUCAP in the last 168 hours.  Review of Systems:   Unable to obtain due to sedation, altered  mental status  Past Medical History  She,  has a past medical history of Abdominal bruit, Abnormal weight gain, Allergic rhinitis, cause unspecified, CAD (coronary artery disease), Colon polyp (0000000), Complication of anesthesia, Depression, Diverticular disease, Fibromyalgia, GERD (gastroesophageal reflux disease), Hyperlipemia, IBS (irritable bowel syndrome), Migraine, unspecified, without mention of intractable migraine without mention of status migrainosus, Tremor, Unspecified adverse effect of unspecified drug, medicinal and biological substance, and Ventral incisional hernia (06/04/2015).   Surgical History    Past Surgical History:  Procedure Laterality Date  . ABDOMINAL HYSTERECTOMY  1979   partial  . ABDOMINAL SURGERY      @ [facility]  in [place] in 2002  for ptosis of organs; mersilene mesh sling, cystocopy  by Dr  Rosana Hoes, Urologist  . BLADDER SURGERY  patient unsure of date   sling  . CATARACT  EXTRACTION     left eye  . COLONOSCOPY  2011   Diverticulosis & hemorrhoids; Dr Olevia Perches  . LAPAROSCOPIC SIGMOID COLECTOMY  02/26/2012   Procedure: LAPAROSCOPIC SIGMOID COLECTOMY;  Surgeon: Adin Hector, MD;  Location: [facility];  Service: General;  Laterality: N/A;  Laparoscopic Assisted Sigmoid Colectomy  . PARTIAL COLECTOMY  02/26/2012   Procedure: PARTIAL COLECTOMY;  Surgeon: Adin Hector, MD;  Location: [facility];  Service: General;  Laterality: N/A;  Sigmoid Colectomy  . PARTIAL HYSTERECTOMY    . TONSILLECTOMY  as child  . [place]     Social History   reports that she has never smoked. She has never used smokeless tobacco. She reports that she does not drink alcohol or use drugs.   Family History   Her family history includes Alcohol abuse in her mother; Bipolar disorder in her sister; Heart attack (age of onset: 49) in her sister; Heart attack (age of onset: 15) in her brother; Heart attack (age of onset: 70) in her father; Lung cancer in her father; Lymphoma in  her sister; Mental illness in her son; Other in her brother; Stroke in her sister; Ulcers in her father. There is no history of Colon cancer.   Allergies Allergies  Allergen Reactions  . Amphetamine-Dextroamphet Er Other (See Comments)    rash  . Hydromorphone Shortness Of Breath    BREATHING AND CARDIAC ISSUES  . Metronidazole Other (See Comments)    Unknown REACTION: SORES IN MOUTH, severe diarrhea  . Penicillins     RASH  . Thorazine [Chlorpromazine] Other (See Comments)    " had a total hepatic wipeout" in 1974  . Bupivacaine Other (See Comments)    Unknown  . Fentanyl Other (See Comments)    Mental issues  . Other Other (See Comments)    perservatives- GI problems; swelling; blood infection  . Atorvastatin Other (See Comments)    Upset stomach  . Azithromycin     Unknown reaction  . Brinzolamide-Brimonidine Dermatitis and Other (See Comments)    Shortness of Breath, irritation   . Cephalexin     Unknown reaction  . Chlorpromazine Hcl     Unknown reaction  . Ciprofloxacin     REACTION: SEVERE PAIN,DEHYDRATION,DIARRHEA  . Diamox [Acetazolamide] Other (See Comments)    Causes HBP  . Doxycycline Diarrhea  . Ezetimibe     REACTION: JOINT/MUSCLE ACHES  . Gentian Violet Other (See Comments)  . Hyoscyamine Sulfate     Unknown reaction  . Lactose Intolerance (Gi) Diarrhea  . Lidocaine Swelling  . Oatmeal Other (See Comments)     Sinus Congestion   . Sulfamethoxazole     REACTION: GI upset-acid reflux  . Telithromycin     Unknown reaction  . Topiramate     Unknown reaction  . Lactose Diarrhea  . Latex Rash     Home Medications  Prior to Admission medications   Medication Sig Start Date End Date Taking? Authorizing Provider  ascorbic acid (VITAMIN C) 500 MG tablet Take 500 mg by mouth daily.   Yes [provider]  aspirin EC 81 MG tablet Take 81 mg by mouth daily.  03/21/16  Yes Larey Dresser, MD  Biotin 5 MG TABS Take 5 mg by mouth daily.    Yes  [provider]  Calcium Carbonate (CALTRATE 600 PO) Take 2 tablets by mouth 2 (two) times daily.    Yes [provider]  Cholecalciferol (VITAMIN D3) 50  MCG (2000 UT) capsule Take 2,000 Units by mouth daily.    Yes [provider]  cloNIDine (CATAPRES) 0.1 MG tablet TAKE 1 TABLET IN THE MORNING. Patient taking differently: Take 0.1 mg by mouth daily.  05/04/19  Yes Cottle, Billey Co., MD  Coenzyme Q10 (COQ10) 100 MG CAPS Take 1 capsule by mouth daily.    Yes [provider]  donepezil (ARICEPT) 10 MG tablet TAKE 2 TABLETS DAILY. Patient taking differently: Take 20 mg by mouth daily.  10/26/18  Yes Cottle, Billey Co., MD  esomeprazole (NEXIUM) 40 MG capsule TAKE 1 CAPSULE IN THE MORNING BEFORE BREAKFAST. Patient taking differently: Take 40 mg by mouth daily. TAKE 1 CAPSULE IN THE MORNING BEFORE BREAKFAST. 08/17/19  Yes Burns, Claudina Lick, MD  fluticasone (FLONASE) 50 MCG/ACT nasal spray Place 1 spray into both nostrils daily.   Yes [provider]  Ginkgo Biloba 40 MG TABS Take 40 mg by mouth daily.    Yes [provider]  loratadine (CLARITIN) 10 MG tablet Take 10 mg by mouth daily.   Yes [provider]  metroNIDAZOLE (METROCREAM) 0.75 % cream Apply 1 application topically 2 (two) times daily. 07/13/19  Yes [provider]  Multiple Vitamins-Minerals (CENTRUM SILVER PO) Take 1 tablet by mouth daily.    Yes [provider]  naproxen sodium (RA NAPROXEN SODIUM) 220 MG tablet Take 220 mg by mouth 2 (two) times daily with a meal.    Yes [provider]  oxymetazoline (AFRIN) 0.05 % nasal spray Place 1 spray into both nostrils as needed for congestion.   Yes [provider]  Potassium (POTASSIMIN PO) Take 99 mg by mouth daily.    Yes [provider]  psyllium (METAMUCIL) 0.52 g capsule Take 4 capsules by mouth as needed (constipation). 4 by mouth daily prn   Yes [provider]  QUEtiapine  Fumarate (SEROQUEL XR) 150 MG 24 hr tablet Take 1 tablet (150 mg total) by mouth at bedtime. 03/17/19  Yes Cottle, Billey Co., MD  rosuvastatin (CRESTOR) 20 MG tablet Take 1 tablet (20 mg total) by mouth at bedtime. Please call for OV [id number] 05/13/19  Yes Larey Dresser, MD  Simethicone (GAS-X PO) Take 1 tablet by mouth daily as needed (gerd).    Yes [provider]  Tafluprost (ZIOPTAN OP) Place 1 drop into both eyes daily.    Yes [provider]  zinc gluconate 50 MG tablet Take 50 mg by mouth daily.   Yes [provider]     Critical care time:    The patient is critically ill with multiple organ system failure and requires high complexity decision making for assessment and support, frequent evaluation and titration of therapies, advanced monitoring, review of radiographic studies and interpretation of complex data.   Critical Care Time devoted to patient care services, exclusive of separately billable procedures, described in this note is 35 minutes.   Marshell Garfinkel MD [facility] Please see [email or web address] for pager details.  08/24/2019, 12:49 PM

## 2019-09-03 NOTE — Plan of Care (Signed)
  Problem: Activity: Goal: Risk for activity intolerance will decrease Outcome: Progressing   

## 2019-09-03 NOTE — Progress Notes (Addendum)
[facility] Progress Note  Day of Surgery  Subjective: CC:  Abdominal pressure overnight, NG tube was accidentally pulled out and had to be replaced. Denies flatus or BM. States toradol helped her get some rest. Daughter at bedside.  Na 136, K 3.5 Objective: Vital signs in last 24 hours: Temp:  [97.5 F (36.4 C)-98.8 F (37.1 C)] 98.8 F (37.1 C) (05/28 0436) Pulse Rate:  [74-87] 87 (05/28 0436) Resp:  [16-19] 16 (05/28 0436) BP: (120-177)/(72-83) 120/72 (05/28 0436) SpO2:  [93 %-100 %] 98 % (05/28 0436) Last BM Date: 08/31/2019  Intake/Output from previous day: 05/27 0701 - 05/28 0700 In: 1420.2 [I.V.:1420.2] Out: 1000 [Urine:200; Emesis/NG output:800] Intake/Output this shift: No intake/output data recorded.  PE: Gen:  Alert, anxious appearing Card:  Regular rate and rhythm Pulm:  Normal effort Abd: Soft, mild TTP, distended, previous laparotomy scar appears well-healed    NG - 800 cc/24h light brown overnight Skin: warm and dry, no rashes  Psych: A&Ox3   Lab Results:  Recent Labs    09/02/19 0317 08/23/2019 0353  WBC 2.9* 5.5  HGB 12.6 11.7*  HCT 38.6 35.1*  PLT 182 206   BMET Recent Labs    09/02/19 0317 09/06/2019 0353  NA 132* 136  K 3.6 3.5  CL 97* 101  CO2 24 25  GLUCOSE 137* 133*  BUN 23 29*  CREATININE 0.54 0.75  CALCIUM 8.2* 8.5*   PT/INR No results for input(s): LABPROT, INR in the last 72 hours. CMP     Component Value Date/Time   NA 136 08/28/2019 0353   K 3.5 09/01/2019 0353   CL 101 09/02/2019 0353   CO2 25 09/04/2019 0353   GLUCOSE 133 (H) 09/04/2019 0353   BUN 29 (H) 08/12/2019 0353   CREATININE 0.75 08/25/2019 0353   CALCIUM 8.5 (L) 08/17/2019 0353   PROT 5.9 (L) 08/07/2019 0353   ALBUMIN 3.1 (L) 08/13/2019 0353   AST 25 08/11/2019 0353   ALT 19 08/11/2019 0353   ALKPHOS 46 08/28/2019 0353   BILITOT 1.0 09/02/2019 0353   GFRNONAA >60 08/29/2019 0353   GFRAA >60 09/03/2019 0353   Lipase     Component Value  Date/Time   LIPASE 20 08/19/2019 0507       Studies/Results: DG Abd Portable 1V  Result Date: 09/02/2019 CLINICAL DATA:  Small-bowel obstruction EXAM: PORTABLE ABDOMEN - 1 VIEW COMPARISON:  Portable exam 0813 hours compared to 09/01/2019 FINDINGS: Persistent gaseous distension of small bowel loops. Small amount of gas and stool in RIGHT colon. Findings remain consistent with small bowel obstruction. No definite bowel wall thickening. Tip of nasogastric tube projects over stomach. Degenerative disc and facet disease changes at lower lumbar spine with osseous demineralization. IMPRESSION: Persistent small bowel obstruction. Electronically Signed   By: Lavonia Dana M.D.   On: 09/02/2019 08:50   ECHOCARDIOGRAM COMPLETE  Result Date: 09/02/2019    ECHOCARDIOGRAM REPORT   Patient Name:   JEANTTE WIBBENMEYER Date of Exam: 09/02/2019 Medical Rec #:  [id number]    Height:       61.0 in Accession #:    [id number]   Weight:       124.3 lb Date of Birth:  07/19/1940    BSA:          1.543 m Patient Age:    79 years     BP:           164/66 mmHg Patient Gender: F  HR:           72 bpm. Exam Location:  Inpatient Procedure: 2D Echo, Cardiac Doppler and Color Doppler STAT ECHO Indications:    Abnormal ECG 794.31 / R94.31  History:        Patient has prior history of Echocardiogram examinations, most                 recent 05/20/2013. Cardiomegaly, CAD, Signs/Symptoms:Shortness of                 Breath; Risk Factors:Hypertension and Non-Smoker. PI. GERD.  Sonographer:    Vickie Epley RDCS Referring Phys: [place]  1. Left ventricular ejection fraction, by estimation, is 55 to 60%. The left ventricle has normal function. The left ventricle has no regional wall motion abnormalities. Left ventricular diastolic parameters are consistent with Grade I diastolic dysfunction (impaired relaxation).  2. Right ventricular systolic function is normal. The right ventricular size is normal. There is mildly  elevated pulmonary artery systolic pressure.  3. The mitral valve is grossly normal. Trivial mitral valve regurgitation.  4. The aortic valve is tricuspid. Aortic valve regurgitation is not visualized. Mild aortic valve sclerosis is present, with no evidence of aortic valve stenosis.  5. The inferior vena cava is normal in size with greater than 50% respiratory variability, suggesting right atrial pressure of 3 mmHg. FINDINGS  Left Ventricle: Left ventricular ejection fraction, by estimation, is 55 to 60%. The left ventricle has normal function. The left ventricle has no regional wall motion abnormalities. The left ventricular internal cavity size was normal in size. There is  no left ventricular hypertrophy. Left ventricular diastolic parameters are consistent with Grade I diastolic dysfunction (impaired relaxation). Indeterminate filling pressures. Right Ventricle: The right ventricular size is normal. No increase in right ventricular wall thickness. Right ventricular systolic function is normal. There is mildly elevated pulmonary artery systolic pressure. The tricuspid regurgitant velocity is 2.88  m/s, and with an assumed right atrial pressure of 3 mmHg, the estimated right ventricular systolic pressure is 0000000 mmHg. Left Atrium: Left atrial size was normal in size. Right Atrium: Right atrial size was normal in size. Pericardium: There is no evidence of pericardial effusion. Mitral Valve: The mitral valve is grossly normal. Trivial mitral valve regurgitation. Tricuspid Valve: The tricuspid valve is grossly normal. Tricuspid valve regurgitation is trivial. Aortic Valve: The aortic valve is tricuspid. Aortic valve regurgitation is not visualized. Mild aortic valve sclerosis is present, with no evidence of aortic valve stenosis. Pulmonic Valve: The pulmonic valve was normal in structure. Pulmonic valve regurgitation is not visualized. Aorta: The aortic root and ascending aorta are structurally normal, with no  evidence of dilitation. Venous: The inferior vena cava is normal in size with greater than 50% respiratory variability, suggesting right atrial pressure of 3 mmHg. IAS/Shunts: No atrial level shunt detected by color flow Doppler.  LEFT VENTRICLE PLAX 2D LVIDd:         4.58 cm     Diastology LVIDs:         3.28 cm     LV e' lateral:   8.16 cm/s LV PW:         0.92 cm     LV E/e' lateral: 7.4 LV IVS:        0.92 cm     LV e' medial:    5.55 cm/s LVOT diam:     1.70 cm     LV E/e' medial:  10.8 LV SV:  52 LV SV Index:   33 LVOT Area:     2.27 cm  LV Volumes (MOD) LV vol d, MOD A2C: 66.4 ml LV vol d, MOD A4C: 75.9 ml LV vol s, MOD A2C: 30.0 ml LV vol s, MOD A4C: 37.1 ml LV SV MOD A2C:     36.4 ml LV SV MOD A4C:     75.9 ml LV SV MOD BP:      38.6 ml RIGHT VENTRICLE RV S prime:     26.10 cm/s TAPSE (M-mode): 2.5 cm LEFT ATRIUM             Index       RIGHT ATRIUM          Index LA diam:        4.80 cm 3.11 cm/m  RA Area:     7.66 cm LA Vol (A2C):   34.5 ml 22.35 ml/m RA Volume:   13.80 ml 8.94 ml/m LA Vol (A4C):   23.4 ml 15.16 ml/m LA Biplane Vol: 29.7 ml 19.24 ml/m  AORTIC VALVE LVOT Vmax:   109.00 cm/s LVOT Vmean:  71.100 cm/s LVOT VTI:    0.227 m  AORTA Ao Root diam: 3.40 cm MITRAL VALVE                TRICUSPID VALVE MV Area (PHT): 3.12 cm     TR Peak grad:   33.2 mmHg MV Decel Time: 243 msec     TR Vmax:        288.00 cm/s MV E velocity: 60.20 cm/s MV A velocity: 110.00 cm/s  SHUNTS MV E/A ratio:  0.55         Systemic VTI:  0.23 m                             Systemic Diam: 1.70 cm Lyman Bishop MD Electronically signed by Lyman Bishop MD Signature Date/Time: 09/02/2019/10:30:16 AM    Final     Anti-infectives: Anti-infectives (From admission, onward)   Start     Dose/Rate Route Frequency Ordered Stop   08/31/2019 0600  [MAR Hold]  ceFAZolin (ANCEF) IVPB 2g/100 mL premix     (MAR Hold since Fri 08/17/2019 at 0851.Hold Reason: Transfer to a Procedural area.)   2 g 200 mL/hr over 30 Minutes  Intravenous On call to O.R. 09/02/19 1512 09/04/19 0559      Assessment/Plan HTN HLD CAD, MI 1995 GERD Hypertensive urgency - per primary  SBO, likely secondary to adhesions - CT with two possible transition points in mid abdomen; WBC 6.9 and non-tender abdominal exam. Low suspicion for closed loop - having some flatus, No BM - No clinical improvement. To OR today for exploratory laparotomy, lysis of adhesions, possible bowel resection.  - echo with LVEF 55-60% and no wall motion abnormalities. Walks daily without chest pain or dyspnea.   ID - none VTE - SCDs, lovenox FEN - IVF, NPO/NGT to LIWS Foley - none Follow up - TBD   LOS: 4 days   Obie Dredge, [facility] Please see Amion for pager number during day hours 7:00am-4:30pm

## 2019-09-03 NOTE — Progress Notes (Signed)
PHARMACY NOTE:  ANTIMICROBIAL RENAL DOSAGE ADJUSTMENT  Current antimicrobial regimen includes a mismatch between antimicrobial dosage and estimated renal function.  As per policy approved by the Pharmacy & Therapeutics and Medical Executive Committees, the antimicrobial dosage will be adjusted accordingly.  Current antimicrobial dosage:  Meropenem 2 gm IV Q 12 hrs  Indication:Intraabdominal Infection  Renal Function:  Estimated Creatinine Clearance: 19.4 mL/min (A) (by C-G formula based on SCr of 1.8 mg/dL (H)). []      On intermittent HD, scheduled: []      On CRRT    Antimicrobial dosage has been changed to Meropenem 1 gm IV Q 12 hrs  Additional comments: Pharmacy will continue to monitor pt's renal function and adjust meropenem regimen as needed.  Thank you for allowing pharmacy to be a part of this patient's care.  Gillermina Hu, PharmD, BCPS, [facility] Clinical Pharmacist 08/26/2019 9:48 PM

## 2019-09-03 NOTE — Anesthesia Procedure Notes (Signed)
Arterial Line Insertion Start/End05/21/2021 10:02 AM, 09/03/2019 10:12 AM Performed by: Verdie Drown, CRNA, CRNA  Preanesthetic checklist: patient identified, IV checked, site marked, risks and benefits discussed, surgical consent, monitors and equipment checked and pre-op evaluation Patient sedated Left, radial was placed Catheter size: 20 G Hand hygiene performed , maximum sterile barriers used  and Seldinger technique used Allen's test indicative of satisfactory collateral circulation Attempts: 1 Procedure performed without using ultrasound guided technique. Following insertion, Biopatch and dressing applied. Post procedure assessment: normal  Patient tolerated the procedure well with no immediate complications.

## 2019-09-03 NOTE — Anesthesia Postprocedure Evaluation (Signed)
Anesthesia Post Note  Patient: [name]  Procedure(s) Performed: EXPLORATORY LAPAROTOMY (N/A Abdomen) SMALL BOWEL RESECTION (N/A Abdomen) Lysis Of Adhesion (N/A Abdomen) Application Of Wound Vac (N/A Abdomen)     Patient location during evaluation: SICU Anesthesia Type: General Level of consciousness: sedated Pain management: pain level controlled Vital Signs Assessment: post-procedure vital signs reviewed and stable Respiratory status: patient remains intubated per anesthesia plan Cardiovascular status: stable Postop Assessment: no apparent nausea or vomiting Anesthetic complications: no    Last Vitals:  Vitals:   08/08/2019 1400 08/19/2019 1500  BP: 95/74   Pulse:    Resp: 20   Temp:  36.7 C  SpO2:      Last Pain:  Vitals:   08/15/2019 1500  TempSrc: Axillary  PainSc:                  Effie Berkshire

## 2019-09-03 NOTE — Progress Notes (Signed)
ABG results given to Dr. Vaughan Browner:  PH 7.37 PCO2 36.8 PO2 247 HCO3 21.5 SO2 100%  Pt decreased to 40%. No new orders at this time. RT will continue to monitor.

## 2019-09-03 NOTE — Progress Notes (Signed)
ABG results given to Dr. Vaughan Browner,  PH 7.31 PCO2 51.2 PO2 557 HCO3 25.8 SO2 100%.  Verbal order received to decrease pt VT to 6cc/kg and to increase RR to 35. RT will continue to monitor.

## 2019-09-03 NOTE — Anesthesia Procedure Notes (Signed)
Central Venous Catheter Insertion Performed by: Effie Berkshire, MD, anesthesiologist Start/End05/17/2021 9:50 AM, 09/03/2019 9:56 AM Patient location: Pre-op. Preanesthetic checklist: patient identified, IV checked, site marked, risks and benefits discussed, surgical consent, monitors and equipment checked, pre-op evaluation, timeout performed and anesthesia consent Position: Trendelenburg Lidocaine 1% used for infiltration and patient sedated Hand hygiene performed , maximum sterile barriers used  and Seldinger technique used Catheter size: 8 Fr Total catheter length 16. Central line was placed.Double lumen Procedure performed using ultrasound guided technique. Ultrasound Notes:anatomy identified, needle tip was noted to be adjacent to the nerve/plexus identified, no ultrasound evidence of intravascular and/or intraneural injection and image(s) printed for medical record Attempts: 1 Following insertion, dressing applied, line sutured and Biopatch. Post procedure assessment: blood return through all ports  Patient tolerated the procedure well with no immediate complications.

## 2019-09-03 NOTE — Progress Notes (Signed)
Nutrition Follow-up  DOCUMENTATION CODES:   Not applicable  INTERVENTION:   Recommend begin enteral nutrition as able. Vital AF 1.2 at 45 ml/h would provide 1296 kcal, 81 gm protein, 876 ml free water daily.  If unable to begin enteral nutrition within the next 24-48 hours, recommend TPN.   NUTRITION DIAGNOSIS:   Inadequate oral intake related to inability to eat as evidenced by NPO status.  Ongoing   GOAL:   Patient will meet greater than or equal to 90% of their needs  Unmet  MONITOR:   Diet advancement, Skin, Weight trends, Labs, I & O's  REASON FOR ASSESSMENT:   Ventilator    ASSESSMENT:   Pt with a PMH significant for HLD, nonobstructive CAD, IBS, depression, fibromyalgia, h/o ventral hernia repair, and h/o partial colectomy d/t recurrent diverticulitis presented with abdominal pain and was admitted with SBO.  S/P ex lap with resection of ileum and cecum with primary anastomosis and placement of ABThera abdominal VAC today for necrotic ileum to the level of the ileocecal valve. Approximately 40 cm of ileum was resected. Patient is at risk of developing B-12 deficiency with resected ileocecal valve.   Plans to remain intubated with return to the OR in the next 24-48 hours to evaluate the anastomosis.   Patient is currently intubated on ventilator support. MV: 8.6 L/min Temp (24hrs), Avg:98 F (36.7 C), Min:97.5 F (36.4 C), Max:98.8 F (37.1 C)   Labs reviewed. K 3.2 (L) CBG: 34-95  Medications reviewed and include levophed.  Patient has been NPO since admission (4 days).    Diet Order:   Diet Order            Diet NPO time specified  Diet effective now              EDUCATION NEEDS:   No education needs have been identified at this time  Skin:   Wound Vac: open abdomen s/p ex lap  Last BM:  5/24  Height:   Ht Readings from Last 1 Encounters:  08/31/19 5\' 1"  (1.549 m)    Weight:   Wt Readings from Last 1 Encounters:  08/31/19  56.4 kg    BMI:  Body mass index is 23.49 kg/m.  Estimated Nutritional Needs:   Kcal:  1200  Protein:  85-100 gm  Fluid:  >/= 1.5 L    Lucas Mallow, RD, LDN, CNSC Please refer to Amion for contact information.

## 2019-09-04 ENCOUNTER — Inpatient Hospital Stay (HOSPITAL_COMMUNITY): Payer: Medicare Other

## 2019-09-04 ENCOUNTER — Encounter (HOSPITAL_COMMUNITY): Payer: Self-pay | Admitting: Anesthesiology

## 2019-09-04 DIAGNOSIS — N179 Acute kidney failure, unspecified: Secondary | ICD-10-CM

## 2019-09-04 LAB — BASIC METABOLIC PANEL
Anion gap: 13 (ref 5–15)
Anion gap: 11 (ref 5–15)
Anion gap: 15 (ref 5–15)
BUN: 45 mg/dL — ABNORMAL HIGH (ref 8–23)
BUN: 49 mg/dL — ABNORMAL HIGH (ref 8–23)
BUN: 55 mg/dL — ABNORMAL HIGH (ref 8–23)
CO2: 17 mmol/L — ABNORMAL LOW (ref 22–32)
CO2: 20 mmol/L — ABNORMAL LOW (ref 22–32)
CO2: 16 mmol/L — ABNORMAL LOW (ref 22–32)
Calcium: 7.6 mg/dL — ABNORMAL LOW (ref 8.9–10.3)
Calcium: 7.5 mg/dL — ABNORMAL LOW (ref 8.9–10.3)
Calcium: 7.1 mg/dL — ABNORMAL LOW (ref 8.9–10.3)
Chloride: 105 mmol/L (ref 98–111)
Chloride: 107 mmol/L (ref 98–111)
Chloride: 106 mmol/L (ref 98–111)
Creatinine, Ser: 2.33 mg/dL — ABNORMAL HIGH (ref 0.44–1.00)
Creatinine, Ser: 2.6 mg/dL — ABNORMAL HIGH (ref 0.44–1.00)
Creatinine, Ser: 3.16 mg/dL — ABNORMAL HIGH (ref 0.44–1.00)
GFR calc Af Amer: 22 mL/min — ABNORMAL LOW (ref >60)
GFR calc Af Amer: 20 mL/min — ABNORMAL LOW (ref >60)
GFR calc Af Amer: 16 mL/min — ABNORMAL LOW (ref >60)
GFR calc non Af Amer: 19 mL/min — ABNORMAL LOW (ref >60)
GFR calc non Af Amer: 17 mL/min — ABNORMAL LOW (ref >60)
GFR calc non Af Amer: 13 mL/min — ABNORMAL LOW (ref >60)
Glucose, Bld: 97 mg/dL (ref 70–99)
Glucose, Bld: 83 mg/dL (ref 70–99)
Glucose, Bld: 127 mg/dL — ABNORMAL HIGH (ref 70–99)
Potassium: 5.2 mmol/L — ABNORMAL HIGH (ref 3.5–5.1)
Potassium: 5.8 mmol/L — ABNORMAL HIGH (ref 3.5–5.1)
Potassium: 6.4 mmol/L (ref 3.5–5.1)
Sodium: 135 mmol/L (ref 135–145)
Sodium: 138 mmol/L (ref 135–145)
Sodium: 137 mmol/L (ref 135–145)

## 2019-09-04 LAB — CBC WITH DIFFERENTIAL/PLATELET
Abs Immature Granulocytes: 0 10*3/uL (ref 0.00–0.07)
Basophils Absolute: 0 10*3/uL (ref 0.0–0.1)
Basophils Relative: 0 %
Eosinophils Absolute: 0 10*3/uL (ref 0.0–0.5)
Eosinophils Relative: 0 %
HCT: 25.1 % — ABNORMAL LOW (ref 36.0–46.0)
Hemoglobin: 7.9 g/dL — ABNORMAL LOW (ref 12.0–15.0)
Lymphocytes Relative: 13 %
Lymphs Abs: 1.7 10*3/uL (ref 0.7–4.0)
MCH: 33.9 pg (ref 26.0–34.0)
MCHC: 31.5 g/dL (ref 30.0–36.0)
MCV: 107.7 fL — ABNORMAL HIGH (ref 80.0–100.0)
Monocytes Absolute: 0.4 10*3/uL (ref 0.1–1.0)
Monocytes Relative: 3 %
Neutro Abs: 11 10*3/uL — ABNORMAL HIGH (ref 1.7–7.7)
Neutrophils Relative %: 84 %
Platelets: 129 10*3/uL — ABNORMAL LOW (ref 150–400)
RBC: 2.33 MIL/uL — ABNORMAL LOW (ref 3.87–5.11)
RDW: 12.5 % (ref 11.5–15.5)
WBC: 13.1 10*3/uL — ABNORMAL HIGH (ref 4.0–10.5)
nRBC: 1.4 % — ABNORMAL HIGH (ref 0.0–0.2)
nRBC: 4 /100 WBC — ABNORMAL HIGH

## 2019-09-04 LAB — POCT I-STAT 7, (LYTES, BLD GAS, ICA,H+H)
Acid-base deficit: 6 mmol/L — ABNORMAL HIGH (ref 0.0–2.0)
Acid-base deficit: 7 mmol/L — ABNORMAL HIGH (ref 0.0–2.0)
Bicarbonate: 19.3 mmol/L — ABNORMAL LOW (ref 20.0–28.0)
Bicarbonate: 18.8 mmol/L — ABNORMAL LOW (ref 20.0–28.0)
Calcium, Ion: 1.08 mmol/L — ABNORMAL LOW (ref 1.15–1.40)
Calcium, Ion: 1 mmol/L — ABNORMAL LOW (ref 1.15–1.40)
HCT: 23 % — ABNORMAL LOW (ref 36.0–46.0)
HCT: 21 % — ABNORMAL LOW (ref 36.0–46.0)
Hemoglobin: 7.8 g/dL — ABNORMAL LOW (ref 12.0–15.0)
Hemoglobin: 7.1 g/dL — ABNORMAL LOW (ref 12.0–15.0)
O2 Saturation: 99 %
O2 Saturation: 99 %
Patient temperature: 98.2
Potassium: 5.5 mmol/L — ABNORMAL HIGH (ref 3.5–5.1)
Potassium: 5.7 mmol/L — ABNORMAL HIGH (ref 3.5–5.1)
Sodium: 138 mmol/L (ref 135–145)
Sodium: 136 mmol/L (ref 135–145)
TCO2: 20 mmol/L — ABNORMAL LOW (ref 22–32)
TCO2: 20 mmol/L — ABNORMAL LOW (ref 22–32)
pCO2 arterial: 38.1 mmHg (ref 32.0–48.0)
pCO2 arterial: 39.7 mmHg (ref 32.0–48.0)
pH, Arterial: 7.311 — ABNORMAL LOW (ref 7.350–7.450)
pH, Arterial: 7.284 — ABNORMAL LOW (ref 7.350–7.450)
pO2, Arterial: 177 mmHg — ABNORMAL HIGH (ref 83.0–108.0)
pO2, Arterial: 167 mmHg — ABNORMAL HIGH (ref 83.0–108.0)

## 2019-09-04 LAB — PROTIME-INR
INR: 3 — ABNORMAL HIGH (ref 0.8–1.2)
Prothrombin Time: 30.2 seconds — ABNORMAL HIGH (ref 11.4–15.2)

## 2019-09-04 LAB — BRAIN NATRIURETIC PEPTIDE: B Natriuretic Peptide: 1126.7 pg/mL — ABNORMAL HIGH (ref 0.0–100.0)

## 2019-09-04 LAB — GLUCOSE, CAPILLARY
Glucose-Capillary: 74 mg/dL (ref 70–99)
Glucose-Capillary: 78 mg/dL (ref 70–99)
Glucose-Capillary: 65 mg/dL — ABNORMAL LOW (ref 70–99)
Glucose-Capillary: 99 mg/dL (ref 70–99)
Glucose-Capillary: 69 mg/dL — ABNORMAL LOW (ref 70–99)
Glucose-Capillary: 116 mg/dL — ABNORMAL HIGH (ref 70–99)
Glucose-Capillary: 122 mg/dL — ABNORMAL HIGH (ref 70–99)
Glucose-Capillary: 206 mg/dL — ABNORMAL HIGH (ref 70–99)

## 2019-09-04 LAB — CBC
HCT: 24.3 % — ABNORMAL LOW (ref 36.0–46.0)
Hemoglobin: 7.7 g/dL — ABNORMAL LOW (ref 12.0–15.0)
MCH: 33.6 pg (ref 26.0–34.0)
MCHC: 31.7 g/dL (ref 30.0–36.0)
MCV: 106.1 fL — ABNORMAL HIGH (ref 80.0–100.0)
Platelets: 141 10*3/uL — ABNORMAL LOW (ref 150–400)
RBC: 2.29 MIL/uL — ABNORMAL LOW (ref 3.87–5.11)
RDW: 12.5 % (ref 11.5–15.5)
WBC: 12.4 10*3/uL — ABNORMAL HIGH (ref 4.0–10.5)
nRBC: 0.2 % (ref 0.0–0.2)

## 2019-09-04 LAB — TRIGLYCERIDES: Triglycerides: 84 mg/dL (ref <150)

## 2019-09-04 LAB — SURGICAL PCR SCREEN
MRSA, PCR: NEGATIVE
Staphylococcus aureus: NEGATIVE

## 2019-09-04 LAB — POTASSIUM
Potassium: 6.6 mmol/L (ref 3.5–5.1)
Potassium: 5.3 mmol/L — ABNORMAL HIGH (ref 3.5–5.1)

## 2019-09-04 LAB — HEPATIC FUNCTION PANEL
ALT: 603 U/L — ABNORMAL HIGH (ref 0–44)
AST: 2108 U/L — ABNORMAL HIGH (ref 15–41)
Albumin: 2.6 g/dL — ABNORMAL LOW (ref 3.5–5.0)
Alkaline Phosphatase: 55 U/L (ref 38–126)
Bilirubin, Direct: 0.8 mg/dL — ABNORMAL HIGH (ref 0.0–0.2)
Indirect Bilirubin: 0.7 mg/dL (ref 0.3–0.9)
Total Bilirubin: 1.5 mg/dL — ABNORMAL HIGH (ref 0.3–1.2)
Total Protein: 4.3 g/dL — ABNORMAL LOW (ref 6.5–8.1)

## 2019-09-04 LAB — PHOSPHORUS
Phosphorus: 4.4 mg/dL (ref 2.5–4.6)
Phosphorus: 5.2 mg/dL — ABNORMAL HIGH (ref 2.5–4.6)

## 2019-09-04 LAB — MAGNESIUM: Magnesium: 2.9 mg/dL — ABNORMAL HIGH (ref 1.7–2.4)

## 2019-09-04 MED ORDER — SODIUM BICARBONATE 8.4 % IV SOLN
50.0000 meq | Freq: Once | INTRAVENOUS | Status: AC
  Administered 2019-09-04: 50 meq via INTRAVENOUS
  Filled 2019-09-04: qty 50

## 2019-09-04 MED ORDER — PRISMASOL BGK 0/2.5 32-2.5 MEQ/L IV SOLN
INTRAVENOUS | Status: DC
  Filled 2019-09-04 (×2): qty 5000

## 2019-09-04 MED ORDER — SODIUM CHLORIDE 0.9 % FOR CRRT: 500.0000 mL | INTRAVENOUS_CENTRAL | Status: DC | PRN

## 2019-09-04 MED ORDER — HEPARIN SODIUM (PORCINE) 1000 UNIT/ML DIALYSIS
1000.0000 [IU] | INTRAMUSCULAR | Status: DC | PRN
  Filled 2019-09-04: qty 6

## 2019-09-04 MED ORDER — PRISMASOL BGK 0/2.5 32-2.5 MEQ/L IV SOLN
INTRAVENOUS | Status: DC
  Filled 2019-09-04 (×5): qty 5000

## 2019-09-04 MED ORDER — CALCIUM GLUCONATE-NACL 1-0.675 GM/50ML-% IV SOLN
1.0000 g | Freq: Once | INTRAVENOUS | Status: AC
  Administered 2019-09-04: 1000 mg via INTRAVENOUS
  Filled 2019-09-04: qty 50

## 2019-09-04 MED ORDER — AMIODARONE HCL IN DEXTROSE 360-4.14 MG/200ML-% IV SOLN
60.0000 mg/h | INTRAVENOUS | Status: AC
  Filled 2019-09-04: qty 200

## 2019-09-04 MED ORDER — DEXTROSE 50 % IV SOLN
INTRAVENOUS | Status: AC
  Filled 2019-09-04: qty 50

## 2019-09-04 MED ORDER — AMIODARONE LOAD VIA INFUSION
150.0000 mg | Freq: Once | INTRAVENOUS | Status: AC
  Administered 2019-09-04: 150 mg via INTRAVENOUS
  Filled 2019-09-04: qty 83.34

## 2019-09-04 MED ORDER — DEXTROSE 50 % IV SOLN
12.5000 g | INTRAVENOUS | Status: AC
  Administered 2019-09-04: 12.5 g via INTRAVENOUS

## 2019-09-04 MED ORDER — AMIODARONE HCL IN DEXTROSE 360-4.14 MG/200ML-% IV SOLN
30.0000 mg/h | INTRAVENOUS | Status: DC
  Administered 2019-09-05 (×2): 30 mg/h via INTRAVENOUS
  Filled 2019-09-04 (×2): qty 200

## 2019-09-04 MED ORDER — ALBUMIN HUMAN 25 % IV SOLN
25.0000 g | Freq: Once | INTRAVENOUS | Status: AC
  Administered 2019-09-04: 25 g via INTRAVENOUS
  Filled 2019-09-04: qty 100

## 2019-09-04 MED ORDER — AMIODARONE HCL IN DEXTROSE 360-4.14 MG/200ML-% IV SOLN
INTRAVENOUS | Status: AC
  Administered 2019-09-04: 150 mg via INTRAVENOUS
  Filled 2019-09-04: qty 200

## 2019-09-04 MED ORDER — PHENYLEPHRINE HCL-NACL 10-0.9 MG/250ML-% IV SOLN
0.0000 ug/min | INTRAVENOUS | Status: DC
  Administered 2019-09-04: 20 ug/min via INTRAVENOUS
  Administered 2019-09-05: 170 ug/min via INTRAVENOUS
  Administered 2019-09-05: 150 ug/min via INTRAVENOUS
  Filled 2019-09-04 (×3): qty 250

## 2019-09-04 MED ORDER — MUPIROCIN 2 % EX OINT
1.0000 "application " | TOPICAL_OINTMENT | Freq: Two times a day (BID) | CUTANEOUS | Status: DC
  Administered 2019-09-05 (×3): 1 via NASAL
  Filled 2019-09-04: qty 22

## 2019-09-04 MED ORDER — FUROSEMIDE 10 MG/ML IJ SOLN
20.0000 mg | Freq: Once | INTRAMUSCULAR | Status: AC
  Administered 2019-09-04: 20 mg via INTRAVENOUS
  Filled 2019-09-04: qty 2

## 2019-09-04 MED ORDER — INSULIN ASPART 100 UNIT/ML IV SOLN
5.0000 [IU] | Freq: Once | INTRAVENOUS | Status: AC
  Administered 2019-09-04: 5 [IU] via INTRAVENOUS

## 2019-09-04 MED ORDER — DEXTROSE IN LACTATED RINGERS 5 % IV SOLN: INTRAVENOUS | Status: DC

## 2019-09-04 MED ORDER — DEXTROSE 50 % IV SOLN
1.0000 | Freq: Once | INTRAVENOUS | Status: AC
  Administered 2019-09-04: 50 mL via INTRAVENOUS
  Filled 2019-09-04: qty 50

## 2019-09-04 MED ORDER — PRISMASOL BGK 0/2.5 32-2.5 MEQ/L IV SOLN
INTRAVENOUS | Status: DC
  Filled 2019-09-04 (×13): qty 5000

## 2019-09-04 MED ORDER — ENOXAPARIN SODIUM 40 MG/0.4ML ~~LOC~~ SOLN: 30.0000 mg | SUBCUTANEOUS | Status: DC

## 2019-09-04 NOTE — Consult Note (Signed)
[name] Admit Date: 08/28/2019 09/04/2019 [name] Requesting Physician:  Marshell Garfinkel  Reason for Consult:  AKI, hyperkalemia, acidosis HPI:  79 year old female with history of hyperlipidemia, coronary artery disease, IBS, fibromyalgia who presented to the hospital on 5/24 with abdominal pain.  The patient was intubated so history was obtained per chart review.  The patient been having worsening abdominal pain for several days without significant relief using over-the-counter medications.  She was able to have a bowel movement using a suppository.  She had some nausea and emesis.  She denied fevers, chills, shortness of breath, chest pain, dysuria.  In the emergency department the patient was found to be hypertensive but otherwise vital signs are reassuring.  CT scan demonstrated a mid small bowel obstruction with two transition points.  An NG tube was placed and the patient was admitted for further management.  The patient failed conservative management and was taken to the OR for operative management this morning where she was found to have ischemic bowel with perforation underwent resection.  The patient was not liberated from the ventilator and was left with an abdomen open.  Patient was transferred to the ICU at that time.  The patient has been on pressors all day with decreasing urine output, rising creatinine, associated electrolyte abnormalities.  Patient is also had atrial fibrillation today with rapid rates.  Patient has no CKD at baseline and creatinine was 0.75 today at 4 AM and now is 3.16.  PMH Incudes: Past Medical History:  Diagnosis Date  . Abdominal bruit   . Abnormal weight gain   . Allergic rhinitis, cause unspecified   . CAD (coronary artery disease)    diaphragmatic wall infarction in 1999, treated with balloon angioplasty with nonobstructive disease at ast catheterization in 2007 as described above  . Colon polyp 12/20/2011   Tubular adenoma  .  Complication of anesthesia   . Depression   . Diverticular disease    acute diverticulitis 10/13/11  . Fibromyalgia   . GERD (gastroesophageal reflux disease)   . Hyperlipemia    with low HDL.  . IBS (irritable bowel syndrome)   . Migraine, unspecified, without mention of intractable migraine without mention of status migrainosus   . Tremor   . Unspecified adverse effect of unspecified drug, medicinal and biological substance   . Ventral incisional hernia 06/04/2015       Creatinine, Ser (mg/dL)  Date Value  09/04/2019 3.16 (H)  09/04/2019 2.60 (H)  09/04/2019 2.33 (H)  08/22/2019 2.14 (H)  08/14/2019 1.80 (H)  09/04/2019 1.47 (H)  08/14/2019 0.75  09/02/2019 0.54  09/01/2019 0.58  08/31/2019 0.63  ] I/Os:  ROS Balance of 12 systems is negative w/ exceptions as above  PMH  Past Medical History:  Diagnosis Date  . Abdominal bruit   . Abnormal weight gain   . Allergic rhinitis, cause unspecified   . CAD (coronary artery disease)    diaphragmatic wall infarction in 1999, treated with balloon angioplasty with nonobstructive disease at ast catheterization in 2007 as described above  . Colon polyp 12/20/2011   Tubular adenoma  . Complication of anesthesia   . Depression   . Diverticular disease    acute diverticulitis 10/13/11  . Fibromyalgia   . GERD (gastroesophageal reflux disease)   . Hyperlipemia    with low HDL.  . IBS (irritable bowel syndrome)   . Migraine, unspecified, without mention of intractable migraine without mention of status migrainosus   . Tremor   .  Unspecified adverse effect of unspecified drug, medicinal and biological substance   . Ventral incisional hernia 06/04/2015   PSH  Past Surgical History:  Procedure Laterality Date  . ABDOMINAL HYSTERECTOMY  1979   partial  . ABDOMINAL SURGERY      @ [facility]  in [place] in 2002  for ptosis of organs; mersilene mesh sling, cystocopy  by Dr  Rosana Hoes, Urologist  . BLADDER SURGERY  patient unsure of date    sling  . CATARACT EXTRACTION     left eye  . COLONOSCOPY  2011   Diverticulosis & hemorrhoids; Dr Olevia Perches  . LAPAROSCOPIC SIGMOID COLECTOMY  02/26/2012   Procedure: LAPAROSCOPIC SIGMOID COLECTOMY;  Surgeon: Adin Hector, MD;  Location: [facility];  Service: General;  Laterality: N/A;  Laparoscopic Assisted Sigmoid Colectomy  . PARTIAL COLECTOMY  02/26/2012   Procedure: PARTIAL COLECTOMY;  Surgeon: Adin Hector, MD;  Location: [facility];  Service: General;  Laterality: N/A;  Sigmoid Colectomy  . PARTIAL HYSTERECTOMY    . TONSILLECTOMY  as child  . VAGIOCOELE     RECTOCOELE   FH  Family History  Problem Relation Age of Onset  . Heart attack Father 89  . Ulcers Father   . Lung cancer Father   . Alcohol abuse Mother   . Heart attack Brother 40       1/2 BROTHER  . Bipolar disorder Sister   . Heart attack Sister 69  . Lymphoma Sister   . Stroke Sister        1/2 SISTER  . Other Brother        brain tumor  . Mental illness Son   . Colon cancer Neg Hx    SH  reports that she has never smoked. She has never used smokeless tobacco. She reports that she does not drink alcohol or use drugs. Allergies  Allergies  Allergen Reactions  . Amphetamine-Dextroamphet Er Other (See Comments)    rash  . Hydromorphone Shortness Of Breath    BREATHING AND CARDIAC ISSUES  . Metronidazole Other (See Comments)    Unknown REACTION: SORES IN MOUTH, severe diarrhea  . Penicillins     RASH  . Thorazine [Chlorpromazine] Other (See Comments)    " had a total hepatic wipeout" in 1974  . Bupivacaine Other (See Comments)    Unknown  . Fentanyl Other (See Comments)    Mental issues  . Other Other (See Comments)    perservatives- GI problems; swelling; blood infection  . Atorvastatin Other (See Comments)    Upset stomach  . Azithromycin     Unknown reaction  . Brinzolamide-Brimonidine Dermatitis and Other (See Comments)    Shortness of Breath, irritation   . Cephalexin     Unknown reaction   . Chlorpromazine Hcl     Unknown reaction  . Ciprofloxacin     REACTION: SEVERE PAIN,DEHYDRATION,DIARRHEA  . Diamox [Acetazolamide] Other (See Comments)    Causes HBP  . Doxycycline Diarrhea  . Ezetimibe     REACTION: JOINT/MUSCLE ACHES  . Gentian Violet Other (See Comments)  . Hyoscyamine Sulfate     Unknown reaction  . Lactose Intolerance (Gi) Diarrhea  . Lidocaine Swelling  . Oatmeal Other (See Comments)     Sinus Congestion   . Sulfamethoxazole     REACTION: GI upset-acid reflux  . Telithromycin     Unknown reaction  . Topiramate     Unknown reaction  . Lactose Diarrhea  . Latex Rash   Home medications  Prior to Admission medications   Medication Sig Start Date End Date Taking? Authorizing Provider  ascorbic acid (VITAMIN C) 500 MG tablet Take 500 mg by mouth daily.   Yes [provider]  aspirin EC 81 MG tablet Take 81 mg by mouth daily.  03/21/16  Yes Larey Dresser, MD  Biotin 5 MG TABS Take 5 mg by mouth daily.    Yes [provider]  Calcium Carbonate (CALTRATE 600 PO) Take 2 tablets by mouth 2 (two) times daily.    Yes [provider]  Cholecalciferol (VITAMIN D3) 50 MCG (2000 UT) capsule Take 2,000 Units by mouth daily.    Yes [provider]  cloNIDine (CATAPRES) 0.1 MG tablet TAKE 1 TABLET IN THE MORNING. Patient taking differently: Take 0.1 mg by mouth daily.  05/04/19  Yes Cottle, Billey Co., MD  Coenzyme Q10 (COQ10) 100 MG CAPS Take 1 capsule by mouth daily.    Yes [provider]  donepezil (ARICEPT) 10 MG tablet TAKE 2 TABLETS DAILY. Patient taking differently: Take 20 mg by mouth daily.  10/26/18  Yes Cottle, Billey Co., MD  esomeprazole (NEXIUM) 40 MG capsule TAKE 1 CAPSULE IN THE MORNING BEFORE BREAKFAST. Patient taking differently: Take 40 mg by mouth daily. TAKE 1 CAPSULE IN THE MORNING BEFORE BREAKFAST. 08/17/19  Yes Burns, Claudina Lick, MD  fluticasone (FLONASE) 50 MCG/ACT nasal spray Place 1 spray into both  nostrils daily.   Yes [provider]  Ginkgo Biloba 40 MG TABS Take 40 mg by mouth daily.    Yes [provider]  loratadine (CLARITIN) 10 MG tablet Take 10 mg by mouth daily.   Yes [provider]  metroNIDAZOLE (METROCREAM) 0.75 % cream Apply 1 application topically 2 (two) times daily. 07/13/19  Yes [provider]  Multiple Vitamins-Minerals (CENTRUM SILVER PO) Take 1 tablet by mouth daily.    Yes [provider]  naproxen sodium (RA NAPROXEN SODIUM) 220 MG tablet Take 220 mg by mouth 2 (two) times daily with a meal.    Yes [provider]  oxymetazoline (AFRIN) 0.05 % nasal spray Place 1 spray into both nostrils as needed for congestion.   Yes [provider]  Potassium (POTASSIMIN PO) Take 99 mg by mouth daily.    Yes [provider]  psyllium (METAMUCIL) 0.52 g capsule Take 4 capsules by mouth as needed (constipation). 4 by mouth daily prn   Yes [provider]  QUEtiapine Fumarate (SEROQUEL XR) 150 MG 24 hr tablet Take 1 tablet (150 mg total) by mouth at bedtime. 03/17/19  Yes Cottle, Billey Co., MD  rosuvastatin (CRESTOR) 20 MG tablet Take 1 tablet (20 mg total) by mouth at bedtime. Please call for OV [phone] 05/13/19  Yes Larey Dresser, MD  Simethicone (GAS-X PO) Take 1 tablet by mouth daily as needed (gerd).    Yes [provider]  Tafluprost (ZIOPTAN OP) Place 1 drop into both eyes daily.    Yes [provider]  zinc gluconate 50 MG tablet Take 50 mg by mouth daily.   Yes [provider]    Current Medications Scheduled Meds: . [COMPLETED] amiodarone  150 mg Intravenous Once  . chlorhexidine gluconate (MEDLINE KIT)  15 mL Mouth Rinse BID  . Chlorhexidine Gluconate Cloth  6 each Topical Daily  . enoxaparin (LOVENOX) injection  30 mg Subcutaneous Q24H  . fentaNYL (SUBLIMAZE) injection  25 mcg Intravenous Once  . hydrocortisone sod succinate (SOLU-CORTEF) inj  50 mg  Intravenous Q6H  . mouth rinse  15 mL Mouth Rinse 10 times per day  . mupirocin ointment  1 application Nasal BID  . pantoprazole (PROTONIX) IV  40 mg Intravenous Q24H  . sodium chloride flush  3 mL Intravenous Q12H  . Tafluprost (PF)  1 drop Both Eyes Daily   Continuous Infusions: . amiodarone     Followed by  . [START ON 10-01-2019] amiodarone    . calcium gluconate 1,000 mg (09/04/19 2120)  . dextrose 5% lactated ringers 125 mL/hr at 09/04/19 1800  . fentaNYL infusion INTRAVENOUS 100 mcg/hr (09/04/19 1800)  . lactated ringers 10 mL/hr at 09/04/19 1646  . lactated ringers Stopped (09/04/19 1054)  . meropenem (MERREM) IV Stopped (09/04/19 1447)  . norepinephrine (LEVOPHED) Adult infusion 8 mcg/min (09/04/19 1800)  . propofol (DIPRIVAN) infusion 20 mcg/kg/min (09/04/19 1800)  . vasopressin (PITRESSIN) infusion - *FOR SHOCK* 0.04 Units/min (09/04/19 2051)   PRN Meds:.acetaminophen **OR** acetaminophen, diphenhydrAMINE, fentaNYL, fentaNYL (SUBLIMAZE) injection, fluticasone, midazolam, midazolam, ondansetron (ZOFRAN) IV, phenol, prochlorperazine  CBC Recent Labs  Lab 08/08/2019 0507 08/31/19 0539 09/01/19 0453 09/02/19 0317 09/06/2019 0353 08/17/2019 1035 08/12/2019 1630 08/19/2019 1630 08/25/2019 1744 09/04/19 0415 09/04/19 1055  WBC 11.1*   < > 6.9   < > 5.5  --  5.7  --   --  12.4*  --   NEUTROABS 9.5*  --  5.1  --   --   --   --   --   --   --   --   HGB 13.4   < > 13.9   < > 11.7*   < > 9.3*   < > 7.8* 7.7* 7.8*  HCT 39.5   < > 41.6   < > 35.1*   < > 29.0*   < > 23.0* 24.3* 23.0*  MCV 99.7   < > 102.5*   < > 101.4*  --  104.3*  --   --  106.1*  --   PLT 187   < > 215   < > 206  --  151  --   --  141*  --    < > = values in this interval not displayed.   Basic Metabolic Panel Recent Labs  Lab 08/14/2019 0353 08/26/2019 1035 08/20/2019 1252 08/26/2019 1419 08/24/2019 1630 08/17/2019 1630 08/07/2019 1744 08/29/2019 1744 08/13/2019 2030 08/28/2019 2216 09/04/19 0415 09/04/19 1000  09/04/19 1055 09/04/19 1840 09/04/19 2006 09/04/19 2043  NA 136   < > 139   < > 140  --  138  --   --  135 135 138 138 137  --   --   K 3.5   < > 2.7*   < > 4.8   < > 4.6   < >  --  6.6* 5.2* 5.8* 5.5* 6.4* 6.6* 5.3*  CL 101  --  109  --  107  --   --   --   --  108 105 107  --  106  --   --   CO2 25  --  22  --  19*  --   --   --   --  16* 17* 20*  --  16*  --   --   GLUCOSE 133*  --  160*  --  105*  --   --   --   --  102* 97 83  --  127*  --   --   BUN 29*  --  34*  --  38*  --   --   --   --  41* 45* 49*  --  55*  --   --   CREATININE 0.75  --  1.47*  --  1.80*  --   --   --   --  2.14* 2.33* 2.60*  --  3.16*  --   --   CALCIUM 8.5*  --  7.0*  --  7.5*  --   --   --   --  7.5* 7.6* 7.5*  --  7.1*  --   --   PHOS  --   --   --   --   --   --   --   --  2.0*  --  4.4  --   --   --   --   --    < > = values in this interval not displayed.    Physical Exam  Blood pressure (!) 85/53, pulse 74, temperature 98.5 F (36.9 C), temperature source Oral, resp. rate (!) 35, height '5\' 1"'  (1.549 m), weight 56.4 kg, SpO2 100 %. GEN: Ill-appearing, lying in bed, ventilated ENT: no nasal discharge, mmm, ET tube in place EYES: Eyes closed, no periorbital edema CV: tachycardia, irregularly irregular rhythm PULM: ventilated, coarse bilateral breath sounds ABD: Abdomen covered, no audible bowel sounds SKIN: no rashes or jaundice EXT: no edema, warm lower extremities   Assessment 79 year old female with history of hyperlipidemia, coronary artery disease, IBS, fibromyalgia presenting with SBO status post resection for ischemic colitis complicated by shock and acute renal failure  Plan 1. Oliguric renal failure, severe: Normal baseline creatinine insignificant rising creatinine today with corresponding falling urine output and electrolyte abnormalities with acidemia and hyperkalemia.  We'll start CRRT at this time.  Given the patient's normal renal function at baseline she has potential for recovery of  her acute illness improves 1. Primary team to place dialysis catheter 2. Start CRRT with 500cc prefilter, 300cc post filter, 1.5 L dialysis fluid 3. All 2K bath 4. Continue aggressive hemodynamic support per primary team 5. Maintain even to positive 2. Acidemia: pH 7.28.  Mixed metabolic acidosis with inadequate respiratory compensation.  Should improve with CRRT.  Ventilation per primary team 3. Hyperkalemia: Likely related to renal failure and high cellular turnover.  CRRT as above.  Continue to monitor every 6 hours 4. Shock: Likely septic/vasogenic related to ischemic gut.  On antibiotics.  Primary team supporting blood pressure with pressors and hydrocortisone. 5. Atrial fibrillation with RVR: Primary team managing with IV amiodarone. 6. Small bowel obstruction with ischemia and perforation: Status post ex lap with resection.  Abdomen open.  Surgery following and managing 7. Hypoxic respiratory failure: Postop status.  Primary team managing  Plans communicated to the primary team   [name]  [phone] pgr 09/04/2019, 9:35 PM

## 2019-09-04 NOTE — Progress Notes (Signed)
NAME:  [name], MRN:  [id number], DOB:  [birth year], LOS: 5 ADMISSION DATE:  08/09/2019, CONSULTATION DATE:  08/17/2019 REFERRING MD: Jonny Ruiz MD, CHIEF COMPLAINT: Small bowel obstruction s/p ex lap, vent management  Brief History   79 year old with nonobstructive CAD, HLD, IBS, depression admitted with small bowel obstruction.  Failed conservative management and taken to exploratory laparotomy today with findings of ischemic bowel with perforation underwent resection Left on the ventilator with open abdomen and transferred to ICU.  PCCM consulted for vent management  Past Medical History    has a past medical history of Abdominal bruit, Abnormal weight gain, Allergic rhinitis, cause unspecified, CAD (coronary artery disease), Colon polyp (0000000), Complication of anesthesia, Depression, Diverticular disease, Fibromyalgia, GERD (gastroesophageal reflux disease), Hyperlipemia, IBS (irritable bowel syndrome), Migraine, unspecified, without mention of intractable migraine without mention of status migrainosus, Tremor, Unspecified adverse effect of unspecified drug, medicinal and biological substance, and Ventral incisional hernia (06/04/2015).  Significant Hospital Events   5/24- Admit 5/28- Ex lap, bowel resection. To ICU on vent, pressors, open abdomen  Consults:  Surgery  Procedures:  OETT 5/28 >>  Rt IJ CVL 5/28 >> Lt radial a line 5/28 >>  Significant Diagnostic Tests:  CT abdomen pelvis 09/02/2019-mid small bowel obstruction with 2 transition zones.  Visualized lung bases clear. I have reviewed the images personally  Echocardiogram 09/02/2019-LVEF 0000000, grade 1 diastolic dysfunction, mildly elevated PA systolic pressure.  Micro Data:  Blood culture 5/28  Antimicrobials:  Cefazolin 5/28 surgical prophylaxis Meropenem 5/28 >>  Interim history/subjective:   Remains on norepinephrine. Vaso off.  Deeply sedated with open abdomen  Objective   Blood pressure (!) 78/50, pulse  98, temperature 98.2 F (36.8 C), temperature source Axillary, resp. rate (!) 35, height 5\' 1"  (1.549 m), weight 56.4 kg, SpO2 100 %. CVP:  [14 mmHg] 14 mmHg  Vent Mode: PRVC FiO2 (%):  [40 %-100 %] 40 % Set Rate:  [5 bmp-35 bmp] 35 bmp Vt Set:  [280 mL-330 mL] 280 mL PEEP:  [5 cmH20] 5 cmH20 Plateau Pressure:  [12 cmH20-16 cmH20] 16 cmH20   Intake/Output Summary (Last 24 hours) at 09/04/2019 0853 Last data filed at 09/04/2019 0800 Gross per 24 hour  Intake 7320.2 ml  Output 2075 ml  Net 5245.2 ml   Filed Weights   08/16/2019 2133 08/31/19 2114  Weight: 58.4 kg 56.4 kg    Examination: Gen:      No acute distress HEENT:  EOMI, sclera anicteric, ETT Neck:     No masses; no thyromegaly Lungs:    Clear to auscultation bilaterally; normal respiratory effort CV:         Regular rate and rhythm; no murmurs Abd:      + bowel sounds; soft, non-tender; no palpable masses, no distension Ext:    No edema; adequate peripheral perfusion Skin:      Warm and dry; no rash Neuro: Sedated  Lab significant for potassium 5.2, BUN/creatinine 45/2.23 WBC 12.4, hemoglobin 7.7 No new imaging  Resolved Hospital Problem list     Assessment & Plan:  Acute small bowel obstruction with ischemia, perforation S/p ex lap and resection Remains on the ventilator with open abdomen Continue meropenem Follow cultures Return to the OR per surgery  Acute hypoxic respiratory failure secondary to bowel ischemia, postop status Continue vent support Low tidal volume ventilation 6cc/kg. At risk for ARDS Keep well sedated No plans on weaning until abdomen is closed. Repeat ABG  Sepsis, present on admission.  Unknown  organism Septic shock Wean pressors IV fluid hydration  Hypertension Holding blood pressure medication  Hypoglycemia > improved Monitor  Best practice:  Diet: NPO Pain/Anxiety/Delirium protocol (if indicated): Fentanyl, propofol, versed PRN, RASS goal - 4 VAP protocol (if indicated):  Ordered DVT prophylaxis: Lovenox GI prophylaxis: PPI Glucose control: Monitor Mobility: Bed Code Status: Full Family Communication: Son and daughter updated Disposition: ICU  Critical care time:    The patient is critically ill with multiple organ system failure and requires high complexity decision making for assessment and support, frequent evaluation and titration of therapies, advanced monitoring, review of radiographic studies and interpretation of complex data.   Critical Care Time devoted to patient care services, exclusive of separately billable procedures, described in this note is 45 minutes.   Marshell Garfinkel MD [facility] Please see [email or web address] for pager details.  09/04/2019, 8:54 AM

## 2019-09-04 NOTE — Anesthesia Preprocedure Evaluation (Deleted)
Anesthesia Evaluation    Reviewed: Allergy & Precautions, Patient's Chart, lab work & pertinent test results  History of Anesthesia Complications Negative for: history of anesthetic complications  Airway        Dental   Pulmonary   Intubated           Cardiovascular hypertension, Pt. on medications + CAD and + Past MI     '21 TTE - EF 55 to 60%. Grade I diastolic dysfunction (impaired relaxation). There is mildly elevated pulmonary artery systolic pressure. Trivial MR. Mild aortic valve sclerosis is present, with no evidence of aortic valve stenosis.     Neuro/Psych  Headaches, PSYCHIATRIC DISORDERS Anxiety Depression  Neuromuscular disease    GI/Hepatic GERD  Medicated, Elevated LFTs   Ischemic bowel IBS    Endo/Other   Hyperkalemia Hypocalcemia  Renal/GU Renal InsufficiencyRenal disease     Musculoskeletal  (+) Fibromyalgia -  Abdominal   Peds  Hematology  (+) anemia ,  Thrombocytopenia    Anesthesia Other Findings Covid neg 5/24  Reproductive/Obstetrics                            Anesthesia Physical  Anesthesia Plan  ASA: V  Anesthesia Plan: General   Post-op Pain Management:    Induction: Intravenous  PONV Risk Score and Plan: 4 or greater and Treatment may vary due to age or medical condition  Airway Management Planned: Oral ETT  Additional Equipment: Arterial line and CVP  Intra-op Plan:   Post-operative Plan: Post-operative intubation/ventilation  Informed Consent:   Plan Discussed with: CRNA and Anesthesiologist  Anesthesia Plan Comments:        Anesthesia Quick Evaluation

## 2019-09-04 NOTE — Progress Notes (Signed)
CRITICAL VALUE ALERT  Critical Value:  K 6.6  Date & Time Notied:  09/04/19 @ 2048  Provider Notified: Dr Genevive Bi  Orders Received/Actions taken: MD aware

## 2019-09-04 NOTE — Progress Notes (Addendum)
PCCM Interval progress note:   Paged to see patient with Dr. Julaine Hua for progressive renal failure with hyperkalemia and minimal UOP of 125cc all day along with new onset Afib with RVR, HR 120-140's.       Basic metabolic panel [id number] (Abnormal)   Collected: 09/04/19 1840   Updated: 09/04/19 1917   Specimen Type: Blood    Sodium 137 mmol/L   Potassium 6.4High Panic   mmol/L   Chloride 106 mmol/L   CO2 16Low  mmol/L   Glucose, Bld 127High  mg/dL   BUN 55High  mg/dL   Creatinine, Ser 3.16High  mg/dL   Calcium 7.1Low  mg/dL   GFR calc non Af Amer 13Low  mL/min   GFR calc Af Amer 16Low  mL/min   Anion gap 15   Pt was given 1 amp bicarb and calcium and started on amiodarone infusion. Her hypotension also worsened and neosynephrine added.  I spoke with patient's daughter Marita [place] and updated her on patient's condition and obtained consent for HD catheter.  Nephrology was consulted and evaluated pt; will start CRRT tonight.      CRITICAL CARE Performed by: Otilio Carpen Samar Dass   Total critical care time: 40 minutes  Critical care time was exclusive of separately billable procedures and treating other patients.  Critical care was necessary to treat or prevent imminent or life-threatening deterioration.  Critical care was time spent personally by me on the following activities: development of treatment plan with patient and/or surrogate as well as nursing, discussions with consultants, evaluation of patient's response to treatment, examination of patient, obtaining history from patient or surrogate, ordering and performing treatments and interventions, ordering and review of laboratory studies, ordering and review of radiographic studies, pulse oximetry and re-evaluation of patient's condition.    Otilio Carpen Gisel Vipond, PA-C

## 2019-09-04 NOTE — Progress Notes (Addendum)
eLink Physician-Brief Progress Note Patient Name: [name] DOB: 06/28/1940 MRN: [id number]   Date of Service  09/04/2019  HPI/Events of Note  Notified of afib RVR as norepinephrine being increased. Vasopressin about to be resumed with aim of coming down on norepinephrine. Dextrose/insulin/bicarb already given. K confirmed at 6.6.  eICU Interventions  Ordered calcium gluconate Bedside CCM team informed of current status and will go assess for possible renal replacement tonight     Intervention Category Major Interventions: Electrolyte abnormality - evaluation and management;Hypotension - evaluation and management;Arrhythmia - evaluation and management  Judd Lien 09/04/2019, 8:51 PM

## 2019-09-04 NOTE — Progress Notes (Signed)
CRITICAL VALUE ALERT  Critical Value:  K 6.4  Date & Time Notied:  09/04/2019 @ 1920  Provider Notified: Warren Samah MD  Orders Received/Actions taken: bedside RN made aware; awaiting orders.

## 2019-09-04 NOTE — Progress Notes (Signed)
eLink Physician-Brief Progress Note Patient Name: [name] DOB: October 30, 1940 MRN: [id number]   Date of Service  09/04/2019  HPI/Events of Note  Notified of K 6.4, On review noted to be slightly hemolyzed. Creatinine now 3.16. Oligoanuric since this morning.  eICU Interventions  Will give D50/insulin and bicarb, redraw prior to shift to confirm but likely elevated as it was 5.5 this morning     Intervention Category Major Interventions: Acute renal failure - evaluation and management;Electrolyte abnormality - evaluation and management  Judd Lien 09/04/2019, 7:46 PM

## 2019-09-04 NOTE — Progress Notes (Signed)
1 Day Post-Op   Subjective/Chief Complaint: Patient remains intubated, sedated on Propofol/ Fentanyl Weaned off Vasopressin, still on some Levophed Moderate UOP Afebrile VAC with good seal - 650 cc ascites output   Objective: Vital signs in last 24 hours: Temp:  [97.2 F (36.2 C)-98.2 F (36.8 C)] 97.3 F (36.3 C) (05/29 0412) Pulse Rate:  [58-122] 91 (05/29 0545) Resp:  [19-36] 35 (05/29 0545) BP: (95-147)/(47-86) 117/67 (05/29 0500) SpO2:  [93 %-100 %] 100 % (05/29 0545) Arterial Line BP: (93-162)/(48-72) 114/51 (05/29 0545) FiO2 (%):  [40 %-100 %] 40 % (05/29 0401) Last BM Date: 08/29/19  Intake/Output from previous day: 05/28 0701 - 05/29 0700 In: 6740.6 [I.V.:4562.7; IV Piggyback:2177.8] Out: 2075 [Urine:625; Emesis/NG output:700; Drains:650; Blood:100] Intake/Output this shift: No intake/output data recorded.  PE: Intubated, sedated Abd - minimal distention; hypoactive bowel sounds; NG functioning VAC with good seal  Lab Results:  Recent Labs    08/27/2019 1630 08/12/2019 1630 08/16/2019 1744 09/04/19 0415  WBC 5.7  --   --  12.4*  HGB 9.3*   < > 7.8* 7.7*  HCT 29.0*   < > 23.0* 24.3*  PLT 151  --   --  141*   < > = values in this interval not displayed.   BMET Recent Labs    09/04/2019 2216 09/04/19 0415  NA 135 135  K 6.6* 5.2*  CL 108 105  CO2 16* 17*  GLUCOSE 102* 97  BUN 41* 45*  CREATININE 2.14* 2.33*  CALCIUM 7.5* 7.6*   PT/INR No results for input(s): LABPROT, INR in the last 72 hours. ABG Recent Labs    08/27/2019 1419 08/15/2019 1744  PHART 7.310* 7.373  HCO3 25.8 21.5    Studies/Results: DG CHEST PORT 1 VIEW  Result Date: 08/27/2019 CLINICAL DATA:  Intubation. EXAM: PORTABLE CHEST 1 VIEW COMPARISON:  Chest x-ray 09/04/2019. FINDINGS: Endotracheal tube tip noted 3 cm above the carina. Right IJ line noted with tip at cavoatrial junction. NG tube noted with tip below left hemidiaphragm. Side hole is at the gastroesophageal junction.  Advancement of the NG tube approximately 10 cm should be considered. Heart size normal. Lungs are clear. No pleural effusion or pneumothorax. IMPRESSION: 1. NG tube noted with side hole at the gastroesophageal junction, advancement of approximately 10 cm. 2.  Endotracheal tube and right IJ line in good anatomic position. 3.  No acute cardiopulmonary disease identified. Electronically Signed   By: Marcello Moores  Register   On: 09/02/2019 13:49   DG Abd Portable 1V  Result Date: 08/29/2019 CLINICAL DATA:  NG tube placement EXAM: PORTABLE ABDOMEN - 1 VIEW COMPARISON:  09/02/2019 FINDINGS: Esophageal tube tip overlies the proximal stomach, side-port in the region of distal esophagus. Partially visualized dilated loops of small bowel suspicious for obstruction. IMPRESSION: Esophageal tube side-port in the region of distal esophagus, consider further advancement for more optimal positioning. Electronically Signed   By: Donavan Foil M.D.   On: 08/14/2019 16:31   DG Abd Portable 1V  Result Date: 09/02/2019 CLINICAL DATA:  Small-bowel obstruction EXAM: PORTABLE ABDOMEN - 1 VIEW COMPARISON:  Portable exam 0813 hours compared to 09/01/2019 FINDINGS: Persistent gaseous distension of small bowel loops. Small amount of gas and stool in RIGHT colon. Findings remain consistent with small bowel obstruction. No definite bowel wall thickening. Tip of nasogastric tube projects over stomach. Degenerative disc and facet disease changes at lower lumbar spine with osseous demineralization. IMPRESSION: Persistent small bowel obstruction. Electronically Signed   By: Crist Infante.D.  On: 09/02/2019 08:50   ECHOCARDIOGRAM COMPLETE  Result Date: 09/02/2019    ECHOCARDIOGRAM REPORT   Patient Name:   [name] Date of Exam: 09/02/2019 Medical Rec #:  [id number]    Height:       61.0 in Accession #:    [id number]   Weight:       124.3 lb Date of Birth:  31-Oct-1940    BSA:          1.543 m Patient Age:    79 years     BP:           164/66  mmHg Patient Gender: F            HR:           72 bpm. Exam Location:  Inpatient Procedure: 2D Echo, Cardiac Doppler and Color Doppler STAT ECHO Indications:    Abnormal ECG 794.31 / R94.31  History:        Patient has prior history of Echocardiogram examinations, most                 recent 05/20/2013. Cardiomegaly, CAD, Signs/Symptoms:Shortness of                 Breath; Risk Factors:Hypertension and Non-Smoker. PI. GERD.  Sonographer:    Vickie Epley RDCS Referring Phys: [place]  1. Left ventricular ejection fraction, by estimation, is 55 to 60%. The left ventricle has normal function. The left ventricle has no regional wall motion abnormalities. Left ventricular diastolic parameters are consistent with Grade I diastolic dysfunction (impaired relaxation).  2. Right ventricular systolic function is normal. The right ventricular size is normal. There is mildly elevated pulmonary artery systolic pressure.  3. The mitral valve is grossly normal. Trivial mitral valve regurgitation.  4. The aortic valve is tricuspid. Aortic valve regurgitation is not visualized. Mild aortic valve sclerosis is present, with no evidence of aortic valve stenosis.  5. The inferior vena cava is normal in size with greater than 50% respiratory variability, suggesting right atrial pressure of 3 mmHg. FINDINGS  Left Ventricle: Left ventricular ejection fraction, by estimation, is 55 to 60%. The left ventricle has normal function. The left ventricle has no regional wall motion abnormalities. The left ventricular internal cavity size was normal in size. There is  no left ventricular hypertrophy. Left ventricular diastolic parameters are consistent with Grade I diastolic dysfunction (impaired relaxation). Indeterminate filling pressures. Right Ventricle: The right ventricular size is normal. No increase in right ventricular wall thickness. Right ventricular systolic function is normal. There is mildly elevated pulmonary  artery systolic pressure. The tricuspid regurgitant velocity is 2.88  m/s, and with an assumed right atrial pressure of 3 mmHg, the estimated right ventricular systolic pressure is 0000000 mmHg. Left Atrium: Left atrial size was normal in size. Right Atrium: Right atrial size was normal in size. Pericardium: There is no evidence of pericardial effusion. Mitral Valve: The mitral valve is grossly normal. Trivial mitral valve regurgitation. Tricuspid Valve: The tricuspid valve is grossly normal. Tricuspid valve regurgitation is trivial. Aortic Valve: The aortic valve is tricuspid. Aortic valve regurgitation is not visualized. Mild aortic valve sclerosis is present, with no evidence of aortic valve stenosis. Pulmonic Valve: The pulmonic valve was normal in structure. Pulmonic valve regurgitation is not visualized. Aorta: The aortic root and ascending aorta are structurally normal, with no evidence of dilitation. Venous: The inferior vena cava is normal in size with greater than 50% respiratory variability, suggesting  right atrial pressure of 3 mmHg. IAS/Shunts: No atrial level shunt detected by color flow Doppler.  LEFT VENTRICLE PLAX 2D LVIDd:         4.58 cm     Diastology LVIDs:         3.28 cm     LV e' lateral:   8.16 cm/s LV PW:         0.92 cm     LV E/e' lateral: 7.4 LV IVS:        0.92 cm     LV e' medial:    5.55 cm/s LVOT diam:     1.70 cm     LV E/e' medial:  10.8 LV SV:         52 LV SV Index:   33 LVOT Area:     2.27 cm  LV Volumes (MOD) LV vol d, MOD A2C: 66.4 ml LV vol d, MOD A4C: 75.9 ml LV vol s, MOD A2C: 30.0 ml LV vol s, MOD A4C: 37.1 ml LV SV MOD A2C:     36.4 ml LV SV MOD A4C:     75.9 ml LV SV MOD BP:      38.6 ml RIGHT VENTRICLE RV S prime:     26.10 cm/s TAPSE (M-mode): 2.5 cm LEFT ATRIUM             Index       RIGHT ATRIUM          Index LA diam:        4.80 cm 3.11 cm/m  RA Area:     7.66 cm LA Vol (A2C):   34.5 ml 22.35 ml/m RA Volume:   13.80 ml 8.94 ml/m LA Vol (A4C):   23.4 ml 15.16  ml/m LA Biplane Vol: 29.7 ml 19.24 ml/m  AORTIC VALVE LVOT Vmax:   109.00 cm/s LVOT Vmean:  71.100 cm/s LVOT VTI:    0.227 m  AORTA Ao Root diam: 3.40 cm MITRAL VALVE                TRICUSPID VALVE MV Area (PHT): 3.12 cm     TR Peak grad:   33.2 mmHg MV Decel Time: 243 msec     TR Vmax:        288.00 cm/s MV E velocity: 60.20 cm/s MV A velocity: 110.00 cm/s  SHUNTS MV E/A ratio:  0.55         Systemic VTI:  0.23 m                             Systemic Diam: 1.70 cm Lyman Bishop MD Electronically signed by Lyman Bishop MD Signature Date/Time: 09/02/2019/10:30:16 AM    Final     Anti-infectives: Anti-infectives (From admission, onward)   Start     Dose/Rate Route Frequency Ordered Stop   09/04/19 0230  meropenem (MERREM) 1 g in sodium chloride 0.9 % 100 mL IVPB     1 g 200 mL/hr over 30 Minutes Intravenous Every 12 hours 08/16/2019 2147     08/29/2019 1400  meropenem (MERREM) 2 g in sodium chloride 0.9 % 100 mL IVPB  Status:  Discontinued     2 g 200 mL/hr over 30 Minutes Intravenous Every 12 hours 08/13/2019 1220 08/13/2019 2147   08/09/2019 0940  ceFAZolin (ANCEF) 2-4 GM/100ML-% IVPB    Note to Pharmacy: Grace Blight   : cabinet override      09/01/2019 0940  08/30/2019 1100   08/09/2019 0600  ceFAZolin (ANCEF) IVPB 2g/100 mL premix     2 g 200 mL/hr over 30 Minutes Intravenous On call to O.R. 09/02/19 1512 08/29/2019 1032      Assessment/Plan: HTN HLD CAD, MI 1995 GERD Hypertensive urgency - per primary  SBO, s/p exploratory laparotomy with resection of 40 cm of frankly necrtotic terminal ileum and part of right colon - Dr. 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

## 2019-09-04 NOTE — Progress Notes (Signed)
CRITICAL VALUE ALERT  Critical Value: K+  Date & Time Notied: 09/02/2019  Provider Notified: Yes  Orders Received/Actions taken: The sample was thought to be hemolyzed per lab. A repeat sample was drawn and sent down to confirm results. Waiting for results.

## 2019-09-04 NOTE — Procedures (Signed)
Hemodialysis Catheter Insertion Procedure Note [name] [id number] 1940-10-12  Procedure: Insertion of Hemodialysis Catheter Indications: Dialysis Access   Procedure Details Consent: Risks of procedure as well as the alternatives and risks of each were explained to the (patient/caregiver).  Consent for procedure obtained. Time Out: Verified patient identification, verified procedure, site/side was marked, verified correct patient position, special equipment/implants available, medications/allergies/relevent history reviewed, required imaging and test results available.  Performed  Maximum sterile technique was used including antiseptics, cap, gloves, gown, hand hygiene, mask and sheet. Skin prep: Chlorhexidine; local anesthetic administered Triple lumen hemodialysis catheter was inserted into left internal jugular vein using the Seldinger technique with US guidance.          Evaluation Blood flow good Complications: No apparent complications Patient did tolerate procedure well. Chest X-ray ordered to verify placement.  CXR: pending.   [name] Elyas Villamor 09/04/2019

## 2019-09-05 ENCOUNTER — Inpatient Hospital Stay (HOSPITAL_COMMUNITY): Payer: Medicare Other

## 2019-09-05 LAB — POCT I-STAT 7, (LYTES, BLD GAS, ICA,H+H)
Acid-base deficit: 10 mmol/L — ABNORMAL HIGH (ref 0.0–2.0)
Acid-base deficit: 8 mmol/L — ABNORMAL HIGH (ref 0.0–2.0)
Bicarbonate: 17.1 mmol/L — ABNORMAL LOW (ref 20.0–28.0)
Bicarbonate: 17.4 mmol/L — ABNORMAL LOW (ref 20.0–28.0)
Calcium, Ion: 0.99 mmol/L — ABNORMAL LOW (ref 1.15–1.40)
Calcium, Ion: 0.91 mmol/L — ABNORMAL LOW (ref 1.15–1.40)
HCT: 21 % — ABNORMAL LOW (ref 36.0–46.0)
HCT: 20 % — ABNORMAL LOW (ref 36.0–46.0)
Hemoglobin: 7.1 g/dL — ABNORMAL LOW (ref 12.0–15.0)
Hemoglobin: 6.8 g/dL — CL (ref 12.0–15.0)
O2 Saturation: 100 %
O2 Saturation: 100 %
Patient temperature: 97.6
Patient temperature: 32.7
Potassium: 4.9 mmol/L (ref 3.5–5.1)
Potassium: 4.3 mmol/L (ref 3.5–5.1)
Sodium: 139 mmol/L (ref 135–145)
Sodium: 139 mmol/L (ref 135–145)
TCO2: 18 mmol/L — ABNORMAL LOW (ref 22–32)
TCO2: 18 mmol/L — ABNORMAL LOW (ref 22–32)
pCO2 arterial: 39.9 mmHg (ref 32.0–48.0)
pCO2 arterial: 26.6 mmHg — ABNORMAL LOW (ref 32.0–48.0)
pH, Arterial: 7.238 — ABNORMAL LOW (ref 7.350–7.450)
pH, Arterial: 7.405 (ref 7.350–7.450)
pO2, Arterial: 204 mmHg — ABNORMAL HIGH (ref 83.0–108.0)
pO2, Arterial: 161 mmHg — ABNORMAL HIGH (ref 83.0–108.0)

## 2019-09-05 LAB — CBC WITH DIFFERENTIAL/PLATELET
Abs Immature Granulocytes: 7.21 10*3/uL — ABNORMAL HIGH (ref 0.00–0.07)
Basophils Absolute: 0 10*3/uL (ref 0.0–0.1)
Basophils Relative: 0 %
Eosinophils Absolute: 0 10*3/uL (ref 0.0–0.5)
Eosinophils Relative: 0 %
HCT: 24.8 % — ABNORMAL LOW (ref 36.0–46.0)
Hemoglobin: 7.6 g/dL — ABNORMAL LOW (ref 12.0–15.0)
Immature Granulocytes: 44 %
Lymphocytes Relative: 6 %
Lymphs Abs: 0.9 10*3/uL (ref 0.7–4.0)
MCH: 33.9 pg (ref 26.0–34.0)
MCHC: 30.6 g/dL (ref 30.0–36.0)
MCV: 110.7 fL — ABNORMAL HIGH (ref 80.0–100.0)
Monocytes Absolute: 1.2 10*3/uL — ABNORMAL HIGH (ref 0.1–1.0)
Monocytes Relative: 7 %
Neutro Abs: 7 10*3/uL (ref 1.7–7.7)
Neutrophils Relative %: 43 %
Platelets: 118 10*3/uL — ABNORMAL LOW (ref 150–400)
RBC: 2.24 MIL/uL — ABNORMAL LOW (ref 3.87–5.11)
RDW: 12.7 % (ref 11.5–15.5)
WBC: 16.3 10*3/uL — ABNORMAL HIGH (ref 4.0–10.5)
nRBC: 2 % — ABNORMAL HIGH (ref 0.0–0.2)

## 2019-09-05 LAB — GLUCOSE, CAPILLARY
Glucose-Capillary: 222 mg/dL — ABNORMAL HIGH (ref 70–99)
Glucose-Capillary: 159 mg/dL — ABNORMAL HIGH (ref 70–99)
Glucose-Capillary: 152 mg/dL — ABNORMAL HIGH (ref 70–99)
Glucose-Capillary: 114 mg/dL — ABNORMAL HIGH (ref 70–99)
Glucose-Capillary: 49 mg/dL — ABNORMAL LOW (ref 70–99)
Glucose-Capillary: 213 mg/dL — ABNORMAL HIGH (ref 70–99)
Glucose-Capillary: 132 mg/dL — ABNORMAL HIGH (ref 70–99)
Glucose-Capillary: 43 mg/dL — CL (ref 70–99)
Glucose-Capillary: 87 mg/dL (ref 70–99)
Glucose-Capillary: 70 mg/dL (ref 70–99)

## 2019-09-05 LAB — RENAL FUNCTION PANEL
Albumin: 1.7 g/dL — ABNORMAL LOW (ref 3.5–5.0)
Albumin: 1.3 g/dL — ABNORMAL LOW (ref 3.5–5.0)
Anion gap: 15 (ref 5–15)
Anion gap: 23 — ABNORMAL HIGH (ref 5–15)
BUN: 49 mg/dL — ABNORMAL HIGH (ref 8–23)
BUN: 15 mg/dL (ref 8–23)
CO2: 15 mmol/L — ABNORMAL LOW (ref 22–32)
CO2: 13 mmol/L — ABNORMAL LOW (ref 22–32)
Calcium: 5.8 mg/dL — CL (ref 8.9–10.3)
Calcium: 6.7 mg/dL — ABNORMAL LOW (ref 8.9–10.3)
Chloride: 109 mmol/L (ref 98–111)
Chloride: 101 mmol/L (ref 98–111)
Creatinine, Ser: 2.91 mg/dL — ABNORMAL HIGH (ref 0.44–1.00)
Creatinine, Ser: 1.3 mg/dL — ABNORMAL HIGH (ref 0.44–1.00)
GFR calc Af Amer: 17 mL/min — ABNORMAL LOW (ref >60)
GFR calc Af Amer: 46 mL/min — ABNORMAL LOW (ref >60)
GFR calc non Af Amer: 15 mL/min — ABNORMAL LOW (ref >60)
GFR calc non Af Amer: 39 mL/min — ABNORMAL LOW (ref >60)
Glucose, Bld: 191 mg/dL — ABNORMAL HIGH (ref 70–99)
Glucose, Bld: 93 mg/dL (ref 70–99)
Phosphorus: 5.6 mg/dL — ABNORMAL HIGH (ref 2.5–4.6)
Phosphorus: 6.4 mg/dL — ABNORMAL HIGH (ref 2.5–4.6)
Potassium: 5.4 mmol/L — ABNORMAL HIGH (ref 3.5–5.1)
Potassium: 6.1 mmol/L — ABNORMAL HIGH (ref 3.5–5.1)
Sodium: 139 mmol/L (ref 135–145)
Sodium: 137 mmol/L (ref 135–145)

## 2019-09-05 LAB — POCT I-STAT EG7
Acid-base deficit: 7 mmol/L — ABNORMAL HIGH (ref 0.0–2.0)
Bicarbonate: 19 mmol/L — ABNORMAL LOW (ref 20.0–28.0)
Calcium, Ion: 0.92 mmol/L — ABNORMAL LOW (ref 1.15–1.40)
HCT: 26 % — ABNORMAL LOW (ref 36.0–46.0)
Hemoglobin: 8.8 g/dL — ABNORMAL LOW (ref 12.0–15.0)
O2 Saturation: 58 %
Patient temperature: 32.7
Potassium: 4.5 mmol/L (ref 3.5–5.1)
Sodium: 140 mmol/L (ref 135–145)
TCO2: 20 mmol/L — ABNORMAL LOW (ref 22–32)
pCO2, Ven: 34.6 mmHg — ABNORMAL LOW (ref 44.0–60.0)
pH, Ven: 7.325 (ref 7.250–7.430)
pO2, Ven: 25 mmHg — CL (ref 32.0–45.0)

## 2019-09-05 LAB — CBC
HCT: 24.1 % — ABNORMAL LOW (ref 36.0–46.0)
HCT: 19.6 % — ABNORMAL LOW (ref 36.0–46.0)
Hemoglobin: 7.4 g/dL — ABNORMAL LOW (ref 12.0–15.0)
Hemoglobin: 5.9 g/dL — CL (ref 12.0–15.0)
MCH: 33.8 pg (ref 26.0–34.0)
MCH: 34.1 pg — ABNORMAL HIGH (ref 26.0–34.0)
MCHC: 30.7 g/dL (ref 30.0–36.0)
MCHC: 30.1 g/dL (ref 30.0–36.0)
MCV: 110 fL — ABNORMAL HIGH (ref 80.0–100.0)
MCV: 113.3 fL — ABNORMAL HIGH (ref 80.0–100.0)
Platelets: 138 10*3/uL — ABNORMAL LOW (ref 150–400)
Platelets: 85 10*3/uL — ABNORMAL LOW (ref 150–400)
RBC: 2.19 MIL/uL — ABNORMAL LOW (ref 3.87–5.11)
RBC: 1.73 MIL/uL — ABNORMAL LOW (ref 3.87–5.11)
RDW: 12.6 % (ref 11.5–15.5)
RDW: 12.9 % (ref 11.5–15.5)
WBC: 16.5 10*3/uL — ABNORMAL HIGH (ref 4.0–10.5)
WBC: 16.5 10*3/uL — ABNORMAL HIGH (ref 4.0–10.5)
nRBC: 1.8 % — ABNORMAL HIGH (ref 0.0–0.2)
nRBC: 3.5 % — ABNORMAL HIGH (ref 0.0–0.2)

## 2019-09-05 LAB — COMPREHENSIVE METABOLIC PANEL
ALT: 637 U/L — ABNORMAL HIGH (ref 0–44)
AST: 2401 U/L — ABNORMAL HIGH (ref 15–41)
Albumin: 1.8 g/dL — ABNORMAL LOW (ref 3.5–5.0)
Alkaline Phosphatase: 132 U/L — ABNORMAL HIGH (ref 38–126)
Anion gap: 12 (ref 5–15)
BUN: 37 mg/dL — ABNORMAL HIGH (ref 8–23)
CO2: 17 mmol/L — ABNORMAL LOW (ref 22–32)
Calcium: 6.6 mg/dL — ABNORMAL LOW (ref 8.9–10.3)
Chloride: 109 mmol/L (ref 98–111)
Creatinine, Ser: 2.41 mg/dL — ABNORMAL HIGH (ref 0.44–1.00)
GFR calc Af Amer: 22 mL/min — ABNORMAL LOW (ref >60)
GFR calc non Af Amer: 19 mL/min — ABNORMAL LOW (ref >60)
Glucose, Bld: 173 mg/dL — ABNORMAL HIGH (ref 70–99)
Potassium: 5 mmol/L (ref 3.5–5.1)
Sodium: 138 mmol/L (ref 135–145)
Total Bilirubin: 1.4 mg/dL — ABNORMAL HIGH (ref 0.3–1.2)
Total Protein: 3.6 g/dL — ABNORMAL LOW (ref 6.5–8.1)

## 2019-09-05 LAB — MAGNESIUM: Magnesium: 2.5 mg/dL — ABNORMAL HIGH (ref 1.7–2.4)

## 2019-09-05 LAB — POTASSIUM
Potassium: 5.3 mmol/L — ABNORMAL HIGH (ref 3.5–5.1)
Potassium: 4.5 mmol/L (ref 3.5–5.1)
Potassium: 5.3 mmol/L — ABNORMAL HIGH (ref 3.5–5.1)

## 2019-09-05 LAB — PREPARE RBC (CROSSMATCH)

## 2019-09-05 LAB — TRIGLYCERIDES: Triglycerides: 104 mg/dL (ref <150)

## 2019-09-05 LAB — LACTIC ACID, PLASMA
Lactic Acid, Venous: 7.2 mmol/L (ref 0.5–1.9)
Lactic Acid, Venous: 9 mmol/L (ref 0.5–1.9)

## 2019-09-05 MED ORDER — VANCOMYCIN VARIABLE DOSE PER UNSTABLE RENAL FUNCTION (PHARMACIST DOSING): Status: DC

## 2019-09-05 MED ORDER — DEXTROSE IN LACTATED RINGERS 5 % IV SOLN: INTRAVENOUS | Status: DC

## 2019-09-05 MED ORDER — SODIUM CHLORIDE 0.9 % IV BOLUS
500.0000 mL | Freq: Once | INTRAVENOUS | Status: AC
  Administered 2019-09-05: 500 mL via INTRAVENOUS

## 2019-09-05 MED ORDER — LACTATED RINGERS IV BOLUS
1000.0000 mL | Freq: Once | INTRAVENOUS | Status: AC
  Administered 2019-09-05: 1000 mL via INTRAVENOUS

## 2019-09-05 MED ORDER — SODIUM BICARBONATE 8.4 % IV SOLN
50.0000 meq | Freq: Once | INTRAVENOUS | Status: AC
  Administered 2019-09-05: 50 meq via INTRAVENOUS
  Filled 2019-09-05: qty 50

## 2019-09-05 MED ORDER — DEXTROSE 50 % IV SOLN
INTRAVENOUS | Status: AC
  Administered 2019-09-05: 50 mL
  Filled 2019-09-05: qty 50

## 2019-09-05 MED ORDER — STERILE WATER FOR INJECTION IV SOLN
INTRAVENOUS | Status: DC
  Filled 2019-09-05 (×4): qty 150

## 2019-09-05 MED ORDER — SODIUM CHLORIDE 0.9 % IV SOLN: INTRAVENOUS | Status: DC

## 2019-09-05 MED ORDER — PHENYLEPHRINE CONCENTRATED 100MG/250ML (0.4 MG/ML) INFUSION SIMPLE
0.0000 ug/min | INTRAVENOUS | Status: DC
  Administered 2019-09-05: 300 ug/min via INTRAVENOUS
  Filled 2019-09-05 (×3): qty 250

## 2019-09-05 MED ORDER — SODIUM CHLORIDE 0.9 % IV SOLN
1.0000 g | Freq: Three times a day (TID) | INTRAVENOUS | Status: DC
  Administered 2019-09-05 (×2): 1 g via INTRAVENOUS
  Filled 2019-09-05 (×4): qty 1

## 2019-09-05 MED ORDER — VASOPRESSIN 20 UNIT/ML IV SOLN
0.0300 [IU]/min | INTRAVENOUS | Status: DC
  Administered 2019-09-05: 0.03 [IU]/min via INTRAVENOUS
  Filled 2019-09-05: qty 2

## 2019-09-05 MED ORDER — VANCOMYCIN HCL IN DEXTROSE 1-5 GM/200ML-% IV SOLN
1000.0000 mg | INTRAVENOUS | Status: DC
  Filled 2019-09-05: qty 200

## 2019-09-05 MED ORDER — SODIUM CHLORIDE 0.9% IV SOLUTION: Freq: Once | INTRAVENOUS | Status: AC

## 2019-09-05 MED ORDER — SODIUM CHLORIDE 0.9 % IV SOLN
4.0000 g | Freq: Once | INTRAVENOUS | Status: AC
  Administered 2019-09-05: 4 g via INTRAVENOUS
  Filled 2019-09-05: qty 40

## 2019-09-05 MED ORDER — VANCOMYCIN HCL IN DEXTROSE 1-5 GM/200ML-% IV SOLN
1000.0000 mg | Freq: Once | INTRAVENOUS | Status: AC
  Administered 2019-09-05: 1000 mg via INTRAVENOUS
  Filled 2019-09-05: qty 200

## 2019-09-05 MED ORDER — SODIUM CHLORIDE 0.9 % IV SOLN
200.0000 mg | INTRAVENOUS | Status: DC
  Administered 2019-09-05: 200 mg via INTRAVENOUS
  Filled 2019-09-05 (×2): qty 200

## 2019-09-05 MED ORDER — SODIUM BICARBONATE-DEXTROSE 150-5 MEQ/L-% IV SOLN
150.0000 meq | INTRAVENOUS | Status: DC
  Filled 2019-09-05: qty 1000

## 2019-09-05 MED ORDER — SODIUM CHLORIDE 0.9 % IV SOLN
100.0000 mg | INTRAVENOUS | Status: DC
  Filled 2019-09-05: qty 100

## 2019-09-06 LAB — TYPE AND SCREEN
ABO/RH(D): O POS
Antibody Screen: NEGATIVE
Unit division: 0
Unit division: 0

## 2019-09-06 LAB — BPAM RBC
Blood Product Expiration Date: 202106252359
Blood Product Expiration Date: 202106262359
ISSUE DATE / TIME: 202105301539
ISSUE DATE / TIME: 202105301539
Unit Type and Rh: 5100
Unit Type and Rh: 5100

## 2019-09-06 LAB — CBC
HCT: 29.7 % — ABNORMAL LOW (ref 36.0–46.0)
Hemoglobin: 9.3 g/dL — ABNORMAL LOW (ref 12.0–15.0)
MCH: 33.1 pg (ref 26.0–34.0)
MCHC: 31.3 g/dL (ref 30.0–36.0)
MCV: 105.7 fL — ABNORMAL HIGH (ref 80.0–100.0)
Platelets: 69 10*3/uL — ABNORMAL LOW (ref 150–400)
RBC: 2.81 MIL/uL — ABNORMAL LOW (ref 3.87–5.11)
RDW: 14.5 % (ref 11.5–15.5)
WBC: 18.8 10*3/uL — ABNORMAL HIGH (ref 4.0–10.5)
nRBC: 4.1 % — ABNORMAL HIGH (ref 0.0–0.2)

## 2019-09-06 SURGERY — LAPAROTOMY, EXPLORATORY
Anesthesia: General

## 2019-09-06 NOTE — Discharge Summary (Signed)
Physician Death Summary  Patient ID: [name] MRN: [id number] DOB/AGE: [birth year] 79 y.o.  Admit date: 08/14/2019 Discharge date: Sep 10, 2019  Admission Diagnoses: Small bowel obstruction  Discharge Diagnoses:  Multiorgan failure Septic shock Bowel perforation Small bowel obstruction Acute renal failure Fulminant hepatic failure  Discharged Condition: Deceased  Hospital Course:  79 year old with nonobstructive CAD, HLD, IBS, depression admitted with small bowel obstruction.  Failed conservative management and taken to exploratory laparotomy on 5/28 with findings of ischemic bowel with perforation underwent resection Left on the ventilator with open abdomen and transferred to ICU.  PCCM consulted for vent management  In the ICU she remains on pressors with worsening multiorgan failure.  Started CVVH on September 10, 2022.  She continued to deteriorate with liver failure, worsening shock requiring increasing pressors.  Family was able to visit and transitioned to DNR and then to comfort measures.  She passed away on 2022-09-10 with family at bedside.  Marshell Garfinkel MD [facility] Please see [email or web address] for pager details.  09/06/2019, 12:11 PM

## 2019-09-06 NOTE — Progress Notes (Signed)
Daughter, Bill Salinas, called with decline in patient's condition @ 2250. Family arrived at 07/09/2313 at patient's bedside. Time of death 09-15-2019 @ Jul 09, 2337. [employer] Family requested no post and not ME case per [place]. Mickel Baas Gleason PA at bedside to speak with family.

## 2019-09-07 LAB — SURGICAL PATHOLOGY

## 2019-09-07 NOTE — Progress Notes (Signed)
CRITICAL VALUE ALERT  Critical Value:  5.9 hemoglobin  Date & Time Notied:  09/20/2019 @1516   Provider Notified: Dr. Vaughan Browner notified   Orders Received/Actions taken: Orders received to transfuse 2 units of PRBCs.  Will continue to monitor patient.

## 2019-09-07 NOTE — Progress Notes (Signed)
eLink Physician-Brief Progress Note Patient Name: [name] DOB: 1940/08/03 MRN: [id number]   Date of Service  09/18/19  HPI/Events of Note  HR 20. Hypotensive. MOF. Very poor prognosis. Family arrived.  DNR.   eICU Interventions  - notified ground ICU team.      Intervention Category Minor Interventions: Communication with other healthcare providers and/or family  Elmer Sow 09-18-2019, 11:16 PM

## 2019-09-07 NOTE — Progress Notes (Signed)
Hypoglycemic Event  CBG: 49  Treatment: D50 50 mL (25 gm)  Symptoms: None  Follow-up CBG: Time:1240  CBG Result:213  Possible Reasons for Event: Inadequate meal intake  Comments/MD notified:    Walker Shadow

## 2019-09-07 NOTE — Progress Notes (Addendum)
eLink Physician-Brief Progress Note Patient Name: [name] DOB: [birth year] MRN: [id number]   Date of Service  09/08/19  HPI/Events of Note  Called now about increased pressor needs Levophed at 30 mic and phenylephrine at 300 mic Is on vent support at VT 280, RR 35, peep 5, Fio2 40 -- O2 sat is 100. ABG with Po2 205 No fever, MAP is 63, on CRRT and on amiodarone, propofol and fentanyl infusions Still some room with levophed titration  eICU Interventions  Try NS bolus x 1 Ph is above 7.23, bicarb is 18 on the blood gas Will send CBC CMP and lactate now (do not see a recent lactate) Is on meropenem for abdominal sepsis and open abdomen Will add vancomycin and micafungin May need a 3rd pressor  Asked RN to call me with labs Spoke with pharmacy to dose antibiotics Is also already on stress dose steroids  Discussed with bedside PCCM as well      Intervention Category Major Interventions: Shock - evaluation and management  Anabella Capshaw G Weston Kallman 09-08-19, 4:32 AM   6.10 am I had ordered 2 amps of bicarbonate last time as IV push This helped BP Phenylephrine is weaned down and current SBP has improved to 120s She is on d5LR at 125 cc/hour Will switch this to a bicarbonate infusion as well In addition, will also increase VT slightly to 330 (7 cc/kg IBW)  Repeat ABG at 7 am Have also asked RN to call renal team now and update them so that changes to CRRT can also be made Noted lactate of 7.2, will need to trend as I do not have prior levels to compare, repeat ordered for 7 am as well

## 2019-09-07 NOTE — Progress Notes (Signed)
Admit: 08/20/2019 LOS: 46  79 year old female with history of hyperlipidemia, coronary artery disease, IBS, fibromyalgia here with abdominal pain, small bowel obstruction, status post operative repair with ischemic colitis and now shock and renal failure.  Subjective:  Marland Kitchen Persistent shock overnight requiring norepinephrine, phenylephrine, vasopressin.  Worsening acidemia and hyperkalemia.  Antimicrobial agents added due to persistent shock.  CRRT adjusted today.  No plans for surgery today.  Possibly tomorrow.  05/29 0701 - 05/30 0700 In: 5659.5 [I.V.:4534.8; IV Piggyback:1124.7] Out: 780 [Urine:125; Drains:600]  Filed Weights   09/06/2019 2133 08/31/19 2114 09/04/19 2245  Weight: 58.4 kg 56.4 kg 59.1 kg    Scheduled Meds: . chlorhexidine gluconate (MEDLINE KIT)  15 mL Mouth Rinse BID  . Chlorhexidine Gluconate Cloth  6 each Topical Daily  . enoxaparin (LOVENOX) injection  30 mg Subcutaneous Q24H  . fentaNYL (SUBLIMAZE) injection  25 mcg Intravenous Once  . hydrocortisone sod succinate (SOLU-CORTEF) inj  50 mg Intravenous Q6H  . mouth rinse  15 mL Mouth Rinse 10 times per day  . mupirocin ointment  1 application Nasal BID  . pantoprazole (PROTONIX) IV  40 mg Intravenous Q24H  . sodium chloride flush  3 mL Intravenous Q12H  . Tafluprost (PF)  1 drop Both Eyes Daily   Continuous Infusions: . sodium chloride Stopped (2019-09-15 1028)  . amiodarone 30 mg/hr (09-15-2019 1200)  . [START ON 09/06/2019] anidulafungin    . anidulafungin Stopped (15-Sep-2019 5427)  . calcium gluconate 4 g (2019/09/15 1310)  . fentaNYL infusion INTRAVENOUS 100 mcg/hr (2019-09-15 1200)  . lactated ringers Stopped (2019-09-15 0900)  . lactated ringers 75 mL/hr at 2019-09-15 1217  . meropenem (MERREM) IV    . norepinephrine (LEVOPHED) Adult infusion 34 mcg/min (09-15-19 1200)  . phenylephrine (NEO-SYNEPHRINE) Adult infusion 250 mcg/min (09/15/2019 0800)  . prismasol BGK 2/2.5 dialysis solution 1,500 mL/hr at 2019-09-15 1045  .  prismasol BGK 2/2.5 replacement solution 500 mL/hr at 15-Sep-2019 1047  . propofol (DIPRIVAN) infusion 15 mcg/kg/min (09-15-19 1200)  . sodium bicarbonate (isotonic) 1000 mL infusion 125 mL/hr at 09-15-19 1231  . [START ON 09/06/2019] vancomycin    . vasopressin (PITRESSIN) infusion - *FOR SHOCK* 0.03 Units/min (Sep 15, 2019 1200)   PRN Meds:.acetaminophen **OR** acetaminophen, diphenhydrAMINE, fentaNYL, fentaNYL (SUBLIMAZE) injection, fluticasone, heparin, midazolam, midazolam, ondansetron (ZOFRAN) IV, phenol, prochlorperazine, sodium chloride  Current Labs: reviewed    Physical Exam:  Blood pressure (!) 75/24, pulse 82, temperature (!) 90.1 F (32.3 C), resp. rate (!) 35, height '5\' 1"'  (1.549 m), weight 59.1 kg, SpO2 100 %. GEN: Ill-appearing, lying in bed, ventilated ENT: no nasal discharge, mmm, ET tube in place EYES: Eyes closed, no periorbital edema CV: tachycardia, irregularly irregular rhythm PULM: ventilated, coarse bilateral breath sounds ABD: Abdomen covered, no audible bowel sounds SKIN: no rashes or jaundice EXT: no edema,  feet cool  A 79 year old female with history of hyperlipidemia, coronary artery disease, IBS, fibromyalgia here with abdominal pain, small bowel obstruction, status post operative repair with ischemic colitis and now shock and renal failure.  P 1. Oliguric renal failure, severe: Normal baseline creatinine insignificant rising creatinine with corresponding falling urine output and electrolyte abnormalities with acidemia and hyperkalemia.    Continuing CRRT 1. Continue CRRT, bicarbonate added to post filter 2. All 2K bath, consider 0K if hyperkalemia is refractory 3. Continue aggressive hemodynamic support per primary team 4. Maintain even to positive 2. Acidemia: pH 7.28 now improved to 7.4 with ventilatory change in bicarbonate.    Ventilation per primary team.  Adjusting  CRRT as above 3. Hyperkalemia: Potassium improved to 5.3.  We will continue to monitor and  adjust CRRT as above if needed 4. Shock: Likely septic/vasogenic related to ischemic gut.  On antibiotics and added further antimicrobial coverage.  Primary team supporting blood pressure with pressors and hydrocortisone. 5. Atrial fibrillation with RVR: Primary team managing with IV amiodarone. 6. Small bowel obstruction with ischemia and perforation: Status post ex lap with resection.  Abdomen open.  Surgery following and managing.  Possible surgical intervention tomorrow but may be too unstable 7. Hypoxic respiratory failure: Postop status.  Primary team managing 8. 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  Marland Kitchen Medication Issues; o Preferred narcotic agents for pain control are hydromorphone, fentanyl, and methadone. Morphine should not be used.  o Baclofen should be avoided o Avoid oral sodium phosphate and magnesium citrate based laxatives / bowel preps    Santiago Bumpers, MD Sep 15, 2019, 1:22 PM  Recent Labs  Lab 09/04/19 0415 09/04/19 1000 09/04/19 1840 09/04/19 2006 09/04/19 2050 09/04/19 2151 Sep 15, 2019 0932 09-15-19 3557 09-15-2019 3220 Sep 15, 2019 0725 09/15/19 2542 Sep 15, 2019 7062 09/15/19 1218  NA 135   < > 137  --   --    < > 139   < > 138  --  140 139  --   K 5.2*   < > 6.4*   < >  --    < > 5.4*   < > 5.0   < > 4.5 4.3 5.3*  CL 105   < > 106  --   --   --  109  --  109  --   --   --   --   CO2 17*   < > 16*  --   --   --  15*  --  17*  --   --   --   --   GLUCOSE 97   < > 127*  --   --   --  191*  --  173*  --   --   --   --   BUN 45*   < > 55*  --   --   --  49*  --  37*  --   --   --   --   CREATININE 2.33*   < > 3.16*  --   --   --  2.91*  --  2.41*  --   --   --   --   CALCIUM 7.6*   < > 7.1*  --   --   --  5.8*  --  6.6*  --   --   --   --   PHOS 4.4  --   --   --  5.2*  --  5.6*  --   --   --   --   --   --    < > = values in this interval not displayed.   Recent Labs  Lab  09/01/19 0453 09/02/19 0317 09/04/19 2050 09/04/19 2151 September 15, 2019 0102 09/15/2019 0347 09-15-19 0426 2019/09/15 0822 09-15-19 0823  WBC 6.9   < > 13.1*  --  16.5*  --  16.3*  --   --   NEUTROABS 5.1  --  11.0*  --   --   --  7.0  --   --   HGB 13.9   < > 7.9*   < >  7.4*   < > 7.6* 8.8* 6.8*  HCT 41.6   < > 25.1*   < > 24.1*   < > 24.8* 26.0* 20.0*  MCV 102.5*   < > 107.7*  --  110.0*  --  110.7*  --   --   PLT 215   < > 129*  --  138*  --  118*  --   --    < > = values in this interval not displayed.    Plan communicated to the primary team

## 2019-09-07 NOTE — Progress Notes (Signed)
Pharmacy Antibiotic Note  [name] is a 79 y.o. female admitted on 08/15/2019 with sepsis.  She was found to have a SBO and is s/p ex-lap on 5/28 currently wih an open abdomen and plans to possibly return to OR on 5/31. Overnight on 5/30 she declined significantly requiring increased pressor support with norepinephrine, phenylephrine and vasopressin. At this time anidulafungin and vancomycin were added to meropenem. She is now starting CRRT and her abx will be adjusted accordingly.    CRRT effluent rate currently 36 mL/kg/hr  Plan: Anidulafungin 200 mg IV x 1 then 100 mg IV q24 hours Vancomycin 1gm IV q24h Meroepenem 1g q8h  Height: 5\' 1"  (154.9 cm) Weight: 59.1 kg (130 lb 4.7 oz) IBW/kg (Calculated) : 47.8  Temp (24hrs), Avg:92.2 F (33.4 C), Min:89.6 F (32 C), Max:98.5 F (36.9 C)  Recent Labs  Lab 08/13/2019 1630 09/04/2019 2216 09/04/19 0415 09/04/19 1000 09/04/19 1840 09/04/19 2050 09-12-2019 0102 09/12/19 0323 September 12, 2019 0426 09/12/19 0725  WBC 5.7  --  12.4*  --   --  13.1* 16.5*  --  16.3*  --   CREATININE 1.80*   < > 2.33* 2.60* 3.16*  --   --  2.91* 2.41*  --   LATICACIDVEN  --   --   --   --   --   --   --   --  7.2* 9.0*   < > = values in this interval not displayed.    Estimated Creatinine Clearance: 15.9 mL/min (A) (by C-G formula based on SCr of 2.41 mg/dL (H)).    Allergies  Allergen Reactions  . Amphetamine-Dextroamphet Er Other (See Comments)    rash  . Hydromorphone Shortness Of Breath    BREATHING AND CARDIAC ISSUES  . Metronidazole Other (See Comments)    Unknown REACTION: SORES IN MOUTH, severe diarrhea  . Penicillins     RASH  . Thorazine [Chlorpromazine] Other (See Comments)    " had a total hepatic wipeout" in 1974  . Bupivacaine Other (See Comments)    Unknown  . Fentanyl Other (See Comments)    Mental issues  . Other Other (See Comments)    perservatives- GI problems; swelling; blood infection  . Atorvastatin Other (See Comments)     Upset stomach  . Azithromycin     Unknown reaction  . Brinzolamide-Brimonidine Dermatitis and Other (See Comments)    Shortness of Breath, irritation   . Cephalexin     Unknown reaction  . Chlorpromazine Hcl     Unknown reaction  . Ciprofloxacin     REACTION: SEVERE PAIN,DEHYDRATION,DIARRHEA  . Diamox [Acetazolamide] Other (See Comments)    Causes HBP  . Doxycycline Diarrhea  . Ezetimibe     REACTION: JOINT/MUSCLE ACHES  . Gentian Violet Other (See Comments)  . Hyoscyamine Sulfate     Unknown reaction  . Lactose Intolerance (Gi) Diarrhea  . Lidocaine Swelling  . Oatmeal Other (See Comments)     Sinus Congestion   . Sulfamethoxazole     REACTION: GI upset-acid reflux  . Telithromycin     Unknown reaction  . Topiramate     Unknown reaction  . Lactose Diarrhea  . Latex Rash    Thank you for allowing pharmacy to be a part of this patient's care.  Phillis Haggis 09/12/19 9:23 AM

## 2019-09-07 NOTE — Significant Event (Signed)
PCCM interval progress note:   Continued worsening clinical status overnight, family at the bedside (two daughters and husband) and decided to transition to comfort care.   Pt declined rapidly and passed at 2339.   Otilio Carpen Dimple Bastyr, PA-C

## 2019-09-07 NOTE — Progress Notes (Signed)
[facility] Progress Note Patient Name: [name] DOB: April 26, 1940 MRN: [id number]   Date of Service  09/30/2019  HPI/Events of Note  Loletha Grayer, art line SBP up and down, not tracking. Clamped/mottled in MOF. On 2 pressors. DNR. SBO. On CRRT-bicarb gtt. On amiodarone. Low temperature. Hypoglycemia earlier. SBO,s/p exploratory laparotomy with resection of 40 cm of frankly necrtotic terminal ileum and part of right colon - Dr. Georgette Dover 09/02/2019  eICU Interventions  - bear hugger - hold amiodarone for 2 hrs or so.  - poor prognosis.  Discussed with bed side RN. DNR status. Family aware of very poor prognosis.      Intervention Category Intermediate Interventions: Arrhythmia - evaluation and management  Elmer Sow 2019/09/30, 10:48 PM

## 2019-09-07 NOTE — Progress Notes (Signed)
NAME:  [name], MRN:  [id number], DOB:  [birth year], LOS: 6 ADMISSION DATE:  08/29/2019, CONSULTATION DATE:  08/28/2019 REFERRING MD: Jonny Ruiz MD, CHIEF COMPLAINT: Small bowel obstruction s/p ex lap, vent management  Brief History   79 year old with nonobstructive CAD, HLD, IBS, depression admitted with small bowel obstruction.  Failed conservative management and taken to exploratory laparotomy today with findings of ischemic bowel with perforation underwent resection Left on the ventilator with open abdomen and transferred to ICU.  PCCM consulted for vent management  Past Medical History    has a past medical history of Abdominal bruit, Abnormal weight gain, Allergic rhinitis, cause unspecified, CAD (coronary artery disease), Colon polyp (0000000), Complication of anesthesia, Depression, Diverticular disease, Fibromyalgia, GERD (gastroesophageal reflux disease), Hyperlipemia, IBS (irritable bowel syndrome), Migraine, unspecified, without mention of intractable migraine without mention of status migrainosus, Tremor, Unspecified adverse effect of unspecified drug, medicinal and biological substance, and Ventral incisional hernia (06/04/2015).  Significant Hospital Events   5/24- Admit 5/28- Ex lap, bowel resection. To ICU on vent, pressors, open abdomen 5/30 Worsening multiorgan failure, Started CVVH  Consults:  Surgery  Procedures:  OETT 5/28 >>  Rt IJ CVL 5/28 >> Lt radial a line 5/28 >>  Significant Diagnostic Tests:  CT abdomen pelvis 08/28/2019-mid small bowel obstruction with 2 transition zones.  Visualized lung bases clear. I have reviewed the images personally  Echocardiogram 09/02/2019-LVEF 0000000, grade 1 diastolic dysfunction, mildly elevated PA systolic pressure.  Micro Data:  Blood culture 5/28  Antimicrobials:  Cefazolin 5/28 surgical prophylaxis Meropenem 5/28 >> Vanco 5/30 >> Anidulafungin 5/30 >>  Interim history/subjective:   Decompensated overnight. Now on  levo and neo. Vaso off due to concern for bowel ischemia.  Has become progressively acidotic and started CVVH. Added Vanco, eraxis On amio now for new onset a fib  Objective   Blood pressure 108/71, pulse 85, temperature (!) 91 F (32.8 C), resp. rate (!) 35, height 5\' 1"  (1.549 m), weight 59.1 kg, SpO2 100 %. CVP:  [14 mmHg] 14 mmHg  Vent Mode: PRVC FiO2 (%):  [40 %] 40 % Set Rate:  [35 bmp] 35 bmp Vt Set:  [280 mL-330 mL] 330 mL PEEP:  [5 cmH20] 5 cmH20 Plateau Pressure:  [14 cmH20-16 cmH20] 15 cmH20   Intake/Output Summary (Last 24 hours) at 29-Sep-2019 0748 Last data filed at 09/29/19 0700 Gross per 24 hour  Intake 5659.49 ml  Output 780 ml  Net 4879.49 ml   Filed Weights   08/24/2019 2133 08/31/19 2114 09/04/19 2245  Weight: 58.4 kg 56.4 kg 59.1 kg    Examination: Gen:      No acute distress HEENT:  EOMI, sclera anicteric Neck:     No masses; no thyromegaly, ETT Lungs:    Clear to auscultation bilaterally; normal respiratory effort CV:         Regular rate and rhythm; no murmurs Abd:      Wound vac Ext:    No edema; adequate peripheral perfusion Skin:      Warm and dry; no rash Neuro: Sedated, unresponsive  Labs significant for  Potassium 5, BUN/creatinine 27/2.412401, ALT 637, lactic acid 7.2 WBC 16.3, hemoglobin 10.6, platelets 118 INR 3 Chest x-ray 5/29-stable support apparatus. Lungs are clear.  Resolved Hospital Problem list     Assessment & Plan:  Acute small bowel obstruction with ischemia, perforation S/p ex lap and resection Remains on the ventilator with open abdomen Continue meropenem. Added vanco, eraxis Follow cultures Discussed with Dr. Rosendo Gros  surgery today. There is no clinical concern for ongoing bowel ischemia. Continue supportive care. Plan to come back to the OR when she is more stable  Acute hypoxic respiratory failure secondary to bowel ischemia, postop status Continue vent support Low tidal volume ventilation 6cc/kg. At risk for  ARDS Keep well sedated No plans on weaning until abdomen is closed. Repeat ABG  Septic shock, multiorgan failure Worsening clinical status overnight Continue levo, neo- Follow lactic acid Additional bolus of IV fluids  AKI, hyperkalemia, metabolic acidosis Started CRRT  Goals of care Discussion with her daughter who is the POA Prognosis is poor especially now with multiorgan failure. Continue supportive care. Daughter states that she has a living will which states DNR  Best practice:  Diet: NPO Pain/Anxiety/Delirium protocol (if indicated): Fentanyl, propofol, versed PRN, RASS goal - 4 VAP protocol (if indicated): Ordered DVT prophylaxis: Lovenox GI prophylaxis: PPI Glucose control: Monitor Mobility: Bed Code Status: DNR Family Communication: Daughter updated Disposition: ICU  Critical care time:    The patient is critically ill with multiple organ system failure and requires high complexity decision making for assessment and support, frequent evaluation and titration of therapies, advanced monitoring, review of radiographic studies and interpretation of complex data.   Critical Care Time devoted to patient care services, exclusive of separately billable procedures, described in this note is 35 minutes.   Marshell Garfinkel MD [facility] Please see [email or web address] for pager details.  09/09/19, 8:19 AM

## 2019-09-07 NOTE — Progress Notes (Signed)
Hypoglycemic Event  CBG: 49  Treatment: D50 50 mL (25 gm)  Symptoms: None  Follow-up CBG: Time:1700 CBG Result:132  Possible Reasons for Event: Inadequate meal intake  Comments/MD notified:    Walker Shadow

## 2019-09-07 NOTE — Progress Notes (Signed)
Pharmacy Antibiotic Note  [name] is a 79 y.o. female admitted on 08/21/2019 with sepsis.  Pharmacy has been consulted for vancomycin and micafungin dosing.Pharmacy will substitute anidulafungin.  Plan: Anidulafungin 200 mg IV x 1 then 100 mg IV q24 hours Vancomycin 1gm IV X 1 then dose based on renal function. F/u cultures and clincial course  Height: 5\' 1"  (154.9 cm) Weight: 59.1 kg (130 lb 4.7 oz) IBW/kg (Calculated) : 47.8  Temp (24hrs), Avg:98 F (36.7 C), Min:97.6 F (36.4 C), Max:98.5 F (36.9 C)  Recent Labs  Lab 08/10/2019 0353 09/01/2019 1252 08/15/2019 1630 08/21/2019 2216 09/04/19 0415 09/04/19 1000 09/04/19 1840 09/04/19 2050 2019-09-09 0102  WBC 5.5  --  5.7  --  12.4*  --   --  13.1* 16.5*  CREATININE 0.75   < > 1.80* 2.14* 2.33* 2.60* 3.16*  --   --    < > = values in this interval not displayed.    Estimated Creatinine Clearance: 12.1 mL/min (A) (by C-G formula based on SCr of 3.16 mg/dL (H)).    Allergies  Allergen Reactions  . Amphetamine-Dextroamphet Er Other (See Comments)    rash  . Hydromorphone Shortness Of Breath    BREATHING AND CARDIAC ISSUES  . Metronidazole Other (See Comments)    Unknown REACTION: SORES IN MOUTH, severe diarrhea  . Penicillins     RASH  . Thorazine [Chlorpromazine] Other (See Comments)    " had a total hepatic wipeout" in 1974  . Bupivacaine Other (See Comments)    Unknown  . Fentanyl Other (See Comments)    Mental issues  . Other Other (See Comments)    perservatives- GI problems; swelling; blood infection  . Atorvastatin Other (See Comments)    Upset stomach  . Azithromycin     Unknown reaction  . Brinzolamide-Brimonidine Dermatitis and Other (See Comments)    Shortness of Breath, irritation   . Cephalexin     Unknown reaction  . Chlorpromazine Hcl     Unknown reaction  . Ciprofloxacin     REACTION: SEVERE PAIN,DEHYDRATION,DIARRHEA  . Diamox [Acetazolamide] Other (See Comments)    Causes HBP  . Doxycycline  Diarrhea  . Ezetimibe     REACTION: JOINT/MUSCLE ACHES  . Gentian Violet Other (See Comments)  . Hyoscyamine Sulfate     Unknown reaction  . Lactose Intolerance (Gi) Diarrhea  . Lidocaine Swelling  . Oatmeal Other (See Comments)     Sinus Congestion   . Sulfamethoxazole     REACTION: GI upset-acid reflux  . Telithromycin     Unknown reaction  . Topiramate     Unknown reaction  . Lactose Diarrhea  . Latex Rash    Thank you for allowing pharmacy to be a part of this patient's care.  Excell Seltzer Poteet 09/09/2019 4:38 AM

## 2019-09-07 NOTE — Progress Notes (Signed)
Pt had a heart rhythm change and Heart rate increased. EKG obtain and MD notified and gave orders. Will continue to monitor pt.

## 2019-09-07 NOTE — Progress Notes (Signed)
2 Days Post-Op   Subjective/Chief Complaint: Req CRRT overnight con't to in critical condition   Objective: Vital signs in last 24 hours: Temp:  [89.6 F (32 C)-98.5 F (36.9 C)] 91 F (32.8 C) (05/30 0900) Pulse Rate:  [37-141] 88 (05/30 0900) Resp:  [17-36] 35 (05/30 0900) BP: (76-133)/(51-113) 115/75 (05/30 0800) SpO2:  [50 %-100 %] 100 % (05/30 0900) Arterial Line BP: (77-137)/(40-77) 116/64 (05/30 0900) FiO2 (%):  [40 %] 40 % (05/30 0800) Weight:  [59.1 kg] 59.1 kg (05/29 2245) Last BM Date: 09-20-19  Intake/Output from previous day: 05/29 0701 - 05/30 0700 In: 5659.5 [I.V.:4534.8; IV Piggyback:1124.7] Out: 780 [Urine:125; Drains:600] Intake/Output this shift: Total I/O In: 1248.9 [I.V.:479.6; IV Piggyback:769.4] Out: 24 [Other:24]  PE: Gen: Intubated, sedated Abd - minimal distention; hypoactive bowel sounds; NG functioning VAC with good seal  Lab Results:  Recent Labs    09-20-2019 0102 2019-09-20 0347 2019/09/20 0426 09/20/19 0426 09-20-2019 0822 09/20/2019 0823  WBC 16.5*  --  16.3*  --   --   --   HGB 7.4*   < > 7.6*   < > 8.8* 6.8*  HCT 24.1*   < > 24.8*   < > 26.0* 20.0*  PLT 138*  --  118*  --   --   --    < > = values in this interval not displayed.   BMET Recent Labs    2019/09/20 0323 09-20-2019 0333 09-20-19 0426 2019/09/20 0725 Sep 20, 2019 0822 2019-09-20 0823  NA 139   < > 138   < > 140 139  K 5.4*   < > 5.0   < > 4.5 4.3  CL 109  --  109  --   --   --   CO2 15*  --  17*  --   --   --   GLUCOSE 191*  --  173*  --   --   --   BUN 49*  --  37*  --   --   --   CREATININE 2.91*  --  2.41*  --   --   --   CALCIUM 5.8*  --  6.6*  --   --   --    < > = values in this interval not displayed.   PT/INR Recent Labs    09/04/19 2050  LABPROT 30.2*  INR 3.0*   ABG Recent Labs    09-20-19 0347 09-20-2019 0347 2019-09-20 0822 September 20, 2019 0823  PHART 7.238*  --   --  7.405  HCO3 17.1*   < > 19.0* 17.4*   < > = values in this interval not displayed.     Studies/Results: DG CHEST PORT 1 VIEW  Result Date: 09/04/2019 CLINICAL DATA:  79 year old female with central line placement. EXAM: PORTABLE CHEST 1 VIEW COMPARISON:  Chest radiograph dated 08/12/2019. FINDINGS: Interval placement of a left IJ central venous line with tip at the cavoatrial junction. No pneumothorax. No other interval change. The side port of the enteric tube is at the GE junction similar to prior radiograph. Recommend further advancing by an additional 10 cm. IMPRESSION: 1. Interval placement of a left IJ central venous line with tip at the cavoatrial junction. No pneumothorax. 2. Enteric tube with side port at the GE junction. Recommend further advancing by an additional 10 cm. Electronically Signed   By: Anner Crete M.D.   On: 09/04/2019 22:48   DG CHEST PORT 1 VIEW  Result Date: 08/30/2019 CLINICAL DATA:  Intubation. EXAM:  PORTABLE CHEST 1 VIEW COMPARISON:  Chest x-ray 08/20/2019. FINDINGS: Endotracheal tube tip noted 3 cm above the carina. Right IJ line noted with tip at cavoatrial junction. NG tube noted with tip below left hemidiaphragm. Side hole is at the gastroesophageal junction. Advancement of the NG tube approximately 10 cm should be considered. Heart size normal. Lungs are clear. No pleural effusion or pneumothorax. IMPRESSION: 1. NG tube noted with side hole at the gastroesophageal junction, advancement of approximately 10 cm. 2.  Endotracheal tube and right IJ line in good anatomic position. 3.  No acute cardiopulmonary disease identified. Electronically Signed   By: Marcello Moores  Register   On: 09/04/2019 13:49   DG Abd Portable 1V  Result Date: 09/13/2019 CLINICAL DATA:  NG tube placement EXAM: PORTABLE ABDOMEN - 1 VIEW COMPARISON:  None. FINDINGS: Support Apparatus: --Endotracheal tube: Tip at the level of the clavicular heads. --Enteric tube:Tip and sideport project over the stomach. --Catheter(s):Right internal jugular vein approach central venous catheter tip is  at the lower SVC. Left IJ approach central venous catheter tip is at the cavoatrial junction. --Other: None The heart size and mediastinal contours are within normal limits. The lungs are clear. No pleural effusion or pneumothorax. IMPRESSION: NG tube tip and sideport project within the stomach. Electronically Signed   By: Ulyses Jarred M.D.   On: 09-13-2019 06:54   DG Abd Portable 1V  Result Date: 08/28/2019 CLINICAL DATA:  NG tube placement EXAM: PORTABLE ABDOMEN - 1 VIEW COMPARISON:  09/02/2019 FINDINGS: Esophageal tube tip overlies the proximal stomach, side-port in the region of distal esophagus. Partially visualized dilated loops of small bowel suspicious for obstruction. IMPRESSION: Esophageal tube side-port in the region of distal esophagus, consider further advancement for more optimal positioning. Electronically Signed   By: Donavan Foil M.D.   On: 08/22/2019 16:31    Anti-infectives: Anti-infectives (From admission, onward)   Start     Dose/Rate Route Frequency Ordered Stop   09/06/19 0600  anidulafungin (ERAXIS) 100 mg in sodium chloride 0.9 % 100 mL IVPB     100 mg 78 mL/hr over 100 Minutes Intravenous Every 24 hours 09-13-19 0436     09/06/19 0500  vancomycin (VANCOCIN) IVPB 1000 mg/200 mL premix     1,000 mg 200 mL/hr over 60 Minutes Intravenous Every 24 hours 09-13-2019 0938     09-13-19 1400  meropenem (MERREM) 1 g in sodium chloride 0.9 % 100 mL IVPB     1 g 200 mL/hr over 30 Minutes Intravenous Every 8 hours 09/13/19 0938     09/13/2019 0530  anidulafungin (ERAXIS) 200 mg in sodium chloride 0.9 % 200 mL IVPB     200 mg 78 mL/hr over 200 Minutes Intravenous Every 24 hours 13-Sep-2019 0432     13-Sep-2019 0530  vancomycin (VANCOCIN) IVPB 1000 mg/200 mL premix     1,000 mg 200 mL/hr over 60 Minutes Intravenous  Once September 13, 2019 0432 09/13/19 0555   Sep 13, 2019 0436  vancomycin variable dose per unstable renal function (pharmacist dosing)  Status:  Discontinued      Does not apply See admin  instructions 2019/09/13 0437 09-13-19 0938   09/04/19 0230  meropenem (MERREM) 1 g in sodium chloride 0.9 % 100 mL IVPB  Status:  Discontinued     1 g 200 mL/hr over 30 Minutes Intravenous Every 12 hours 09/06/2019 2147 Sep 13, 2019 0939   08/11/2019 1400  meropenem (MERREM) 2 g in sodium chloride 0.9 % 100 mL IVPB  Status:  Discontinued  2 g 200 mL/hr over 30 Minutes Intravenous Every 12 hours 08/15/2019 1220 08/15/2019 2147   08/25/2019 0940  ceFAZolin (ANCEF) 2-4 GM/100ML-% IVPB    Note to Pharmacy: Grace Blight   : cabinet override      08/26/2019 0940 08/13/2019 1100   08/22/2019 0600  ceFAZolin (ANCEF) IVPB 2g/100 mL premix     2 g 200 mL/hr over 30 Minutes Intravenous On call to O.R. 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

## 2019-09-07 DEATH — deceased

## 2019-09-08 LAB — CULTURE, BLOOD (ROUTINE X 2)
Culture: NO GROWTH
Culture: NO GROWTH
Special Requests: ADEQUATE

## 2019-09-13 ENCOUNTER — Ambulatory Visit: Payer: Medicare Other | Admitting: Psychiatry

## 2019-09-14 ENCOUNTER — Ambulatory Visit: Payer: Medicare Other | Admitting: Psychiatry
